# Patient Record
Sex: Female | Born: 1950 | Race: White | Hispanic: No | Marital: Married | Smoking: Former smoker
Health system: Southern US, Community
[De-identification: ages and names within clinical notes are randomized; demographics above are authoritative.]

## PROBLEM LIST (undated history)

## (undated) DIAGNOSIS — K811 Chronic cholecystitis: Secondary | ICD-10-CM

## (undated) DIAGNOSIS — E041 Nontoxic single thyroid nodule: Secondary | ICD-10-CM

## (undated) DIAGNOSIS — I499 Cardiac arrhythmia, unspecified: Secondary | ICD-10-CM

## (undated) DIAGNOSIS — E039 Hypothyroidism, unspecified: Secondary | ICD-10-CM

## (undated) DIAGNOSIS — I10 Essential (primary) hypertension: Secondary | ICD-10-CM

## (undated) DIAGNOSIS — E785 Hyperlipidemia, unspecified: Secondary | ICD-10-CM

## (undated) DIAGNOSIS — R7303 Prediabetes: Secondary | ICD-10-CM

## (undated) DIAGNOSIS — M1990 Unspecified osteoarthritis, unspecified site: Secondary | ICD-10-CM

## (undated) HISTORY — DX: Hyperlipidemia, unspecified: E78.5

## (undated) HISTORY — DX: Prediabetes: R73.03

## (undated) HISTORY — PX: OTHER SURGICAL HISTORY: SHX169

## (undated) HISTORY — PX: TUBAL LIGATION: SHX77

## (undated) HISTORY — PX: NASAL SINUS SURGERY: SHX719

## (undated) HISTORY — DX: Nontoxic single thyroid nodule: E04.1

## (undated) HISTORY — DX: Essential (primary) hypertension: I10

## (undated) HISTORY — DX: Hypothyroidism, unspecified: E03.9

## (undated) HISTORY — PX: REPLACEMENT TOTAL KNEE BILATERAL: SUR1225

---

## 1998-05-06 ENCOUNTER — Ambulatory Visit (HOSPITAL_COMMUNITY): Admission: RE | Admit: 1998-05-06 | Discharge: 1998-05-06 | Payer: Self-pay | Admitting: Obstetrics and Gynecology

## 1999-09-15 ENCOUNTER — Ambulatory Visit (HOSPITAL_COMMUNITY): Admission: RE | Admit: 1999-09-15 | Discharge: 1999-09-15 | Payer: Self-pay | Admitting: *Deleted

## 1999-10-13 ENCOUNTER — Encounter: Payer: Self-pay | Admitting: Family Medicine

## 1999-10-13 ENCOUNTER — Encounter: Admission: RE | Admit: 1999-10-13 | Discharge: 1999-10-13 | Payer: Self-pay | Admitting: Family Medicine

## 2000-10-18 ENCOUNTER — Encounter: Payer: Self-pay | Admitting: Family Medicine

## 2000-10-18 ENCOUNTER — Encounter: Admission: RE | Admit: 2000-10-18 | Discharge: 2000-10-18 | Payer: Self-pay | Admitting: Family Medicine

## 2001-07-17 ENCOUNTER — Other Ambulatory Visit: Admission: RE | Admit: 2001-07-17 | Discharge: 2001-07-17 | Payer: Self-pay | Admitting: *Deleted

## 2001-12-04 ENCOUNTER — Encounter: Payer: Self-pay | Admitting: *Deleted

## 2001-12-04 ENCOUNTER — Encounter: Admission: RE | Admit: 2001-12-04 | Discharge: 2001-12-04 | Payer: Self-pay | Admitting: *Deleted

## 2002-11-15 ENCOUNTER — Encounter: Payer: Self-pay | Admitting: Emergency Medicine

## 2002-11-15 ENCOUNTER — Inpatient Hospital Stay (HOSPITAL_COMMUNITY): Admission: EM | Admit: 2002-11-15 | Discharge: 2002-11-18 | Payer: Self-pay | Admitting: Emergency Medicine

## 2002-11-16 ENCOUNTER — Encounter: Payer: Self-pay | Admitting: Internal Medicine

## 2003-02-25 ENCOUNTER — Encounter: Payer: Self-pay | Admitting: Family Medicine

## 2003-02-25 ENCOUNTER — Encounter: Admission: RE | Admit: 2003-02-25 | Discharge: 2003-02-25 | Payer: Self-pay | Admitting: Family Medicine

## 2003-03-05 ENCOUNTER — Ambulatory Visit (HOSPITAL_COMMUNITY): Admission: RE | Admit: 2003-03-05 | Discharge: 2003-03-05 | Payer: Self-pay

## 2003-09-07 ENCOUNTER — Inpatient Hospital Stay (HOSPITAL_COMMUNITY): Admission: EM | Admit: 2003-09-07 | Discharge: 2003-09-09 | Payer: Self-pay | Admitting: Emergency Medicine

## 2004-12-12 ENCOUNTER — Encounter: Admission: RE | Admit: 2004-12-12 | Discharge: 2004-12-12 | Payer: Self-pay | Admitting: Family Medicine

## 2005-01-12 ENCOUNTER — Encounter: Admission: RE | Admit: 2005-01-12 | Discharge: 2005-01-12 | Payer: Self-pay | Admitting: Family Medicine

## 2005-02-01 ENCOUNTER — Ambulatory Visit (HOSPITAL_COMMUNITY): Admission: RE | Admit: 2005-02-01 | Discharge: 2005-02-01 | Payer: Self-pay | Admitting: Family Medicine

## 2005-02-01 ENCOUNTER — Encounter (INDEPENDENT_AMBULATORY_CARE_PROVIDER_SITE_OTHER): Payer: Self-pay | Admitting: *Deleted

## 2005-07-17 ENCOUNTER — Ambulatory Visit (HOSPITAL_COMMUNITY): Admission: RE | Admit: 2005-07-17 | Discharge: 2005-07-17 | Payer: Self-pay | Admitting: Surgery

## 2006-02-05 ENCOUNTER — Encounter: Admission: RE | Admit: 2006-02-05 | Discharge: 2006-02-05 | Payer: Self-pay | Admitting: Internal Medicine

## 2006-08-08 ENCOUNTER — Other Ambulatory Visit: Admission: RE | Admit: 2006-08-08 | Discharge: 2006-08-08 | Payer: Self-pay | Admitting: Internal Medicine

## 2006-10-03 ENCOUNTER — Ambulatory Visit (HOSPITAL_COMMUNITY): Admission: RE | Admit: 2006-10-03 | Discharge: 2006-10-03 | Payer: Self-pay

## 2007-09-03 ENCOUNTER — Ambulatory Visit (HOSPITAL_COMMUNITY): Admission: RE | Admit: 2007-09-03 | Discharge: 2007-09-03 | Payer: Self-pay | Admitting: Internal Medicine

## 2007-12-15 ENCOUNTER — Emergency Department (HOSPITAL_COMMUNITY): Admission: EM | Admit: 2007-12-15 | Discharge: 2007-12-16 | Payer: Self-pay | Admitting: Emergency Medicine

## 2008-01-20 ENCOUNTER — Ambulatory Visit (HOSPITAL_COMMUNITY): Admission: RE | Admit: 2008-01-20 | Discharge: 2008-01-20 | Payer: Self-pay | Admitting: Internal Medicine

## 2008-02-03 ENCOUNTER — Encounter: Admission: RE | Admit: 2008-02-03 | Discharge: 2008-02-03 | Payer: Self-pay | Admitting: Internal Medicine

## 2008-04-12 IMAGING — US US SOFT TISSUE HEAD/NECK
1 series · 14 of 25 positions shown · non-contrast
Comparison: 01/21/2008 and earlier studies

CLINICAL DATA: Solitary right thyroid nodule, status post biopsy on
02/01/2005 with benign results.  Recent thyroid scintigraphy showed
cold defect corresponding to the lesion.

THYROID ULTRASOUND
TECHNIQUE: Ultrasound examination of the thyroid gland and adjacent
soft tissues was performed.

[Series 1: us soft tissue head/neck · 0.09mm/px · 14 of 44 slices shown]
[im 1/44]
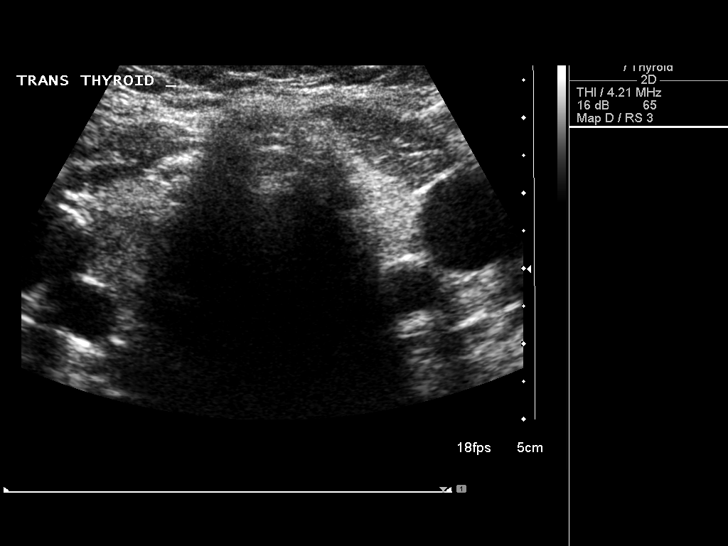
[im 4/44]
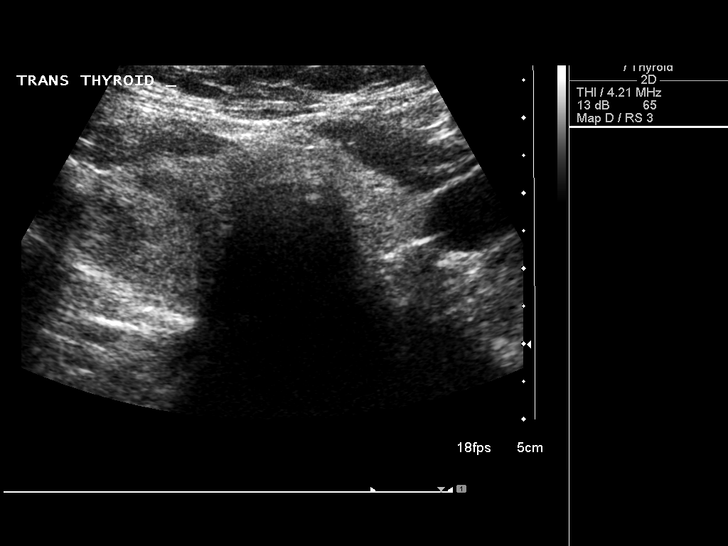
[im 8/44]
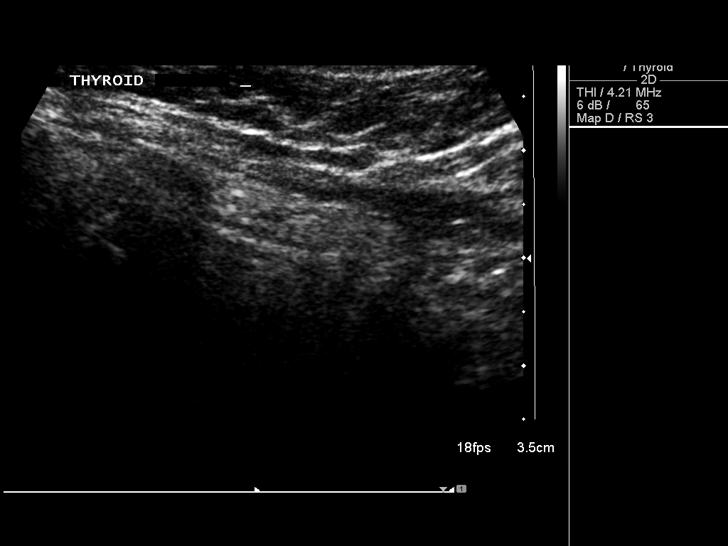
[im 11/44]
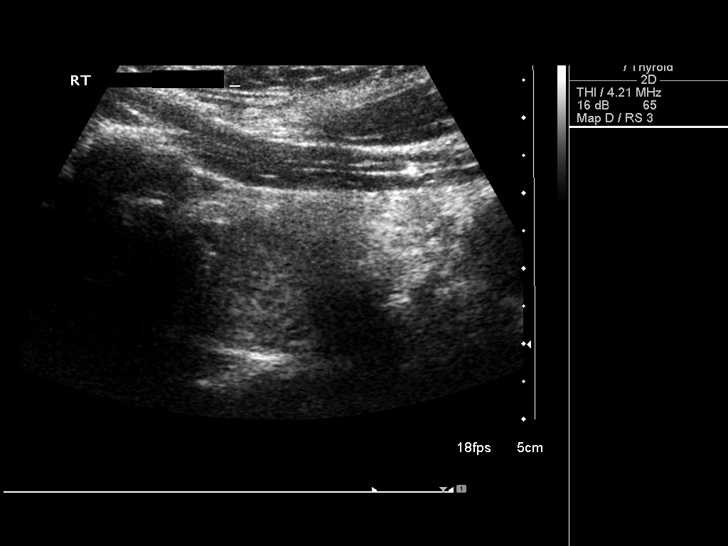
[im 15/44]
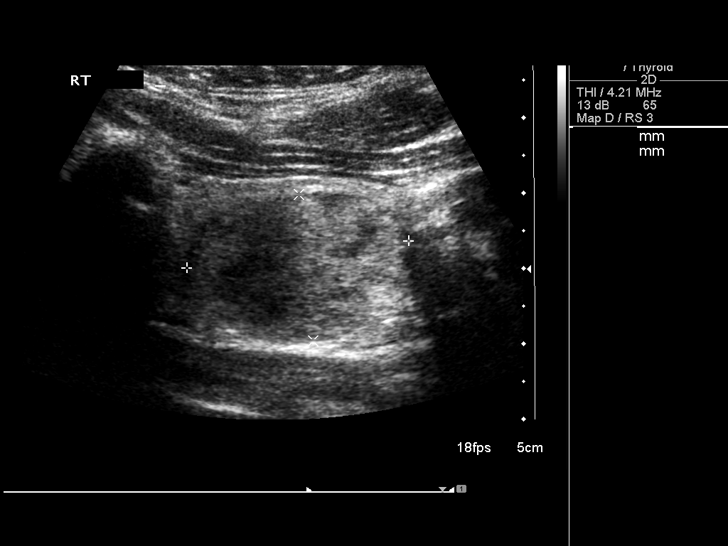
[im 17/44]
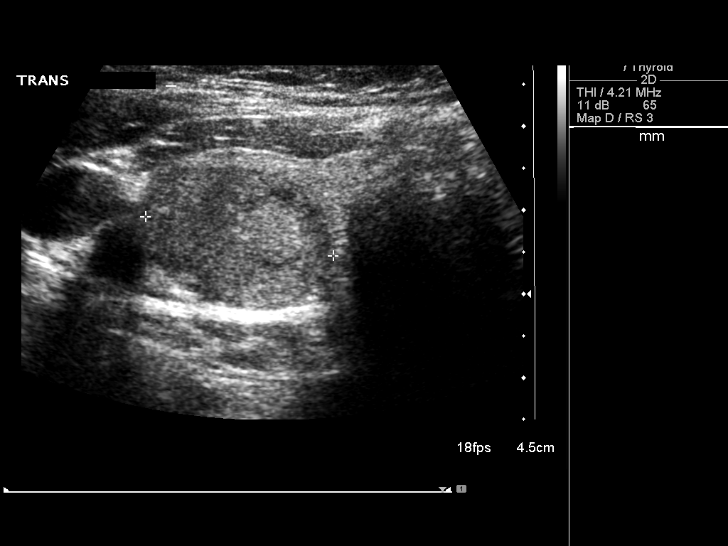
[im 20/44]
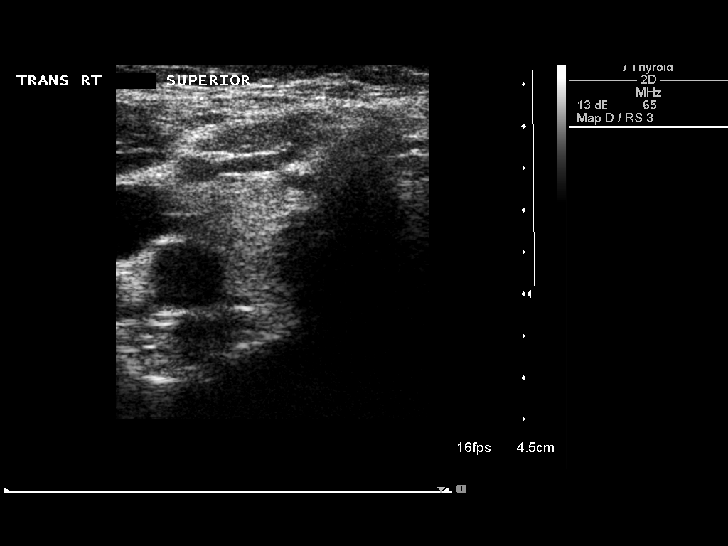
[im 24/44]
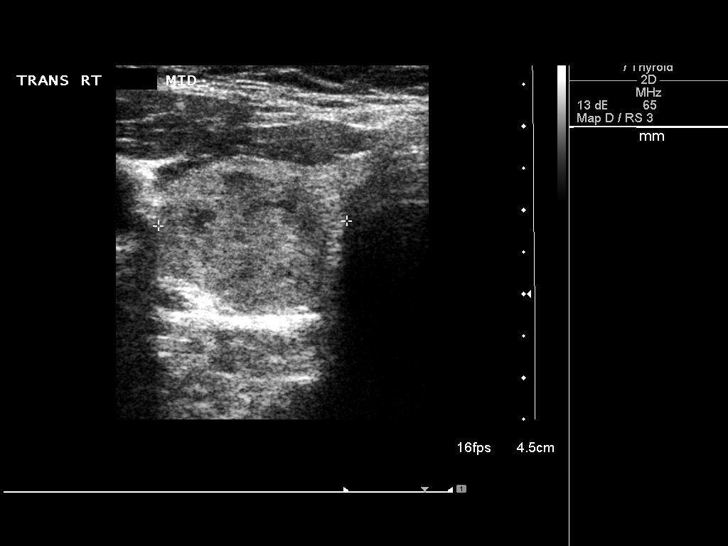
[im 27/44]
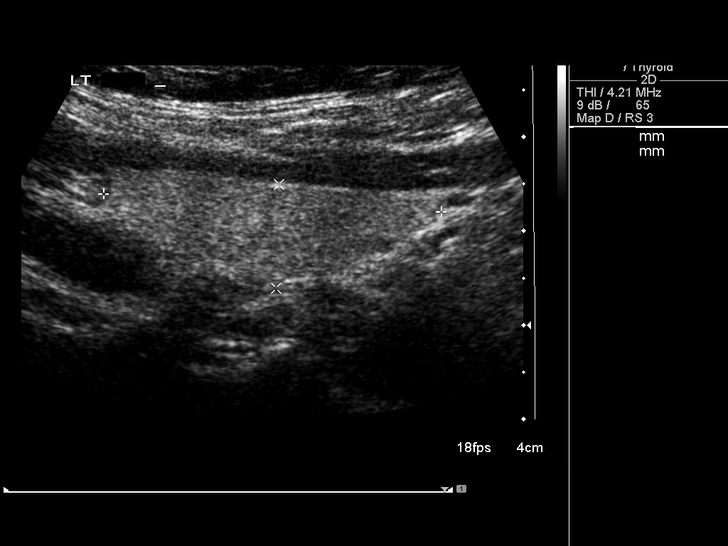
[im 29/44]
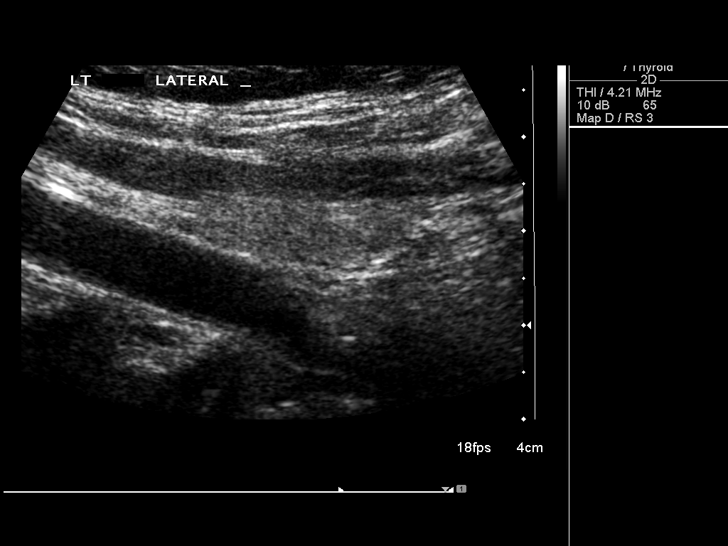
[im 33/44]
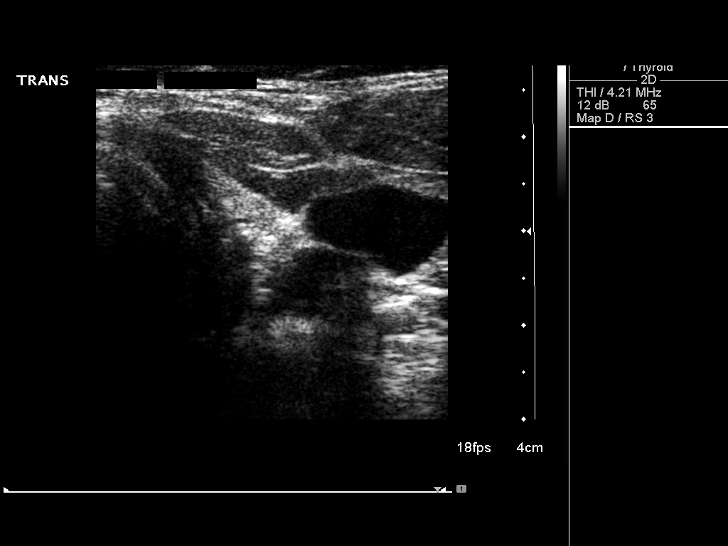
[im 36/44]
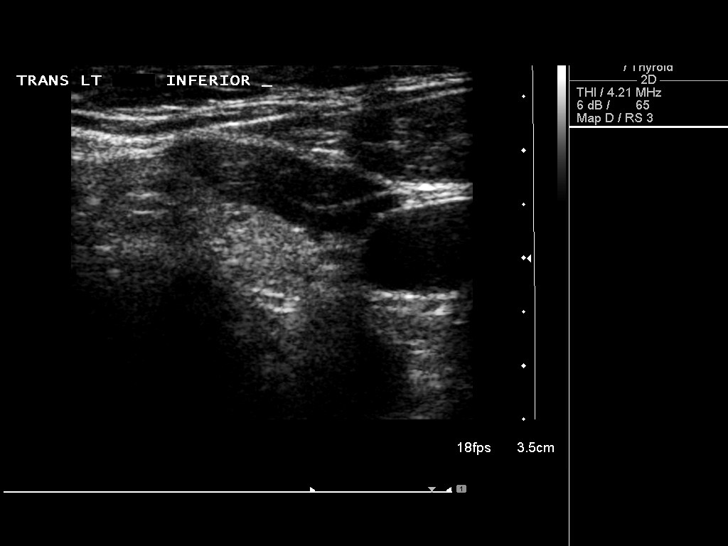
[im 40/44]
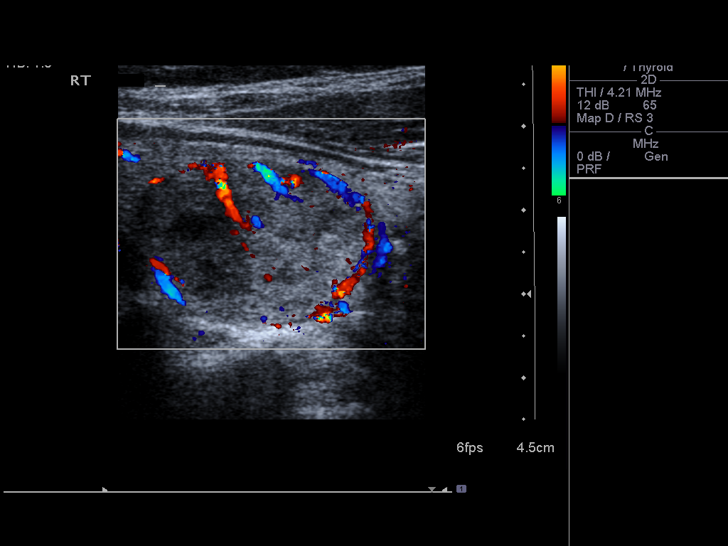
[im 44/44]
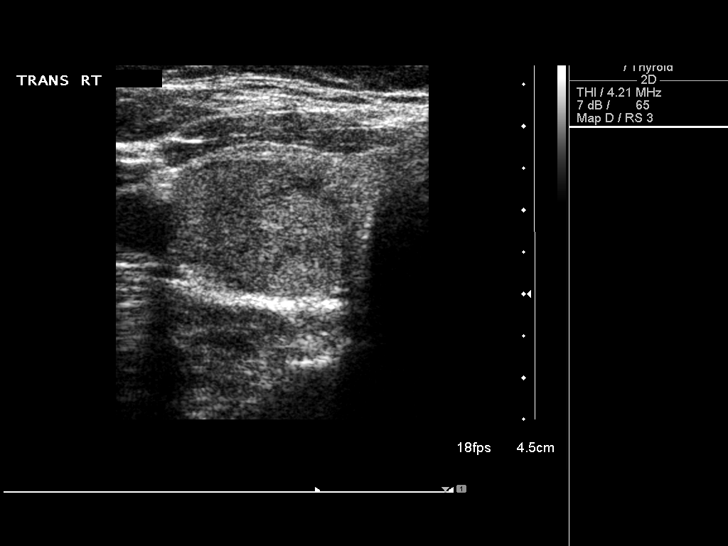

[14 of 25 positions shown; findings below may reference images not displayed]

FINDINGS: The right lobe measures 22 x 23 x 43 mm, with a
hypoechoic solid appearing 18 x 21 x 27 mm nodule in its inferior
pole. Left lobe 11 x 12 x 36 mm, homogeneous echotexture without
focal lesion.  Isthmus 3.7 mm in thickness.
IMPRESSION: 1.  2.7 cm right thyroid nodule, without increase in size since
previous studies.

## 2008-09-13 ENCOUNTER — Ambulatory Visit: Payer: Self-pay | Admitting: Cardiology

## 2008-09-27 ENCOUNTER — Ambulatory Visit: Payer: Self-pay

## 2008-09-27 ENCOUNTER — Encounter: Payer: Self-pay | Admitting: Cardiology

## 2008-11-18 ENCOUNTER — Inpatient Hospital Stay (HOSPITAL_COMMUNITY): Admission: RE | Admit: 2008-11-18 | Discharge: 2008-11-21 | Payer: Self-pay | Admitting: Orthopaedic Surgery

## 2009-01-09 ENCOUNTER — Emergency Department (HOSPITAL_COMMUNITY): Admission: EM | Admit: 2009-01-09 | Discharge: 2009-01-09 | Payer: Self-pay | Admitting: Emergency Medicine

## 2009-11-03 ENCOUNTER — Inpatient Hospital Stay (HOSPITAL_COMMUNITY): Admission: RE | Admit: 2009-11-03 | Discharge: 2009-11-06 | Payer: Self-pay | Admitting: Orthopaedic Surgery

## 2010-01-25 ENCOUNTER — Ambulatory Visit (HOSPITAL_COMMUNITY): Admission: RE | Admit: 2010-01-25 | Discharge: 2010-01-25 | Payer: Self-pay | Admitting: Internal Medicine

## 2010-09-26 ENCOUNTER — Ambulatory Visit: Payer: Self-pay | Admitting: Internal Medicine

## 2010-09-26 DIAGNOSIS — R51 Headache: Secondary | ICD-10-CM

## 2010-09-26 DIAGNOSIS — J328 Other chronic sinusitis: Secondary | ICD-10-CM

## 2010-09-26 DIAGNOSIS — R519 Headache, unspecified: Secondary | ICD-10-CM | POA: Insufficient documentation

## 2010-09-26 DIAGNOSIS — I10 Essential (primary) hypertension: Secondary | ICD-10-CM

## 2010-09-30 LAB — CONVERTED CEMR LAB: IgE (Immunoglobulin E), Serum: 37.8 intl units/mL (ref 0.0–180.0)

## 2010-11-27 ENCOUNTER — Ambulatory Visit (HOSPITAL_COMMUNITY)
Admission: RE | Admit: 2010-11-27 | Discharge: 2010-11-27 | Payer: Self-pay | Source: Home / Self Care | Attending: Internal Medicine | Admitting: Internal Medicine

## 2010-11-27 ENCOUNTER — Encounter: Payer: Self-pay | Admitting: Physician Assistant

## 2010-11-28 NOTE — Assessment & Plan Note (Signed)
Summary: allergy problem/ mbw   Vital Signs:  Patient profile:   60 year old female Height:      63.5 inches Weight:      258.13 pounds BMI:     45.17 O2 Sat:      96 % on Room air Pulse rate:   53 / minute BP sitting:   124 / 80  (left arm) Cuff size:   large  Vitals Entered By: Reynaldo Minium CMA (September 26, 2010 9:48 AM)  O2 Flow:  Room air CC: Allergy consult-Dr. Oneta Rack.   Primary Provider/Referring Provider:  Oneta Rack  CC:  Allergy consult-Dr. Oneta Rack.Marland Kitchen  History of Present Illness: September 26, 2010- 59 yoF seen on kind referral by Dr Oneta Rack about persistent sinus complaints. Remote smoking hx. Says she has had sinus problems x 30 years and she now wonders if there is an allergy mechanisim. Gets frontal pressure, light headed, frontal and maxillary ache, postnasal drip, some sneeze. Little discharge. Eyes get tender, ears ache. Symptoms flare approximately monthly, but not related to hormone cyccles and not seasonal. Denies colds and not much cough or sore throat. Has been given Z pak twice in last few weeks. Remote sinus surgery by Dr Arletha Grippe. No defined triggers. "Tough" of asthma in past, not treated. Now minor occasional wheeze or tightness. No pneumonia.  Preventive Screening-Counseling & Management  Alcohol-Tobacco     Smoking Status: quit     Packs/Day: 0.75     Year Quit: 1980  Current Medications (verified): 1)  Vitamin D3 50000 Unit Caps (Cholecalciferol) .... Take 1 By Mouth On S,m,w,f,s 2)  Methocarbamol 750 Mg Tabs (Methocarbamol) .... Take 1 By Mouth Every 6 Hours As Needed Spasm 3)  Percocet 5-325 Mg Tabs (Oxycodone-Acetaminophen) .... Take 1-2 By Mouth Every 4 Hours As Needed Pain 4)  Ziac 5-6.25 Mg Tabs (Bisoprolol-Hydrochlorothiazide) .... Take 1/2 By Mouth Once Daily 5)  Lisinopril-Hydrochlorothiazide 20-12.5 Mg Tabs (Lisinopril-Hydrochlorothiazide) .... Take 1/2 By Mouth Once Daily 6)  Pravastatin Sodium 40 Mg Tabs (Pravastatin Sodium) .... Take 1 By  Mouth Once Daily 7)  Levothyroxine Sodium 100 Mcg Tabs (Levothyroxine Sodium) .... Take 1/2 By Mouth S,t,w,f,s and 1 By Mouth M,th 8)  Antacid Extra Strength 750 Mg Chew (Calcium Carbonate Antacid) .... Take 2 By Mouth Once Daily 9)  Potassium Gluconate 595 Mg Cr-Tabs (Potassium Gluconate) .... Take 1 By Mouth Once Daily 10)  Fish Oil 1000 Mg Caps (Omega-3 Fatty Acids) .... Take 1 By Mouth Once Daily 11)  Tylenol Pm Extra Strength 500-25 Mg Tabs (Diphenhydramine-Apap (Sleep)) .... Take 1 By Mouth At Bedtime As Needed 12)  Hair/skin/nails  Tabs (Multiple Vitamins-Minerals) .... Take 1 By Mouth Once Daily  Allergies (verified): 1)  ! 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  The patient denies shortness of breath at rest, productive cough, non-productive cough, coughing  up blood, loss of appetite, weight change, abdominal pain, sore throat, tooth/dental problems, sneezing, itching, ear ache, anxiety, depression, hand/feet swelling, joint stiffness or pain, rash, and fever.    Physical Exam  Additional Exam:  General: A/Ox3; pleasant and cooperative, NAD, overweight SKIN: no rash, lesions NODES: no lymphadenopathy HEENT: Mier/AT, EOM- WNL, Conjuctivae- clear, PERRLA, TM-WNL, Nose- shiney, pale mucosa., Throat- clear and wnl, Mallampati  II, dentures NECK: Supple w/ fair ROM, JVD- none, normal carotid impulses w/o bruits Thyroid- normal to palpation CHEST: Clear to P&A HEART: RRR, no m/g/r heard ABDOMEN: Soft and nl; nml bowel sounds; no organomegaly or masses noted UJW:JXBJ, nl pulses, no edema  NEURO: Grossly intact to observation      Impression & Recommendations:  Problem # 1:  RHINOSINUSITIS, 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 blockage and an inflammatory cascade. We will send allergy profile for IgE assessment and bring her back for skin testing.   Medications Added to Medication List This Visit: 1)  Vitamin D3 50000 Unit Caps (Cholecalciferol) .... Take 1 by mouth on s,m,w,f,s 2)  Methocarbamol 750 Mg Tabs (Methocarbamol) .... Take 1 by mouth every 6 hours as needed spasm 3)  Percocet 5-325 Mg Tabs (Oxycodone-acetaminophen) .... Take 1-2 by mouth every 4 hours as needed pain 4)  Ziac 5-6.25 Mg Tabs (Bisoprolol-hydrochlorothiazide) .... Take 1/2 by mouth once daily 5)  Lisinopril-hydrochlorothiazide 20-12.5 Mg Tabs (Lisinopril-hydrochlorothiazide) .... Take 1/2 by mouth once daily 6)  Pravastatin Sodium 40 Mg Tabs (Pravastatin sodium) .... Take 1 by mouth once daily 7)  Levothyroxine Sodium 100 Mcg Tabs (Levothyroxine sodium) .... Take 1/2 by mouth s,t,w,f,s and 1 by mouth  m,th 8)  Antacid Extra Strength 750 Mg Chew (Calcium carbonate antacid) .... Take 2 by mouth once daily 9)  Potassium Gluconate 595 Mg Cr-tabs (Potassium gluconate) .... Take 1 by mouth once daily 10)  Fish Oil 1000 Mg Caps (Omega-3 fatty acids) .... Take 1 by mouth once daily 11)  Tylenol Pm Extra Strength 500-25 Mg Tabs (Diphenhydramine-apap (sleep)) .... Take 1 by mouth at bedtime as needed 12)  Hair/skin/nails Tabs (Multiple vitamins-minerals) .... Take 1 by mouth once daily  Other Orders: Consultation Level IV (47829) T-All[facility] Profile [facility], [place], [place], [place], [place] [phone])  Patient Instructions: 1)  Return as able for allergy skin testing- Stop all antihistamines 3 days before skin testing, including cold and allergy meds, otc sleep and cough meds.  2)  Lab   Orders Added: 1)  Consultation Level IV [30865] 2)  T-All[facility] Profile [facility] [place], [place], [place], [place] [[id number]]

## 2010-11-29 ENCOUNTER — Encounter: Payer: Self-pay | Admitting: Internal Medicine

## 2010-11-29 ENCOUNTER — Ambulatory Visit: Admit: 2010-11-29 | Payer: Self-pay | Admitting: Internal Medicine

## 2010-11-29 ENCOUNTER — Ambulatory Visit (INDEPENDENT_AMBULATORY_CARE_PROVIDER_SITE_OTHER): Payer: Federal, State, Local not specified - PPO | Admitting: Internal Medicine

## 2010-11-29 DIAGNOSIS — J328 Other chronic sinusitis: Secondary | ICD-10-CM

## 2010-11-29 DIAGNOSIS — J309 Allergic rhinitis, unspecified: Secondary | ICD-10-CM

## 2010-12-06 NOTE — Assessment & Plan Note (Signed)
Summary: alt/kp   Vital Signs:  Patient profile:   60 year old female Height:      63.5 inches Weight:      262 pounds BMI:     45.85 O2 Sat:      96 % on Room air Pulse rate:   62 / minute BP sitting:   118 / 70  (left arm) Cuff size:   large  Vitals Entered By: Reynaldo Minium CMA (November 29, 2010 2:35 PM)  O2 Flow:  Room air CC: Allergy Skin Testing.   Primary Provider/Referring Provider:  Oneta Rack  CC:  Allergy Skin Testing.Marland Kitchen  History of Present Illness: History of Present Illness: September 26, 2010- 59 yoF seen on kind referral by Dr Oneta Rack about persistent sinus complaints. Remote smoking hx. Says she has had sinus problems x 30 years and she now wonders if there is an allergy mechanisim. Gets frontal pressure, light headed, frontal and maxillary ache, postnasal drip, some sneeze. Little discharge. Eyes get tender, ears ache. Symptoms flare approximately monthly, but not related to hormone cycles and not seasonal. Denies colds and not much cough or sore throat. Has been given Z pak twice in last few weeks. Remote sinus surgery by Dr Arletha Grippe. No defined triggers. "Tough" asthma in past, not treated. Now minor occasional wheeze or tightness. No pneumonia.  November 29, 2010- rhinosinusitis, ? allergic rhinitis Nurse-CC: Allergy Skin Testing. Allergy profile- Total IgE 37.8, no specific elevations Recent antibitoics for pneumonia per Dr Oneta Rack. She denies fever, purulence, sore throat or productive cough. Has not been taking cough med.  Skin test- Positive for common inhalants.     Preventive Screening-Counseling & Management  Alcohol-Tobacco     Smoking Status: quit     Packs/Day: 0.75     Year Started: 1960     Year Quit: 1980  Current Medications (verified): 1)  Vitamin D3 50000 Unit Caps (Cholecalciferol) .... Take 1 By Mouth On S,m,w,f,s 2)  Methocarbamol 750 Mg Tabs (Methocarbamol) .... Take 1 By Mouth Every 6 Hours As Needed Spasm 3)  Percocet 5-325 Mg Tabs  (Oxycodone-Acetaminophen) .... Take 1-2 By Mouth Every 4 Hours As Needed Pain 4)  Ziac 5-6.25 Mg Tabs (Bisoprolol-Hydrochlorothiazide) .... Take 1/2 By Mouth Once Daily 5)  Lisinopril-Hydrochlorothiazide 20-12.5 Mg Tabs (Lisinopril-Hydrochlorothiazide) .... Take 1/2 By Mouth Once Daily 6)  Pravastatin Sodium 40 Mg Tabs (Pravastatin Sodium) .... Take 1 By Mouth Once Daily 7)  Levothyroxine Sodium 100 Mcg Tabs (Levothyroxine Sodium) .... Take 1/2 By Mouth S,t,w,f,s and 1 By Mouth M,th 8)  Antacid Extra Strength 750 Mg Chew (Calcium Carbonate Antacid) .... Take 2 By Mouth Once Daily 9)  Potassium Gluconate 595 Mg Cr-Tabs (Potassium Gluconate) .... Take 1 By Mouth Once Daily 10)  Fish Oil 1000 Mg Caps (Omega-3 Fatty Acids) .... Take 1 By Mouth Once Daily 11)  Tylenol Pm Extra Strength 500-25 Mg Tabs (Diphenhydramine-Apap (Sleep)) .... Take 1 By Mouth At Bedtime As Needed 12)  Hair/skin/nails  Tabs (Multiple Vitamins-Minerals) .... Take 1 By Mouth Once Daily  Allergies (verified): 1)  ! 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  Risk Factors:  Smoking Status: quit (11/29/2010) Packs/Day: 0.75 (11/29/2010)  Past Medical History: Allergic Rhinitis- Allergy skin test positive 11/29/10 Rhinosinusitis Hypertension  Past Surgical History: Sinus Surgery Carpal Tunnel surgery-both hands Knee replacement-Both knees  Review of Systems      See HPI       The patient complains of non-productive cough and nasal congestion/difficulty breathing through nose.  The patient denies shortness of breath with activity, shortness of breath at rest, productive cough, coughing up blood, chest pain,  irregular heartbeats, acid heartburn, indigestion, loss of appetite, weight change, abdominal pain, difficulty swallowing, sore throat, tooth/dental problems, headaches, and sneezing.    Physical Exam  Additional Exam:  General: A/Ox3; pleasant and cooperative, NAD, overweight, looks comfortable SKIN: no rash, lesions NODES: no lymphadenopathy HEENT: Mier/AT, EOM- WNL, Conjuctivae- clear, PERRLA, TM-WNL, Nose-  pale mucosa., Throat- clear and wnl, Mallampati  II, dentures NECK: Supple w/ fair ROM, JVD- none, normal carotid impulses w/o bruits Thyroid- normal to palpation CHEST: Clear to P&A, no cough or wheeze HEART: RRR, no m/g/r heard ABDOMEN:obeswe ZOX:WRUE, nl pulses, no edema  NEURO: Grossly intact to observation      Impression & Recommendations:  Problem # 1:  ALLERGIC RHINITIS (ICD-477.9)  Skin test positives significant enough to indicate some symptoms at some times of year are likely allergic. She may have significant chronic rhinosinusitis from other causes, including recurrent infections. She may need a limited CT sinus.  We discussed treatment options, including decongestants and Neti pot rinse. There may be a role after nasal steroids, to try allergy vaccine. We discussed this carefully, including goals, real risks, realistic expectations.   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   Other Orders: Est. Patient Level III (45409) Allergy Puncture Test (81191) Allergy I.D Test (47829)  Patient Instructions: 1)  Please schedule a follow-up appointment in 2 months. 2)  OK to continue present treatment. If you decide that you want to try allergy shots, ,please let us know.    Orders Added: 1)  Est. Patient Level III [56213] 2)  Allergy Puncture Test [95004] 3)  Allergy I.D Test [08657]

## 2010-12-14 NOTE — Miscellaneous (Signed)
Summary: Intradermal Tests/[facility]  Intradermal Tests/ Allergy   Imported By: Lester Pinal 12/04/2010 10:49:35  _____________________________________________________________________  External Attachment:    Type:   Image     Comment:   External Document

## 2010-12-20 ENCOUNTER — Other Ambulatory Visit (HOSPITAL_COMMUNITY)
Admission: RE | Admit: 2010-12-20 | Discharge: 2010-12-20 | Disposition: A | Discharge status: Home / Self Care | Payer: Federal, State, Local not specified - PPO | Source: Ambulatory Visit | Attending: Internal Medicine | Admitting: Internal Medicine

## 2010-12-20 ENCOUNTER — Encounter: Payer: Self-pay | Admitting: Physician Assistant

## 2010-12-20 ENCOUNTER — Other Ambulatory Visit: Payer: Self-pay | Admitting: Internal Medicine

## 2010-12-20 DIAGNOSIS — Z01419 Encounter for gynecological examination (general) (routine) without abnormal findings: Secondary | ICD-10-CM | POA: Insufficient documentation

## 2010-12-28 ENCOUNTER — Other Ambulatory Visit (HOSPITAL_COMMUNITY): Payer: Self-pay | Admitting: Internal Medicine

## 2011-01-08 ENCOUNTER — Other Ambulatory Visit: Payer: Self-pay | Admitting: Internal Medicine

## 2011-01-08 ENCOUNTER — Encounter: Payer: Self-pay | Admitting: Internal Medicine

## 2011-01-08 ENCOUNTER — Ambulatory Visit
Admission: RE | Admit: 2011-01-08 | Discharge: 2011-01-08 | Disposition: A | Discharge status: Home / Self Care | Payer: Federal, State, Local not specified - PPO | Source: Ambulatory Visit | Attending: Internal Medicine | Admitting: Internal Medicine

## 2011-01-08 DIAGNOSIS — R0602 Shortness of breath: Secondary | ICD-10-CM

## 2011-01-08 MED ORDER — IOHEXOL 300 MG/ML  SOLN
125.0000 mL | Freq: Once | INTRAMUSCULAR | Status: AC | PRN
  Administered 2011-01-08: 125 mL via INTRAVENOUS

## 2011-01-10 ENCOUNTER — Encounter: Payer: Self-pay | Admitting: Physician Assistant

## 2011-01-10 ENCOUNTER — Ambulatory Visit (INDEPENDENT_AMBULATORY_CARE_PROVIDER_SITE_OTHER): Payer: Federal, State, Local not specified - PPO | Admitting: Physician Assistant

## 2011-01-10 DIAGNOSIS — R079 Chest pain, unspecified: Secondary | ICD-10-CM

## 2011-01-10 DIAGNOSIS — R42 Dizziness and giddiness: Secondary | ICD-10-CM

## 2011-01-10 DIAGNOSIS — K219 Gastro-esophageal reflux disease without esophagitis: Secondary | ICD-10-CM | POA: Insufficient documentation

## 2011-01-10 DIAGNOSIS — E039 Hypothyroidism, unspecified: Secondary | ICD-10-CM | POA: Insufficient documentation

## 2011-01-10 DIAGNOSIS — R002 Palpitations: Secondary | ICD-10-CM

## 2011-01-10 DIAGNOSIS — E785 Hyperlipidemia, unspecified: Secondary | ICD-10-CM | POA: Insufficient documentation

## 2011-01-10 NOTE — Progress Notes (Signed)
History of Present Illness: Primary Cardiologist:  Dr. Rollene Rotunda  Tracy Paul is a 60 yo female who was evaluated by Dr. Antoine Poche in 2009 for dyspnea prior to knee surgery.  An echocardiogram done at that time demonstrated normal LV function with an EF of 60% and left atrial enlargement.  A BNP was checked and this was also normal.  She was cleared for surgery at that time.  She has a history of hypertension, hyperlipidemia, hypothyroidism and thyroid nodule.  According to prior hospital records, she's had cardiac catheterization twice in the past without evidence of coronary disease.  Apparently the last cath was done in 2000.  I have no record of this.  Over the last one to 2 weeks she's developed some chest discomfort and shortness of breath.  This is a sharp pain that is substernal.  It comes on at rest.  It lasts maybe a minute or less.  She feels it in her left arm at times.  She denies any associated nausea or diaphoresis.  She does feel short of breath.  She describes what sounds like wheezing at times as well with her chest pain.  She notes dyspnea with exertion.  She describes NYHA class II symptoms.  Over time, this seems to have gotten worse.  She denies orthopnea, PND.  She does have some mild pedal edema.  She saw her primary care provider who set her up with a chest CT.  This was negative for pulmonary embolism.  Past Medical History  Diagnosis Date  . Hypertension   . Hyperlipidemia   . Hypothyroidism   . Thyroid nodule   . GERD (gastroesophageal reflux disease)   . Chest pain     History of normal cardiac catheterization x2; last heart catheter 2000; echocardiogram November 2009: EF 60%; LAE    Current Outpatient Prescriptions  Medication Sig Dispense Refill  . calcium carbonate (TUMS EX) 750 MG chewable tablet Chew 2 tablets by mouth daily.        . Cholecalciferol (VITAMIN D3) 50000 UNITS CAPS Take by mouth. I take one by mouth on Sunday, Monday, Wednesday, Friday,  Saturday       . diphenhydramine-acetaminophen (TYLENOL PM) 25-500 MG TABS Take 1 tablet by mouth at bedtime as needed.        . fish oil-omega-3 fatty acids 1000 MG capsule Take 1 g by mouth daily.        Marland Kitchen levothyroxine (SYNTHROID, LEVOTHROID) 100 MCG tablet Take one half tablet by mouth on Sunday, Tuesday, Wednesday, Friday, Saturday and one tablet on Mondays and Thursdays       . lisinopril-hydrochlorothiazide (PRINZIDE,ZESTORETIC) 20-25 MG per tablet Take 1 tablet by mouth daily.        . Multiple Vitamins-Minerals (MULTIVITAMIN WITH MINERALS) tablet Take 1 tablet by mouth daily.        Marland Kitchen oxyCODONE-acetaminophen (PERCOCET) 5-325 MG per tablet Take 1 tablet by mouth every 4 (four) hours as needed.        . potassium gluconate 595 MG TABS Take 595 mg by mouth daily.        . pravastatin (PRAVACHOL) 40 MG tablet Take 40 mg by mouth daily.          Allergies  Allergen Reactions  . Codeine     Vital Signs: BP 118/76  Pulse 65  Resp 15  Ht 5' 3.5" (1.613 m)  Wt 256 lb 12 oz (116.461 kg)  BMI 44.77 kg/m2  PHYSICAL EXAM: General:  Well developed, well  nourished, in no acute distress. Head:  normocephalic and atraumatic Eyes:  PERRLA/EOM intact; conjunctiva and lids normal. Nose:  no deformity Neck:  Neck supple, no JVD. No masses, thyromegaly or abnormal cervical nodes. Lungs:  Clear bilaterally to auscultation and percussion No wheezing No rales Heart:  Normal S1-S2 Regular rate and rhythm No murmur No gallop Abdomen:  Bowel sounds positive; abdomen soft and non-tender without masses, organomegaly Msk:  Back normal, normal gait. Muscle strength and tone normal. Pulses:  DP/PT 2+ bilaterally Extremities:  No edema Mild varicosities noted bilaterally extremities Neurologic:  Alert and oriented x 3. Cranial nerves 2 through 12 grossly intact Skin:  warm and dry Psych:  Normal affect.  EKG: Normal Sinus Rhythm Heart rate 65 Normal axis Poor R-wave progression

## 2011-01-10 NOTE — Assessment & Plan Note (Signed)
Check a Holter monitor as above.  We will also obtain her recent lab results from her primary care provider.

## 2011-01-10 NOTE — Assessment & Plan Note (Signed)
We will assess her LV function with her Myoview study.  Will also check a 24-hour Holter.

## 2011-01-10 NOTE — Assessment & Plan Note (Signed)
She had a CT scan done recently that was negative for pulmonary embolism.  Her symptoms of chest pain are atypical for ischemia.  She does have significant cardiac risk factors.  We discussed proceeding with treadmill testing.  However, she is unable to walk on the treadmill due to problems with exercise intolerance and knee pain.  I will arrange a LEXISCAN Myoview study to rule out ischemic heart disease.  Other possibilities for her chest pain include acid reflux disease, asthma, musculoskeletal chest pain and anxiety.  I have asked her to take her Pepcid AC twice a day for 2 weeks.  If her cardiac workup is negative, I would consider possible further GI workup versus pulmonary workup.  She will followup in the next several weeks.

## 2011-01-14 LAB — CBC
HCT: 29.6 % — ABNORMAL LOW (ref 36.0–46.0)
HCT: 30.7 % — ABNORMAL LOW (ref 36.0–46.0)
Hemoglobin: 10.1 g/dL — ABNORMAL LOW (ref 12.0–15.0)
Hemoglobin: 10.2 g/dL — ABNORMAL LOW (ref 12.0–15.0)
Hemoglobin: 10.8 g/dL — ABNORMAL LOW (ref 12.0–15.0)
MCHC: 34.4 g/dL (ref 30.0–36.0)
MCHC: 34.6 g/dL (ref 30.0–36.0)
MCHC: 34.6 g/dL (ref 30.0–36.0)
MCHC: 35.1 g/dL (ref 30.0–36.0)
MCV: 92 fL (ref 78.0–100.0)
MCV: 92.1 fL (ref 78.0–100.0)
MCV: 92.7 fL (ref 78.0–100.0)
MCV: 92.9 fL (ref 78.0–100.0)
Platelets: 152 10*3/uL (ref 150–400)
Platelets: 160 10*3/uL (ref 150–400)
Platelets: 232 10*3/uL (ref 150–400)
RBC: 3.14 MIL/uL — ABNORMAL LOW (ref 3.87–5.11)
RBC: 3.2 MIL/uL — ABNORMAL LOW (ref 3.87–5.11)
RBC: 3.34 MIL/uL — ABNORMAL LOW (ref 3.87–5.11)
RBC: 4.13 MIL/uL (ref 3.87–5.11)
RDW: 13.8 % (ref 11.5–15.5)
RDW: 13.9 % (ref 11.5–15.5)
WBC: 8.8 10*3/uL (ref 4.0–10.5)
WBC: 9.3 10*3/uL (ref 4.0–10.5)

## 2011-01-14 LAB — URINALYSIS, MICROSCOPIC ONLY
Hgb urine dipstick: NEGATIVE
Nitrite: NEGATIVE
Protein, ur: 30 mg/dL — AB
Specific Gravity, Urine: 1.038 — ABNORMAL HIGH (ref 1.005–1.030)
Urobilinogen, UA: 0.2 mg/dL (ref 0.0–1.0)

## 2011-01-14 LAB — BASIC METABOLIC PANEL
BUN: 12 mg/dL (ref 6–23)
BUN: 6 mg/dL (ref 6–23)
BUN: 8 mg/dL (ref 6–23)
CO2: 27 mEq/L (ref 19–32)
CO2: 28 mEq/L (ref 19–32)
CO2: 29 mEq/L (ref 19–32)
CO2: 30 mEq/L (ref 19–32)
Calcium: 8.3 mg/dL — ABNORMAL LOW (ref 8.4–10.5)
Calcium: 8.5 mg/dL (ref 8.4–10.5)
Calcium: 9.4 mg/dL (ref 8.4–10.5)
Calcium: 9.9 mg/dL (ref 8.4–10.5)
Chloride: 100 mEq/L (ref 96–112)
Chloride: 104 mEq/L (ref 96–112)
Chloride: 99 mEq/L (ref 96–112)
Creatinine, Ser: 0.7 mg/dL (ref 0.4–1.2)
Creatinine, Ser: 0.7 mg/dL (ref 0.4–1.2)
Creatinine, Ser: 0.85 mg/dL (ref 0.4–1.2)
Creatinine, Ser: 1.08 mg/dL (ref 0.4–1.2)
GFR calc Af Amer: >60 mL/min (ref >60)
GFR calc Af Amer: >60 mL/min (ref >60)
GFR calc Af Amer: >60 mL/min (ref >60)
GFR calc Af Amer: >60 mL/min (ref >60)
GFR calc non Af Amer: >60 mL/min (ref >60)
GFR calc non Af Amer: >60 mL/min (ref >60)
GFR calc non Af Amer: >60 mL/min (ref >60)
Glucose, Bld: 102 mg/dL — ABNORMAL HIGH (ref 70–99)
Glucose, Bld: 114 mg/dL — ABNORMAL HIGH (ref 70–99)
Glucose, Bld: 124 mg/dL — ABNORMAL HIGH (ref 70–99)
Potassium: 3.5 mEq/L (ref 3.5–5.1)
Potassium: 3.5 mEq/L (ref 3.5–5.1)
Potassium: 3.7 mEq/L (ref 3.5–5.1)
Potassium: 4 mEq/L (ref 3.5–5.1)
Sodium: 135 mEq/L (ref 135–145)
Sodium: 136 mEq/L (ref 135–145)
Sodium: 137 mEq/L (ref 135–145)
Sodium: 139 mEq/L (ref 135–145)

## 2011-01-14 LAB — PROTIME-INR
INR: 1.06 (ref 0.00–1.49)
INR: 1.08 (ref 0.00–1.49)
INR: 1.23 (ref 0.00–1.49)
INR: 1.36 (ref 0.00–1.49)
INR: 1.44 (ref 0.00–1.49)
Prothrombin Time: 13.7 seconds (ref 11.6–15.2)
Prothrombin Time: 13.9 seconds (ref 11.6–15.2)
Prothrombin Time: 15.4 seconds — ABNORMAL HIGH (ref 11.6–15.2)
Prothrombin Time: 16.7 seconds — ABNORMAL HIGH (ref 11.6–15.2)
Prothrombin Time: 17.4 seconds — ABNORMAL HIGH (ref 11.6–15.2)

## 2011-01-16 NOTE — Assessment & Plan Note (Signed)
Summary: Please see noted done in [facility] that is scanned in.   Visit Type:  Follow-up Primary Provider:  Oneta Rack  CC:  shortness of breath, chest pain goes into her back, and dizziness.  History of Present Illness: Please see noted done in [facility] that is scanned in.   Current Medications (verified): 1)  Vitamin D3 50000 Unit Caps (Cholecalciferol) .... Take 1 By Mouth On S,m,w,f,s 2)  Percocet 5-325 Mg Tabs (Oxycodone-Acetaminophen) .... Take 1-2 By Mouth Every 4 Hours As Needed Pain 3)  Pravastatin Sodium 40 Mg Tabs (Pravastatin Sodium) .... Take 1 By Mouth Once Daily 4)  Levothyroxine Sodium 100 Mcg Tabs (Levothyroxine Sodium) .... Take 1/2 By Mouth S,t,w,f,s and 1 By Mouth M,th 5)  Antacid Extra Strength 750 Mg Chew (Calcium Carbonate Antacid) .Marland Kitchen.. 1 Tab Two Times A Day For 2 Weeks Then Take As Needed 6)  Potassium Gluconate 595 Mg Cr-Tabs (Potassium Gluconate) .... Take 1 By Mouth Once Daily 7)  Fish Oil 1000 Mg Caps (Omega-3 Fatty Acids) .... Take 1 By Mouth Once Daily 8)  Tylenol Pm Extra Strength 500-25 Mg Tabs (Diphenhydramine-Apap (Sleep)) .... Take 1 By Mouth At Bedtime As Needed 9)  Hair/skin/nails  Tabs (Multiple Vitamins-Minerals) .... Take 1 By Mouth Once Daily 10)  Zestoretic 20-25 Mg Tabs (Lisinopril-Hydrochlorothiazide) .Marland Kitchen.. 1 Tab Once Daily 11)  Pepcid Ac Maximum Strength 20 Mg Tabs (Famotidine) .Marland Kitchen.. 1 Tab Two Times A Day For 2 Weeks Then As Needed  Allergies (verified): 1)  ! Codeine  Past History:  Past Medical History: Allergic Rhinitis- Allergy skin test positive 11/29/10 Rhinosinusitis Hypertension Hyperlipidemia Hypothyroidism Thyroid nodule GERD History of normal cardiac catheterization x2 in the past Echocardiogram November 2009: EF 60%; LAE CT angiogram of the chest 01/08/11: Negative for pulmonary embolism  Past Surgical History: Reviewed history from 11/29/2010 and no changes required. Sinus Surgery Carpal Tunnel surgery-both  hands Knee replacement-Both knees  Family History: Reviewed history from 09/26/2010 and no changes required. Heart Disease: Father-MI(several and smoker)-deceased                          Sister-living age 90-MI  Mother- died cerebral aneurysm w/ hx breast cancer  Social History: Reviewed history from 09/26/2010 and no changes required. 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  Review of Systems       She had problems with gastroenteritis twice over the last several weeks.  The symptoms have resolved.  She has occasional dyspepsia.  She also has occasional difficulty with swallowing and it sounds somewhat consistent with dysphagia.  She denies melena or hematochezia. She denies cough, fevers or chills.  Otherwise, as per  the HPI.  All other systems reviewed and negative.   Vital Signs:  Patient profile:   60 year old female Height:      63.5 inches Weight:      256.75 pounds BMI:     44.93 Pulse rate:   65 / minute BP sitting:   118 / 76  (left arm)  Vitals Entered By: Caralee Ates CMA (January 10, 2011 9:43 AM)   EKG  Procedure date:  01/10/2011  Findings:      Normal Sinus Rhythm Heart rate 65 Normal axis Poor R-wave progression Nonspecific ST-T wave changes  Impression & Recommendations:  Problem # 1:  CHEST PAIN UNSPECIFIED (ICD-786.50) Orders: Nuclear Stress Test (Nuc Stress Test)  Problem # 2:  DIZZINESS (ICD-780.4) Orders:  Holter Monitor (Holter Monitor)  Problem # 3:  PALPITATIONS (ICD-785.1) Orders: Holter Monitor (Holter Monitor) Nuclear Stress Test (Nuc Stress Test)  Patient Instructions: 1)  Your physician recommends that you schedule a follow-up appointment in: 3-4 WEEKS WITH DR. HOCHREIN IF NOT AVAILABLE OK THEN TO SCHEDULE WITH Momo Braun, PA-C ON SAME DAY DR. Antoine Poche IS IN THE OFFICE. 2)  Your physician has recommended that you wear a 24 HOUR holter monitor.  Holter monitors are medical devices  that record the heart's electrical activity. Doctors most often use these monitors to diagnose arrhythmias. Arrhythmias are problems with the speed or rhythm of the heartbeat. The monitor is a small, portable device. You can wear one while you do your normal daily activities. This is usually used to diagnose what is causing palpitations/syncope (passing out). 3)  Your physician has requested that you have an LEXISCAN myoview.  For further information please visit [email or web address]  Please follow instruction sheet, as given.

## 2011-01-18 ENCOUNTER — Encounter: Payer: Self-pay | Admitting: Physician Assistant

## 2011-01-24 ENCOUNTER — Ambulatory Visit (HOSPITAL_COMMUNITY): Payer: Federal, State, Local not specified - PPO | Attending: Cardiology | Admitting: Radiology

## 2011-01-24 ENCOUNTER — Encounter (INDEPENDENT_AMBULATORY_CARE_PROVIDER_SITE_OTHER): Payer: Federal, State, Local not specified - PPO

## 2011-01-24 DIAGNOSIS — R0602 Shortness of breath: Secondary | ICD-10-CM

## 2011-01-24 DIAGNOSIS — R002 Palpitations: Secondary | ICD-10-CM

## 2011-01-24 DIAGNOSIS — R079 Chest pain, unspecified: Secondary | ICD-10-CM

## 2011-01-25 ENCOUNTER — Ambulatory Visit (HOSPITAL_COMMUNITY): Payer: Federal, State, Local not specified - PPO | Attending: Cardiology | Admitting: Radiology

## 2011-01-25 VITALS — Ht 63.5 in | Wt 256.0 lb

## 2011-01-25 DIAGNOSIS — R079 Chest pain, unspecified: Secondary | ICD-10-CM

## 2011-01-25 DIAGNOSIS — R0789 Other chest pain: Secondary | ICD-10-CM

## 2011-01-25 MED ORDER — TECHNETIUM TC 99M TETROFOSMIN IV KIT
30.0000 | PACK | Freq: Once | INTRAVENOUS | Status: AC | PRN
  Administered 2011-01-24: 30 via INTRAVENOUS

## 2011-01-25 MED ORDER — TECHNETIUM TC 99M TETROFOSMIN IV KIT
33.0000 | PACK | Freq: Once | INTRAVENOUS | Status: AC | PRN
  Administered 2011-01-25: 33 via INTRAVENOUS

## 2011-01-25 MED ORDER — REGADENOSON 0.4 MG/5ML IV SOLN
0.4000 mg | Freq: Once | INTRAVENOUS | Status: AC
  Administered 2011-01-25: 0.4 mg via INTRAVENOUS

## 2011-01-25 NOTE — Progress Notes (Signed)
[facility] SITE 3 NUCLEAR MED [address] [place] [address] [phone]  Cardiology Nuclear Med Study Tracy Paul female 04/28/1951   Nuclear Med Background Indication for Stress Test:  Evaluation for Ischemia History: 03/12 CT/MRI (-) PE  and '00 Heart Catheterization NL Cardiac Risk Factors: Family History - CAD, Hypertension and Lipids  Symptoms:  Chest Pain (last date of chest pain 01/24/11), Dizziness, Fatigue, Fatigue with Exertion, Nausea, Palpitations and SOB   Nuclear Pre-Procedure Caffeine/Decaff Intake:  none NPO After: 5:45 pm   Lungs: clear IV 0.9% NS with Angio Cath:  20g  IV Site: R Antecubital  IV Started by:  Milana Na, EMT-P  Chest Size (in): 40  Cup Size:  DD  Height: 5' 3.5" (1.613 m)  Weight:  256 lb (116.121 kg)  BMI:  Body mass index is 44.64 kg/(m^2). Tech Comments: Rx with water this am    Nuclear Med Study 1 or 2 day study: 2 day  Stress Test Type:  Eugenie Birks  Reading MD: Dietrich Pates, MD  Order Authorizing Provider:  J.Hochrein  Resting Radionuclide: Technetium 60m Tetrofosmin  Resting Radionuclide Dose: 33 mCi   Stress Radionuclide:  Technetium 14m Tetrofosmin  Stress Radionuclide Dose: 33 mCi           Stress Protocol Rest HR: 69 Stress HR: 99  Rest BP: 118/67 Stress BP: 106/57  Exercise Time:  N/A METS: N/A  Predicted HR: N/A % of Maximum: N/A    Predicted Max HR: 162 bpm % Max HR: 61.11 bpm Rate Pressure Product: 91478    Dose of Adenosine:  N/A Dose of Lexiscan:  0.4 mg  Dose of Atropine:  N/A Dose of Dobutamine: N/A  Stress Test Technologist: Milana Na, EMT-P  Nuclear Technologist:  Domenic Polite, CNMT     Rest Procedure:  Myocardial perfusion imaging was performed at rest 45 minutes following the intravenous administration of Technetium 78m Tetrofosmin. Rest ECG: NSR  Stress Procedure:  The patient received IV Lexiscan 0.4 mg over 15-seconds.  Technetium 75m Tetrofosmin injected at  30-seconds.  There were no significant changes and rare pacs/pvcs with Lexiscan.  Quantitative spect images were obtained after a 45 minute delay. Stress ECG: No significant change from baseline ECG  QPS Raw Data Images:  Soft tissue (diaphragm, breast) surround heart. Stress Images:  Normal homogeneous uptake in all areas of the myocardium. Rest Images:  Normal homogeneous uptake in all areas of the myocardium. Subtraction (SDS):  No evidence of ischemia.  Findings Risk Category:  Normal nuclear study. Clinically Abnormal:  No Ischemia:  No Fixed Defect:  No LV Dysfunction:  No Transient Ischemic Dilatation (Normal <1.22):  1.22 Lung/Heart Ratio (Normal <0.45):  .42  Quantitative Gated Spect Images QGS EDV:  113 ml QGS ESV:  45 ml QGS cine images:  **Normal wall motion. QGS EF:  60%  Impression Exercise Capacity:  Lexiscan with no exercise. BP Response:  Normal blood pressure response. Clinical Symptoms:  No chest pain. ECG Impression:  No significant ST segment change suggestive of ischemia. Comparison with Prior Nuclear Study: No previous nuclear study performed  Overall Impression:  Normal stress nuclear study.

## 2011-01-26 ENCOUNTER — Ambulatory Visit (INDEPENDENT_AMBULATORY_CARE_PROVIDER_SITE_OTHER): Payer: Federal, State, Local not specified - PPO | Admitting: Physician Assistant

## 2011-01-26 ENCOUNTER — Encounter: Payer: Self-pay | Admitting: Physician Assistant

## 2011-01-26 VITALS — BP 124/82 | HR 75 | Ht 62.0 in | Wt 257.0 lb

## 2011-01-26 DIAGNOSIS — R0600 Dyspnea, unspecified: Secondary | ICD-10-CM

## 2011-01-26 DIAGNOSIS — R0989 Other specified symptoms and signs involving the circulatory and respiratory systems: Secondary | ICD-10-CM

## 2011-01-26 DIAGNOSIS — R002 Palpitations: Secondary | ICD-10-CM

## 2011-01-26 DIAGNOSIS — R0609 Other forms of dyspnea: Secondary | ICD-10-CM

## 2011-01-26 DIAGNOSIS — R079 Chest pain, unspecified: Secondary | ICD-10-CM

## 2011-01-26 NOTE — Assessment & Plan Note (Signed)
As noted, followup with PCP.  Consider pulmonary function testing.  I recommended diet and exercise for weight loss as I think this will also help improve her symptoms.

## 2011-01-26 NOTE — Assessment & Plan Note (Signed)
She had a few PVCs on her Holter monitor.  I do not think this is enough to adjust her treatment.  Her blood pressure is well-controlled.

## 2011-01-26 NOTE — Progress Notes (Signed)
History of Present Illness: Primary Cardiologist:  Dr. Rollene Rotunda  Tracy Paul is a 60 yo female who was evaluated by Dr. Antoine Poche in 2009 for dyspnea prior to knee surgery.  An echocardiogram done at that time demonstrated normal LV function with an EF of 60% and left atrial enlargement.  A BNP was checked and this was also normal.  She was cleared for surgery at that time.  She has a history of hypertension, hyperlipidemia, hypothyroidism and thyroid nodule.  According to prior hospital records, she's had cardiac catheterization twice in the past without evidence of coronary disease.  Apparently the last cath was done in 2000.  I have no record of this.  I saw her a couple weeks ago with chest pain and dyspnea.  Her PCP had obtained a chest CT that was negative for pulmonary embolism.  I reviewed the labs done at her PCPs office and her cbc, bmet, tsh and lipids all were unremarkable.  I set her up for a myoview and this demonstrated no ischemia with and EF 60%.  She had some palpitations and feelings of weakness, so I obtained a holter.  This was reviewed today and demonstrated normal sinus rhythm, sinus brady and sinus tachy with PVCs but no arrhythmia.  I had her try Pepcid bid and she follows up today.  Her symptoms are basically unchanged. She denies orthopna or pnd.  No syncope.  She has class 2 dyspnea.  She denies snoring or daytime hypersomnolence.  Past Medical History  Diagnosis Date  . Hypertension   . Hyperlipidemia   . Hypothyroidism   . Thyroid nodule   . GERD (gastroesophageal reflux disease)   . Chest pain     History of normal cardiac catheterization x2; last heart catheter 2000; echocardiogram November 2009: EF 60%; LAE;    Myoview 3/12: EF 60%, no ischemia    Current Outpatient Prescriptions  Medication Sig Dispense Refill  . calcium carbonate (TUMS EX) 750 MG chewable tablet Chew 2 tablets by mouth daily.        . Cholecalciferol (VITAMIN D3) 50000 UNITS CAPS Take by  mouth. I take one by mouth on Sunday, Monday, Wednesday, Friday, Saturday       . diphenhydramine-acetaminophen (TYLENOL PM) 25-500 MG TABS Take 1 tablet by mouth at bedtime as needed.        Marland Kitchen esomeprazole (NEXIUM) 40 MG capsule Take 40 mg by mouth as needed.        . famotidine (PEPCID) 20 MG tablet Take 20 mg by mouth 2 (two) times daily as needed.        . fish oil-omega-3 fatty acids 1000 MG capsule Take 1 g by mouth daily.        Marland Kitchen levothyroxine (SYNTHROID, LEVOTHROID) 100 MCG tablet Take one half tablet by mouth on Sunday, Tuesday, Wednesday, Friday, Saturday and one tablet on Mondays and Thursdays       . lisinopril-hydrochlorothiazide (PRINZIDE,ZESTORETIC) 20-25 MG per tablet Take 1 tablet by mouth daily.        . methocarbamol (ROBAXIN) 750 MG tablet Take 1 tablet by mouth as needed.      . montelukast (SINGULAIR) 10 MG tablet Take 10 mg by mouth as needed.        . Multiple Vitamins-Minerals (MULTIVITAMIN WITH MINERALS) tablet Take 1 tablet by mouth daily.        Marland Kitchen oxyCODONE-acetaminophen (PERCOCET) 5-325 MG per tablet Take 1 tablet by mouth every 4 (four) hours as needed.        Marland Kitchen  potassium gluconate 595 MG TABS Take 595 mg by mouth daily.        . pravastatin (PRAVACHOL) 40 MG tablet Take 40 mg by mouth daily.          Allergies  Allergen Reactions  . Codeine     Vital Signs: BP 124/82  Pulse 75  Ht 5\' 2"  (1.575 m)  Wt 257 lb (116.574 kg)  BMI 47.01 kg/m2  PHYSICAL EXAM: Well nourished, well developed, in no acute distress HEENT: normal Neck: no JVD Cardiac:  normal S1, S2; RRR; no murmur Lungs:  clear to auscultation bilaterally, no wheezing, rhonchi or rales Abd: soft, nontender, no hepatomegaly Ext: no edema Skin: warm and dry Neuro:  CNs 2-12 intact, no focal abnormalities noted  ASSESSMENT AND PLAN:

## 2011-01-26 NOTE — Assessment & Plan Note (Addendum)
With a negative nuclear study, I think this is noncardiac.  I recommend she followup with her primary care provider.  I would consider either a gastroenterology workup or possibly pulmonary function testing as she does have a significant smoking history.  She can followup with cardiology as needed.

## 2011-01-28 NOTE — Progress Notes (Signed)
The patients nuclear study was negative.  I reviewed the office note.  No further cardiac work up or follow is indicated.  Please call the patient with results and send results to the referring MD.

## 2011-01-29 ENCOUNTER — Telehealth: Payer: Self-pay | Admitting: *Deleted

## 2011-01-29 NOTE — Telephone Encounter (Signed)
Pt aware of nuclear study results

## 2011-02-05 ENCOUNTER — Encounter: Payer: Self-pay | Admitting: Pulmonary Disease

## 2011-02-05 ENCOUNTER — Telehealth: Payer: Self-pay | Admitting: Cardiology

## 2011-02-05 NOTE — Telephone Encounter (Signed)
Stress faxed to [facility] @ [phone] °02/05/11/KM °

## 2011-02-06 ENCOUNTER — Ambulatory Visit: Payer: Federal, State, Local not specified - PPO | Admitting: Internal Medicine

## 2011-02-07 ENCOUNTER — Other Ambulatory Visit (HOSPITAL_COMMUNITY): Payer: Self-pay | Admitting: Internal Medicine

## 2011-02-07 DIAGNOSIS — Z1231 Encounter for screening mammogram for malignant neoplasm of breast: Secondary | ICD-10-CM

## 2011-02-08 LAB — COMPREHENSIVE METABOLIC PANEL
Alkaline Phosphatase: 70 U/L (ref 39–117)
BUN: 21 mg/dL (ref 6–23)
Calcium: 8.5 mg/dL (ref 8.4–10.5)
Glucose, Bld: 106 mg/dL — ABNORMAL HIGH (ref 70–99)
Total Protein: 6.3 g/dL (ref 6.0–8.3)

## 2011-02-08 LAB — CBC
HCT: 34 % — ABNORMAL LOW (ref 36.0–46.0)
Hemoglobin: 11.6 g/dL — ABNORMAL LOW (ref 12.0–15.0)
MCHC: 34.1 g/dL (ref 30.0–36.0)
MCV: 87.7 fL (ref 78.0–100.0)
RDW: 14.8 % (ref 11.5–15.5)

## 2011-02-08 LAB — DIFFERENTIAL
Basophils Relative: 0 % (ref 0–1)
Lymphs Abs: 0.5 10*3/uL — ABNORMAL LOW (ref 0.7–4.0)
Monocytes Relative: 3 % (ref 3–12)
Neutro Abs: 7.9 10*3/uL — ABNORMAL HIGH (ref 1.7–7.7)
Neutrophils Relative %: 91 % — ABNORMAL HIGH (ref 43–77)

## 2011-02-08 LAB — LIPASE, BLOOD: Lipase: 17 U/L (ref 11–59)

## 2011-02-12 LAB — CBC
HCT: 26.7 % — ABNORMAL LOW (ref 36.0–46.0)
HCT: 26.9 % — ABNORMAL LOW (ref 36.0–46.0)
HCT: 27 % — ABNORMAL LOW (ref 36.0–46.0)
Hemoglobin: 8.9 g/dL — ABNORMAL LOW (ref 12.0–15.0)
Hemoglobin: 9.2 g/dL — ABNORMAL LOW (ref 12.0–15.0)
MCHC: 33.4 g/dL (ref 30.0–36.0)
MCV: 91.3 fL (ref 78.0–100.0)
MCV: 90.7 fL (ref 78.0–100.0)
MCV: 92 fL (ref 78.0–100.0)
RBC: 4.17 MIL/uL (ref 3.87–5.11)
RBC: 2.9 MIL/uL — ABNORMAL LOW (ref 3.87–5.11)
RBC: 2.97 MIL/uL — ABNORMAL LOW (ref 3.87–5.11)
RBC: 2.97 MIL/uL — ABNORMAL LOW (ref 3.87–5.11)
RDW: 13 % (ref 11.5–15.5)
WBC: 8.5 10*3/uL (ref 4.0–10.5)
WBC: 7 10*3/uL (ref 4.0–10.5)
WBC: 8 10*3/uL (ref 4.0–10.5)

## 2011-02-12 LAB — BASIC METABOLIC PANEL
CO2: 26 mEq/L (ref 19–32)
CO2: 28 mEq/L (ref 19–32)
Calcium: 9.5 mg/dL (ref 8.4–10.5)
Chloride: 104 mEq/L (ref 96–112)
Chloride: 100 mEq/L (ref 96–112)
Chloride: 98 mEq/L (ref 96–112)
Creatinine, Ser: 0.99 mg/dL (ref 0.4–1.2)
Creatinine, Ser: 0.73 mg/dL (ref 0.4–1.2)
GFR calc Af Amer: >60 mL/min (ref >60)
GFR calc Af Amer: >60 mL/min (ref >60)
GFR calc Af Amer: >60 mL/min (ref >60)
GFR calc Af Amer: >60 mL/min (ref >60)
GFR calc non Af Amer: >60 mL/min (ref >60)
Glucose, Bld: 119 mg/dL — ABNORMAL HIGH (ref 70–99)
Potassium: 3.6 mEq/L (ref 3.5–5.1)
Potassium: 3.8 mEq/L (ref 3.5–5.1)
Potassium: 4.1 mEq/L (ref 3.5–5.1)
Sodium: 132 mEq/L — ABNORMAL LOW (ref 135–145)
Sodium: 133 mEq/L — ABNORMAL LOW (ref 135–145)

## 2011-02-12 LAB — PROTIME-INR
INR: 1.1 (ref 0.00–1.49)
Prothrombin Time: 14.2 seconds (ref 11.6–15.2)
Prothrombin Time: 15 seconds (ref 11.6–15.2)

## 2011-03-13 NOTE — Assessment & Plan Note (Signed)
[facility]                                 ON-CALL NOTE   NAME:Tracy Paul, Tracy Paul                       MRN:          [id number]  DATE:01/09/2009                            DOB:          January 02, 1951    CARDIOLOGIST:  Rollene Rotunda, MD, [facility]   PRIMARY CARE PHYSICIAN:  Dr. [name], phone number [phone].   HISTORY:  [name] is a 60 year old female patient who was evaluated  by Dr. Antoine Poche in November 2009 prior to her total knee replacement in  January 2010.  She has multiple cardiac risk factors and had dyspnea  with exertion.  No further cardiac workup was planned prior to her  planned surgery.  She was set up for an echocardiogram that demonstrated  her LV function to be normal with an EF of 60%.  She called the  answering service today with concerns over chest pain, shortness of  breath.  She started having diarrhea and vomiting last night that woke  her up.  She started having chest pain, shortness of breath about an  hour or two ago. She has had chills and aches, but no fevers.  Nobody in  her household is sick.  She denies any radiating symptoms or syncope.  I  spoke to her on the phone and she is telling me that she is not feeling  very well at all.   PLAN:  The patient has been advised to report to the closest emergency  room via ambulance for further evaluation.  I explained this to her and  she understands.   DISPOSITION:  As noted above.  She will contact the EMS via 911 to  report to the closest emergency room for further evaluation of her  symptoms.      Tereso Newcomer, PA-C  Electronically Signed      Madolyn Frieze. Jens Som, MD, [facility]  Electronically Signed   SW/MedQ  DD: 01/09/2009  DT: 01/09/2009  Job #: [id number]   cc:   [name], M.D.

## 2011-03-13 NOTE — Assessment & Plan Note (Signed)
[facility]                            CARDIOLOGY OFFICE NOTE   Tracy Paul, Tracy Paul                     MRN:          [id number]  DATE:09/13/2008                            DOB:          1951-04-04    PRIMARY CARE PHYSICIAN:  [name], MD   ORTHOPEDIST:  Lubertha Basque. Dalldorf, MD   REASON FOR PRESENTATION:  Preoperative evaluation of the patient with  multiple cardiovascular risk factors, dyspnea on exertion, and previous  report of cardiomegaly on chest x-rays.   HISTORY OF PRESENT ILLNESS:  The patient is 60 years old.  She has had  cardiac workups in the past.  She has had catheterization in 1990s and a  stress test followed by catheterization in 2000.  I do not have the  report of the catheterization, but she apparently did not need any  intervention and was not told that she had any heart disease.  This done  by another Cardiology Practice.  Since that time, she has had no further  cardiovascular testing.  She is due to have left knee replacement.  She  is wanting to wait until after the first of the year.  She has limit in  her activities because of this.  However, she can do activities such as  climbing a ladder.  She still works.  She unloads trucks.  She does not  get with this any chest pressure, neck or arm discomfort.  However, she  does get dyspnea with exertion.  This is slowly progressive.  She can  climb a flight of stairs.  She can keep going, but will be dyspneic.  She does not have to stop.  She does not describe resting shortness of  breath.  She does have occasional palpitations.  She will have  occasional dizzy spells, but no syncope.  She gets some sporadic chest  discomfort that she really associates with dyspnea as more difficulty  breathing rather than chest pressure.  She also has reflux that can  associate this with food.   PAST MEDICAL HISTORY:  Hypertension since 1995, hyperlipidemia,  hypothyroidism,  gastroesophageal reflux disease, thyroid nodule,  obesity.  There is a mention in Dr. Kathryne Sharper chart of non-insulin-  dependent diabetes mellitus, but the patient denies this and I do see a  normal hemoglobin A1c this year.   PAST SURGICAL HISTORY:  Sinus surgery, bilateral tubal ligation,  bilateral carpal tunnel surgeries, left knee arthroscopy.   ALLERGIES AND INTOLERANCES:  CODEINE and NABUMETONE.   MEDICATIONS:  1. Pravastatin 40 mg daily.  2. Lisinopril 20/12.5 daily.  3. Levothyroxine 100 mcg daily.  4. Aspirin 81 mg daily.  5. Vitamin D3.  6. Calcium.  7. Potassium.  8. Antacids.   SOCIAL HISTORY:  The patient is married.  She has two sons.  She still  works.  She quit smoking 27 years ago after one-pack per day for 20  years.   FAMILY HISTORY:  Contributory for early coronary disease.  Her father  died at 54 and had his first MI in his 25s.  She had a sister with  stenting  in her 33s.   REVIEW OF SYSTEMS:  As stated in the HPI and positive for occasional  headaches, upper dentures, cough occasionally, joint pains, lower  extremity swelling.  Negative for all other systems.   PHYSICAL EXAMINATION:  GENERAL:  The patient is pleasant and in no  distress.  VITAL SIGNS:  Blood pressure 126/71, heart rate 60 and regular, body  mass index 44.  HEENT:  Eyes unremarkable.  Pupils equal, round, and reactive to light.  Fundi not visualized.  Oral mucosa unremarkable.  NECK:  No jugular venous distention at 45 degrees.  Carotid upstroke  brisk and symmetrical.  No bruits, no thyromegaly.  LYMPHATICS:  No  cervical, axillary, or inguinal adenopathy.  LUNGS:  Clear to auscultation bilaterally.  BACK:  No costovertebral angle mass.  CHEST:  Unremarkable.  HEART:  PMI not displaced or sustained.  S1 and S2 within normal limits.  No S3, no S4.  No clicks, no rubs, no murmurs.  ABDOMEN:  Obese,  positive bowel sounds normal in frequency and pitch.  No bruits, no  rebound, no  guarding.  No midline pulsatile mass.  No hepatomegaly.  SKIN:  No rashes, no nodules.  EXTREMITIES:  2+ pulses throughout.  No edema, no cyanosis, no clubbing.  NEUROLOGICAL:  Oriented to person, place, and time.  Cranial nerves II  through XII grossly intact.  Motor grossly intact.   EKG, sinus bradycardia, rate 54, poor anterior R-wave progression, low-  voltage in the chest leads.   ASSESSMENT AND PLAN:  1. Preoperative clearance.  The patient has no high-risk features from      a cardiovascular standpoint.  She does have a reasonable functional      level (greater than 5 METS).  She is going for moderate risk      procedure.  Based on this, no further cardiovascular testing would      be suggested according to ACC/AHA guidelines.  She would be at      acceptable risk for the planned surgery.  I would like to      investigate the dyspnea as described below to make sure that there      is no obvious structural cardiac abnormality.  I do not suspect      this from physical exam.  2. Dyspnea.  The patient does complain of this.  She does have an      abnormal EKG which may represent lead placement and body habitus.      She does have this vague history of cardiomegaly apparently from      chest x-rays.  I am going to check an echocardiogram and BNP level.      If these are normal, then I would not suggest further evaluation of      her dyspnea as it may be related to weight and deconditioning or      perhaps her primary pulmonary process.  I would defer to Dr.      Oneta Rack.  3. Obesity.  She understands the need to lose weight with diet and      exercise.  4. Hypertension.  Blood pressure is well controlled.  She will      continue the medications as      listed.  5. Dyslipidemia, per Dr. Oneta Rack.     Rollene Rotunda, MD, [facility]  Electronically Signed    JH/MedQ  DD: 09/13/2008  DT: 09/14/2008  Job #: [id number]   cc:   [name], M.D.  Lubertha Basque Jerl Santos, M.D.

## 2011-03-13 NOTE — Discharge Summary (Signed)
[name], [name]              ACCOUNT NO.:  [id number]   MEDICAL RECORD NO.:  [id number]          PATIENT TYPE:  INP   LOCATION:  5032                         FACILITY:  [facility]   PHYSICIAN:  Lubertha Basque. Dalldorf, M.D.DATE OF BIRTH:  [birth year]   DATE OF ADMISSION:  11/18/2008  DATE OF DISCHARGE:  11/21/2008                               DISCHARGE SUMMARY   ADMITTING DIAGNOSES:  1. Left knee end-stage degenerative joint disease.  2. Obesity.  3. Hypertension.  4. Hypothyroidism.  5. Hyperlipidemia.   DISCHARGE DIAGNOSES:  1. Left knee end-stage degenerative joint disease.  2. Obesity.  3. Hypertension.  4. Hypothyroidism.  5. Hyperlipidemia.   OPERATIONS:  Left total knee replacement.   BRIEF HISTORY:  [name] is a patient well known to our practice.  She  is a 59 year old white female with complaints of increasing left knee  pain.  Her x-rays reveal end-stage DJD, severe.  She is having  increasing pain with walking, trouble at nighttime, sleeping comfortably  and we have discussed treatment options that being a total knee  replacement.   PERTINENT LABORATORY AND X-RAY FINDINGS:  Hemoglobin 9.2, WBCs 8.2,  platelets of 151.  Sodium 132, potassium 4.1, BUN 9, creatinine 0.71,  glucose 119.  Serial INRs were done last one being 1.5 or slightly  higher, dose regulated by pharmacy.   COURSE IN THE HOSPITAL:  The patient was admitted postoperatively,  placed on variety of p.o. IM analgesics for pain and PCA Dilaudid pump  was used, appropriate antibiotics x3 doses.  Home medications which will  be outlined at the end of this dictation.  She is on pharmacy protocol  for Coumadin and Lovenox for DVT prophylaxis along with knee-high TEDs,  incentive spirometry, CPM machine 0-50 and advance as tolerated.  Therapy for weightbearing as tolerated.  Appropriate antiemetics,  ferrous sulfate, laxatives as needed.  First-day postop,  her blood  pressure was 105/77, temperature  97.2, PO2 91, then she was on O2 and  pulse oximeter was 94 - 96.  Her wounds were noted to be benign.  Drain  in place.  Good neurovascular status to her lower extremity, good breath  sounds.  Abdomen soft.  These Foley catheters were discontinued that  they as well.  The second-day postop, her dressing was changed and wound  was noted to be benign and no sign of infection.  Calf soft and  nontender, was working well with physical therapy and progressing.  Third-day postop, was discharged home.   CONDITION ON DISCHARGE:  Improved.   FOLLOWUP:  She will remain on a low-sodium heart-healthy diet.  Weightbearing as tolerated.  Any sign of infection to call our office  [phone], also that same number for an appointment in 10 days.  Home  therapy and blood draws for INR with a protocol between 2-3 for DVT  prophylaxis.  Should be kept on her home medications, which are  1. K-Dur 10 mEq one a day.  2. Hydrochlorothiazide 12.5 one a day.  3. Os-Cal one a day.  4. Prinivil 20 mg one a day.  5. Levothroid 100  mcg one a day.  6. Urecholine 25 mg q.8 h.  7. Zocor 20 mg daily.  8. Vitamin E supplement along with Percocet one or two q.4-6 h. p.r.n.      pain.  9. Coumadin dose regulated by pharmacy.      Lindwood Qua, P.A.      Lubertha Basque Jerl Santos, M.D.  Electronically Signed    MC/MEDQ  D:  11/21/2008  T:  11/22/2008  Job:  [id number]

## 2011-03-13 NOTE — Op Note (Signed)
NAMELAKETRA, BOWDISH              ACCOUNT NO.:  [id number]   MEDICAL RECORD NO.:  [id number]          PATIENT TYPE:  INP   LOCATION:  5032                         FACILITY:  [facility]   PHYSICIAN:  Lubertha Basque. Dalldorf, M.D.DATE OF BIRTH:  1951/07/08   DATE OF PROCEDURE:  11/18/2008  DATE OF DISCHARGE:                               OPERATIVE REPORT   PREOPERATIVE DIAGNOSIS:  Left knee degenerative joint disease.   POSTOPERATIVE DIAGNOSIS:  Left knee degenerative joint disease.   PROCEDURE:  Left total knee replacement.   ANESTHESIA:  General and block.   ATTENDING SURGEON:  Lubertha Basque. Jerl Santos, MD   ASSISTANT:  Lindwood Qua, PA   INDICATIONS FOR PROCEDURE:  The patient is a 60 year old woman with many  years of painful knees.  The left side has been the worst.  She has  failed oral anti-inflammatories and multiple injections.  With pain  which limits her ability to rest and walk, she is offered a knee  replacement operation.  Informed operative consent was obtained after  discussion of possible complications including reaction to anesthesia,  infection, DVT, PE, and death.   SUMMARY FINDINGS AND PROCEDURE:  Under general anesthesia and a block, a  left knee replacement was performed.  She had advanced degenerative  change medial and good bone quality.  We addressed her problem with a  cemented DePuy LCS system.  I used a 4 MBT revision tray to address her  stature and then placed a standard femur, 12.5 deep-dish insert, and 38-  mm all-polyethylene patella.  Lindwood Qua assisted throughout and  was invaluable to the completion of the case in that he helped position  and retract while I performed the procedure.  He also closed  simultaneously to help minimize OR time.  This case was difficult and  extended in length due to the size of the patient.  This added  significantly to OR time and difficulty.   DESCRIPTION OF PROCEDURE:  The patient was brought to the operating  suite where general anesthetic was applied without difficulty.  She was  also given a block in the preanesthesia area.  She was positioned supine  and prepped and draped in normal sterile fashion.  After the  administration of IV Kefzol, the left leg was elevated, exsanguinated,  and a tourniquet inflated about the thigh.  A longitudinal anterior  incision was made with dissection down the extensor mechanism.  She had  an extremely thick adipose layer with probably 2 or 3 inches of fat  before reaching the fascial layer.  A medial parapatellar incision was  made.  All appropriate anti-infected measures were used including closed  hooded exhaust systems for each member of the surgical team,  preoperative IV antibiotic, and Betadine-impregnated drape.  The kneecap  was slipped and we created a pouch in the adipose to accept the kneecap  as her leg was so large that it could not easily slip off in the  appropriate position.  Some residual meniscal tissues were removed along  with the ACL and all of the PCL.  Most of the fat pad was excised.  A  flat cut was made on the tibia with an intramedullary guide followed by  placement of a femoral intramedullary guide to create anterior and  posterior cuts creating a flexion gap of 12.5 mm.  A second  intramedullary guide was placed in the femur to make a distal cut  creating an equal extension gap of 12.5 mm balancing the knee.  The  tibia sized to a 4 and the femur to a standard and appropriate guides  were placed and utilized.  We elected to place the short stem on the  tibia due to her size, and this was reamed appropriately.  The patella  was cut down thickness by 12 mm to 15 and sized to 38 with the  appropriate guide placed and utilized.  A trial reduction was done with  these components and she easily came to slight hyperextension and flexed  well.  The patella tracked well.  Trial components were removed followed  by pulsatile lavage  irrigation of all 3 cut bony surfaces.  Cement was  mixed including Zinacef antibiotic, followed by pressurization on the  bones.  The aforementioned DePuy LCS components were utilized.  Excess  cement was trimmed and pressure was held in the components until the  cement had hardened.  The tourniquet was deflated and a small amount of  bleeding was easily controlled with Bovie electrocautery.  The knee was  irrigated followed by placement of drain exiting superolaterally.  The  extensor mechanism was reapproximated with #1 Vicryl in interrupted  fashion followed by subcutaneous reapproximation in 3 or 4 layers with 0  and 2-0 undyed Vicryl.  Skin was closed with staples.  Adaptic was  applied followed by dry gauze and loose Ace wrap.  Estimated blood loss  and intraoperative fluids were obtained from anesthesia records as can  accurate tourniquet time, which again was extended due to the size of  the patient and difficulty of the case.   DISPOSITION:  The patient was extubated in the operating room and taken  to recovery room in stable addition.  She was to be admitted for  appropriate postop care to include perioperative antibiotics and  Coumadin plus Lovenox for DVT prophylaxis.      Lubertha Basque Jerl Santos, M.D.  Electronically Signed     PGD/MEDQ  D:  11/18/2008  T:  11/18/2008  Job:  [id number]

## 2011-03-16 NOTE — H&P (Signed)
[facility]  Patient:    Tracy Paul                      MRN: [id number] Adm. Date:  [id number] Attending:  Meade Maw A Dictator:   Anselm Lis, N.P. CC:         Chales Salmon. Abigail Miyamoto, M.D.                         History and Physical  HISTORY OF PRESENT ILLNESS:  Ms. Baxley is a pleasant obese 60 year old female ith a history of hypertension, who has been complaining of 1 to 1-1/2 months of anterior chest pressure, described as a "fist pushing straight through to my back." The discomfort is described to be tightness/pressure.  The patient states the discomfort has been almost constant, but does wax and wane in severity.  Not particularly exacerbated with activity.  She denies associated exacerbation of shortness of breath, diaphoresis with intensification of this discomfort. Occasionally she feels some nausea with this discomfort, particularly severe. Otherwise the patient complains of increasing shortness of breath/DOE over the ast few months, so that she will get short-winded when she is doing simple activities, such as making her bed.  CARDIAC RISK FACTORS:  Obesity, history of hypertension, positive family history of coronary artery disease.  PAST MEDICAL HISTORY: 1. Hypertension for a few years. 2. GERD. 3. Obesity.  PAST SURGICAL HISTORY: 1. Tubal ligation. 2. Sinus surgery. 3. Left knee surgery, arthroscopic, by Dr. Colon Flattery. Harkins. 4. Bilateral carpal tunnel release.  The patient denies a history of diabetes mellitus, asthma, cancer, peptic ulcer  disease, thyroid disease, or arthritic complaints.  Unknown lipid panel.  ALLERGIES:  CODEINE, causing GI upset and hypersomnolence.  CURRENT MEDICATIONS: 1. Hydrochlorothiazide 25 mg one p.o. q.d. 2. Prempro one q.d. 3. Atenolol 100 mg p.o. q.d.  SOCIAL HISTORY/HABITS:  Tobacco use:  Quit approximately 15 years ago.  Prior to this smoked approximately two packs per  day for 17 years.  ETOH:  Negative. Caffeine:  Four to six sodas a day.  The patient is married for 25 years.  Has wo sons and two grandsons who are alive and well.  The patient works at [employer].  FAMILY HISTORY:  Dad deceased at age 63s, complications related to Guillain-Barre. Had prior heart attacks, the first at age 50.  Mother is age 51 and doing fine.  The patient has two sisters ages 51 and 81, whose cardiac history is unknown.  REVIEW OF SYSTEMS:  She wears glasses (bifocals).  Denies problems with her hearing.  Complains of episodic spells of dizziness that occur at unpredictable  times.  These occur approximately three to four times a month and are transient. Also has transient palpitations, not associated with the dizzy spells.  Denies dysphagia to food or fluid.  Does have GERD, which is well-controlled with over-the-counter Titralac.  Feels like epigastric burning.  Denies melena, constipation, diarrhea, or bright red blood PR.  Negative dysuria or hematuria. No arthritic-type complaints, but states she has achiness in her joints overall. Negative pedal edema or PND.  Does have complaints of orthopnea which is chronic x many years.  PHYSICAL EXAMINATION:  VITAL SIGNS:  Blood pressure 139/60 with heart rate of 48 and regular, respirations 20, temperature 97.7 degrees.  Height 5 feet 5 inches, weight 240 pounds.  GENERAL:  She is an obese pleasantly conversant middle-aged female, in  no apparent distress.  She is accompanied today by her husband and friends.  HEENT/NECK:  Muted bilateral carotid upstrokes without bruits.  No significant jugular venous distention.  A difficult examination secondary to the large neck  size.  CHEST:  Lung sounds clear after cough.  No CPA tenderness.  CARDIAC:  A regular rate and rhythm, bradycardia, normal S1, S2, without murmur, rub, or gallop appreciated.  ABDOMEN:  Soft, obese, normoactive bowel  sounds.  Negative abdominal aortic, renal, or femoral bruits.  Nontender to palpation.  Negative masses or organomegaly appreciated, but a difficult examination secondary to obesity.  EXTREMITIES:  With +2/4 bilateral radial, dorsalis pedis, and posterior tibial pulses, with +1/4 bilateral radial.  Negative pedal edema.  NEUROLOGIC:  Cranial nerves II-XII grossly intact.  Alert and oriented x 3.  GENITOURINARY:  Deferred.  RECTAL:  Deferred.  LABORATORY DATA:  Chemistry reveals a potassium of 140, K of 3.7, chloride 104, CO2 of 28, BUN 13, creatinine 1.1, glucose 95, calcium okay at 9.  CBC reveals a hemoglobin of 12.8, wbcs 7, platelets 326.  PT 11.9 with PTT 29, INR of 1.02.  Chest x-ray from August 21, 1999, revealed no active disease.  Electrocardiogram from August 22, 1999, revealed normal sinus rhythm, without ischemic changes.  A recent stress Cardiolite was suspicious for coronary artery disease in the anterior/anteroapical distribution.  Normal LV function.  IMPRESSION: 1. Atypical chest discomfort in this 60 year old.  Follow-up stress Cardiolite    suspicious for anterior/anteroapical distribution ischemia. 2. History of hypertension, well-controlled on current medical regimen. 3. Obesity. 4. History of gastroesophageal reflux disease.  PLAN:  Cardiac catheterization to assess the coronary anatomy with possible PCI  if indicated and able.  The risks, potential complications, benefits, and alternatives to the procedure  offered to the patient.  She states she did not wish a review, as she has been through this 12 years earlier, and has no further questions.  She is agreeable o proceed. DD:  09/15/99 TD:  09/15/99 Job: [id number]

## 2011-03-16 NOTE — Discharge Summary (Signed)
   NAME:  Tracy Paul, Tracy Paul                        ACCOUNT NO.:  [id number]   MEDICAL RECORD NO.:  [id number]                   PATIENT TYPE:  INP   LOCATION:  3705                                 FACILITY:  [facility]   PHYSICIAN:  Melissa L. Ladona Ridgel, MD               DATE OF BIRTH:  January 05, 1951   DATE OF ADMISSION:  09/07/2003  DATE OF DISCHARGE:  09/09/2003                                 DISCHARGE SUMMARY   ADDENDUM:   DISCHARGE LABORATORIES:  Creatinine 1.1, BUN 21, glucose 104, potassium 4.3,  sodium 136.  Hemoglobin 14.0, hematocrit 42.0.   CONSULTS:  Dr. Meade Maw of [facility].   CONDITION AT DISCHARGE:  Good.   DISPOSITION:  Discharged to home.   FOLLOWUP:  The patient has no acute followup needs regarding this admission,  however, Dr. Harvin Hazel office will call the patient and arrange an  outpatient stress test.      Ellender Hose. Davis, N.P.                    Melissa L. Ladona Ridgel, MD    SMD/MEDQ  D:  09/09/2003  T:  09/10/2003  Job:  [id number]   cc:   Meade Maw, M.D.  [address]  [place]  [place] [address]  Fax: [phone]   Chales Salmon. Abigail Miyamoto, M.D.  [address]  [place]  [place] [address]  Fax: [phone]

## 2011-03-16 NOTE — Discharge Summary (Signed)
NAME:  Tracy Paul, Tracy Paul                        ACCOUNT NO.:  [id number]   MEDICAL RECORD NO.:  [id number]                   PATIENT TYPE:  INP   LOCATION:  3705                                 FACILITY:  [facility]   PHYSICIAN:  Melissa L. Ladona Ridgel, MD               DATE OF BIRTH:  [birth year]   DATE OF ADMISSION:  09/07/2003  DATE OF DISCHARGE:  09/09/2003                                 DISCHARGE SUMMARY   PRIMARY CARE PHYSICIAN:  Chales Salmon. Abigail Miyamoto, M.D.   CARDIOLOGIST:  Meade Maw, M.D.   DISCHARGE DIAGNOSES:  1. Atypical chest pain, no evidence for cardiac ischemia, likely secondary     to hiatal hernia.  2. Hiatal hernia, beginning proton pump inhibitor therapy.  3. Obesity.  4. Hypertension, controlled.  5. Hyperlipidemia, controlled.  6. Gastroesophageal reflux disease.  7. Gastroesophageal reflux disease, controlled.   DISCHARGE MEDICATIONS:  1. Lisinopril 20 mg p.o. daily.  2. Hydrochlorothiazide 12.5 mg p.o. daily.  3. Aspirin 81 mg daily.  4. Prilosec OTC one per day.  5. Cholesterol medicine the patient was on prior to admission.   ALLERGIES:  1. The patient is intolerant of BETA-BLOCKER.  These cause hypotension and     bradycardia.  2. She has a true allergy to CODEINE.   PROCEDURES:  None.   HISTORY OF PRESENT ILLNESS:  This is a 60 year old lady who had onset of  acute chest pain on the day of presentation.  This is described as mid  sternal, radiating through the back with diaphoresis and malaise.  She  states she was initially dyspneic although she is free of respiratory  distress at the time of presentation.  There was no associated exertion or  trauma, no recent similar episodes.  No nausea.  She has been evaluated for  chest pain in the past.  She had two normal cardiac catheterizations, most  recently in November 2004 done by Dr. Fraser Din.  Of note, this patient's  father had a history of myocardial infarction before the age of 28, and she  has a  35-pack a year smoking history.  She quit smoking in 1985.  She is  admitted to rule out myocardial infarction.   HOSPITAL COURSE:  The patient's 12-lead EKG revealed normal sinus rhythm  with no acute ST or T wave changes.  She is admitted to the telemetry bed  where she remained in sinus rhythm with no acute changes a well.  The  patient was initially started on low-dose beta-blocker.  However, this was  discontinued early in her hospitalization secondary to marked bradycardia  which was asymptomatic.  Of note, the patient has had difficulty with other  beta-blockers in the past causing bradycardia and hypotension.   The patient was pain-free at the time of admission, and remained so  throughout her hospitalization.  She had no nausea, vomiting, diaphoresis,  chest pain or palpitations.  She was evaluated  by Dr. Meade Maw of [facility] who recommended an outpatient stress test for the patient.  Enzymes were negative x3.  The patient had no acute EKG changes during his  hospitalization.   The patient does have a history of a hiatal hernia which has been untreated  in the past, as well as moderate GERD.  She is therefore started on PPI  therapy.  This is thought to be the cause of her pain at this time.  At the  time of discharge, the patient is free of any signs or symptoms of distress.  Temperature is 98.6.  Blood pressure 108/82.  Heart rate is 90.  Respirations 18.  Room air saturations are 97%.   DISCHARGE LABORATORIES:  Total cholesterol 171, triglycerides  124, HDL 55,  LDL 91.  PT 12.6.  INR 0.9.  Sodium 136, potassium 4.3.  BUN 21.   DICTATION ENDED AT THIS POINT.      Ellender Hose. Davis, N.P.                    Melissa L. Ladona Ridgel, MD    SMD/MEDQ  D:  09/09/2003  T:  09/10/2003  Job:  [id number]

## 2011-03-16 NOTE — Discharge Summary (Signed)
NAME:  Tracy Paul, Tracy Paul                        ACCOUNT NO.:  [id number]   MEDICAL RECORD NO.:  [id number]                   PATIENT TYPE:  INP   LOCATION:  3733                                 FACILITY:  [facility]   PHYSICIAN:  Deirdre Peer. Polite, M.D.              DATE OF BIRTH:  12-07-1950   DATE OF ADMISSION:  11/15/2002  DATE OF DISCHARGE:  11/18/2002                                 DISCHARGE SUMMARY   PRIMARY CARE PHYSICIAN:  Chales Salmon. Abigail Miyamoto, M.D.   DISCHARGE DIAGNOSES:  1. Atypical chest pain - resolved.  2. Hypertension.  3. Borderline high cholesterol.   DISCHARGE MEDICATIONS:  Lisinopril, hydrochlorothiazide 20/12.5 mg q.d.,  HRT, Protonix 40 mg p.o. q.d., and the patient was on Atenolol 50 mg q.d.  which is on hold at the time of discharge.   ALLERGIES:  CODEINE causes hives.   PROCEDURE:  None.   HISTORY OF PRESENT ILLNESS:  A 60 year old white female with a history of  hypertension and obesity who comes to the emergency department for  evaluation.  The patient was in her usual state of health until the night  before presentation when she had mid epigastric abdominal pain followed by  nausea, vomiting and retrosternal pain.  No evidence of coronary artery  disease, questionable history of gastroesophageal reflux disease.  The  patient did eat out at banquet last evening but no other family or friends  got sick.  No orthopnea.  No PND.  No dyspnea on exertion.  No leg edema.  The patient has minimal complaints walking up stairs secondary to  osteoarthritis of the knees.  The patient is admitted for evaluation of her  chest pain and to rule out myocardial infarction.   HOSPITAL COURSE:  In the emergency department EKG revealed normal sinus  rhythm without acute ST-T wave changes.  Vital signs:  Blood pressure  147/72, pulse 70, respirations 22 and temp 99.0.  Labs include sodium 136,  potassium 4.7, BUN 17, creatinine 1.1.  Amylase and lipase were within  normal  limits.  Total bilirubin was 2.2.  CK 114, MB 1.2, troponin 0.01.  Chest x-ray was negative for infiltrates.  The patient was admitted to the  telemetry unit for further evaluation.  She was maintained on her outpatient  medications.  Her chest pain did resolve after approximately 24 hours on  Protonix.  Abdominal ultrasound was obtained which revealed no acute process  with no evidence of gallstones or biliary duct dilatation.  Three sets of  serial cardiac enzymes were negative.  Spiral CT was performed to rule out  PE which revealed no acute disease.  No evidence of PE.   On 11/17/02 the patient did experience some hypertension associated with a  brief period of bradycardia with heart rate into the mid 50s.  An EKG at  this time revealed sinus bradycardia without ST-T wave changes.  The  patient's blood pressure  had actually been between 100 and 110 most of the  day prior to this episode.  It was felt that the patient may have  experienced excessive beta blockade.  The patient's blood pressure  medications were placed on hold with resultant return of her blood pressure  to 123/78 with a pulse of 60, heart rate of 20 and a room air sat of 97%.  She denies chest pain, shortness of breath, dizziness or palpitations at the  time of discharge.  She is instructed to follow up with her primary MD as  noted below.  Her primary MD has been apprised of her admission and followup  requirements.   DISCHARGE LABS:  H. pylori test was 0.40 which is considered to be negative.  Labs are as otherwise noted above.  Hemoglobin 13.8, hematocrit 40.3.   CONSULTS:  None.   CONDITION ON DISCHARGE:  Good.   DISPOSITION:  Discharged to home.   FOLLOW UP:  With Dr. Henrine Screws on 11/25/2002 at 11:00 a.m.     Ellender Hose. Virl Son. Polite, M.D.    SMD/MEDQ  D:  11/18/2002  T:  11/19/2002  Job:  [id number]   cc:   Chales Salmon. Abigail Miyamoto, M.D.  [address]   [place]  [place] [address]  Fax: [phone]

## 2011-03-16 NOTE — H&P (Signed)
NAME:  Tracy, [name]                        ACCOUNT NO.:  [id number]   MEDICAL RECORD NO.:  [id number]                   PATIENT TYPE:  INP   LOCATION:  3705                                 FACILITY:  [facility]   PHYSICIAN:  Sherin Quarry, MD                   DATE OF BIRTH:  Dec 26, 1950   DATE OF ADMISSION:  09/07/2003  DATE OF DISCHARGE:                                HISTORY & PHYSICAL   HISTORY OF PRESENT ILLNESS:  Tracy Paul is a 60 year old lady who  presents to [facility] emergency room on September 07, 2003 with history of  onset of acute chest pain this morning, described as midsternal chest  pressure radiating through to the back, with diaphoresis and malaise.  She  states that she was initially dyspneic, although she is having no  respiratory difficulty at this time.  The pain was initially severe, but now  is much better.  There was no associated exertion or trauma.  There have  been no recent similar episodes.  The patient was a little bit diaphoretic.  There are no associated nausea.  The patient has been evaluated for atypical  chest pain in the past.  She has had two normal cardiac catheterizations,  most recently in November of 2000, a procedure which was done by Dr.  Fraser Din.  Of note is that the patient's father had a history of an MI before  age 76.  She has a 35-pack year smoking history, and discontinued cigarette  smoking in 1985.   PAST MEDICAL HISTORY:   ALLERGIES:  She is allergic to CODEINE.   CURRENT MEDICATIONS:  1. Lisinopril 20/12.5 once a day.  2. Fem HRT, which was discontinued last month.  3. A cholesterol pill.   OPERATIONS:  She has a tubal ligation, sinus operation, left knee  arthroscopy, and bilateral carpal tunnel procedures.   FAMILY HISTORY:  The patient's father has had multiple myocardial  infarctions, the first at age 47.  She also reports that her mother has been  in good health.  She has a sister who has hypertension.   SOCIAL  HISTORY:  She has no history of alcohol or drug abuse.  She has a 35-  pack year smoking history.   REVIEW OF SYSTEMS:  HEAD:  She reports that she has chronic headaches.  EARS, NOSE, THROAT:  She denies earaches, sinus pain, or sore throat.  CHEST:  She denies shortness of breath, coughing, or wheezing.  She is very  sedentary.  CARDIOVASCULAR:  There has been no history of orthopnea or PND.  Otherwise, see above.  GI:  She has chronic acid reflux.  She denies  hematemesis of melena.  There has been no dysphagia.  GU:  She denies  dysuria or urinary frequency.  RHEUMATOLOGIC:  She denies back pain or joint  pain.  HEMATOLOGIC:  She denies easy bleeding or bruising.  NEUROLOGIC:  Denies history of seizure or stroke.   PHYSICAL EXAMINATION:  GENERAL:  She is a pleasant, alert lady, who is in no  distress.  HEENT:  Within normal limits.  CHEST:  Clear.  CARDIOVASCULAR:  Normal S1 and S2 without murmurs, rubs, or gallops.  ABDOMEN:  Benign.  There are normal bowel sounds without masses, tenderness,  or organomegaly.  NEUROLOGIC:  Neurologic testing and examination of the extremities are  normal.   The electrocardiogram showed no acute ischemic changes.  Initial cardiac  enzymes were negative.   IMPRESSION:  1. Chest pain, rule out myocardial infarction.  Note, the patient has a     history of two previous normal cardiac catheterizations.  2. Obesity.  3. Hypertension.  4. Hyperlipidemia.  5. Family history of coronary artery disease.  6. Gastroesophageal reflux.   PLAN:  Admit the patient to rule out myocardial infarction.  Will obtain  serial cardiac enzymes and monitor her on telemetry.  Will ask Dr. Fraser Din  to reevaluate the patient and see whether she feels that further cardiac  testing is warranted.                                                Sherin Quarry, MD    SY/MEDQ  D:  09/07/2003  T:  09/08/2003  Job:  [id number]   cc:   Chales Salmon. Abigail Miyamoto, M.D.  [address]  [place]  [place] [address]  Fax: [phone]   Meade Maw, M.D.  [address]  [place]  [place] [address]  Fax: [phone]

## 2011-03-16 NOTE — Op Note (Signed)
   NAME:  Tracy Paul, Tracy Paul                        ACCOUNT NO.:  [id number]   MEDICAL RECORD NO.:  [id number]                   PATIENT TYPE:  AMB   LOCATION:  ENDO                                 FACILITY:  [facility]   PHYSICIAN:  John C. Madilyn Fireman, M.D.                 DATE OF BIRTH:  20-Jan-1951   DATE OF PROCEDURE:  03/05/2003  DATE OF DISCHARGE:  03/05/2003                                 OPERATIVE REPORT   PROCEDURE:  Colonoscopy.   INDICATION FOR PROCEDURE:  Colon cancer screening.   DESCRIPTION OF PROCEDURE:  The patient was placed in the left lateral  decubitus position and placed on the pulse monitor with continuous low-flow  oxygen delivered by nasal cannula.  She was sedated with 100 mcg IV fentanyl  and 10 mg IV Versed.  The Olympus video colonoscope was inserted into the  rectum and advanced in the cecum, confirmed by transillumination at  McBurney's point and visualization of the ileocecal valve and appendiceal  orifice.  The prep was excellent.  The cecum appeared normal.  There were  some ascending, transverse, descending, and sigmoid diverticula, more  pronounced in the left but fairly numerous on the right as well.  No other  abnormalities were noted.  The rectum appeared normal, and retroflexed view  of the anus revealed no obvious internal hemorrhoids.  The colonoscope was  then withdrawn and the patient returned to the recovery room in stable  condition.  She tolerated the procedure well, and there were no immediate  complications.   IMPRESSION:  1. Diverticulosis.  2. Otherwise, normal colonoscopy.   PLAN:  Next colon screening by sigmoidoscopy in five years.                                               John C. Madilyn Fireman, M.D.    JCH/MEDQ  D:  03/05/2003  T:  03/07/2003  Job:  [id number]   cc:   Chales Salmon. Abigail Miyamoto, M.D.  [address]  [place]  [place] [address]  Fax: [phone]

## 2011-03-16 NOTE — Consult Note (Signed)
NAME:  Tracy Paul, Tracy Paul                        ACCOUNT NO.:  [id number]   MEDICAL RECORD NO.:  [id number]                   PATIENT TYPE:  INP   LOCATION:  3705                                 FACILITY:  [facility]   PHYSICIAN:  Meade Maw, M.D.                 DATE OF BIRTH:  1951-03-10   DATE OF CONSULTATION:  DATE OF DISCHARGE:                                   CONSULTATION   REFERRING PHYSICIAN:  Chales Salmon. Abigail Miyamoto, M.D.   INDICATION FOR CONSULT:  Chest pain.   HISTORY:  Tracy Paul is a 60 year old female who has had a history of  chest pain with initial onset in approximately 1996.  She has subsequently  underwent extensive cardiac workup including two normal cardiac  catheterizations.  She presents to the hospital on November 2004, with  complaints of severe chest pain radiating to the back, associated with  diaphoresis and malaise.  There was no nausea or vomiting.  There were no  aggravating or alleviating factors noted.  The pain was described as severe  and persistent.  She has had intermittent chest pain since her last  catheterization in November 2000.  She has had a CT scan performed in  January 2004, for chest pain to evaluate for pulmonary embolus.  It was a  normal study, except for a small hiatal hernia.  She has had no further  chest pain since hospitalization.   CORONARY RISK FACTORS:  Her coronary risk factors include postmenopausal  status, tobacco use, and family history.   PAST MEDICAL HISTORY:  Significant for hypertension, dyslipidemia, GERD,  hiatal hernia, and obesity.   OUTPATIENT MEDICATIONS:  1. Lisinopril/hydrochlorothiazide 20/12.5.  2. A cholesterol medication which was unknown.  3. She has recently discontinued her hormone replacement therapy.   PAST SURGICAL HISTORY:  Significant for tubal ligation, sinus operation,  left knee arthroscopy, and bilateral carpal tunnel syndrome.   FAMILY HISTORY:  Father has had multiple myocardial  infarction with first at  age 55.  Mother is in good health.  Sister with hypertension.   SOCIAL HISTORY:  She has no history of alcohol or drug abuse.  She has a 35-  year smoke history.   REVIEW OF SYSTEMS:  She has chronic headaches.  She is sedentary.  She has  chronic acid reflux.  She denies hematemesis or melena.  There has been no  dysphagia.  No easy bruising, no presyncope, syncope, palpitations, or  tachyrhythmia.  On review of the chart, it is noted that she had bradycardia  and her beta blockers have been placed on hold.  She subsequently has had  heart rates in the 70s-90s.   PHYSICAL EXAMINATION:  GENERAL:  Reveals an obese female in no acute  distress.  She is alert and oriented.  [name] is appropriate.  She is  ready for discharge.  SKIN:  Warm and dry.  HEENT:  Unremarkable.  PULMONARY:  Reveals breath sounds which are equal and clear to auscultation.  No use of accessory muscles.  CARDIOVASCULAR:  Reveals a normal S1, normal S2, regular rate and rhythm, no  rubs, murmurs, or gallops noted.  ABDOMEN:  Obese, nontender.  Positive bowel sounds.  EXTREMITIES:  No peripheral edema.  Distal pulses are equal and palpable.  NEURO:  Nonfocal.   The ECG reveals a sinus bradycardia at 47.  There are T-wave inversion in  AVL.  Chest x-ray reveals no acute disease.  INR is 0.9.  D-dimers were  slightly elevated at 0.54.  Total cholesterol 174, triglycerides 124, LDL  91, creatinine 1.1, potassium 4.3.  Serial cardiac enzymes have been  negative.  Hemoglobin 14, hematocrit 42.   IMPRESSION:  1. Chest pain.  As noted above, she has had two negative coronary     angiographies with the last being in November 2000, the last performed     for a false Cardiolite.  She does have large pendulous breasts which     makes imaging difficult.  We will plan for a stress Cardiolite as an     outpatient and compare to her old studies.  It is most likely that her     chest pain may be  the result of her hiatal hernia.  2. Hypertension.  Blood pressure is well controlled.  3. Mild asymptomatic bradycardia.  This has improved with Lopressor on hold.  4. Dyslipidemia.  The LDL is at goal.                                               Meade Maw, M.D.    HP/MEDQ  D:  09/09/2003  T:  09/09/2003  Job:  [id number]

## 2011-03-21 ENCOUNTER — Ambulatory Visit (HOSPITAL_COMMUNITY)
Admission: RE | Admit: 2011-03-21 | Discharge: 2011-03-21 | Disposition: A | Discharge status: Home / Self Care | Payer: Federal, State, Local not specified - PPO | Source: Ambulatory Visit | Attending: Internal Medicine | Admitting: Internal Medicine

## 2011-03-21 DIAGNOSIS — Z1231 Encounter for screening mammogram for malignant neoplasm of breast: Secondary | ICD-10-CM

## 2011-04-04 ENCOUNTER — Ambulatory Visit (INDEPENDENT_AMBULATORY_CARE_PROVIDER_SITE_OTHER): Payer: Federal, State, Local not specified - PPO | Admitting: Pulmonary Disease

## 2011-04-04 ENCOUNTER — Encounter: Payer: Self-pay | Admitting: Pulmonary Disease

## 2011-04-04 VITALS — BP 126/70 | HR 72 | Temp 98.1°F | Ht 63.0 in | Wt 267.0 lb

## 2011-04-04 DIAGNOSIS — R0600 Dyspnea, unspecified: Secondary | ICD-10-CM

## 2011-04-04 DIAGNOSIS — R0609 Other forms of dyspnea: Secondary | ICD-10-CM

## 2011-04-04 NOTE — Assessment & Plan Note (Addendum)
The pt has progressive doe over the last 3-4 mos of unknown etiology.  She has had a negative w/u so far, including myoview, labwork, and CT angio.  She has not had pfts, but has minimal smoking history and no history of asthma.  I think she does need full pfts to complete her evaluation.  More than likely this is due to her obesity and deconditioning, but will await the result of her PFT's.  I would not be surprised if she has restrictive physiology related to her body habitus.

## 2011-04-04 NOTE — Patient Instructions (Signed)
Will set up for breathing studies, and will call with results when available Work on weight loss and conditioning.

## 2011-04-04 NOTE — Progress Notes (Signed)
  Subjective:    Patient ID: Tracy Paul, female    DOB: 1951-04-21, 60 y.o.   MRN: [id number]  HPI The pt is a 59y/o female who I have been asked to see for doe.  She has had sob with exertion over the last 3-4 mos,and feels that it has been getting worse.  She describes a one block doe at moderate pace, and will get winded bringing groceries in from the car.  She does not get winded with light housework, and participates in zumba twice a week.  She describes a sensation of "having to force myself to take a deep breath". She denies significant cough, mucus, congestion, or LE edema.  She has no h/o asthma in the past, and had short lived smoking history.  She states her weight is neutral over the last one yr.     Review of Systems  Constitutional: Negative for fever and unexpected weight change.  HENT: Positive for trouble swallowing. Negative for ear pain, nosebleeds, congestion, sore throat, rhinorrhea, sneezing, dental problem, postnasal drip and sinus pressure.   Eyes: Negative for redness and itching.  Respiratory: Positive for shortness of breath. Negative for cough, chest tightness and wheezing.   Cardiovascular: Positive for chest pain and palpitations. Negative for leg swelling.  Gastrointestinal: Negative for nausea and vomiting.  Genitourinary: Negative for dysuria.  Musculoskeletal: Negative for joint swelling.  Skin: Negative for rash.  Neurological: Positive for headaches.  Hematological: Does not bruise/bleed easily.  Psychiatric/Behavioral: Negative for dysphoric mood. The patient is not nervous/anxious.        Objective:   Physical Exam Constitutional:  Obese female, no acute distress  HENT:  Nares patent without discharge  Oropharynx without exudate, palate and uvula are normal  Eyes:  Perrla, eomi, no scleral icterus  Neck:  No JVD, no TMG  Cardiovascular:  Normal rate, regular rhythm, no rubs or gallops.  No murmurs        Intact distal pulses  Pulmonary :   Normal breath sounds, no stridor or respiratory distress   No rales, rhonchi, or wheezing  Abdominal:  Soft, nondistended, bowel sounds present.  No tenderness noted.   Musculoskeletal:  No signficant lower extremity edema noted.  Lymph Nodes:  No cervical lymphadenopathy noted  Skin:  No cyanosis noted  Neurologic:  Alert, appropriate, moves all 4 extremities without obvious deficit.         Assessment & Plan:

## 2011-04-11 ENCOUNTER — Ambulatory Visit (INDEPENDENT_AMBULATORY_CARE_PROVIDER_SITE_OTHER): Payer: Federal, State, Local not specified - PPO | Admitting: Pulmonary Disease

## 2011-04-11 DIAGNOSIS — R0600 Dyspnea, unspecified: Secondary | ICD-10-CM

## 2011-04-11 DIAGNOSIS — R0609 Other forms of dyspnea: Secondary | ICD-10-CM

## 2011-04-11 LAB — PULMONARY FUNCTION TEST

## 2011-04-11 NOTE — Progress Notes (Signed)
PFT done today. 

## 2011-04-18 ENCOUNTER — Telehealth: Payer: Self-pay | Admitting: Pulmonary Disease

## 2011-04-18 NOTE — Telephone Encounter (Signed)
Please let pt know that her pfts are essentially normal.  I can find no pulmonary reason for her shortness of breath currently.  Would recommend working on conditioning and weight loss, and to let us know if no improvement after doing this for 3-4 months

## 2011-04-24 NOTE — Telephone Encounter (Signed)
[id number] 

## 2011-04-30 NOTE — Telephone Encounter (Signed)
Called and spoke with pt.  Informed her of PFT results and KC's recs.  Pt verbalized understanding and denied any questions.   

## 2011-04-30 NOTE — Telephone Encounter (Signed)
[phone]  Patient returning call.

## 2011-04-30 NOTE — Telephone Encounter (Signed)
Pt returned call. Call [phone]. Tracy Paul

## 2011-07-20 LAB — CBC
Platelets: 273
RBC: 4.09
WBC: 11 — ABNORMAL HIGH

## 2011-07-20 LAB — COMPREHENSIVE METABOLIC PANEL
ALT: 19
AST: 14
Albumin: 3.8
CO2: 28
Chloride: 105
Creatinine, Ser: 1.03
GFR calc Af Amer: >60
GFR calc non Af Amer: 56 — ABNORMAL LOW
Sodium: 138
Total Bilirubin: 0.6

## 2011-07-20 LAB — URINALYSIS, ROUTINE W REFLEX MICROSCOPIC
Protein, ur: NEGATIVE
Specific Gravity, Urine: 1.016
Urobilinogen, UA: 0.2

## 2011-07-20 LAB — DIFFERENTIAL
Basophils Absolute: 0.1
Eosinophils Absolute: 0.4
Eosinophils Relative: 4
Lymphocytes Relative: 40
Lymphs Abs: 4.4 — ABNORMAL HIGH
Monocytes Absolute: 0.7

## 2011-07-20 LAB — POCT CARDIAC MARKERS
CKMB, poc: <1 — ABNORMAL LOW
Myoglobin, poc: 29.8
Myoglobin, poc: 37.7
Operator id: 282201

## 2011-07-20 LAB — D-DIMER, QUANTITATIVE: D-Dimer, Quant: 0.55 — ABNORMAL HIGH

## 2012-02-18 ENCOUNTER — Ambulatory Visit (HOSPITAL_COMMUNITY)
Admission: RE | Admit: 2012-02-18 | Discharge: 2012-02-18 | Disposition: A | Discharge status: Home / Self Care | Payer: Federal, State, Local not specified - PPO | Source: Ambulatory Visit | Attending: Internal Medicine | Admitting: Internal Medicine

## 2012-02-18 ENCOUNTER — Other Ambulatory Visit (HOSPITAL_COMMUNITY): Payer: Self-pay | Admitting: Internal Medicine

## 2012-02-18 DIAGNOSIS — M25559 Pain in unspecified hip: Secondary | ICD-10-CM

## 2012-02-18 DIAGNOSIS — M545 Low back pain, unspecified: Secondary | ICD-10-CM | POA: Insufficient documentation

## 2012-02-20 ENCOUNTER — Other Ambulatory Visit: Payer: Self-pay | Admitting: Internal Medicine

## 2012-02-20 DIAGNOSIS — J329 Chronic sinusitis, unspecified: Secondary | ICD-10-CM

## 2012-02-22 ENCOUNTER — Ambulatory Visit
Admission: RE | Admit: 2012-02-22 | Discharge: 2012-02-22 | Disposition: A | Discharge status: Home / Self Care | Payer: Federal, State, Local not specified - PPO | Source: Ambulatory Visit | Attending: Internal Medicine | Admitting: Internal Medicine

## 2012-02-22 DIAGNOSIS — J329 Chronic sinusitis, unspecified: Secondary | ICD-10-CM

## 2012-03-21 ENCOUNTER — Emergency Department (HOSPITAL_COMMUNITY): Payer: Federal, State, Local not specified - PPO

## 2012-03-21 ENCOUNTER — Emergency Department (HOSPITAL_COMMUNITY)
Admission: EM | Admit: 2012-03-21 | Discharge: 2012-03-21 | Disposition: A | Discharge status: Home / Self Care | Payer: Federal, State, Local not specified - PPO | Attending: Emergency Medicine | Admitting: Emergency Medicine

## 2012-03-21 ENCOUNTER — Encounter (HOSPITAL_COMMUNITY): Payer: Self-pay | Admitting: *Deleted

## 2012-03-21 DIAGNOSIS — E785 Hyperlipidemia, unspecified: Secondary | ICD-10-CM | POA: Insufficient documentation

## 2012-03-21 DIAGNOSIS — Z79899 Other long term (current) drug therapy: Secondary | ICD-10-CM | POA: Insufficient documentation

## 2012-03-21 DIAGNOSIS — I10 Essential (primary) hypertension: Secondary | ICD-10-CM | POA: Insufficient documentation

## 2012-03-21 DIAGNOSIS — K219 Gastro-esophageal reflux disease without esophagitis: Secondary | ICD-10-CM | POA: Insufficient documentation

## 2012-03-21 DIAGNOSIS — R079 Chest pain, unspecified: Secondary | ICD-10-CM | POA: Insufficient documentation

## 2012-03-21 DIAGNOSIS — R55 Syncope and collapse: Secondary | ICD-10-CM | POA: Insufficient documentation

## 2012-03-21 DIAGNOSIS — E039 Hypothyroidism, unspecified: Secondary | ICD-10-CM | POA: Insufficient documentation

## 2012-03-21 DIAGNOSIS — R0602 Shortness of breath: Secondary | ICD-10-CM | POA: Insufficient documentation

## 2012-03-21 DIAGNOSIS — G43909 Migraine, unspecified, not intractable, without status migrainosus: Secondary | ICD-10-CM

## 2012-03-21 LAB — URINE MICROSCOPIC-ADD ON

## 2012-03-21 LAB — DIFFERENTIAL
Basophils Relative: 0 % (ref 0–1)
Eosinophils Absolute: 0.2 10*3/uL (ref 0.0–0.7)
Eosinophils Relative: 2 % (ref 0–5)
Lymphs Abs: 2.6 10*3/uL (ref 0.7–4.0)
Monocytes Relative: 12 % (ref 3–12)

## 2012-03-21 LAB — URINALYSIS, ROUTINE W REFLEX MICROSCOPIC
Bilirubin Urine: NEGATIVE
Glucose, UA: NEGATIVE mg/dL
Hgb urine dipstick: NEGATIVE
Ketones, ur: NEGATIVE mg/dL
Protein, ur: NEGATIVE mg/dL
Urobilinogen, UA: 0.2 mg/dL (ref 0.0–1.0)

## 2012-03-21 LAB — BASIC METABOLIC PANEL
BUN: 18 mg/dL (ref 6–23)
CO2: 24 mEq/L (ref 19–32)
Calcium: 9.7 mg/dL (ref 8.4–10.5)
Chloride: 98 mEq/L (ref 96–112)
Creatinine, Ser: 1.41 mg/dL — ABNORMAL HIGH (ref 0.50–1.10)

## 2012-03-21 LAB — D-DIMER, QUANTITATIVE: D-Dimer, Quant: 0.88 ug/mL-FEU — ABNORMAL HIGH (ref 0.00–0.48)

## 2012-03-21 LAB — CBC
HCT: 38.7 % (ref 36.0–46.0)
MCH: 30.3 pg (ref 26.0–34.0)
MCV: 89.6 fL (ref 78.0–100.0)
Platelets: 279 10*3/uL (ref 150–400)
RBC: 4.32 MIL/uL (ref 3.87–5.11)
RDW: 13.7 % (ref 11.5–15.5)
WBC: 10.4 10*3/uL (ref 4.0–10.5)

## 2012-03-21 LAB — PRO B NATRIURETIC PEPTIDE: Pro B Natriuretic peptide (BNP): 66.3 pg/mL (ref 0–125)

## 2012-03-21 MED ORDER — DIPHENHYDRAMINE HCL 25 MG PO CAPS
25.0000 mg | ORAL_CAPSULE | Freq: Once | ORAL | Status: AC
  Administered 2012-03-21: 25 mg via ORAL
  Filled 2012-03-21: qty 1

## 2012-03-21 MED ORDER — METOCLOPRAMIDE HCL 5 MG/ML IJ SOLN
10.0000 mg | Freq: Once | INTRAMUSCULAR | Status: AC
  Administered 2012-03-21: 10 mg via INTRAVENOUS
  Filled 2012-03-21: qty 2

## 2012-03-21 MED ORDER — DEXAMETHASONE SODIUM PHOSPHATE 10 MG/ML IJ SOLN
10.0000 mg | Freq: Once | INTRAMUSCULAR | Status: AC
  Administered 2012-03-21: 10 mg via INTRAVENOUS
  Filled 2012-03-21: qty 1

## 2012-03-21 MED ORDER — SODIUM CHLORIDE 0.9 % IV SOLN
INTRAVENOUS | Status: DC
  Administered 2012-03-21: 125 mL/h via INTRAVENOUS

## 2012-03-21 MED ORDER — TECHNETIUM TO 99M ALBUMIN AGGREGATED
6.0000 | Freq: Once | INTRAVENOUS | Status: AC | PRN
  Administered 2012-03-21: 6 via INTRAVENOUS

## 2012-03-21 NOTE — ED Provider Notes (Cosign Needed)
History     CSN: [id number]  Arrival date & time 03/21/12  4098   First MD Initiated Contact with Patient 03/21/12 [phone]      Chief Complaint  Patient presents with  . Chest Pain  . Shortness of Breath  . Dizziness    (Consider location/radiation/quality/duration/timing/severity/associated sxs/prior treatment) HPI Comments: The patient is a 61 year old woman who was at work at 7:30 this morning. She developed weakness and nearly fainted. She had chest pain, which feels like a pressure feeling that goes through to her back. She also has shortness of breath and cough. She complains is a severe bifrontal headache. There was no precipitating event. However, her husband says that she has been started on Topamax by a neurologist yesterday. She is feeling less chest pain now but still has a bad headache. There's been no fever. She had a headache last night as well. She has a prior history of hypertension, high cholesterol, and hypothyroidism. She does not smoke or drink.  Patient is a 61 y.o. female presenting with syncope. The history is provided by the patient and medical records. No language interpreter was used.  Loss of Consciousness This is a new problem. The current episode started 1 to 2 hours ago. Episode frequency: She had a near syncopal episode at work. The problem has been gradually improving. Associated symptoms include chest pain, headaches and shortness of breath. The symptoms are aggravated by nothing. The symptoms are relieved by nothing. She has tried nothing for the symptoms.    Past Medical History  Diagnosis Date  . Hypertension   . Hyperlipidemia   . Hypothyroidism   . Thyroid nodule   . GERD (gastroesophageal reflux disease)   . Chest pain     History of normal cardiac catheterization x2; last heart catheter 2000; echocardiogram November 2009: EF 60%; LAE;    Myoview 3/12: EF 60%, no ischemia  . Allergic rhinitis   . Chronic headache     Past Surgical History    Procedure Date  . Bilateral carpal tunnel surgery   . Replacement total knee bilateral     bilat  . Nasal sinus surgery   . Tubal ligation     Family History  Problem Relation Age of Onset  . Breast cancer Mother   . Heart attack Father   . Heart attack Sister     History  Substance Use Topics  . Smoking status: Former Smoker -- 0.8 packs/day for 10 years    Types: Cigarettes    Quit date: 10/30/1979  . Smokeless tobacco: Not on file  . Alcohol Use: No    OB History    Grav Para Term Preterm Abortions TAB SAB Ect Mult Living                  Review of Systems  Constitutional: Negative.  Negative for fever and chills.  Eyes: Negative.   Respiratory: Positive for cough and shortness of breath.   Cardiovascular: Positive for chest pain and syncope.       Near syncope   Gastrointestinal: Positive for nausea. Negative for vomiting.  Genitourinary: Negative.   Musculoskeletal: Negative.   Skin: Negative.   Neurological: Positive for dizziness and headaches. Syncope: near-syncope.  Psychiatric/Behavioral: Negative.     Allergies  Codeine  Home Medications   Current Outpatient Rx  Name Route Sig Dispense Refill  . ACETAMINOPHEN 500 MG PO TABS Oral Take 500 mg by mouth every 6 (six) hours as needed.      Marland Kitchen  ASPIRIN 81 MG PO TABS Oral Take 81 mg by mouth daily.      Marland Kitchen CALCIUM CARBONATE ANTACID 750 MG PO CHEW Oral Chew 2 tablets by mouth daily.      Marland Kitchen CALCIUM 600 + D PO Oral Take 1 tablet by mouth daily.      Marland Kitchen VITAMIN D3 50000 UNITS PO CAPS Oral Take by mouth. I take one by mouth on Sunday, Monday, Wednesday, Friday, Saturday     . DIPHENHYDRAMINE-APAP (SLEEP) 25-500 MG PO TABS Oral Take 1 tablet by mouth at bedtime.      . OMEGA-3 FATTY ACIDS 1000 MG PO CAPS Oral Take 1 g by mouth daily.      Marland Kitchen LEVOTHYROXINE SODIUM 100 MCG PO TABS  Take one half tablet by mouth on Sunday, Tuesday, Wednesday, Friday, Saturday and one tablet on Mondays and Thursdays    .  LISINOPRIL-HYDROCHLOROTHIAZIDE 20-25 MG PO TABS Oral Take 1 tablet by mouth daily.      Marland Kitchen METHOCARBAMOL 750 MG PO TABS Oral Take 1 tablet by mouth every 6 (six) hours as needed.     Marland Kitchen MONTELUKAST SODIUM 10 MG PO TABS Oral Take 10 mg by mouth as needed.      . OXYCODONE-ACETAMINOPHEN 5-325 MG PO TABS Oral Take 1 tablet by mouth every 4 (four) hours as needed.      Marland Kitchen POTASSIUM GLUCONATE 595 MG PO TABS Oral Take 595 mg by mouth daily.      Marland Kitchen PRAVASTATIN SODIUM 40 MG PO TABS Oral Take 40 mg by mouth daily.        BP 115/65  Pulse 80  Temp(Src) 97.8 F (36.6 C) (Oral)  Resp 18  Ht 5\' 4"  (1.626 m)  Wt 246 lb (111.585 kg)  BMI 42.23 kg/m2  SpO2 98%  Physical Exam  Nursing note and vitals reviewed. Constitutional: She is oriented to person, place, and time. She appears well-developed and well-nourished. Distressed: in mild to moderate distress with headache.  HENT:  Head: Normocephalic and atraumatic.  Right Ear: External ear normal.  Left Ear: External ear normal.  Mouth/Throat: Oropharynx is clear and moist.  Eyes: Conjunctivae and EOM are normal. Pupils are equal, round, and reactive to light. No scleral icterus.  Neck: Normal range of motion. Neck supple.  Cardiovascular: Normal rate, regular rhythm and normal heart sounds.   Pulmonary/Chest: Effort normal and breath sounds normal.  Abdominal: Soft. Bowel sounds are normal.  Musculoskeletal: Normal range of motion. She exhibits no edema and no tenderness.  Neurological: She is alert and oriented to person, place, and time.       No sensory or motor deficit.  Skin: Skin is warm and dry.  Psychiatric: She has a normal mood and affect. Her behavior is normal.    ED Course  Procedures (including critical care time)   Labs Reviewed  CBC  BASIC METABOLIC PANEL   8:65 AM  Date: 03/21/2012  Rate: 78  Rhythm: normal sinus rhythm  QRS Axis: normal  Intervals: normal QRS:  Low voltage in frontal leads.  Poor R wave progression in  precordial leads suggests possible old anterior myocardial infarction.  ST/T Wave abnormalities: normal  Conduction Disutrbances:none  Narrative Interpretation: Abnormal EKG.  Old EKG Reviewed: none available 9:57 AM Patient was seen and had physical examination. Laboratory tests were ordered. EKG was benign. Medication for headache with Reglan, dexamethasone, and Benadryl was ordered. Old charts were reviewed.  12:26 PM Lab tests were reviewed with the patient. Her CBC  was normal her chemistry showed a slightly elevated creatinine. Cardiac enzymes and BNP were normal. D-dimer was mildly elevated at 0.88. This will be further worked up with a ventilation/perfusion scan. CT of the brain and chest x-ray were good.  1:59 PM V/Q scan was negative.  Reassured and released.  Safe to go home.  Can try the Topamax in the morning to see if the Topamax caused her headache.    1. Near syncope   2. Migraine headache          Carleene Cooper III, MD 03/21/12 1427  Carleene Cooper III, MD 03/21/12 [phone]

## 2012-03-21 NOTE — Discharge Instructions (Signed)
Mrs. Tracy Paul, you had physical exam, laboratory tests, CT x-ray of the brain, chest x-ray, and blood scan to check on UF. Nearly fainted and had a severe headache. He were treated for headache with migraine medication, and her headache has resolved. He is safe to go home. He should rest in a darkened room today. He can try Topamax again tomorrow during the day to see if that causes her headache. He should make a followup appointment with your internist, [name], M.D.

## 2012-03-21 NOTE — ED Notes (Signed)
Patient states she was at work and had onset of dizziness, sob, and chest pain.  She is a Conservation officer, nature.  Patient denies hx of mi.  Patient is seen by [facility]

## 2012-03-22 LAB — URINE CULTURE
Colony Count: NO GROWTH
Culture  Setup Time: 201305241221
Culture: NO GROWTH

## 2012-05-23 ENCOUNTER — Ambulatory Visit (INDEPENDENT_AMBULATORY_CARE_PROVIDER_SITE_OTHER): Payer: Federal, State, Local not specified - PPO | Admitting: Surgery

## 2012-06-03 ENCOUNTER — Other Ambulatory Visit (HOSPITAL_COMMUNITY): Payer: Self-pay | Admitting: Internal Medicine

## 2012-06-03 ENCOUNTER — Ambulatory Visit (HOSPITAL_COMMUNITY)
Admission: RE | Admit: 2012-06-03 | Discharge: 2012-06-03 | Disposition: A | Discharge status: Home / Self Care | Payer: Federal, State, Local not specified - PPO | Source: Ambulatory Visit | Attending: Internal Medicine | Admitting: Internal Medicine

## 2012-06-03 DIAGNOSIS — M545 Low back pain, unspecified: Secondary | ICD-10-CM

## 2012-06-03 DIAGNOSIS — M51379 Other intervertebral disc degeneration, lumbosacral region without mention of lumbar back pain or lower extremity pain: Secondary | ICD-10-CM | POA: Insufficient documentation

## 2012-06-03 DIAGNOSIS — G9589 Other specified diseases of spinal cord: Secondary | ICD-10-CM | POA: Insufficient documentation

## 2012-06-03 DIAGNOSIS — M5137 Other intervertebral disc degeneration, lumbosacral region: Secondary | ICD-10-CM | POA: Insufficient documentation

## 2012-06-18 ENCOUNTER — Ambulatory Visit (INDEPENDENT_AMBULATORY_CARE_PROVIDER_SITE_OTHER): Payer: Federal, State, Local not specified - PPO | Admitting: Surgery

## 2012-06-18 ENCOUNTER — Other Ambulatory Visit (INDEPENDENT_AMBULATORY_CARE_PROVIDER_SITE_OTHER): Payer: Self-pay | Admitting: General Surgery

## 2012-06-18 ENCOUNTER — Encounter (INDEPENDENT_AMBULATORY_CARE_PROVIDER_SITE_OTHER): Payer: Self-pay | Admitting: Surgery

## 2012-06-18 VITALS — BP 150/86 | HR 55 | Temp 97.4°F | Ht 63.0 in | Wt 271.6 lb

## 2012-06-18 DIAGNOSIS — Z1231 Encounter for screening mammogram for malignant neoplasm of breast: Secondary | ICD-10-CM

## 2012-06-18 DIAGNOSIS — E669 Obesity, unspecified: Secondary | ICD-10-CM

## 2012-06-18 NOTE — Patient Instructions (Addendum)
Followup with [facility] for scheduling tests.

## 2012-06-18 NOTE — Progress Notes (Signed)
Chief Complaint:  Morbid obesity BMI 48   History of Present Illness:  Tracy Paul is an 61 y.o. female of Dr. Marlowe Shores.  She comes in today for initial bariatric encounter with a weight of 270 1 PM a 48. 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 She has never had any abdominal operations and has had bilateral knees and bilateral carpal tunnel procedures done. She has had hypertension since 1995. She said dyslipidemia since 2005. She's had GERD for which he intermittently treats with Citracal. She says several chronic sinus infections. She had a right thyroid nodule removed by the Kocher tendon 2007 has been on suppressive Synthroid.  Past Medical History  Diagnosis Date  . Hypertension   . Hyperlipidemia   . Hypothyroidism   . Thyroid nodule   . GERD (gastroesophageal reflux disease)   . Chest pain     History of normal cardiac catheterization x2; last heart catheter 2000; echocardiogram November 2009: EF 60%; LAE;    Myoview 3/12: EF 60%, no ischemia  . Allergic rhinitis   . Chronic headache     Past Surgical History  Procedure Date  . Bilateral carpal tunnel surgery   . Replacement total knee bilateral     bilat  . Nasal sinus surgery   . Tubal ligation     Current Outpatient Prescriptions  Medication Sig Dispense Refill  . acetaminophen (TYLENOL) 500 MG tablet Take 500 mg by mouth every 6 (six) hours as needed. For pain      . aspirin 81 MG tablet Take 81 mg by mouth daily.        . Calcium Carbonate-Vitamin D (CALCIUM 600 + D PO) Take 1 tablet by mouth daily.        . Cholecalciferol (VITAMIN D3) 50000 UNITS CAPS Take by mouth. I take one by mouth on Sunday, Monday, Wednesday, Friday, Saturday      . diphenhydramine-acetaminophen (TYLENOL PM) 25-500 MG TABS Take 1 tablet by mouth at bedtime.       . fish oil-omega-3 fatty acids 1000 MG capsule Take 1 g by mouth daily.        Marland Kitchen levothyroxine (SYNTHROID, LEVOTHROID)  100 MCG tablet Take one half tablet by mouth on Sunday, Tuesday, Wednesday, Friday, Saturday and one tablet on Mondays and Thursdays      . lisinopril-hydrochlorothiazide (PRINZIDE,ZESTORETIC) 20-25 MG per tablet Take 1 tablet by mouth daily.        . potassium gluconate 595 MG TABS Take 595 mg by mouth daily.        . pravastatin (PRAVACHOL) 40 MG tablet Take 40 mg by mouth daily.        . Topiramate (TOPAMAX PO) Take 1 tablet by mouth 2 (two) times daily. For migraine prevention       Codeine Family History  Problem Relation Age of Onset  . Breast cancer Mother   . Cancer Mother     breast cancer  . Anuerysm Mother     brain anuerysm  . Heart attack Father   . Heart disease Father   . Heart attack Sister    Social History:   reports that she quit smoking about 32 years ago. Her smoking use included Cigarettes. She has a 8 pack-year smoking history. She does not have any smokeless tobacco history on file. She reports that she does not drink alcohol or use illicit drugs.   REVIEW OF SYSTEMS - PERTINENT POSITIVES  ONLY: See above   Physical Exam:   Blood pressure 150/86, pulse 55, temperature 97.4 F (36.3 C), temperature source Temporal, height 5\' 3"  (1.6 m), weight 271 lb 9.6 oz (123.197 kg), SpO2 98.00%. Body mass index is 48.11 kg/(m^2).  Gen:  WDWN WF NAD  Neurological: Alert and oriented to person, place, and time. Motor and sensory function is grossly intact  Head: Normocephalic and atraumatic.  Eyes: Conjunctivae are normal. Pupils are equal, round, and reactive to light. No scleral icterus.  Neck: Normal range of motion. Neck supple. No tracheal deviation or thyromegaly present.  Cardiovascular:  SR without murmurs or gallops.  No carotid bruits Respiratory: Effort normal.  No respiratory distress. No chest wall tenderness. Breath sounds normal.  No wheezes, rales or rhonchi.  Abdomen:  No abdominal surgery GU: Musculoskeletal: Normal range of motion. Extremities are  nontender. No cyanosis, edema or clubbing noted Lymphadenopathy: No cervical, preauricular, postauricular or axillary adenopathy is present Skin: Skin is warm and dry. No rash noted. No diaphoresis. No erythema. No pallor. Pscyh: Normal mood and affect. Behavior is normal. Judgment and thought content normal.   LABORATORY RESULTS: No results found for this or any previous visit (from the past 48 hour(s)).  RADIOLOGY RESULTS: No results found.  Problem List: Patient Active Problem List  Diagnosis  . HYPERTENSION  . ABNORMAL HEART RHYTHMS  . RHINOSINUSITIS, CHRONIC  . ALLERGIC RHINITIS  . HEADACHE, CHRONIC  . Hypertension  . Hyperlipidemia  . Hypothyroidism  . Chest pain  . GERD (gastroesophageal reflux disease)  . Dizziness  . Palpitations  . DIZZINESS  . CHEST PAIN UNSPECIFIED  . Dyspnea    Assessment & Plan: Morbid obesity.  Plan workup for roux en Y gastric bypass    Matt B. Daphine Deutscher, MD, [facility] [phone] beeper [phone]  06/18/2012 5:00 PM

## 2012-06-19 ENCOUNTER — Other Ambulatory Visit (INDEPENDENT_AMBULATORY_CARE_PROVIDER_SITE_OTHER): Payer: Self-pay | Admitting: General Surgery

## 2012-06-19 DIAGNOSIS — E669 Obesity, unspecified: Secondary | ICD-10-CM

## 2012-06-26 ENCOUNTER — Telehealth (INDEPENDENT_AMBULATORY_CARE_PROVIDER_SITE_OTHER): Payer: Self-pay | Admitting: General Surgery

## 2012-06-26 NOTE — Telephone Encounter (Signed)
Called patient and returned call based on message left. Advised her that the test ordered has to be performed. Advised the test she had last year was in June and not valid. Patient agreed.

## 2012-06-27 ENCOUNTER — Telehealth (INDEPENDENT_AMBULATORY_CARE_PROVIDER_SITE_OTHER): Payer: Self-pay | Admitting: *Deleted

## 2012-06-27 NOTE — Telephone Encounter (Signed)
[facility] for patient to call back.Marland Kitchen

## 2012-06-27 NOTE — Telephone Encounter (Signed)
Message copied by Barrie Dunker on Fri Jun 27, 2012  9:52 AM ------      Message from: Zacarias Pontes      Created: Thu Jun 26, 2012 12:04 PM       PT IS SCHED FOR A BREATH/TEK APT ON 9/6 .Marland KitchenMarland KitchenSHE HAD A BREATHING TEST WITHIN THE LAST YEAR IS THIS THE SAME THING AND DOES SHE NEED ANOTHER [phone]

## 2012-07-01 ENCOUNTER — Ambulatory Visit (HOSPITAL_COMMUNITY)
Admission: RE | Admit: 2012-07-01 | Discharge: 2012-07-01 | Disposition: A | Discharge status: Home / Self Care | Payer: Federal, State, Local not specified - PPO | Source: Ambulatory Visit | Attending: Surgery | Admitting: Surgery

## 2012-07-01 ENCOUNTER — Encounter (HOSPITAL_COMMUNITY)
Admission: RE | Disposition: A | Discharge status: Home / Self Care | Payer: Self-pay | Source: Ambulatory Visit | Attending: Surgery

## 2012-07-01 ENCOUNTER — Other Ambulatory Visit (INDEPENDENT_AMBULATORY_CARE_PROVIDER_SITE_OTHER): Payer: Self-pay | Admitting: Surgery

## 2012-07-01 DIAGNOSIS — Z01818 Encounter for other preprocedural examination: Secondary | ICD-10-CM | POA: Insufficient documentation

## 2012-07-01 HISTORY — PX: BREATH TEK H PYLORI: SHX5422

## 2012-07-01 LAB — COMPREHENSIVE METABOLIC PANEL
AST: 18 U/L (ref 0–37)
Albumin: 4 g/dL (ref 3.5–5.2)
BUN: 16 mg/dL (ref 6–23)
CO2: 27 mEq/L (ref 19–32)
Calcium: 9.6 mg/dL (ref 8.4–10.5)
Chloride: 106 mEq/L (ref 96–112)
Creat: 1.03 mg/dL (ref 0.50–1.10)
Glucose, Bld: 109 mg/dL — ABNORMAL HIGH (ref 70–99)
Potassium: 4.5 mEq/L (ref 3.5–5.3)

## 2012-07-01 LAB — LIPID PANEL
Cholesterol: 186 mg/dL (ref 0–200)
HDL: 48 mg/dL (ref >39)
Total CHOL/HDL Ratio: 3.9 Ratio
Triglycerides: 83 mg/dL (ref <150)

## 2012-07-01 LAB — T4: T4, Total: 9.9 ug/dL (ref 5.0–12.5)

## 2012-07-01 LAB — CBC
HCT: 33.6 % — ABNORMAL LOW (ref 36.0–46.0)
Hemoglobin: 11.5 g/dL — ABNORMAL LOW (ref 12.0–15.0)
MCV: 88.4 fL (ref 78.0–100.0)
RBC: 3.8 MIL/uL — ABNORMAL LOW (ref 3.87–5.11)
WBC: 5.9 10*3/uL (ref 4.0–10.5)

## 2012-07-01 SURGERY — BREATH TEST, FOR HELICOBACTER PYLORI

## 2012-07-02 ENCOUNTER — Encounter (HOSPITAL_COMMUNITY): Payer: Self-pay | Admitting: General Surgery

## 2012-07-05 ENCOUNTER — Ambulatory Visit: Payer: Federal, State, Local not specified - PPO | Admitting: *Deleted

## 2012-07-08 ENCOUNTER — Ambulatory Visit (HOSPITAL_BASED_OUTPATIENT_CLINIC_OR_DEPARTMENT_OTHER): Payer: Federal, State, Local not specified - PPO | Attending: Surgery | Admitting: Radiology

## 2012-07-08 VITALS — Ht 63.0 in | Wt 267.0 lb

## 2012-07-08 DIAGNOSIS — E669 Obesity, unspecified: Secondary | ICD-10-CM

## 2012-07-08 DIAGNOSIS — G4733 Obstructive sleep apnea (adult) (pediatric): Secondary | ICD-10-CM | POA: Insufficient documentation

## 2012-07-09 ENCOUNTER — Ambulatory Visit: Payer: Federal, State, Local not specified - PPO | Admitting: *Deleted

## 2012-07-12 DIAGNOSIS — G4733 Obstructive sleep apnea (adult) (pediatric): Secondary | ICD-10-CM

## 2012-07-13 NOTE — Procedures (Signed)
NAMELAURENA, Tracy Paul              ACCOUNT NO.:  [id number]  MEDICAL RECORD NO.:  [id number]          PATIENT TYPE:  OUT  LOCATION:  SLEEP CENTER                 FACILITY:  [facility]  PHYSICIAN:  Clinton D. Maple Hudson, MD, FCCP, FACPDATE OF BIRTH:  12/18/1951  DATE OF STUDY:  07/08/2012                           NOCTURNAL POLYSOMNOGRAM  REFERRING PHYSICIAN:  Thornton Park. Daphine Deutscher, MD  INDICATION FOR STUDY:  Hypersomnia with sleep apnea.  EPWORTH SLEEPINESS SCORE:  Endorsed as 0 by the patient.  BMI 47.3, weight 267 pounds, height 63 inches, neck 15.5 inches.  MEDICATIONS:  Home medications are charted and reviewed.  SLEEP ARCHITECTURE:  Total sleep time 311.5 minutes with sleep efficiency 82.6%.  Stage I was 7.9%, stage II 72.1%, stage III 0.6%, REM 19.4% of total sleep time.  Sleep latency 14 minutes, REM latency 59.5 minutes, awake after sleep onset 51.5 minutes, arousal index 15.  Bedtime Medication:  None.  RESPIRATORY DATA:  Apnea-hypopnea index (AHI) 5.6 per hour.  A total of 29 events was scored including 9 obstructive apneas and 20 hypopneas. All events were associated with nonsupine sleep position.  REM/AHI 3 per hour.  There were insufficient numbers of events to meet protocol requirements for initiation of split protocol, CPAP titration on this study.  OXYGEN DATA:  Moderate snoring with oxygen desaturation to a nadir of 88% and mean oxygen saturation through the study of 93.4% on room air.  CARDIAC DATA:  Sinus rhythm with occasional PVC.  MOVEMENT-PARASOMNIA:  A total of 51 limb jerks were counted, of which 9 were associated with arousals or awakenings for periodic limb movement with arousal index of 1.7 per hour. Bathroom x1.  IMPRESSIONS-RECOMMENDATIONS:  Minimal obstructive sleep apnea/hypopnea syndrome, AHI 5.6 per hour (the normal range for adults is from 0-5 events per hour).  Moderate snoring with oxygen desaturation to a nadir of 88% and mean oxygen saturation  through the study of 93.4% on room air.  There were not enough events to permit application of split protocol CPAP titration.  Scores in this range would usually be addressed with conservative measures including weight loss where appropriate.     Clinton D. Maple Hudson, MD, [facility], FACP Diplomate, American Board of Sleep Medicine    CDY/MEDQ  D:  07/12/2012 10:21:18  T:  07/13/2012 07:40:16  Job:  [id number]

## 2012-07-25 ENCOUNTER — Ambulatory Visit (HOSPITAL_COMMUNITY)
Admission: RE | Admit: 2012-07-25 | Discharge: 2012-07-25 | Disposition: A | Discharge status: Home / Self Care | Payer: Federal, State, Local not specified - PPO | Source: Ambulatory Visit | Attending: Surgery | Admitting: Surgery

## 2012-07-25 ENCOUNTER — Other Ambulatory Visit: Payer: Self-pay

## 2012-07-25 DIAGNOSIS — E669 Obesity, unspecified: Secondary | ICD-10-CM

## 2012-07-25 DIAGNOSIS — I10 Essential (primary) hypertension: Secondary | ICD-10-CM | POA: Insufficient documentation

## 2012-07-25 DIAGNOSIS — E039 Hypothyroidism, unspecified: Secondary | ICD-10-CM | POA: Insufficient documentation

## 2012-07-25 DIAGNOSIS — E785 Hyperlipidemia, unspecified: Secondary | ICD-10-CM | POA: Insufficient documentation

## 2012-07-25 DIAGNOSIS — K219 Gastro-esophageal reflux disease without esophagitis: Secondary | ICD-10-CM | POA: Insufficient documentation

## 2012-07-25 DIAGNOSIS — Z6841 Body Mass Index (BMI) 40.0 and over, adult: Secondary | ICD-10-CM | POA: Insufficient documentation

## 2012-07-25 DIAGNOSIS — Z1231 Encounter for screening mammogram for malignant neoplasm of breast: Secondary | ICD-10-CM | POA: Insufficient documentation

## 2012-07-25 DIAGNOSIS — Z13820 Encounter for screening for osteoporosis: Secondary | ICD-10-CM | POA: Insufficient documentation

## 2012-07-25 DIAGNOSIS — K824 Cholesterolosis of gallbladder: Secondary | ICD-10-CM | POA: Insufficient documentation

## 2012-07-28 ENCOUNTER — Ambulatory Visit: Payer: Federal, State, Local not specified - PPO | Admitting: *Deleted

## 2012-08-04 ENCOUNTER — Encounter: Payer: Self-pay | Admitting: *Deleted

## 2012-08-04 ENCOUNTER — Encounter: Payer: Federal, State, Local not specified - PPO | Attending: Surgery | Admitting: *Deleted

## 2012-08-04 VITALS — Ht 63.0 in | Wt 270.2 lb

## 2012-08-04 DIAGNOSIS — E669 Obesity, unspecified: Secondary | ICD-10-CM

## 2012-08-04 DIAGNOSIS — Z01818 Encounter for other preprocedural examination: Secondary | ICD-10-CM | POA: Insufficient documentation

## 2012-08-04 DIAGNOSIS — Z713 Dietary counseling and surveillance: Secondary | ICD-10-CM | POA: Insufficient documentation

## 2012-08-04 NOTE — Progress Notes (Signed)
  Pre-Op Assessment Visit:  Pre-Operative RYGB Surgery  Medical Nutrition Therapy:  Appt start time: 0800   End time:  0900.  Patient was seen on 08/04/2012 for Pre-Operative RYGB Nutrition Assessment. Assessment and letter of approval faxed to [facility] Bariatric Surgery Program coordinator on 08/04/2012.  Approval letter sent to [facility] center and will be available in the chart under the media tab.  Handouts given during visit include:  Pre-Op Goals   Bariatric Surgery Protein Shakes  Patient to call for Pre-Op and Post-Op Nutrition Education at the [facility] when surgery is scheduled.

## 2012-08-04 NOTE — Patient Instructions (Addendum)
   Follow Pre-Op Nutrition Goals to prepare for Gastric Bypass Surgery.   Call the [facility] at [phone] once you have been given your surgery date to enrolled in the Pre-Op Nutrition Class. You will need to attend this nutrition class 3-4 weeks prior to your surgery. 

## 2012-12-03 ENCOUNTER — Telehealth (INDEPENDENT_AMBULATORY_CARE_PROVIDER_SITE_OTHER): Payer: Self-pay

## 2012-12-03 NOTE — Telephone Encounter (Signed)
Returned pt's call regarding which medications she should stop taking prior to Sx.  After looking at her medications list the only medication I see that she needs to stop is her 81mg  aspirin.  I told the pt that her other medications were fine to take up until Sx, but that she will need to stop her aspirin 5 days before Sx.

## 2012-12-04 ENCOUNTER — Encounter: Payer: Federal, State, Local not specified - PPO | Attending: Surgery | Admitting: *Deleted

## 2012-12-04 VITALS — Ht 63.0 in | Wt 269.4 lb

## 2012-12-04 DIAGNOSIS — Z713 Dietary counseling and surveillance: Secondary | ICD-10-CM | POA: Insufficient documentation

## 2012-12-04 DIAGNOSIS — E669 Obesity, unspecified: Secondary | ICD-10-CM

## 2012-12-04 DIAGNOSIS — Z01818 Encounter for other preprocedural examination: Secondary | ICD-10-CM | POA: Insufficient documentation

## 2012-12-05 NOTE — Progress Notes (Signed)
Bariatric Class:  Appt start time: 1730 end time:  1830.  Pre-Operative Nutrition Class  Patient was seen on 12/04/12 for Pre-Operative Bariatric Surgery Education at [facility].   Surgery date: 12/16/12 Surgery type: RYGB Start weight at [facility]: 270.2 lbs (08/04/12)  Weight today: 269.4 lbs BMI: 47.7 kg/m^2 Goal weight: 145-150 lbs  Samples given per MNT protocol: 1 each Bariatric Advantage Multivitamin Lot # [id number]; Exp: 06/15  Bariatric Advantage Calcium Citrate Lot # [id number]; Exp:10/15  Bariatric Advantage Sublingual B12 Lot # [id number]; Exp:10/15  Celebrate Vitamins Multivitamin Complete (w/ iron) Lot # [id number]; Exp: 11/14  Celebrate Vitamins Multivitamin Lot # [id number]; Exp: 07/15  Celebrate Vitamins Iron + C (30 mg) Lot # [id number]; Exp: 09/15  Celebrate Vitamins Calcium Citrate Lot # [id number]; Exp: 09/15  Unjury Protein Powder Lot # [id number]; Exp: 03/15  Premier Protein Shake Lot # [id number]; Exp: 09/05/13  The following the learning objective met by the patient during this course:  Identifies Pre-Op Dietary Goals and will begin 2 weeks pre-operatively  Identifies appropriate sources of fluids and proteins   States protein recommendations and appropriate sources pre and post-operatively  Identifies Post-Operative Dietary Goals and will follow for 2 weeks post-operatively  Identifies appropriate multivitamin and calcium sources  Describes the need for physical activity post-operatively and will follow MD recommendations  States when to call healthcare provider regarding medication questions or post-operative complications  Handouts given during class include:  Pre-Op Bariatric Surgery Diet Handout  Protein Shake Handout  Post-Op Bariatric Surgery Nutrition Handout  BELT Program Information Flyer  Support Group Information Flyer  [facility] Bariatric Supplements Price List  Follow-Up Plan: Patient will follow-up at  [facility] 2 weeks post operatively for diet advancement per MD.

## 2012-12-05 NOTE — Patient Instructions (Signed)
Follow:   Pre-Op Diet per MD 2 weeks prior to surgery  Phase 2- Liquids (clear/full) 2 weeks after surgery  Vitamin/Mineral/Calcium guidelines for purchasing bariatric supplements  Exercise guidelines pre and post-op per MD  Follow-up at [facility] in 2 weeks post-op for diet advancement. Contact Fae Blossom as needed with questions/concerns. 

## 2012-12-05 NOTE — Progress Notes (Signed)
Dr. Daphine Deutscher : We need orders on Iracema Lanagan please, DOS is 12/16/12 - coming for preop Thurs 12/11/12 Thank you

## 2012-12-08 ENCOUNTER — Encounter (HOSPITAL_COMMUNITY): Payer: Self-pay | Admitting: Pharmacy Technician

## 2012-12-09 NOTE — Progress Notes (Signed)
Need MD order entry in Epic for Presurgical testing visit 12-11-12. W. Kennon Portela

## 2012-12-10 ENCOUNTER — Ambulatory Visit (INDEPENDENT_AMBULATORY_CARE_PROVIDER_SITE_OTHER): Payer: Federal, State, Local not specified - PPO | Admitting: Surgery

## 2012-12-10 ENCOUNTER — Encounter (INDEPENDENT_AMBULATORY_CARE_PROVIDER_SITE_OTHER): Payer: Self-pay | Admitting: Surgery

## 2012-12-10 ENCOUNTER — Encounter (HOSPITAL_COMMUNITY): Payer: Self-pay

## 2012-12-10 ENCOUNTER — Encounter (HOSPITAL_COMMUNITY)
Admission: RE | Admit: 2012-12-10 | Discharge: 2012-12-10 | Disposition: A | Discharge status: Home / Self Care | Payer: Federal, State, Local not specified - PPO | Source: Ambulatory Visit | Attending: Surgery | Admitting: Surgery

## 2012-12-10 VITALS — BP 142/86 | HR 88 | Temp 99.0°F | Resp 18 | Ht 64.0 in | Wt 266.4 lb

## 2012-12-10 DIAGNOSIS — E785 Hyperlipidemia, unspecified: Secondary | ICD-10-CM

## 2012-12-10 DIAGNOSIS — Z6841 Body Mass Index (BMI) 40.0 and over, adult: Secondary | ICD-10-CM

## 2012-12-10 DIAGNOSIS — I10 Essential (primary) hypertension: Secondary | ICD-10-CM

## 2012-12-10 DIAGNOSIS — E669 Obesity, unspecified: Secondary | ICD-10-CM

## 2012-12-10 LAB — CBC
HCT: 39.4 % (ref 36.0–46.0)
Hemoglobin: 13 g/dL (ref 12.0–15.0)
RBC: 4.36 MIL/uL (ref 3.87–5.11)
WBC: 7.3 10*3/uL (ref 4.0–10.5)

## 2012-12-10 LAB — BASIC METABOLIC PANEL
BUN: 25 mg/dL — ABNORMAL HIGH (ref 6–23)
CO2: 25 mEq/L (ref 19–32)
Chloride: 99 mEq/L (ref 96–112)
Glucose, Bld: 97 mg/dL (ref 70–99)
Potassium: 4.3 mEq/L (ref 3.5–5.1)

## 2012-12-10 LAB — SURGICAL PCR SCREEN: Staphylococcus aureus: NEGATIVE

## 2012-12-10 NOTE — Patient Instructions (Addendum)
20 YESSIKA OTTE  12/10/2012   Your procedure is scheduled on:  12/16/12  TUESDAY  Report to [facility] at  1030     AM.  Call this number if you have problems the morning of surgery: [phone]       Remember:   Do not eat food  Or drink :After Midnight. Monday NIGHT OR AS DIRECTED BY OFFICE FOR BOWEL PREP/     NOTHING AFTER MIDNIGHT Monday NIGHT   Take these medicines the morning of surgery with A SIP OF WATER:Synthroid       May take neurontin if needed   .  Contacts, dentures or partial plates can not be worn to surgery  Leave suitcase in the car. After surgery it may be brought to your room.  For patients admitted to the hospital, checkout time is 11:00 AM day of  discharge.             SPECIAL INSTRUCTIONS- SEE [place] PREPARING FOR SURGERY INSTRUCTION SHEET-     DO NOT WEAR JEWELRY, LOTIONS, POWDERS, OR PERFUMES.  WOMEN-- DO NOT SHAVE LEGS OR UNDERARMS FOR 12 HOURS BEFORE SHOWERS. MEN MAY SHAVE FACE.  Patients discharged the day of surgery will not be allowed to drive home. IF going home the day of surgery, you must have a driver and someone to stay with you for the first 24 hours  Name and phone number of your driver:   Husband or cousin                                                                     Please read over the following fact sheets that you were given: MRSA Information, Incentive Spirometry Sheet, Blood Transfusion Sheet  Information                                                                                   Kyanne Rials  PST [phone]  [phone]                 FAILURE TO FOLLOW THESE INSTRUCTIONS MAY RESULT IN  CANCELLATION   OF YOUR SURGERY                                                  Patient Signature _____________________________

## 2012-12-10 NOTE — Patient Instructions (Addendum)

## 2012-12-10 NOTE — Progress Notes (Signed)
EKG 9/13 EPIC,  Per cardiology office note 3/12- had negative stress test and holter monitor(unable to access in Epic, but office note there)  Chest 8/13 EPIC, sleep study  9/13  EPIC

## 2012-12-10 NOTE — Progress Notes (Signed)
Chief Complaint: Morbid obesity BMI 48  History of Present Illness: Tracy Paul is an 62 y.o. female of Dr. Bill McKeown. She comes in today for initial bariatric encounter with a weight of 270 1 PM a 48. 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 She has never had any abdominal operations and has had bilateral knees and bilateral carpal tunnel procedures done. She has had hypertension since 1995. She said dyslipidemia since 2005. She's had GERD for which he intermittently treats with Citracal. She says several chronic sinus infections. She had a right thyroid nodule removed by the Kocher tendon 2007 has been on suppressive Synthroid.  Past Medical History   Diagnosis  Date   .  Hypertension    .  Hyperlipidemia    .  Hypothyroidism    .  Thyroid nodule    .  GERD (gastroesophageal reflux disease)    .  Chest pain      History of normal cardiac catheterization x2; last heart catheter 2000; echocardiogram November 2009: EF 60%; LAE; Myoview 3/12: EF 60%, no ischemia   .  Allergic rhinitis    .  Chronic headache     Past Surgical History   Procedure  Date   .  Bilateral carpal tunnel surgery    .  Replacement total knee bilateral      bilat   .  Nasal sinus surgery    .  Tubal ligation     Current Outpatient Prescriptions   Medication  Sig  Dispense  Refill   .  acetaminophen (TYLENOL) 500 MG tablet  Take 500 mg by mouth every 6 (six) hours as needed. For pain     .  aspirin 81 MG tablet  Take 81 mg by mouth daily.     .  Calcium Carbonate-Vitamin D (CALCIUM 600 + D PO)  Take 1 tablet by mouth daily.     .  Cholecalciferol (VITAMIN D3) 50000 UNITS CAPS  Take by mouth. I take one by mouth on Sunday, Monday, Wednesday, Friday, Saturday     .  diphenhydramine-acetaminophen (TYLENOL PM) 25-500 MG TABS  Take 1 tablet by mouth at bedtime.     .  fish oil-omega-3 fatty acids 1000 MG capsule  Take 1 g by mouth daily.     .  levothyroxine  (SYNTHROID, LEVOTHROID) 100 MCG tablet  Take one half tablet by mouth on Sunday, Tuesday, Wednesday, Friday, Saturday and one tablet on Mondays and Thursdays     .  lisinopril-hydrochlorothiazide (PRINZIDE,ZESTORETIC) 20-25 MG per tablet  Take 1 tablet by mouth daily.     .  potassium gluconate 595 MG TABS  Take 595 mg by mouth daily.     .  pravastatin (PRAVACHOL) 40 MG tablet  Take 40 mg by mouth daily.     .  Topiramate (TOPAMAX PO)  Take 1 tablet by mouth 2 (two) times daily. For migraine prevention     Codeine  Family History   Problem  Relation  Age of Onset   .  Breast cancer  Mother    .  Cancer  Mother       breast cancer    .  Anuerysm  Mother       brain anuerysm    .  Heart attack  Father    .  Heart disease  Father    .  Heart attack  Sister    Social History:   reports that she quit smoking about 32 years ago. Her smoking use included Cigarettes. She has a 8 pack-year smoking history. She does not have any smokeless tobacco history on file. She reports that she does not drink alcohol or use illicit drugs.  REVIEW OF SYSTEMS - PERTINENT POSITIVES ONLY:  See above  Physical Exam:  Blood pressure 150/86, pulse 55, temperature 97.4 F (36.3 C), temperature source Temporal, height 5' 3" (1.6 m), weight 271 lb 9.6 oz (123.197 kg), SpO2 98.00%.  Body mass index is 48.11 kg/(m^2).  Gen: WDWN WF NAD  Neurological: Alert and oriented to person, place, and time. Motor and sensory function is grossly intact  Head: Normocephalic and atraumatic.  Eyes: Conjunctivae are normal. Pupils are equal, round, and reactive to light. No scleral icterus.  Neck: Normal range of motion. Neck supple. No tracheal deviation or thyromegaly present.  Cardiovascular: SR without murmurs or gallops. No carotid bruits  Respiratory: Effort normal. No respiratory distress. No chest wall tenderness. Breath sounds normal. No wheezes, rales or rhonchi.  Abdomen: No abdominal surgery  GU:  Musculoskeletal:  Normal range of motion. Extremities are nontender. No cyanosis, edema or clubbing noted Lymphadenopathy: No cervical, preauricular, postauricular or axillary adenopathy is present Skin: Skin is warm and dry. No rash noted. No diaphoresis. No erythema. No pallor. Pscyh: Normal mood and affect. Behavior is normal. Judgment and thought content normal.  LABORATORY RESULTS:  No results found for this or any previous visit (from the past 48 hour(s)).  RADIOLOGY RESULTS:  No results found.  Problem List:  Patient Active Problem List   Diagnosis   .  HYPERTENSION   .  ABNORMAL HEART RHYTHMS   .  RHINOSINUSITIS, CHRONIC   .  ALLERGIC RHINITIS   .  HEADACHE, CHRONIC   .  Hypertension   .  Hyperlipidemia   .  Hypothyroidism   .  Chest pain   .  GERD (gastroesophageal reflux disease)   .  Dizziness   .  Palpitations   .  DIZZINESS   .  CHEST PAIN UNSPECIFIED   .  Dyspnea   Assessment & Plan:  Morbid obesity. 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  She does have a hiatus hernia and GERD and small gallstones on ultrasound Matt B. Kaylianna Detert, MD, FACS  [facility]  [phone] beeper  [phone]  

## 2012-12-10 NOTE — Progress Notes (Signed)
DR Daphine Deutscher-   Need PRE OP ORDERS Pease- labs drawn per anesthesia at PST visit

## 2012-12-11 ENCOUNTER — Ambulatory Visit (INDEPENDENT_AMBULATORY_CARE_PROVIDER_SITE_OTHER): Payer: Federal, State, Local not specified - PPO | Admitting: Surgery

## 2012-12-11 ENCOUNTER — Inpatient Hospital Stay (HOSPITAL_COMMUNITY): Admission: RE | Admit: 2012-12-11 | Payer: Federal, State, Local not specified - PPO | Source: Ambulatory Visit

## 2012-12-15 MED ORDER — CEFOXITIN SODIUM 2 G IV SOLR
2.0000 g | INTRAVENOUS | Status: AC
  Administered 2012-12-16: 2 g via INTRAVENOUS
  Filled 2012-12-15: qty 2

## 2012-12-15 NOTE — Progress Notes (Signed)
During pt's PST appt no orders were in place.  Labs were done per anesthesia protocol.  Dr. Daphine Deutscher put in labs one day after PST appt.  Text message sent to Dr. Daphine Deutscher regarding the labs ordered and if he would review labs already done.

## 2012-12-15 NOTE — Progress Notes (Signed)
Pt notified of time change to 12:15 pm - instructed to arrive at 9:45 am at Short Stay

## 2012-12-16 ENCOUNTER — Inpatient Hospital Stay (HOSPITAL_COMMUNITY)
Admission: RE | Admit: 2012-12-16 | Discharge: 2012-12-18 | DRG: 288 | Disposition: A | Discharge status: Home / Self Care | Payer: Federal, State, Local not specified - PPO | Source: Ambulatory Visit | Attending: Surgery | Admitting: Surgery

## 2012-12-16 ENCOUNTER — Inpatient Hospital Stay (HOSPITAL_COMMUNITY): Payer: Federal, State, Local not specified - PPO | Admitting: Anesthesiology

## 2012-12-16 ENCOUNTER — Encounter (HOSPITAL_COMMUNITY): Payer: Self-pay | Admitting: *Deleted

## 2012-12-16 ENCOUNTER — Encounter (HOSPITAL_COMMUNITY): Payer: Self-pay | Admitting: Anesthesiology

## 2012-12-16 ENCOUNTER — Encounter (HOSPITAL_COMMUNITY)
Admission: RE | Disposition: A | Discharge status: Home / Self Care | Payer: Self-pay | Source: Ambulatory Visit | Attending: Surgery

## 2012-12-16 DIAGNOSIS — K8020 Calculus of gallbladder without cholecystitis without obstruction: Secondary | ICD-10-CM | POA: Diagnosis present

## 2012-12-16 DIAGNOSIS — Z87891 Personal history of nicotine dependence: Secondary | ICD-10-CM

## 2012-12-16 DIAGNOSIS — Z6841 Body Mass Index (BMI) 40.0 and over, adult: Secondary | ICD-10-CM

## 2012-12-16 DIAGNOSIS — E785 Hyperlipidemia, unspecified: Secondary | ICD-10-CM

## 2012-12-16 DIAGNOSIS — E669 Obesity, unspecified: Secondary | ICD-10-CM

## 2012-12-16 DIAGNOSIS — I10 Essential (primary) hypertension: Secondary | ICD-10-CM

## 2012-12-16 DIAGNOSIS — K449 Diaphragmatic hernia without obstruction or gangrene: Secondary | ICD-10-CM | POA: Diagnosis present

## 2012-12-16 DIAGNOSIS — Z79899 Other long term (current) drug therapy: Secondary | ICD-10-CM

## 2012-12-16 DIAGNOSIS — Z8249 Family history of ischemic heart disease and other diseases of the circulatory system: Secondary | ICD-10-CM

## 2012-12-16 DIAGNOSIS — K210 Gastro-esophageal reflux disease with esophagitis: Secondary | ICD-10-CM

## 2012-12-16 DIAGNOSIS — E039 Hypothyroidism, unspecified: Secondary | ICD-10-CM | POA: Diagnosis present

## 2012-12-16 DIAGNOSIS — J309 Allergic rhinitis, unspecified: Secondary | ICD-10-CM | POA: Diagnosis present

## 2012-12-16 DIAGNOSIS — R0600 Dyspnea, unspecified: Secondary | ICD-10-CM

## 2012-12-16 DIAGNOSIS — Z7982 Long term (current) use of aspirin: Secondary | ICD-10-CM

## 2012-12-16 DIAGNOSIS — Z9884 Bariatric surgery status: Secondary | ICD-10-CM

## 2012-12-16 DIAGNOSIS — K219 Gastro-esophageal reflux disease without esophagitis: Secondary | ICD-10-CM | POA: Diagnosis present

## 2012-12-16 DIAGNOSIS — Z96659 Presence of unspecified artificial knee joint: Secondary | ICD-10-CM

## 2012-12-16 HISTORY — PX: GASTRIC ROUX-EN-Y: SHX5262

## 2012-12-16 LAB — CREATININE, SERUM
Creatinine, Ser: 1.08 mg/dL (ref 0.50–1.10)
GFR calc Af Amer: 63 mL/min — ABNORMAL LOW (ref >90)
GFR calc non Af Amer: 55 mL/min — ABNORMAL LOW (ref >90)

## 2012-12-16 LAB — CBC
HCT: 36 % (ref 36.0–46.0)
Hemoglobin: 12.3 g/dL (ref 12.0–15.0)
WBC: 12.9 10*3/uL — ABNORMAL HIGH (ref 4.0–10.5)

## 2012-12-16 SURGERY — LAPAROSCOPIC ROUX-EN-Y GASTRIC BYPASS WITH UPPER ENDOSCOPY
Anesthesia: General | Site: Abdomen | Wound class: Clean Contaminated

## 2012-12-16 MED ORDER — MORPHINE SULFATE 2 MG/ML IJ SOLN
2.0000 mg | INTRAMUSCULAR | Status: DC | PRN
  Administered 2012-12-16 – 2012-12-18 (×9): 2 mg via INTRAVENOUS
  Filled 2012-12-16 (×9): qty 1

## 2012-12-16 MED ORDER — ROCURONIUM BROMIDE 100 MG/10ML IV SOLN
INTRAVENOUS | Status: DC | PRN
  Administered 2012-12-16 (×2): 10 mg via INTRAVENOUS
  Administered 2012-12-16: 20 mg via INTRAVENOUS
  Administered 2012-12-16: 50 mg via INTRAVENOUS
  Administered 2012-12-16: 10 mg via INTRAVENOUS

## 2012-12-16 MED ORDER — ACETAMINOPHEN 160 MG/5ML PO SOLN: 650.0000 mg | ORAL | Status: DC | PRN

## 2012-12-16 MED ORDER — LACTATED RINGERS IR SOLN
Status: DC | PRN
  Administered 2012-12-16: 3000 mL

## 2012-12-16 MED ORDER — LACTATED RINGERS IV SOLN
INTRAVENOUS | Status: DC | PRN
  Administered 2012-12-16 (×2): via INTRAVENOUS
  Administered 2012-12-16: 17:00:00

## 2012-12-16 MED ORDER — ONDANSETRON HCL 4 MG/2ML IJ SOLN
INTRAMUSCULAR | Status: DC | PRN
  Administered 2012-12-16: 4 mg via INTRAVENOUS

## 2012-12-16 MED ORDER — EPHEDRINE SULFATE 50 MG/ML IJ SOLN
INTRAMUSCULAR | Status: DC | PRN
  Administered 2012-12-16 (×4): 5 mg via INTRAVENOUS

## 2012-12-16 MED ORDER — KCL IN DEXTROSE-NACL 20-5-0.45 MEQ/L-%-% IV SOLN
INTRAVENOUS | Status: DC
  Administered 2012-12-16 – 2012-12-17 (×3): via INTRAVENOUS
  Administered 2012-12-18: 100 mL/h via INTRAVENOUS
  Administered 2012-12-18: 01:00:00 via INTRAVENOUS
  Filled 2012-12-16 (×6): qty 1000

## 2012-12-16 MED ORDER — OXYCODONE-ACETAMINOPHEN 5-325 MG/5ML PO SOLN
5.0000 mL | ORAL | Status: DC | PRN
  Administered 2012-12-18: 5 mL via ORAL
  Filled 2012-12-16: qty 5

## 2012-12-16 MED ORDER — PHENYLEPHRINE HCL 10 MG/ML IJ SOLN
INTRAMUSCULAR | Status: DC | PRN
  Administered 2012-12-16: 80 ug via INTRAVENOUS
  Administered 2012-12-16: 40 ug via INTRAVENOUS
  Administered 2012-12-16: 80 ug via INTRAVENOUS

## 2012-12-16 MED ORDER — DEXAMETHASONE SODIUM PHOSPHATE 10 MG/ML IJ SOLN
INTRAMUSCULAR | Status: DC | PRN
  Administered 2012-12-16: 10 mg via INTRAVENOUS

## 2012-12-16 MED ORDER — GLYCOPYRROLATE 0.2 MG/ML IJ SOLN
INTRAMUSCULAR | Status: DC | PRN
  Administered 2012-12-16: .6 mg via INTRAVENOUS

## 2012-12-16 MED ORDER — UNJURY CHOCOLATE CLASSIC POWDER
2.0000 [oz_av] | Freq: Four times a day (QID) | ORAL | Status: DC
  Administered 2012-12-18 (×2): 2 [oz_av] via ORAL
  Filled 2012-12-16 (×4): qty 27

## 2012-12-16 MED ORDER — ONDANSETRON HCL 4 MG/2ML IJ SOLN
4.0000 mg | INTRAMUSCULAR | Status: DC | PRN
  Administered 2012-12-16 – 2012-12-17 (×2): 4 mg via INTRAVENOUS
  Filled 2012-12-16 (×2): qty 2

## 2012-12-16 MED ORDER — FENTANYL CITRATE 0.05 MG/ML IJ SOLN
INTRAMUSCULAR | Status: DC | PRN
  Administered 2012-12-16: 50 ug via INTRAVENOUS
  Administered 2012-12-16: 100 ug via INTRAVENOUS
  Administered 2012-12-16 (×4): 50 ug via INTRAVENOUS

## 2012-12-16 MED ORDER — SCOPOLAMINE 1 MG/3DAYS TD PT72
MEDICATED_PATCH | TRANSDERMAL | Status: DC | PRN
  Administered 2012-12-16: 1 via TRANSDERMAL

## 2012-12-16 MED ORDER — HEPARIN SODIUM (PORCINE) 5000 UNIT/ML IJ SOLN
5000.0000 [IU] | Freq: Three times a day (TID) | INTRAMUSCULAR | Status: DC
  Administered 2012-12-17 – 2012-12-18 (×5): 5000 [IU] via SUBCUTANEOUS
  Filled 2012-12-16 (×7): qty 1

## 2012-12-16 MED ORDER — BUPIVACAINE LIPOSOME 1.3 % IJ SUSP
20.0000 mL | Freq: Once | INTRAMUSCULAR | Status: DC
  Filled 2012-12-16: qty 20

## 2012-12-16 MED ORDER — UNJURY VANILLA POWDER
2.0000 [oz_av] | Freq: Four times a day (QID) | ORAL | Status: DC
  Filled 2012-12-16 (×4): qty 27

## 2012-12-16 MED ORDER — LIDOCAINE HCL (CARDIAC) 20 MG/ML IV SOLN
INTRAVENOUS | Status: DC | PRN
  Administered 2012-12-16: 80 mg via INTRAVENOUS

## 2012-12-16 MED ORDER — TISSEEL VH 10 ML EX KIT
PACK | CUTANEOUS | Status: DC | PRN
  Administered 2012-12-16: 10 mL

## 2012-12-16 MED ORDER — 0.9 % SODIUM CHLORIDE (POUR BTL) OPTIME
TOPICAL | Status: DC | PRN
  Administered 2012-12-16: 1000 mL

## 2012-12-16 MED ORDER — NEOSTIGMINE METHYLSULFATE 1 MG/ML IJ SOLN
INTRAMUSCULAR | Status: DC | PRN
  Administered 2012-12-16: 5 mg via INTRAVENOUS

## 2012-12-16 MED ORDER — METOCLOPRAMIDE HCL 5 MG/ML IJ SOLN
10.0000 mg | Freq: Once | INTRAMUSCULAR | Status: AC | PRN
  Administered 2012-12-16: 10 mg via INTRAVENOUS

## 2012-12-16 MED ORDER — UNJURY CHICKEN SOUP POWDER
2.0000 [oz_av] | Freq: Four times a day (QID) | ORAL | Status: DC
  Filled 2012-12-16 (×4): qty 27

## 2012-12-16 MED ORDER — HEPARIN SODIUM (PORCINE) 5000 UNIT/ML IJ SOLN
5000.0000 [IU] | INTRAMUSCULAR | Status: AC
  Administered 2012-12-16: 5000 [IU] via SUBCUTANEOUS
  Filled 2012-12-16: qty 1

## 2012-12-16 MED ORDER — ACETAMINOPHEN 10 MG/ML IV SOLN
INTRAVENOUS | Status: DC | PRN
  Administered 2012-12-16: 1000 mg via INTRAVENOUS

## 2012-12-16 MED ORDER — PROPOFOL 10 MG/ML IV BOLUS
INTRAVENOUS | Status: DC | PRN
  Administered 2012-12-16: 200 mg via INTRAVENOUS

## 2012-12-16 MED ORDER — BUPIVACAINE LIPOSOME 1.3 % IJ SUSP
INTRAMUSCULAR | Status: DC | PRN
  Administered 2012-12-16: 20 mL

## 2012-12-16 MED ORDER — HYDROMORPHONE HCL PF 1 MG/ML IJ SOLN
0.2500 mg | INTRAMUSCULAR | Status: DC | PRN
  Administered 2012-12-16 (×4): 0.5 mg via INTRAVENOUS

## 2012-12-16 MED ORDER — MIDAZOLAM HCL 5 MG/5ML IJ SOLN
INTRAMUSCULAR | Status: DC | PRN
  Administered 2012-12-16: 2 mg via INTRAVENOUS

## 2012-12-16 SURGICAL SUPPLY — 73 items
APPLICATOR COTTON TIP 6IN STRL (MISCELLANEOUS) ×4 IMPLANT
BENZOIN TINCTURE PRP APPL 2/3 (GAUZE/BANDAGES/DRESSINGS) IMPLANT
BLADE SURG 15 STRL LF DISP TIS (BLADE) ×1 IMPLANT
BLADE SURG 15 STRL SS (BLADE) ×1
CABLE HIGH FREQUENCY MONO STRZ (ELECTRODE) ×2 IMPLANT
CANISTER SUCTION 2500CC (MISCELLANEOUS) ×4 IMPLANT
CLIP SUT LAPRA TY ABSORB (SUTURE) ×4 IMPLANT
CLOTH BEACON ORANGE TIMEOUT ST (SAFETY) ×2 IMPLANT
COVER SURGICAL LIGHT HANDLE (MISCELLANEOUS) ×2 IMPLANT
DERMABOND ADVANCED (GAUZE/BANDAGES/DRESSINGS) ×1
DERMABOND ADVANCED .7 DNX12 (GAUZE/BANDAGES/DRESSINGS) ×1 IMPLANT
DEVICE SUT QUICK LOAD TK 5 (STAPLE) ×4 IMPLANT
DEVICE SUT TI-KNOT TK 5X26 (MISCELLANEOUS) ×2 IMPLANT
DEVICE SUTURE ENDOST 10MM (ENDOMECHANICALS) ×2 IMPLANT
DISSECTOR BLUNT TIP ENDO 5MM (MISCELLANEOUS) ×2 IMPLANT
DRAIN PENROSE 18X1/4 LTX STRL (WOUND CARE) ×2 IMPLANT
DRAPE CAMERA CLOSED 9X96 (DRAPES) ×2 IMPLANT
GAUZE SPONGE 4X4 16PLY XRAY LF (GAUZE/BANDAGES/DRESSINGS) ×2 IMPLANT
GLOVE BIOGEL M 8.0 STRL (GLOVE) ×2 IMPLANT
GLOVE SURG SIGNA 7.5 PF LTX (GLOVE) ×2 IMPLANT
GLOVE SURG SS PI 6.5 STRL IVOR (GLOVE) ×4 IMPLANT
GLOVE SURG SS PI 8.5 STRL IVOR (GLOVE) ×1
GLOVE SURG SS PI 8.5 STRL STRW (GLOVE) ×1 IMPLANT
GOWN BRE IMP PREV XXLGXLNG (GOWN DISPOSABLE) ×2 IMPLANT
GOWN STRL NON-REIN LRG LVL3 (GOWN DISPOSABLE) ×4 IMPLANT
GOWN STRL REIN XL XLG (GOWN DISPOSABLE) ×4 IMPLANT
HANDLE STAPLE EGIA 4 XL (STAPLE) ×2 IMPLANT
HOVERMATT SINGLE USE (MISCELLANEOUS) ×2 IMPLANT
IV LACTATED RINGER IRRG 3000ML (IV SOLUTION) ×1
IV LR IRRIG 3000ML ARTHROMATIC (IV SOLUTION) ×1 IMPLANT
KIT BASIN OR (CUSTOM PROCEDURE TRAY) ×2 IMPLANT
KIT GASTRIC LAVAGE 34FR ADT (SET/KITS/TRAYS/PACK) ×2 IMPLANT
MARKER SKIN DUAL TIP RULER LAB (MISCELLANEOUS) ×2 IMPLANT
NEEDLE SPNL 22GX3.5 QUINCKE BK (NEEDLE) ×2 IMPLANT
NS IRRIG 1000ML POUR BTL (IV SOLUTION) ×2 IMPLANT
PACK CARDIOVASCULAR III (CUSTOM PROCEDURE TRAY) ×2 IMPLANT
RELOAD EGIA 45 MED/THCK PURPLE (STAPLE) ×2 IMPLANT
RELOAD EGIA 45 TAN VASC (STAPLE) ×2 IMPLANT
RELOAD EGIA 60 MED/THCK PURPLE (STAPLE) ×8 IMPLANT
RELOAD EGIA 60 TAN VASC (STAPLE) ×2 IMPLANT
RELOAD ENDO STITCH (ENDOMECHANICALS) ×4 IMPLANT
RELOAD ENDO STITCH 2.0 (ENDOMECHANICALS) ×9
SCISSORS LAP 5X35 DISP (ENDOMECHANICALS) ×2 IMPLANT
SCISSORS LAP 5X45 EPIX DISP (ENDOMECHANICALS) ×2 IMPLANT
SEALANT SURGICAL APPL DUAL CAN (MISCELLANEOUS) ×2 IMPLANT
SET IRRIG TUBING LAPAROSCOPIC (IRRIGATION / IRRIGATOR) ×2 IMPLANT
SHEARS CURVED HARMONIC AC 45CM (MISCELLANEOUS) ×2 IMPLANT
SLEEVE ADV FIXATION 12X100MM (TROCAR) ×4 IMPLANT
SLEEVE ADV FIXATION 5X100MM (TROCAR) ×4 IMPLANT
SLEEVE Z-THREAD 5X100MM (TROCAR) IMPLANT
SOLUTION ANTI FOG 6CC (MISCELLANEOUS) ×2 IMPLANT
SPONGE GAUZE 4X4 12PLY (GAUZE/BANDAGES/DRESSINGS) IMPLANT
STAPLER VISISTAT 35W (STAPLE) IMPLANT
STRIP CLOSURE SKIN 1/2X4 (GAUZE/BANDAGES/DRESSINGS) IMPLANT
STRIP PERI DRY VERITAS 45 (STAPLE) IMPLANT
STRIP PERI DRY VERITAS 60 (STAPLE) ×4 IMPLANT
SUT RELOAD ENDO STITCH 2 48X1 (ENDOMECHANICALS) ×5
SUT RELOAD ENDO STITCH 2.0 (ENDOMECHANICALS) ×4
SUT VIC AB 2-0 SH 27 (SUTURE) ×1
SUT VIC AB 2-0 SH 27X BRD (SUTURE) ×1 IMPLANT
SUT VIC AB 4-0 SH 18 (SUTURE) ×2 IMPLANT
SUTURE RELOAD END STTCH 2 48X1 (ENDOMECHANICALS) ×5 IMPLANT
SUTURE RELOAD ENDO STITCH 2.0 (ENDOMECHANICALS) ×4 IMPLANT
SYR 20CC LL (SYRINGE) ×2 IMPLANT
SYR 30ML LL (SYRINGE) ×2 IMPLANT
SYR 50ML LL SCALE MARK (SYRINGE) ×2 IMPLANT
TRAY FOLEY CATH 14FRSI W/METER (CATHETERS) ×2 IMPLANT
TROCAR ADV FIXATION 12X100MM (TROCAR) ×2 IMPLANT
TROCAR XCEL 12X100 BLDLESS (ENDOMECHANICALS) ×2 IMPLANT
TROCAR Z-THREAD FIOS 5X100MM (TROCAR) ×2 IMPLANT
TUBING CONNECTING 10 (TUBING) ×2 IMPLANT
TUBING ENDO SMARTCAP (MISCELLANEOUS) ×2 IMPLANT
TUBING FILTER THERMOFLATOR (ELECTROSURGICAL) ×2 IMPLANT

## 2012-12-16 NOTE — Preoperative (Signed)
Beta Blockers   Reason not to administer Beta Blockers:Not Applicable 

## 2012-12-16 NOTE — Anesthesia Postprocedure Evaluation (Signed)
Anesthesia Post Note  Patient: Tracy Paul  Procedure(s) Performed: Procedure(s) (LRB): LAPAROSCOPIC ROUX-EN-Y GASTRIC BYPASS WITH UPPER ENDOSCOPY (N/A)  Anesthesia type: General  Patient location: PACU  Post pain: Pain level controlled  Post assessment: Post-op Vital signs reviewed  Last Vitals: BP 137/71  Pulse 69  Temp(Src) 36.9 C (Oral)  Resp 17  Ht 5\' 4"  (1.626 m)  Wt 260 lb 2 oz (117.992 kg)  BMI 44.63 kg/m2  SpO2 100%  Post vital signs: Reviewed  Level of consciousness: sedated  Complications: No apparent anesthesia complications

## 2012-12-16 NOTE — Op Note (Signed)
NAMEJEMEKA, WAGLER NO.:  [id number]  MEDICAL RECORD NO.:  [id number]  LOCATION:  1233                         FACILITY:  [facility]  PHYSICIAN:  Sandria Bales. Ezzard Standing, M.D.  DATE OF BIRTH:  [birth year]  DATE OF PROCEDURE:  12/16/2012                              OPERATIVE REPORT  PREOPERATIVE DIAGNOSIS:  Morbid obesity, status post Roux-en-Y gastric bypass.  POSTOPERATIVE DIAGNOSIS:  Morbid obesity, status post Roux-en-Y gastric bypass.  PROCEDURE:  Upper esophagogastroscopy (intraoperative).  SURGEON:  Sandria Bales. Ezzard Standing, M.D.  ANESTHESIA:  General endotracheal.  INDICATION FOR PROCEDURE:  Ms. Hilmes is a 62 year old white female who is a patient Dr. Wenda Low who has undergone a Roux-en-Y gastric bypass today.  I am doing intraoperative endoscopy to evaluate the gastric pouch and gastrojejunal anastomosis.  OPERATIVE NOTE:  The patient was under general anesthesia in room #1 at [facility].  Dr. Daphine Deutscher has completed the laparoscopic Roux-en- Y gastric bypass, and I have taken Olympus flexible endoscope to pass this down the back of the throat.  I identified the GE junction at about 36-37 cm.  The gastric pouch itself looked normal with no bleeding and viable mucosa.   The anastomosis was about 41-42 cm for approximately 5 cm pouch and was widely patent.  Photos were taken of the pouch and of the anastomosis.  I insufflated the abdomen, stomach with air.  Dr. Daphine Deutscher clamped off the limb of the jejunum.  He flooded the upper abdomen with saline.  Put the gastric pouch and gastrojejunal anastomosis under saline.  There were no bubbles and no evidence of leak.  The scope was then withdrawn and the esophagus was unremarkable.  The patient tolerated procedure well.  Dr. Daphine Deutscher will dictate the primary Roux-en-Y gastric bypass.   Sandria Bales. Ezzard Standing, M.D., FACS  DHN/MEDQ  D:  12/16/2012  T:  12/16/2012  Job:  [id number]

## 2012-12-16 NOTE — H&P (View-Only) (Signed)
Chief Complaint: Morbid obesity BMI 48  History of Present Illness: Tracy Paul is an 62 y.o. female of Dr. Marlowe Shores. She comes in today for initial bariatric encounter with a weight of 270 1 PM a 48. 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 She has never had any abdominal operations and has had bilateral knees and bilateral carpal tunnel procedures done. She has had hypertension since 1995. She said dyslipidemia since 2005. She's had GERD for which he intermittently treats with Citracal. She says several chronic sinus infections. She had a right thyroid nodule removed by the Kocher tendon 2007 has been on suppressive Synthroid.  Past Medical History   Diagnosis  Date   .  Hypertension    .  Hyperlipidemia    .  Hypothyroidism    .  Thyroid nodule    .  GERD (gastroesophageal reflux disease)    .  Chest pain      History of normal cardiac catheterization x2; last heart catheter 2000; echocardiogram November 2009: EF 60%; LAE; Myoview 3/12: EF 60%, no ischemia   .  Allergic rhinitis    .  Chronic headache     Past Surgical History   Procedure  Date   .  Bilateral carpal tunnel surgery    .  Replacement total knee bilateral      bilat   .  Nasal sinus surgery    .  Tubal ligation     Current Outpatient Prescriptions   Medication  Sig  Dispense  Refill   .  acetaminophen (TYLENOL) 500 MG tablet  Take 500 mg by mouth every 6 (six) hours as needed. For pain     .  aspirin 81 MG tablet  Take 81 mg by mouth daily.     .  Calcium Carbonate-Vitamin D (CALCIUM 600 + D PO)  Take 1 tablet by mouth daily.     .  Cholecalciferol (VITAMIN D3) 50000 UNITS CAPS  Take by mouth. I take one by mouth on Sunday, Monday, Wednesday, Friday, Saturday     .  diphenhydramine-acetaminophen (TYLENOL PM) 25-500 MG TABS  Take 1 tablet by mouth at bedtime.     .  fish oil-omega-3 fatty acids 1000 MG capsule  Take 1 g by mouth daily.     Marland Kitchen  levothyroxine  (SYNTHROID, LEVOTHROID) 100 MCG tablet  Take one half tablet by mouth on Sunday, Tuesday, Wednesday, Friday, Saturday and one tablet on Mondays and Thursdays     .  lisinopril-hydrochlorothiazide (PRINZIDE,ZESTORETIC) 20-25 MG per tablet  Take 1 tablet by mouth daily.     .  potassium gluconate 595 MG TABS  Take 595 mg by mouth daily.     .  pravastatin (PRAVACHOL) 40 MG tablet  Take 40 mg by mouth daily.     .  Topiramate (TOPAMAX PO)  Take 1 tablet by mouth 2 (two) times daily. For migraine prevention     Codeine  Family History   Problem  Relation  Age of Onset   .  Breast cancer  Mother    .  Cancer  Mother       breast cancer    .  Anuerysm  Mother       brain anuerysm    .  Heart attack  Father    .  Heart disease  Father    .  Heart attack  Sister    Social History:  reports that she quit smoking about 32 years ago. Her smoking use included Cigarettes. She has a 8 pack-year smoking history. She does not have any smokeless tobacco history on file. She reports that she does not drink alcohol or use illicit drugs.  REVIEW OF SYSTEMS - PERTINENT POSITIVES ONLY:  See above  Physical Exam:  Blood pressure 150/86, pulse 55, temperature 97.4 F (36.3 C), temperature source Temporal, height 5\' 3"  (1.6 m), weight 271 lb 9.6 oz (123.197 kg), SpO2 98.00%.  Body mass index is 48.11 kg/(m^2).  Gen: WDWN WF NAD  Neurological: Alert and oriented to person, place, and time. Motor and sensory function is grossly intact  Head: Normocephalic and atraumatic.  Eyes: Conjunctivae are normal. Pupils are equal, round, and reactive to light. No scleral icterus.  Neck: Normal range of motion. Neck supple. No tracheal deviation or thyromegaly present.  Cardiovascular: SR without murmurs or gallops. No carotid bruits  Respiratory: Effort normal. No respiratory distress. No chest wall tenderness. Breath sounds normal. No wheezes, rales or rhonchi.  Abdomen: No abdominal surgery  GU:  Musculoskeletal:  Normal range of motion. Extremities are nontender. No cyanosis, edema or clubbing noted Lymphadenopathy: No cervical, preauricular, postauricular or axillary adenopathy is present Skin: Skin is warm and dry. No rash noted. No diaphoresis. No erythema. No pallor. Pscyh: Normal mood and affect. Behavior is normal. Judgment and thought content normal.  LABORATORY RESULTS:  No results found for this or any previous visit (from the past 48 hour(s)).  RADIOLOGY RESULTS:  No results found.  Problem List:  Patient Active Problem List   Diagnosis   .  HYPERTENSION   .  ABNORMAL HEART RHYTHMS   .  RHINOSINUSITIS, CHRONIC   .  ALLERGIC RHINITIS   .  HEADACHE, CHRONIC   .  Hypertension   .  Hyperlipidemia   .  Hypothyroidism   .  Chest pain   .  GERD (gastroesophageal reflux disease)   .  Dizziness   .  Palpitations   .  DIZZINESS   .  CHEST PAIN UNSPECIFIED   .  Dyspnea   Assessment & Plan:  Morbid obesity. 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  She does have a hiatus hernia and GERD and small gallstones on ultrasound Matt B. Daphine Deutscher, MD, [facility]  [phone] beeper  [phone]

## 2012-12-16 NOTE — Anesthesia Preprocedure Evaluation (Addendum)
Anesthesia Evaluation  Patient identified by MRN, date of birth, ID band Patient awake    Reviewed: Allergy & Precautions, H&P , NPO status , Patient's Chart, lab work & pertinent test results, reviewed documented beta blocker date and time   History of Anesthesia Complications (+) PONV and MALIGNANT HYPERTHERMIA  Airway Mallampati: III TM Distance: >3 FB Neck ROM: full    Dental   Pulmonary neg pulmonary ROS, shortness of breath and with exertion,  breath sounds clear to auscultation        Cardiovascular hypertension, On Medications + dysrhythmias Rhythm:regular     Neuro/Psych  Headaches, negative psych ROS   GI/Hepatic Neg liver ROS, GERD-  Medicated and Controlled,  Endo/Other  Hypothyroidism   Renal/GU negative Renal ROS  negative genitourinary   Musculoskeletal   Abdominal   Peds  Hematology negative hematology ROS (+)   Anesthesia Other Findings See surgeon's H&P   Reproductive/Obstetrics negative OB ROS                          Anesthesia Physical Anesthesia Plan  ASA: III  Anesthesia Plan: General   Post-op Pain Management:    Induction: Intravenous  Airway Management Planned: Oral ETT  Additional Equipment:   Intra-op Plan:   Post-operative Plan: Extubation in OR  Informed Consent: I have reviewed the patients History and Physical, chart, labs and discussed the procedure including the risks, benefits and alternatives for the proposed anesthesia with the patient or authorized representative who has indicated his/her understanding and acceptance.   Dental Advisory Given  Plan Discussed with: CRNA and Surgeon  Anesthesia Plan Comments:         Anesthesia Quick Evaluation

## 2012-12-16 NOTE — Interval H&P Note (Signed)
History and Physical Interval Note:  12/16/2012 12:21 PM  Tracy Paul  has presented today for surgery, with the diagnosis of morbid obesity  The various methods of treatment have been discussed with the patient and family. After consideration of risks, benefits and other options for treatment, the patient has consented to  Procedure(s) with comments: LAPAROSCOPIC ROUX-EN-Y GASTRIC BYPASS WITH UPPER ENDOSCOPY (N/A) - Gastric Bypass as a surgical intervention .  The patient's history has been reviewed, patient examined, no change in status, stable for surgery.  I have reviewed the patient's chart and labs.  Questions were answered to the patient's satisfaction.     Kennede Lusk B

## 2012-12-16 NOTE — Op Note (Signed)
Surgeon: Pollyann Savoy. Daphine Deutscher, MD, FACS Asst:  Ovidio Kin, MD, FACS Anesthesia: General endotracheal Drains: None  Procedure: Laparoscopic Roux en Y gastric bypass with 40 cm BP limb and 100 cm Roux limb, antecolic, antegastric, candy cane to the left.  Closure of Peterson's defect. Upper endoscopy.   Description of Procedure:  The patient was taken to OR 1 at [facility] and given general anesthesia.  The abdomen was prepped with PCMX and draped sterilely.  A time out was performed.    The operation began by identifying the ligament of Treitz. I measured 40 cm downstream and divided the bowel with a 6 cm Covidian stapler.  I sutured a Penrose drain along the Roux limb end.  I measured a 1 meter (100 cm) Roux limb and then placed the distal bowels to the BP limb side by side and performed a stapled jejunojejunostomy. The common defect was closed from either end with 4-0 Vicryl using the Endo Stitch. The mesenteric defect was closed with a running 2-0 silk using the Endo Stitch. Tisseel was applied to the suture line.  The omentum was divided with the harmonic scalpel.  The Nathanson retractor was inserted in the left lateral segment of liver was retracted. The foregut dissection ensued.  A dimple was seen and a retroesophageal dissection was performed and a single suture was placed.  This was subsequently repositioned to allow the Ewall tube to be inserted.  5 cm down the lessor curvature the dissection revealed the retrogastric space.  A small pouch was created using multiple applications of the Covidien purple load 6 cm stapler applying first 2 without Peri-Strips and then to applications with Peri-Strips to complete the pouch.  The Roux limb was then brought up with the candycane pointed left and a back row of sutures of 2-0 Vicryl were placed. I opened along the right side of each structure and inserted the 4.5 cm stapler to create the gastrojejunostomy. The common defect was closed from either end with 2-0  Vicryl and a second row was placed anterior to that the Ewald tube acting as a stent across the anastomosis. The Penrose drain was removed. Peterson's defect was closed with 2-0 silk.   Endoscopy was performed by Dr. Ezzard Standing which revealed about a 5 cm pouch and no evidence of bleeding or leak bubbles were seen the outside.  The incisions were injected with Exparel and were closed with 4-0 Vicryl and Dermabond  The patient was taken to the recovery room in satisfactory condition.  Matt B. Daphine Deutscher, MD, FACS

## 2012-12-16 NOTE — Transfer of Care (Signed)
Immediate Anesthesia Transfer of Care Note  Patient: Tracy Paul  Procedure(s) Performed: Procedure(s) with comments: LAPAROSCOPIC ROUX-EN-Y GASTRIC BYPASS WITH UPPER ENDOSCOPY (N/A) - Gastric Bypass  Patient Location: PACU  Anesthesia Type:General  Level of Consciousness: awake, sedated and patient cooperative  Airway & Oxygen Therapy: Patient Spontanous Breathing and Patient connected to face mask oxygen  Post-op Assessment: Report given to PACU RN and Post -op Vital signs reviewed and stable  Post vital signs: Reviewed and stable  Complications: No apparent anesthesia complications

## 2012-12-17 ENCOUNTER — Encounter (HOSPITAL_COMMUNITY): Payer: Self-pay | Admitting: Surgery

## 2012-12-17 ENCOUNTER — Inpatient Hospital Stay (HOSPITAL_COMMUNITY): Payer: Federal, State, Local not specified - PPO

## 2012-12-17 DIAGNOSIS — Z9889 Other specified postprocedural states: Secondary | ICD-10-CM

## 2012-12-17 LAB — COMPREHENSIVE METABOLIC PANEL
AST: 51 U/L — ABNORMAL HIGH (ref 0–37)
Albumin: 3.1 g/dL — ABNORMAL LOW (ref 3.5–5.2)
Alkaline Phosphatase: 60 U/L (ref 39–117)
Chloride: 99 mEq/L (ref 96–112)
Creatinine, Ser: 0.97 mg/dL (ref 0.50–1.10)
Potassium: 4.6 mEq/L (ref 3.5–5.1)
Total Bilirubin: 0.7 mg/dL (ref 0.3–1.2)
Total Protein: 6.6 g/dL (ref 6.0–8.3)

## 2012-12-17 LAB — CBC WITH DIFFERENTIAL/PLATELET
Basophils Absolute: 0 10*3/uL (ref 0.0–0.1)
Lymphocytes Relative: 7 % — ABNORMAL LOW (ref 12–46)
Neutro Abs: 10.6 10*3/uL — ABNORMAL HIGH (ref 1.7–7.7)
Neutrophils Relative %: 91 % — ABNORMAL HIGH (ref 43–77)
Platelets: 275 10*3/uL (ref 150–400)
RDW: 13.3 % (ref 11.5–15.5)
WBC: 11.7 10*3/uL — ABNORMAL HIGH (ref 4.0–10.5)

## 2012-12-17 NOTE — Progress Notes (Signed)
CARE MANAGEMENT NOTE 12/17/2012  Patient:  Tracy Paul, Tracy Paul   Account Number:  [id number]  Date Initiated:  12/17/2012  Documentation initiated by:  DAVIS,RHONDA  Subjective/Objective Assessment:   gastric bypass performed on morbidly obese patient.     Action/Plan:   home   Anticipated DC Date:  12/20/2012   Anticipated DC Plan:  HOME/SELF CARE  In-house referral  NA      DC Planning Services  NA      PAC Choice  NA   Choice offered to / List presented to:  NA   DME arranged  NA      DME agency  NA     HH arranged  NA      HH agency  NA   Status of service:  In process, will continue to follow Medicare Important Message given?  NA - LOS <3 / Initial given by admissions (If response is "NO", the following Medicare IM given date fields will be blank) Date Medicare IM given:   Date Additional Medicare IM given:    Discharge Disposition:    Per UR Regulation:  Reviewed for med. necessity/level of care/duration of stay  If discussed at Long Length of Stay Meetings, dates discussed:    Comments:  [id number]/Rhonda Earlene Plater, RN, BSN, CCM:  CHART REVIEWED AND UPDATED.  Next chart review due on [id number]. NO DISCHARGE NEEDS PRESENT AT THIS TIME. CASE MANAGEMENT [phone]

## 2012-12-17 NOTE — Progress Notes (Signed)
Bilateral:  No evidence of DVT, superficial thrombosis, or Baker's Cyst.   

## 2012-12-17 NOTE — Progress Notes (Signed)
Patient is alert and oriented.  VSS.  Patient monitored overnight in ICU.  Patient is up ambulating in hallway with spouse.  Patient is using incentive spirometry and wearing compression hose while in bed.  Doppler studies and UGI results negative.  Dr.Martin notified, will advance to postop day #1 diet.  Discharge instructions given to patient and spouse to review, will go over in detail prior to discharge.    Quenton Fetter, RN

## 2012-12-17 NOTE — Progress Notes (Signed)
Patient ID: Tracy Paul, female   DOB: [birth year], 62 y.o.   MRN: [id number] [facility] Progress Note:   1 Day Post-Op  Subjective: Mental status is clear Objective: Vital signs in last 24 hours: Temp:  [97.6 F (36.4 C)-98.4 F (36.9 C)] 98.2 F (36.8 C) (02/19 0800) Pulse Rate:  [64-95] 64 (02/19 0400) Resp:  [12-27] 19 (02/19 0400) BP: (105-141)/(44-76) 113/52 mmHg (02/19 0827) SpO2:  [92 %-100 %] 95 % (02/19 0400)  Intake/Output from previous day: 02/18 0701 - 02/19 0700 In: 4285 [I.V.:4285] Out: 690 [Urine:640; Blood:50] Intake/Output this shift:    Physical Exam: Work of breathing is normal.  Incisions minimally painful  Lab Results:  Results for orders placed during the hospital encounter of 12/16/12 (from the past 48 hour(s))  CBC     Status: Abnormal   Collection Time    12/16/12  8:21 PM      Result Value Range   WBC 12.9 (*) 4.0 - 10.5 K/uL   RBC 4.07  3.87 - 5.11 MIL/uL   Hemoglobin 12.3  12.0 - 15.0 g/dL   HCT 40.9  81.1 - 91.4 %   MCV 88.5  78.0 - 100.0 fL   MCH 30.2  26.0 - 34.0 pg   MCHC 34.2  30.0 - 36.0 g/dL   RDW 78.2  95.6 - 21.3 %   Platelets 289  150 - 400 K/uL  CREATININE, SERUM     Status: Abnormal   Collection Time    12/16/12  8:21 PM      Result Value Range   Creatinine, Ser 1.08  0.50 - 1.10 mg/dL   GFR calc non Af Amer 55 (*) >90 mL/min   GFR calc Af Amer 63 (*) >90 mL/min   Comment:            The eGFR has been calculated     using the CKD EPI equation.     This calculation has not been     validated in all clinical     situations.     eGFR's persistently     <90 mL/min signify     possible Chronic Kidney Disease.  CBC WITH DIFFERENTIAL     Status: Abnormal   Collection Time    12/17/12  3:10 AM      Result Value Range   WBC 11.7 (*) 4.0 - 10.5 K/uL   RBC 3.83 (*) 3.87 - 5.11 MIL/uL   Hemoglobin 11.5 (*) 12.0 - 15.0 g/dL   HCT 08.6 (*) 57.8 - 46.9 %   MCV 88.5  78.0 - 100.0 fL   MCH 30.0  26.0 - 34.0 pg   MCHC 33.9  30.0 - 36.0 g/dL   RDW 62.9  52.8 - 41.3 %   Platelets 275  150 - 400 K/uL   Neutrophils Relative 91 (*) 43 - 77 %   Neutro Abs 10.6 (*) 1.7 - 7.7 K/uL   Lymphocytes Relative 7 (*) 12 - 46 %   Lymphs Abs 0.9  0.7 - 4.0 K/uL   Monocytes Relative 2 (*) 3 - 12 %   Monocytes Absolute 0.2  0.1 - 1.0 K/uL   Eosinophils Relative 0  0 - 5 %   Eosinophils Absolute 0.0  0.0 - 0.7 K/uL   Basophils Relative 0  0 - 1 %   Basophils Absolute 0.0  0.0 - 0.1 K/uL  COMPREHENSIVE METABOLIC PANEL     Status: Abnormal   Collection Time  12/17/12  3:10 AM      Result Value Range   Sodium 131 (*) 135 - 145 mEq/L   Potassium 4.6  3.5 - 5.1 mEq/L   Chloride 99  96 - 112 mEq/L   CO2 23  19 - 32 mEq/L   Glucose, Bld 165 (*) 70 - 99 mg/dL   BUN 16  6 - 23 mg/dL   Creatinine, Ser 1.61  0.50 - 1.10 mg/dL   Calcium 8.5  8.4 - 09.6 mg/dL   Total Protein 6.6  6.0 - 8.3 g/dL   Albumin 3.1 (*) 3.5 - 5.2 g/dL   AST 51 (*) 0 - 37 U/L   ALT 46 (*) 0 - 35 U/L   Alkaline Phosphatase 60  39 - 117 U/L   Total Bilirubin 0.7  0.3 - 1.2 mg/dL   GFR calc non Af Amer 62 (*) >90 mL/min   GFR calc Af Amer 72 (*) >90 mL/min   Comment:            The eGFR has been calculated     using the CKD EPI equation.     This calculation has not been     validated in all clinical     situations.     eGFR's persistently     <90 mL/min signify     possible Chronic Kidney Disease.    Radiology/Results: Dg Ugi W/water Sol Cm  12/17/2012  *RADIOLOGY REPORT*  Clinical Data:  Postop for Roux-en-Y gastric bypass.  Status post 1 day.  UPPER GI SERIES WITH KUB  Technique:  Routine upper GI series was performed with 50 ml of Omnipaque-300  Fluoroscopy Time: 0.75 minutes  Comparison:  Preoperative study of 07/25/2012  Findings: The procedure scout film demonstrates a nonobstructive bowel gas pattern.  Mild prominent loops of small bowel within the mid abdomen.  Minimal S-shaped spinal curvature.  Initial images demonstrate normal  caliber of the gastric remnant. Prompt filling of the roux loop, without extravasation.  The roux loop is normal in caliber.  On series 22, a parallel loop of bowel opacifies, favored to be secondary to tortuosity of the roux loop. The jejunal jejunal anastomoses is not confidently identified.   IMPRESSION: No evidence of contrast extravasation to suggest postoperative leak.   Original Report Authenticated By: Jeronimo Greaves, M.D.     Anti-infectives: Anti-infectives   Start     Dose/Rate Route Frequency Ordered Stop   12/16/12 0600  cefOXitin (MEFOXIN) 2 g in dextrose 5 % 50 mL IVPB     2 g 100 mL/hr over 30 Minutes Intravenous On call to O.R. 12/15/12 1443 12/16/12 1247      Assessment/Plan: Problem List: Patient Active Problem List  Diagnosis  . HYPERTENSION  . ABNORMAL HEART RHYTHMS  . RHINOSINUSITIS, CHRONIC  . ALLERGIC RHINITIS  . HEADACHE, CHRONIC  . Hypertension  . Hyperlipidemia  . Hypothyroidism  . Chest pain  . GERD (gastroesophageal reflux disease)  . Dizziness  . Palpitations  . DIZZINESS  . CHEST PAIN UNSPECIFIED  . Dyspnea  . Obesity 48    Doing well.  D/C Foley and start PD 1 bariatric diet. Transfer to 5W 1 Day Post-Op    LOS: 1 day   Matt B. Daphine Deutscher, MD, [facility] [phone] beeper [phone]  12/17/2012 12:06 PM

## 2012-12-18 LAB — CBC WITH DIFFERENTIAL/PLATELET
Eosinophils Relative: 0 % (ref 0–5)
HCT: 29.9 % — ABNORMAL LOW (ref 36.0–46.0)
Hemoglobin: 10.1 g/dL — ABNORMAL LOW (ref 12.0–15.0)
Lymphocytes Relative: 11 % — ABNORMAL LOW (ref 12–46)
Lymphs Abs: 1.9 10*3/uL (ref 0.7–4.0)
MCV: 88.7 fL (ref 78.0–100.0)
Monocytes Absolute: 1.4 10*3/uL — ABNORMAL HIGH (ref 0.1–1.0)
Monocytes Relative: 8 % (ref 3–12)
RBC: 3.37 MIL/uL — ABNORMAL LOW (ref 3.87–5.11)
RDW: 13.8 % (ref 11.5–15.5)
WBC: 16.5 10*3/uL — ABNORMAL HIGH (ref 4.0–10.5)

## 2012-12-18 MED ORDER — OXYCODONE-ACETAMINOPHEN 5-325 MG/5ML PO SOLN: 5.0000 mL | ORAL | Status: DC | PRN

## 2012-12-18 NOTE — Progress Notes (Signed)
Patient is alert and oriented. VSS.  Patient is up sitting in chair, has ambulated in hallway.  Patient is wearing ted hose and compression hose while in bed and continues to use incentive spirometry.  Patient has minimal abdominal discomfort that is relieved with prn medication.  Patient tolerated POD 1 diet, no nausea or vomiting noted.  Patient is belching, no gas or BM.  Patient has follow appointments with [facility] and [facility].  The following discharged instructions reviewed with patient and sibling. Patient verbalized understanding.  Quenton Fetter, RN  GASTRIC BYPASS/SLEEVE DISCHARGE INSTRUCTIONS  Drs. Fredrik Rigger, [place], Wilson, and [place] Call if you have any problems.   Call [phone] and ask for the surgeon on call.    If you need immediate assistance come to the ER at [facility]. Tell the ER personnel that you are a new post-op gastric bypass patient. Signs and symptoms to report:   Severe vomiting or nausea. If you cannot tolerate clear liquids for longer than 1 day, you need to call your surgeon.    Abdominal pain which does not get better after taking your pain medication   Fever greater than 101 F degree   Difficulty breathing   Chest pain    Redness, swelling, drainage, or foul odor at incision sites    If your incisions open or pull apart   Swelling or pain in calf (lower leg)   Diarrhea, frequent watery, uncontrolled bowel movements.   Constipation, (no bowel movements for 3 days) if this occurs, Take Milk of Magnesia, 2 tablespoons by mouth, 3 times a day for 2 days if needed.  Call your doctor if constipation continues. Stop taking Milk of Magnesia once you have had a bowel movement. You may also use Miralax according to the label instructions.   Anything you consider "abnormal for you".   Normal side effects after Surgery:   Unable to sleep at night or concentrate   Irritability   Being tearful (crying) or depressed   These are common complaints, possibly related to  your anesthesia, stress of surgery and change in lifestyle, that usually go away a few weeks after surgery.  If these feelings continue, call your medical doctor.  Wound Care You may have surgical glue, steri-strips, or staples over your incisions after surgery.  Surgical glue:  Looks like a clear film over your incisions and will wear off gradually. Steri-strips: Strips of tape over your incisions. You may notice a yellowish color on the skin underneath the steri-strips. This is a substance used to make the steri-strips stick better. Do not pull the steri-strips off - let them fall off.  Staples: Cherlynn Polo may be removed before you leave the hospital. If you go home with staples, call [facility] [phone]) for an appointment with your surgeon's nurse to have staples removed in 7 - 10 days. Showering: You may shower two days after your surgery unless otherwise instructed by your surgeon. Wash gently around wounds with warm soapy water, rinse well, and gently pat dry.  If you have a drain, you may need someone to hold this while you shower. Avoid tub baths until staples are removed and incisions are healed.    Medications   Medications should be liquid or crushed if larger than the size of a dime.  Extended release pills should not be crushed.   Depending on the size and number of medications you take, you may need to stagger/change the time you take your medications so that you do  not over-fill your pouch.    Make sure you follow-up with your primary care physician to make medication adjustments needed during rapid weight loss and life-style adjustment.   If you are diabetic, follow up with the doctor that prescribes your diabetes medication(s) within one week after surgery and check your blood sugar regularly.   Do not drive while taking narcotics!   Do not take acetaminophen (Tylenol) and Roxicet or Lortab Elixir at the same time since these pain medications contain  acetaminophen.  Diet at home: (First 2 Weeks) You will see the nutritionist two weeks after your surgery. She will advance your diet if you are tolerating liquids well. Once at home, if you have severe vomiting or nausea and cannot tolerate clear liquids lasting longer than 1 day, call your surgeon.  Begin high protein shake 2 ounces every 3 hours, 5 - 6 times per day.  Gradually increase the amount you drink as tolerated.  You may find it easier to slowly sip shakes throughout the day.  It is important to get your proteins in first.   Protein Shake   Drink at least 2 ounces of shake 5-6 times per day   Each serving of protein shakes should have a minimum of 15 grams of protein and no more than 5 grams of carbohydrate    Increase the amount of protein shake you drink as tolerated   Protein powder may be added to fluids such as non-fat milk or Lactaid milk (limit to 20 grams added protein powder per serving   The initial goal is to drink at least 8 ounces of protein shake/drink per day (or as directed by the nutritionist). Some examples of protein shakes are [employer], [employer], EAS Edge HP, and Unjury. Hydration   Gradually increase the amount of water and other liquids as tolerated (See Acceptable Fluids)   Gradually increase the amount of protein shake as tolerated     Sip fluids slowly and throughout the day   May use Sugar substitutes, use sparingly (limit to 6 - 8 packets per day). Your fluid goal is 64 ounces of fluid daily. It may take a few weeks to build up to this.         32 oz (or more) should be clear liquids and 32 oz (or more) should be full liquids.         Liquids should not contain sugar, caffeine, or carbonation! Acceptable Fluids Clear Liquids:   Water or Sugar-free flavored water, Fruit H2O   Decaffeinated coffee or tea (sugar-free)   Crystal Lite, Wyler's Lite, Minute Maid Lite   Sugar-free Jell-O   Bouillon or broth   Sugar-free Popsicle:   *Less than 20  calories each; Limit 1 per day   Full Liquids:              Protein Shakes/Drinks + 2 choices per day of other full liquids shown below.    Other full liquids must be: No more than 12 grams of Carbs per serving,  No more than 3 grams of Fat per serving   Strained low-fat cream soup   Non-Fat milk   Fat-free Lactaid Milk   Sugar-free yogurt (Dannon Lite & Fit) Vitamins and Minerals (Start 1 day after surgery unless otherwise directed)   2 Chewable Multivitamin / Multimineral Supplement (i.e. Centrum for Adults)   Chewable Calcium Citrate with Vitamin D-3. Take 1500 mg each day.           (Example: 3  Chewable Calcium Plus 600 with Vitamin D-3 can be found at [facility])         Vitamin B-12, 350 - 500 micrograms (oral tablet) each day   Do not mix multivitamins containing iron with calcium supplements; take 2 hours   apart   Do not substitute Tums (calcium carbonate) for your calcium   Menstruating women and those at risk for anemia may need extra iron. Talk with your doctor to see if you need additional iron.    If you need extra iron:  Total daily Iron recommendations (including Vitamins) = 50 - 100 mg Iron/day Do not stop taking or change any vitamins or minerals until you talk to your nutritionist or surgeon. Your nutritionist and / or physician must approve all vitamin and mineral supplements. Exercise For maximum success, begin exercising as soon as your doctor recommends. Make sure your physician approves any physical activity.   Depending on fitness level, begin with a simple walking program   Walk 5-15 minutes each day, 7 days per week.    Slowly increase until you are walking 30-45 minutes per day   Consider joining our BELT program. [phone] or email belt@[employer] .edu Things to remember:    You may have sexual relations when you feel comfortable. It is VERY important for female patients to use a reliable birth control method. Fertility often increases after surgery. Do not get  pregnant for at least 18 months.   It is very important to keep all follow up appointments with your surgeon, nutritionist, primary care physician, and behavioral health practitioner. After the first year, please follow up with your bariatric surgeon at least once a year in order to maintain best weight loss results.  [facility]: [phone] [facility]: [phone]   Free counseling is available for you and your family through collaboration between [facility] and [place]. Please call [phone] and leave a message.    Consider purchasing a medical alert bracelet that says you had gastric bypass surgery.    [facility] has a free Bariatric Surgery Support Group that meets monthly, the 3rd Thursday, 6 pm, Classroom #1, [place]. You may register online at [email or web address], but registration is not necessary. Select Classes and Support Groups, Bariatric Surgery, or Call [phone]   Do not return to work or drive until cleared by your surgeon   Use your CPAP when sleeping if applicable   Do not lift anything greater than ten pounds for at least two weeks

## 2012-12-18 NOTE — Discharge Summary (Signed)
Physician Discharge Summary  Patient ID: Tracy Paul MRN: [id number] DOB/AGE: 1951-08-15 62 y.o.  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  Taken to stepdown postop.  No rooms on 5W so stayed on stepdown.  UGI ok.  Ready for discharge  Consults: none  Significant Diagnostic Studies: UGI    Discharge Exam: Blood pressure 138/67, pulse 64, temperature 97.8 F (36.6 C), temperature source Oral, resp. rate 19, height 5\' 4"  (1.626 m), weight 260 lb 2 oz (117.992 kg), SpO2 99.00%. Nontender.    Disposition: 01-Home or Self Care  Discharge Orders   Future Appointments Provider Department Dept Phone   12/30/2012 4:00 PM [id number] Post-Op Class [facility] [phone]   01/01/2013 10:20 AM Valarie Merino, MD [facility], [place] [phone]   Future Orders Complete By Expires     Call MD for:  difficulty breathing, headache or visual disturbances  As directed     Call MD for:  persistant nausea and vomiting  As directed     Call MD for:  redness, tenderness, or signs of infection (pain, swelling, redness, odor or green/yellow discharge around incision site)  As directed     Call MD for:  severe uncontrolled pain  As directed     Call MD for:  temperature >100.4  As directed     Diet Carb Modified  As directed     Discharge instructions  As directed     Comments:      Follow bariatric dietary recommendations    Increase activity slowly  As directed     No wound care  As directed         Medication List    STOP taking these medications       aspirin 81 MG tablet      TAKE these medications       acetaminophen 500 MG tablet  Commonly known as:  TYLENOL  Take 500  mg by mouth every 6 (six) hours as needed. For pain     CALCIUM 600 + D PO  Take 1 tablet by mouth daily.     diphenhydramine-acetaminophen 25-500 MG Tabs  Commonly known as:  TYLENOL PM  Take 1 tablet by mouth at bedtime.     Fish Oil 1200 MG Caps  Take 1 capsule by mouth daily.     gabapentin 300 MG capsule  Commonly known as:  NEURONTIN  Take 300 mg by mouth 2 (two) times daily as needed (spinalstonsis).     levothyroxine 75 MCG tablet  Commonly known as:  SYNTHROID, LEVOTHROID  Take 75 mcg by mouth daily with breakfast.     lisinopril-hydrochlorothiazide 20-25 MG per tablet  Commonly known as:  PRINZIDE,ZESTORETIC  Take 1 tablet by mouth daily with breakfast.     oxyCODONE-acetaminophen 5-325 MG/5ML solution  Commonly known as:  ROXICET  Take 5-10 mLs by mouth every 4 (four) hours as needed.     potassium gluconate 595 MG Tabs  Take 595 mg by mouth daily.     pravastatin 40 MG tablet  Commonly known as:  PRAVACHOL  Take 40 mg by mouth daily with breakfast.     TOPAMAX PO  Take 1 tablet  by mouth 2 (two) times daily. For migraine prevention     vitamin B-12 1000 MCG tablet  Commonly known as:  CYANOCOBALAMIN  Take 1,000 mcg by mouth daily.     Vitamin D3 50000 UNITS Caps  Take 1 capsule by mouth. I take one by mouth on Sunday, Monday, Wednesday, Friday, Saturday           Follow-up Information   Follow up with Valarie Merino, MD.   Contact information:   [address] [place] [address] [phone]       Signed: Valarie Merino 12/18/2012, 2:06 PM

## 2012-12-19 ENCOUNTER — Telehealth (INDEPENDENT_AMBULATORY_CARE_PROVIDER_SITE_OTHER): Payer: Self-pay | Admitting: General Surgery

## 2012-12-19 NOTE — Telephone Encounter (Signed)
Patient called back to clarify advise on Tylenol.  Explained to patient that it is just the liquid Children's Tylenol that we are suggesting.  The pharmacist can calculate the dose for her.  Patient states understanding and agreeable at this time.

## 2012-12-19 NOTE — Telephone Encounter (Signed)
Pt called to report that after taking oxycodone last night she is experiencing severe itching of hand and feet/ Noted codeine allergy only. I reviewed this with Dr. Daphine Deutscher and he said for pt to stop Oxycodone, take Benadryl liquid for itching and try Tylenol Elixir for pain. If the Tylenol is not effective she will contact our office for other advice/gy/ pt aware of recommendation/gy

## 2012-12-29 ENCOUNTER — Ambulatory Visit (HOSPITAL_COMMUNITY)
Admission: RE | Admit: 2012-12-29 | Discharge: 2012-12-29 | Disposition: A | Discharge status: Home / Self Care | Payer: Federal, State, Local not specified - PPO | Source: Ambulatory Visit | Attending: Internal Medicine | Admitting: Internal Medicine

## 2012-12-29 ENCOUNTER — Other Ambulatory Visit (HOSPITAL_COMMUNITY): Payer: Self-pay | Admitting: Internal Medicine

## 2012-12-29 DIAGNOSIS — R05 Cough: Secondary | ICD-10-CM

## 2012-12-29 DIAGNOSIS — R0989 Other specified symptoms and signs involving the circulatory and respiratory systems: Secondary | ICD-10-CM | POA: Insufficient documentation

## 2012-12-29 DIAGNOSIS — R059 Cough, unspecified: Secondary | ICD-10-CM | POA: Insufficient documentation

## 2012-12-30 ENCOUNTER — Encounter: Payer: Federal, State, Local not specified - PPO | Attending: Surgery | Admitting: *Deleted

## 2012-12-30 VITALS — Ht 63.0 in | Wt 250.5 lb

## 2012-12-30 DIAGNOSIS — Z713 Dietary counseling and surveillance: Secondary | ICD-10-CM | POA: Insufficient documentation

## 2012-12-30 DIAGNOSIS — Z01818 Encounter for other preprocedural examination: Secondary | ICD-10-CM | POA: Insufficient documentation

## 2012-12-31 NOTE — Patient Instructions (Signed)
Patient to follow Phase 3A-Soft, High Protein Diet and follow-up at [facility] in 6 weeks for 2 months post-op nutrition visit for diet advancement. 

## 2012-12-31 NOTE — Progress Notes (Signed)
Bariatric Class:  Appt start time: 1600 end time:  1700.  2 Week Post-Operative Nutrition Class  Patient was seen on 12/30/12 for Post-Operative Nutrition education at [facility].   Surgery date: 12/16/12  Surgery type: RYGB  Start weight at [facility]: 270.2 lbs (08/04/12)   Weight today: 250.5 lbs Weight change: 18.9 lbs  Total weight lost: 19.7 lbs  Goal weight: 145-150 lbs % goal met: 16%  TANITA  BODY COMP RESULTS  12/04/12 12/30/12   BMI (kg/m^2) 47.7 44.4   Fat Mass (lbs) -- 137.5   Fat Free Mass (lbs) -- 113.0   Total Body Water (lbs) -- 82.5   The following the learning objectives were met by the patient during this course:   Identifies Phase 3A (Soft, High Proteins) Dietary Goals and will begin from 2 weeks post-operatively to 2 months post-operatively  Identifies appropriate sources of fluids and proteins   States protein recommendations and appropriate sources post-operatively  Identifies the need for appropriate texture modifications, mastication, and bite sizes when consuming solids  Identifies appropriate multivitamin and calcium sources post-operatively  Describes the need for physical activity post-operatively and will follow MD recommendations  States when to call healthcare provider regarding medication questions or post-operative complications  Handouts given during class include:  Phase 3A: Soft, High Protein Diet Handout  Follow-Up Plan: Patient will follow-up at [facility] in 6 weeks for 2 months post-op nutrition visit for diet advancement per MD.

## 2013-01-01 ENCOUNTER — Other Ambulatory Visit (INDEPENDENT_AMBULATORY_CARE_PROVIDER_SITE_OTHER): Payer: Self-pay

## 2013-01-01 ENCOUNTER — Ambulatory Visit (HOSPITAL_COMMUNITY)
Admission: RE | Admit: 2013-01-01 | Discharge: 2013-01-01 | Disposition: A | Discharge status: Home / Self Care | Payer: Federal, State, Local not specified - PPO | Source: Ambulatory Visit | Attending: Surgery | Admitting: Surgery

## 2013-01-01 ENCOUNTER — Ambulatory Visit (INDEPENDENT_AMBULATORY_CARE_PROVIDER_SITE_OTHER): Payer: Federal, State, Local not specified - PPO | Admitting: Surgery

## 2013-01-01 ENCOUNTER — Encounter (HOSPITAL_COMMUNITY): Payer: Self-pay

## 2013-01-01 VITALS — BP 98/76 | HR 108 | Temp 98.3°F | Resp 20 | Ht 64.0 in | Wt 249.6 lb

## 2013-01-01 DIAGNOSIS — I700 Atherosclerosis of aorta: Secondary | ICD-10-CM | POA: Insufficient documentation

## 2013-01-01 DIAGNOSIS — Z9884 Bariatric surgery status: Secondary | ICD-10-CM

## 2013-01-01 DIAGNOSIS — R0602 Shortness of breath: Secondary | ICD-10-CM | POA: Insufficient documentation

## 2013-01-01 DIAGNOSIS — I2510 Atherosclerotic heart disease of native coronary artery without angina pectoris: Secondary | ICD-10-CM | POA: Insufficient documentation

## 2013-01-01 MED ORDER — IOHEXOL 350 MG/ML SOLN
100.0000 mL | Freq: Once | INTRAVENOUS | Status: AC | PRN
  Administered 2013-01-01: 100 mL via INTRAVENOUS

## 2013-01-01 NOTE — Progress Notes (Signed)
Tracy Paul 62 y.o.  Body mass index is 42.82 kg/(m^2).  Patient Active Problem List  Diagnosis  . HYPERTENSION  . ABNORMAL HEART RHYTHMS  . RHINOSINUSITIS, CHRONIC  . ALLERGIC RHINITIS  . HEADACHE, CHRONIC  . Hypertension  . Hyperlipidemia  . Hypothyroidism  . Chest pain  . GERD (gastroesophageal reflux disease)  . Dizziness  . Palpitations  . DIZZINESS  . CHEST PAIN UNSPECIFIED  . Dyspnea  . Obesity 48  . 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 Codeine Other (See Comments)    "puts me out of it"    Past Surgical History  Procedure Laterality Date  . Bilateral carpal tunnel surgery    . Replacement total knee bilateral      bilat  . Nasal sinus surgery    . Tubal ligation    . Breath tek h pylori  07/01/2012    Procedure: BREATH TEK H PYLORI;  Surgeon: Mariella Saa, MD;  Location: [facility];  Service: General;  Laterality: N/A;  . 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  Post gastric bypass.  Has been doing well.  Recently drainage and shortness of breath treated by [facility].  They had told her to go the the ER for a CT scan.  Our office will schedule her for a CT scan to rule out PE. If that's ok then we will see her back in 2 months.  Matt B. Daphine Deutscher, MD, [facility] [phone] beeper [phone]  01/01/2013 11:21 AM

## 2013-02-10 ENCOUNTER — Ambulatory Visit: Payer: Federal, State, Local not specified - PPO | Admitting: *Deleted

## 2013-02-18 ENCOUNTER — Encounter: Payer: Self-pay | Admitting: *Deleted

## 2013-02-18 ENCOUNTER — Encounter: Payer: Federal, State, Local not specified - PPO | Attending: Surgery | Admitting: *Deleted

## 2013-02-18 VITALS — Ht 63.0 in | Wt 239.5 lb

## 2013-02-18 DIAGNOSIS — Z713 Dietary counseling and surveillance: Secondary | ICD-10-CM | POA: Insufficient documentation

## 2013-02-18 DIAGNOSIS — E669 Obesity, unspecified: Secondary | ICD-10-CM

## 2013-02-18 DIAGNOSIS — Z01818 Encounter for other preprocedural examination: Secondary | ICD-10-CM | POA: Insufficient documentation

## 2013-02-18 NOTE — Patient Instructions (Addendum)
Goals:  Follow Phase 3B: High Protein + Non-Starchy Vegetables  Increase lean protein foods to meet 60-80g goal  Increase fluid intake to 64oz +  Aim for >30 min of physical activity daily  TANITA  BODY COMP RESULTS  12/04/12 12/30/12 02/18/13   BMI (kg/m^2) 47.7 44.4  42.4   Fat Mass (lbs) -- 137.5 114.0   Fat Free Mass (lbs) -- 113.0 125.5   Total Body Water (lbs) -- 82.5 92.0

## 2013-02-18 NOTE — Progress Notes (Signed)
Follow-up visit:  8 Weeks Post-Operative RYGB Surgery  Medical Nutrition Therapy:  Appt start time: 1530  End time:  1615.  Primary concerns today: Post-operative Bariatric Surgery Nutrition Management. Doing well.  Reports indigestion after eating or drinking anything. Advised to contact Dr. Daphine Deutscher asap.   Surgery date: 12/16/12  Surgery type: RYGB  Start weight at [facility]: 270.2 lbs (08/04/12)   Weight today: 239.5 lbs Weight change: 11.0 lbs  Total weight lost: 30.7 lbs  Goal weight: 145-150 lbs % goal met: 25-26%  TANITA  BODY COMP RESULTS  12/04/12 12/30/12 02/18/13   BMI (kg/m^2) 47.7 44.4  42.4   Fat Mass (lbs) -- 137.5 114.0   Fat Free Mass (lbs) -- 113.0 125.5   Total Body Water (lbs) -- 82.5 92.0   Fluid intake:  Water, small amount of apple juice or sweet tea Estimated total protein intake: 2-3 oz lean protein @ meals = 60-75g  Medications: See medication list. Off all HTN meds d/t low BPs Supplementation: Taking regularly; calcium may be incorrect form. Will check.  Using straws: No Drinking while eating: No Hair loss: No; taking biotin daily (unsure of dose) Carbonated beverages:  No N/V/D/C:  Chicken not well tolerated. Indigestion after eating or drinking anything. ? if ulcer; advised to tell Dr. Daphine Deutscher on 03/06/13 Dumping syndrome:  No  Recent physical activity:  Rides bike @ 15-20 min, Rowing machine (messed knee up)  Progress Towards Goal(s):  In progress.  Handouts given during visit include:  Phase 3B: High Protein + Non-Starchy Vegetables   Nutritional Diagnosis:  [place]-3.3 Overweight/obesity related to past poor dietary habits and physical inactivity as evidenced by patient w/ recent RYGB surgery following dietary guidelines for continued weight loss.  Intervention:  Nutrition education/diet advancement.  Monitoring/Evaluation:  Dietary intake, exercise, and body weight. Follow up in 1 months for 3 month post-op visit.

## 2013-03-06 ENCOUNTER — Encounter (INDEPENDENT_AMBULATORY_CARE_PROVIDER_SITE_OTHER): Payer: Self-pay | Admitting: Surgery

## 2013-03-06 ENCOUNTER — Ambulatory Visit (INDEPENDENT_AMBULATORY_CARE_PROVIDER_SITE_OTHER): Payer: Federal, State, Local not specified - PPO | Admitting: Surgery

## 2013-03-06 VITALS — BP 140/88 | HR 86 | Temp 97.3°F | Resp 18 | Ht 64.0 in | Wt 226.4 lb

## 2013-03-06 DIAGNOSIS — Z9884 Bariatric surgery status: Secondary | ICD-10-CM

## 2013-03-06 NOTE — Patient Instructions (Addendum)
Thanks for your patience.  If you need further assistance after leaving the office, please call our office and speak with a [facility] nurse.  [phone].  If you want to leave a message for Dr. Viktoria Gruetzmacher, please call his office phone at [phone]. 

## 2013-03-06 NOTE — Progress Notes (Signed)
Tracy Paul 62 y.o.  Body mass index is 38.84 kg/(m^2).  Patient Active Problem List   Diagnosis Date Noted  . 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 Obesity 48 06/18/2012  . Dyspnea 01/26/2011  . Dizziness 01/10/2011  . Palpitations 01/10/2011  . DIZZINESS 01/10/2011  . CHEST PAIN UNSPECIFIED 01/10/2011  . Hypertension   . Hyperlipidemia   . Hypothyroidism   . Chest pain   . GERD (gastroesophageal reflux disease)   . HYPERTENSION 09/26/2010  . ABNORMAL HEART RHYTHMS 09/26/2010  . 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 ALLERGIC RHINITIS 09/26/2010  . HEADACHE, CHRONIC 09/26/2010    Allergies  Allergen Reactions  . Codeine Other (See Comments)    "puts me out of it"    Past Surgical History  Procedure Laterality Date  . Bilateral carpal tunnel surgery    . Replacement total knee bilateral      bilat  . Nasal sinus surgery    . Tubal ligation    . Breath tek h pylori  07/01/2012    Procedure: BREATH TEK H PYLORI;  Surgeon: Mariella Saa, MD;  Location: [facility];  Service: General;  Laterality: N/A;  . 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  Today's weight is 226.4 down from 271.  She is feeling good.  Exercising more.  Labs drawn by Oneta Rack were normal.  Doing well.   Return 4 months.   Matt B. Daphine Deutscher, MD, [facility] [phone] beeper [phone]  03/06/2013 4:46 PM

## 2013-03-13 ENCOUNTER — Encounter (INDEPENDENT_AMBULATORY_CARE_PROVIDER_SITE_OTHER): Payer: Self-pay

## 2013-03-18 ENCOUNTER — Encounter: Payer: Federal, State, Local not specified - PPO | Attending: Surgery | Admitting: *Deleted

## 2013-03-18 ENCOUNTER — Encounter: Payer: Self-pay | Admitting: *Deleted

## 2013-03-18 VITALS — Ht 63.0 in | Wt 223.0 lb

## 2013-03-18 DIAGNOSIS — Z01818 Encounter for other preprocedural examination: Secondary | ICD-10-CM | POA: Insufficient documentation

## 2013-03-18 DIAGNOSIS — E669 Obesity, unspecified: Secondary | ICD-10-CM

## 2013-03-18 DIAGNOSIS — Z713 Dietary counseling and surveillance: Secondary | ICD-10-CM | POA: Insufficient documentation

## 2013-03-18 NOTE — Patient Instructions (Addendum)
Goals:  Follow Phase 3B: High Protein + Non-Starchy Vegetables  Eat 3-6 small meals/snacks, every 3-5 hrs  Increase lean protein foods to meet 60-80g goal  Increase fluid intake to 64oz +  Add 15 grams of carbohydrate (fruit, whole grain) with meals  Avoid drinking 15 minutes before, during and 30 minutes after eating  Aim for >30 min of physical activity daily

## 2013-03-18 NOTE — Progress Notes (Signed)
Follow-up visit:  12 Weeks Post-Operative RYGB Surgery  Medical Nutrition Therapy:  Appt start time: 1530  End time:  1615.  Primary concerns today: Post-operative Bariatric Surgery Nutrition Management. Doing well, though reports indigestion "after eating or drinking anything". Advised to contact Dr. Daphine Deutscher asap.   Surgery date: 12/16/12  Surgery type: RYGB  Start weight at [facility]: 270.2 lbs (08/04/12)   Weight today: 223.0 lbs Weight change: 16.5 lbs  Total weight lost: 47.2 lbs  Goal weight: 145-150 lbs % goal met: 40%  TANITA  BODY COMP RESULTS  12/04/12 12/30/12 02/18/13 03/18/13   BMI (kg/m^2) 47.7 44.4  42.4 39.5   Fat Mass (lbs) -- 137.5 114.0 109.5   Fat Free Mass (lbs) -- 113.0 125.5 113.5   Total Body Water (lbs) -- 82.5 92.0 83.0    Fluid intake:  Water, small amount of apple juice or sweet tea = 50-70 oz Estimated total protein intake:  2 oz lean protein @ meals = 60-75 g  Medications: See medication list. Back on HCTZ daily d/t slightly rising BP Supplementation: Taking regularly; calcium may be incorrect form. Checking when she gets home  Using straws: No Drinking while eating: No Hair loss:  No Carbonated beverages:  No N/V/D/C:  Tolerating chicken better; indigestion resolving. Has slowed down eating and helping to resolve Dumping syndrome:  No  Recent physical activity:  Rides bike 2-3 days/week @ 25-30 min  Progress Towards Goal(s):  In progress.   Nutritional Diagnosis:  [place]-3.3 Overweight/obesity related to past poor dietary habits and physical inactivity as evidenced by patient w/ recent RYGB surgery following dietary guidelines for continued weight loss.  Intervention:  Nutrition education/reinforcement  Monitoring/Evaluation:  Dietary intake, exercise, and body weight. Follow up in 3 months for 6 month post-op visit.

## 2013-06-17 ENCOUNTER — Ambulatory Visit: Payer: Federal, State, Local not specified - PPO | Admitting: *Deleted

## 2013-07-01 ENCOUNTER — Encounter: Payer: Self-pay | Admitting: Dietician

## 2013-07-01 ENCOUNTER — Encounter: Payer: Federal, State, Local not specified - PPO | Attending: Surgery | Admitting: Dietician

## 2013-07-01 VITALS — Ht 63.0 in | Wt 195.0 lb

## 2013-07-01 DIAGNOSIS — Z01818 Encounter for other preprocedural examination: Secondary | ICD-10-CM | POA: Insufficient documentation

## 2013-07-01 DIAGNOSIS — E669 Obesity, unspecified: Secondary | ICD-10-CM

## 2013-07-01 DIAGNOSIS — Z713 Dietary counseling and surveillance: Secondary | ICD-10-CM | POA: Insufficient documentation

## 2013-07-01 NOTE — Progress Notes (Signed)
Follow-up visit:  6 Month Post-Operative RYGB Surgery  Medical Nutrition Therapy:  Appt start time: 830  End time:  900.  Primary concerns today: Post-operative Bariatric Surgery Nutrition Management. Eating about 4 x times per day and (2-3 oz protein) with non-starchy vegetables. Reports using some straws and drinking "a little" with meals.    Surgery date: 12/16/12  Surgery type: RYGB  Start weight at [facility]: 270.2 lbs (08/04/12)   Weight today: 195 lbs Weight change: 28 lbs  Total weight lost: 75.2 lbs  Goal weight: 145-150 lbs % goal met: 60%  TANITA  BODY COMP RESULTS  12/04/12 12/30/12 02/18/13 03/18/13 07/01/13   BMI (kg/m^2) 47.7 44.4  42.4 39.5 34.5   Fat Mass (lbs) -- 137.5 114.0 109.5 87.0   Fat Free Mass (lbs) -- 113.0 125.5 113.5 108.0   Total Body Water (lbs) -- 82.5 92.0 83.0  79.0   Fluid intake:  Water, or small amount of sweet tea = 50-70 oz Estimated total protein intake:  2 oz lean protein @ meals = 60-75 g  Medications: See medication list. Taking B12 2 x week d/t high serum levels Supplementation: Taking regularly  Using straws: Yes, everyday  Drinking while eating: Not usually, sips if needed  Hair loss:  No Carbonated beverages:  No N/V/D/C:  No problems usually if eating slowly. Dumping syndrome:  No  Recent physical activity:  Walking 30 minutes 5 x week   Progress Towards Goal(s):  In progress.   Nutritional Diagnosis:  [place]-3.3 Overweight/obesity related to past poor dietary habits and physical inactivity as evidenced by patient w/ recent RYGB surgery following dietary guidelines for continued weight loss.  Intervention:  Nutrition education/reinforcement  Monitoring/Evaluation:  Dietary intake, exercise, and body weight. Follow up in 4 months for 10 month post-op visit.

## 2013-07-01 NOTE — Patient Instructions (Addendum)
Goals:  Follow Phase 3B: High Protein + Non-Starchy Vegetables  Eat 3-6 small meals/snacks, every 3-5 hrs  Increase lean protein foods to meet 60-80g goal  Increase fluid intake to 64oz +  Add 15 grams of carbohydrate (fruit, whole grain) with meals  Avoid drinking 15 minutes before, during and 30 minutes after eating  Aim for >30 min of physical activity daily

## 2013-08-26 ENCOUNTER — Other Ambulatory Visit: Payer: Self-pay | Admitting: Internal Medicine

## 2013-08-26 DIAGNOSIS — R109 Unspecified abdominal pain: Secondary | ICD-10-CM

## 2013-09-02 ENCOUNTER — Ambulatory Visit
Admission: RE | Admit: 2013-09-02 | Discharge: 2013-09-02 | Disposition: A | Discharge status: Home / Self Care | Payer: Federal, State, Local not specified - PPO | Source: Ambulatory Visit | Attending: Internal Medicine | Admitting: Internal Medicine

## 2013-09-02 DIAGNOSIS — R109 Unspecified abdominal pain: Secondary | ICD-10-CM

## 2013-09-28 ENCOUNTER — Other Ambulatory Visit: Payer: Self-pay | Admitting: Internal Medicine

## 2013-10-03 ENCOUNTER — Encounter (HOSPITAL_COMMUNITY): Payer: Self-pay | Admitting: Emergency Medicine

## 2013-10-03 ENCOUNTER — Emergency Department (HOSPITAL_COMMUNITY)
Admission: EM | Admit: 2013-10-03 | Discharge: 2013-10-03 | Disposition: A | Discharge status: Home / Self Care | Payer: Federal, State, Local not specified - PPO | Attending: Emergency Medicine | Admitting: Emergency Medicine

## 2013-10-03 ENCOUNTER — Emergency Department (HOSPITAL_COMMUNITY): Payer: Federal, State, Local not specified - PPO

## 2013-10-03 DIAGNOSIS — Z87891 Personal history of nicotine dependence: Secondary | ICD-10-CM | POA: Insufficient documentation

## 2013-10-03 DIAGNOSIS — I10 Essential (primary) hypertension: Secondary | ICD-10-CM | POA: Insufficient documentation

## 2013-10-03 DIAGNOSIS — R1013 Epigastric pain: Secondary | ICD-10-CM | POA: Insufficient documentation

## 2013-10-03 DIAGNOSIS — G8929 Other chronic pain: Secondary | ICD-10-CM | POA: Insufficient documentation

## 2013-10-03 DIAGNOSIS — Z7982 Long term (current) use of aspirin: Secondary | ICD-10-CM | POA: Insufficient documentation

## 2013-10-03 DIAGNOSIS — Z79899 Other long term (current) drug therapy: Secondary | ICD-10-CM | POA: Insufficient documentation

## 2013-10-03 DIAGNOSIS — K219 Gastro-esophageal reflux disease without esophagitis: Secondary | ICD-10-CM | POA: Insufficient documentation

## 2013-10-03 DIAGNOSIS — R51 Headache: Secondary | ICD-10-CM | POA: Insufficient documentation

## 2013-10-03 DIAGNOSIS — E785 Hyperlipidemia, unspecified: Secondary | ICD-10-CM | POA: Insufficient documentation

## 2013-10-03 DIAGNOSIS — R509 Fever, unspecified: Secondary | ICD-10-CM | POA: Insufficient documentation

## 2013-10-03 DIAGNOSIS — E039 Hypothyroidism, unspecified: Secondary | ICD-10-CM | POA: Insufficient documentation

## 2013-10-03 LAB — COMPREHENSIVE METABOLIC PANEL
AST: 25 U/L (ref 0–37)
Alkaline Phosphatase: 93 U/L (ref 39–117)
BUN: 12 mg/dL (ref 6–23)
CO2: 29 mEq/L (ref 19–32)
Calcium: 9.2 mg/dL (ref 8.4–10.5)
Chloride: 104 mEq/L (ref 96–112)
Creatinine, Ser: 0.78 mg/dL (ref 0.50–1.10)
GFR calc Af Amer: >90 mL/min (ref >90)
GFR calc non Af Amer: 88 mL/min — ABNORMAL LOW (ref >90)
Glucose, Bld: 127 mg/dL — ABNORMAL HIGH (ref 70–99)
Total Bilirubin: 1.4 mg/dL — ABNORMAL HIGH (ref 0.3–1.2)

## 2013-10-03 LAB — URINALYSIS, ROUTINE W REFLEX MICROSCOPIC
Glucose, UA: NEGATIVE mg/dL
Ketones, ur: NEGATIVE mg/dL
Leukocytes, UA: NEGATIVE
Protein, ur: NEGATIVE mg/dL
Urobilinogen, UA: 0.2 mg/dL (ref 0.0–1.0)

## 2013-10-03 LAB — CBC WITH DIFFERENTIAL/PLATELET
Eosinophils Relative: 3 % (ref 0–5)
HCT: 37.8 % (ref 36.0–46.0)
Hemoglobin: 12.7 g/dL (ref 12.0–15.0)
Lymphocytes Relative: 52 % — ABNORMAL HIGH (ref 12–46)
Lymphs Abs: 3.2 10*3/uL (ref 0.7–4.0)
MCH: 31.3 pg (ref 26.0–34.0)
MCV: 93.1 fL (ref 78.0–100.0)
Monocytes Absolute: 0.5 10*3/uL (ref 0.1–1.0)
Monocytes Relative: 8 % (ref 3–12)
Neutro Abs: 2.2 10*3/uL (ref 1.7–7.7)
RDW: 14.1 % (ref 11.5–15.5)
WBC: 6.1 10*3/uL (ref 4.0–10.5)

## 2013-10-03 LAB — POCT I-STAT TROPONIN I

## 2013-10-03 LAB — LIPASE, BLOOD: Lipase: 31 U/L (ref 11–59)

## 2013-10-03 MED ORDER — ONDANSETRON HCL 4 MG/2ML IJ SOLN
4.0000 mg | Freq: Once | INTRAMUSCULAR | Status: AC
  Administered 2013-10-03: 4 mg via INTRAVENOUS
  Filled 2013-10-03: qty 2

## 2013-10-03 MED ORDER — SODIUM CHLORIDE 0.9 % IV SOLN: INTRAVENOUS | Status: DC

## 2013-10-03 MED ORDER — IOHEXOL 300 MG/ML  SOLN
25.0000 mL | INTRAMUSCULAR | Status: AC
  Administered 2013-10-03: 25 mL via ORAL

## 2013-10-03 MED ORDER — IOHEXOL 300 MG/ML  SOLN
100.0000 mL | Freq: Once | INTRAMUSCULAR | Status: AC | PRN
  Administered 2013-10-03: 100 mL via INTRAVENOUS

## 2013-10-03 MED ORDER — SODIUM CHLORIDE 0.9 % IV BOLUS (SEPSIS)
250.0000 mL | Freq: Once | INTRAVENOUS | Status: AC
  Administered 2013-10-03: 250 mL via INTRAVENOUS

## 2013-10-03 MED ORDER — HYDROMORPHONE HCL PF 1 MG/ML IJ SOLN
1.0000 mg | Freq: Once | INTRAMUSCULAR | Status: AC
  Administered 2013-10-03: 1 mg via INTRAVENOUS
  Filled 2013-10-03: qty 1

## 2013-10-03 NOTE — Discharge Instructions (Signed)
Abdominal Pain Many things can cause belly (abdominal) pain. Most times, the belly pain is not dangerous. The amount of belly pain does not tell how serious the problem may be. Many cases of belly pain can be watched and treated at home. HOME CARE   Do not take medicines that help you go poop (laxatives) unless told to by your doctor.  Only take medicine as told by your doctor.  Eat or drink as told by your doctor. Your doctor will tell you if you should be on a special diet. GET HELP RIGHT AWAY IF:   The pain does not go away.  You have a fever.  You keep throwing up (vomiting).  The pain changes and is only in the right or left part of the belly.  You have bloody or tarry looking poop. MAKE SURE YOU:   Understand these instructions.  Will watch your condition.  Will get help right away if you are not doing well or get worse. Document Released: 04/02/2008 Document Revised: 01/07/2012 Document Reviewed: 10/31/2009 [facility] Patient Information 2014 [place], [place]. Tonight you ultrasound again shows some sludge but no stones  Please Call Dr. Daphine Deutscher to set a follow up appointment

## 2013-10-03 NOTE — ED Notes (Signed)
Patient returned from CT

## 2013-10-03 NOTE — ED Notes (Addendum)
Patient transported to US 

## 2013-10-03 NOTE — ED Notes (Signed)
Denies wanting medication for pain.

## 2013-10-03 NOTE — ED Notes (Signed)
Per pt sts about 3 hours ago sudden onset of epigastric and RUQ pain. sts associated with some nausea. Describes the pain as sharp.

## 2013-10-03 NOTE — ED Notes (Signed)
Patient returned from Ultrasound. 

## 2013-10-03 NOTE — ED Provider Notes (Signed)
CSN: [id number]     Arrival date & time 10/03/13  1451 History   First MD Initiated Contact with Patient 10/03/13 1540     Chief Complaint  Patient presents with  . Abdominal Pain   (Consider location/radiation/quality/duration/timing/severity/associated sxs/prior Treatment) Patient is a 62 y.o. female presenting with abdominal pain. The history is provided by the patient and the spouse.  Abdominal Pain Associated symptoms: fever and nausea   Associated symptoms: no chest pain, no dysuria, no shortness of breath and no vomiting    patient with acute onset of epigastric right upper quadrant abdominal pain 3 hours ago. Pain was very sharp in nature associated with nausea no vomiting it is now decreased to about a 2/10 it was a 10 out of 10. Patient has not had pain like this before. 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 Patient states nothing makes the pain worse or better. It is sharp in nature.  Past Medical History  Diagnosis Date  . Hypertension   . Hyperlipidemia   . Hypothyroidism   . Thyroid nodule   . GERD (gastroesophageal reflux disease)   . Chest pain     History of normal cardiac catheterization x2; last heart catheter 2000; echocardiogram November 2009: EF 60%; LAE;    Myoview 3/12: EF 60%, no ischemia  . Allergic rhinitis   . Chronic headache   . Morbid obesity   . PONV (postoperative nausea and vomiting)    Past Surgical History  Procedure Laterality Date  . Bilateral carpal tunnel surgery    . Replacement total knee bilateral      bilat  . Nasal sinus surgery    . Tubal ligation    . Breath tek h pylori  07/01/2012    Procedure: BREATH TEK H PYLORI;  Surgeon: Mariella Saa, MD;  Location: [facility];  Service: General;  Laterality: N/A;  . 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 Breast cancer Mother   . Cancer Mother     breast cancer  . Anuerysm Mother     brain anuerysm  . Heart attack Father   . Heart disease Father   . Heart attack Sister    History  Substance Use Topics  . Smoking status: Former Smoker -- 0.80 packs/day for 10 years    Types: Cigarettes    Quit date: 10/30/1979  . Smokeless tobacco: Not on file  . Alcohol Use: No   OB History   Grav Para Term Preterm Abortions TAB SAB Ect Mult Living                 Review of Systems  Constitutional: Positive for fever.  HENT: Negative for congestion.   Eyes: Negative for redness.  Respiratory: Negative for shortness of breath.   Cardiovascular: Negative for chest pain.  Gastrointestinal: Positive for nausea and abdominal pain. Negative for vomiting.  Genitourinary: Negative for dysuria.  Musculoskeletal: Negative for back pain.  Skin: Negative for rash.  Neurological: Negative for headaches.  Hematological: Does not bruise/bleed easily.  Psychiatric/Behavioral: Negative for confusion.    Allergies  Codeine  Home Medications   Current Outpatient Rx  Name  Route  Sig  Dispense  Refill  . acetaminophen (TYLENOL) 500 MG tablet  Oral   Take 500 mg by mouth every 6 (six) hours as needed. For pain         . aspirin 81 MG tablet   Oral   Take 81 mg by mouth daily.         . Biotin 10 MG CAPS   Oral   Take 1 capsule by mouth daily.         . Calcium Citrate-Vitamin D (CALCIUM CITRATE + D3 PO)   Oral   Take 500 mg by mouth 3 (three) times daily.         . Cholecalciferol (VITAMIN D3) 50000 UNITS CAPS   Oral   Take 1 capsule by mouth. I take one by mouth on Sunday, Monday, Wednesday, Friday, Saturday         . diphenhydramine-acetaminophen (TYLENOL PM) 25-500 MG TABS   Oral   Take 1 tablet by mouth at bedtime.          . fish oil-omega-3 fatty acids 1000 MG capsule   Oral    Take 1 g by mouth daily.         . hydrochlorothiazide (HYDRODIURIL) 25 MG tablet   Oral   Take 25 mg by mouth daily.          Marland Kitchen levothyroxine (SYNTHROID, LEVOTHROID) 100 MCG tablet   Oral   Take 100 mcg by mouth daily before breakfast. M & Th take 100 mcg; All other days take 50 mcg.         . levothyroxine (SYNTHROID, LEVOTHROID) 75 MCG tablet   Oral   Take 75 mcg by mouth daily with breakfast.          . oxyCODONE-acetaminophen (PERCOCET/ROXICET) 5-325 MG per tablet   Oral   Take 1 tablet by mouth every 4 (four) hours as needed for moderate pain or severe pain.         . potassium gluconate 595 MG TABS   Oral   Take 595 mg by mouth daily.          . pravastatin (PRAVACHOL) 40 MG tablet   Oral   Take 20 mg by mouth every other day.          . vitamin B-12 (CYANOCOBALAMIN) 1000 MCG tablet   Oral   Take 1,000 mcg by mouth 2 (two) times a week.           BP 116/62  Pulse 54  Temp(Src) 97.9 F (36.6 C) (Oral)  Resp 16  Ht 5\' 4"  (1.626 m)  Wt 175 lb (79.379 kg)  BMI 30.02 kg/m2  SpO2 99% Physical Exam  Nursing note and vitals reviewed. Constitutional: She is oriented to person, place, and time. She appears well-developed and well-nourished. No distress.  HENT:  Head: Normocephalic and atraumatic.  Mouth/Throat: Oropharynx is clear and moist.  Eyes: Conjunctivae and EOM are normal. Pupils are equal, round, and reactive to light.  Neck: Normal range of motion.  Cardiovascular: Normal rate, regular rhythm and normal heart sounds.   No murmur heard. Pulmonary/Chest: Effort normal and breath sounds normal.  Abdominal: Soft. Bowel sounds are normal. There is no tenderness.  Musculoskeletal: Normal range of motion.  Neurological: She is alert and oriented to person, place, and time. No cranial nerve deficit. She exhibits normal muscle tone. Coordination normal.  Skin: Skin is warm. No rash noted. No erythema.    ED Course  Procedures (including critical  care time) Labs Review Labs Reviewed  CBC WITH DIFFERENTIAL - Abnormal;  Notable for the following:    Neutrophils Relative % 36 (*)    Lymphocytes Relative 52 (*)    All other components within normal limits  COMPREHENSIVE METABOLIC PANEL - Abnormal; Notable for the following:    Potassium 3.2 (*)    Glucose, Bld 127 (*)    Total Bilirubin 1.4 (*)    GFR calc non Af Amer 88 (*)    All other components within normal limits  LIPASE, BLOOD  URINALYSIS, ROUTINE W REFLEX MICROSCOPIC  TROPONIN I  POCT I-STAT TROPONIN I   Results for orders placed during the hospital encounter of 10/03/13  CBC WITH DIFFERENTIAL      Result Value Range   WBC 6.1  4.0 - 10.5 K/uL   RBC 4.06  3.87 - 5.11 MIL/uL   Hemoglobin 12.7  12.0 - 15.0 g/dL   HCT 16.1  09.6 - 04.5 %   MCV 93.1  78.0 - 100.0 fL   MCH 31.3  26.0 - 34.0 pg   MCHC 33.6  30.0 - 36.0 g/dL   RDW 40.9  81.1 - 91.4 %   Platelets 249  150 - 400 K/uL   Neutrophils Relative % 36 (*) 43 - 77 %   Neutro Abs 2.2  1.7 - 7.7 K/uL   Lymphocytes Relative 52 (*) 12 - 46 %   Lymphs Abs 3.2  0.7 - 4.0 K/uL   Monocytes Relative 8  3 - 12 %   Monocytes Absolute 0.5  0.1 - 1.0 K/uL   Eosinophils Relative 3  0 - 5 %   Eosinophils Absolute 0.2  0.0 - 0.7 K/uL   Basophils Relative 1  0 - 1 %   Basophils Absolute 0.1  0.0 - 0.1 K/uL  COMPREHENSIVE METABOLIC PANEL      Result Value Range   Sodium 141  135 - 145 mEq/L   Potassium 3.2 (*) 3.5 - 5.1 mEq/L   Chloride 104  96 - 112 mEq/L   CO2 29  19 - 32 mEq/L   Glucose, Bld 127 (*) 70 - 99 mg/dL   BUN 12  6 - 23 mg/dL   Creatinine, Ser 7.82  0.50 - 1.10 mg/dL   Calcium 9.2  8.4 - 95.6 mg/dL   Total Protein 6.7  6.0 - 8.3 g/dL   Albumin 3.7  3.5 - 5.2 g/dL   AST 25  0 - 37 U/L   ALT 18  0 - 35 U/L   Alkaline Phosphatase 93  39 - 117 U/L   Total Bilirubin 1.4 (*) 0.3 - 1.2 mg/dL   GFR calc non Af Amer 88 (*) >90 mL/min   GFR calc Af Amer >90  >90 mL/min  LIPASE, BLOOD      Result Value Range    Lipase 31  11 - 59 U/L  URINALYSIS, ROUTINE W REFLEX MICROSCOPIC      Result Value Range   Color, Urine YELLOW  YELLOW   APPearance CLEAR  CLEAR   Specific Gravity, Urine 1.011  1.005 - 1.030   pH 5.5  5.0 - 8.0   Glucose, UA NEGATIVE  NEGATIVE mg/dL   Hgb urine dipstick NEGATIVE  NEGATIVE   Bilirubin Urine NEGATIVE  NEGATIVE   Ketones, ur NEGATIVE  NEGATIVE mg/dL   Protein, ur NEGATIVE  NEGATIVE mg/dL   Urobilinogen, UA 0.2  0.0 - 1.0 mg/dL   Nitrite NEGATIVE  NEGATIVE   Leukocytes, UA NEGATIVE  NEGATIVE  POCT I-STAT TROPONIN I  Result Value Range   Troponin i, poc 0.01  0.00 - 0.08 ng/mL   Comment 3             Imaging Review Ct Abdomen Pelvis W Contrast  10/03/2013   CLINICAL DATA:  62 year old with sudden onset of epigastric or right upper quadrant pain.  EXAM: CT ABDOMEN AND PELVIS WITH CONTRAST  TECHNIQUE: Multidetector CT imaging of the abdomen and pelvis was performed using the standard protocol following bolus administration of intravenous contrast.  CONTRAST:  OMNIPAQUE IOHEXOL 300 MG/ML  SOLN  COMPARISON:  Chest CT 01/01/2013  FINDINGS: Lung bases are clear. There is mild scarring or atelectasis along the posterior lower lobes bilaterally. Negative for free air.  Surgical changes in the stomach may represent a bypass procedure. subtle high density in the dependent aspect of the gallbladder could represent stones or sludge. Stable low-density structure along the hepatic dome, measures 0.8 cm, and likely an incidental finding. Portal venous system is patent. Normal appearance of the spleen, adrenal glands and both kidneys.  Atherosclerotic calcifications in the aorta without aneurysm. Limited evaluation of the uterus and adnexal structures without gross abnormality. There are scattered colonic diverticula. Normal appearance of the appendix which contains a small amount of oral contrast. No acute inflammatory changes within the abdomen or pelvis. No significant free fluid or  lymphadenopathy. Small bowel anastomosis in the left upper abdomen. No evidence to suggest a bowel obstruction. No acute bone abnormality.  IMPRESSION: No acute inflammatory changes within the abdomen or pelvis.  Question gallbladder stones or sludge. This could be further evaluated with ultrasound.  Postsurgical changes consistent with a gastric bypass procedure. No evidence for a bowel obstruction.  Colonic diverticulosis.   Electronically Signed   By: Richarda Overlie M.D.   On: 10/03/2013 19:15    EKG Interpretation    Date/Time:  Saturday October 03 2013 14:56:00 EST Ventricular Rate:  55 PR Interval:  158 QRS Duration: 90 QT Interval:  446 QTC Calculation: 426 R Axis:   16 Text Interpretation:  Sinus bradycardia Anterior infarct , age undetermined Abnormal ECG No significant change since last tracing Confirmed by Davionne Dowty  MD, Alaena Strader ([id number]) on 10/03/2013 3:10:00 PM Also confirmed by Deretha Emory  MD, Shantrice Rodenberg ([id number])  on 10/03/2013 3:46:16 PM            MDM   1. Epigastric pain    Patient to a gastric bypass procedure that was done in February. Patient was sudden onset about 3 hours ago with epigastric right upper quadrant pain was 10 out of 10 now of minimal maybe 2/10 associated with some nausea no vomiting. Pain is described as sharp located in those areas not radiating to the back. CT scan raises some concern for gallbladder stones or sludge labs are not consistent with acute cholecystitis. Will get ultrasound to further evaluate. If negative can be discharged to followup with her doctor if positive then would recommend referral to general surgery for consideration for gallbladder removal.  Workup without any acute changes on EKG also initial troponin was negative. Will order second troponin while ultrasound is pending. But clinically does not sound as if it's a cardiac chest pain. Mild elevation in bilirubin the liver function tests. This could be due to gastric bypass. Lipase is not  elevated not consistent with pancreatitis.   Shelda Jakes, MD 10/03/13 [phone]

## 2013-10-03 NOTE — ED Notes (Signed)
Patient transported to CT 

## 2013-10-03 NOTE — ED Provider Notes (Signed)
Ultrasound is unchanged from the ultrasound, that was performed one month ago.  She has sludge in the gallbladder, but no stones.  No gallbladder thickening.  No obstruction in the neck of the gallbladder You referred back to Dr. Daphine Deutscher for further evaluation Patient reports, that she again became nauseated after receiving the pain injection.  We will again give Zofran, and reevaluate prior to discharge Arman Filter, NP 10/03/13 2146  Arman Filter, NP 10/04/13 [phone]

## 2013-10-05 NOTE — ED Provider Notes (Signed)
Medical screening examination/treatment/procedure(s) were conducted as a shared visit with non-physician practitioner(s) and myself.  I personally evaluated the patient during the encounter.  EKG Interpretation    Date/Time:  Saturday October 03 2013 14:56:00 EST Ventricular Rate:  55 PR Interval:  158 QRS Duration: 90 QT Interval:  446 QTC Calculation: 426 R Axis:   16 Text Interpretation:  Sinus bradycardia Anterior infarct , age undetermined Abnormal ECG No significant change since last tracing Confirmed by Zanna Hawn  MD, Oliveah Zwack ([id number]) on 10/03/2013 3:10:00 PM Also confirmed by Deretha Emory  MD, Alima Naser [phone]  on 10/03/2013 3:46:16 PM           See previous note  Shelda Jakes, MD 10/05/13 [phone]

## 2013-11-02 ENCOUNTER — Ambulatory Visit (INDEPENDENT_AMBULATORY_CARE_PROVIDER_SITE_OTHER): Payer: Federal, State, Local not specified - PPO | Admitting: Physician Assistant

## 2013-11-02 ENCOUNTER — Encounter: Payer: Self-pay | Admitting: Physician Assistant

## 2013-11-02 VITALS — BP 108/70 | HR 68 | Temp 98.4°F | Resp 16 | Ht 64.0 in | Wt 166.0 lb

## 2013-11-02 DIAGNOSIS — J019 Acute sinusitis, unspecified: Secondary | ICD-10-CM

## 2013-11-02 MED ORDER — PREDNISONE 20 MG PO TABS: ORAL_TABLET | ORAL | Status: DC

## 2013-11-02 MED ORDER — AZITHROMYCIN 250 MG PO TABS: ORAL_TABLET | ORAL | Status: DC

## 2013-11-02 NOTE — Patient Instructions (Signed)
Please take the 2 zpaks Please take the prednisone to help decrease inflammation. Take it in the morning with food or it can cause GI upset or decrease sleep. It is not an antibiotic so you can stop it early if you are feeling better.   The majority of colds are caused by viruses and do not require antibiotics. Please read the rest of this hand out to learn more about the common cold and what you can do to help yourself as well as help prevent the over use of antibiotics.   COMMON COLD SIGNS AND SYMPTOMS - The common cold usually causes nasal congestion, runny nose, and sneezing. A sore throat may be present on the first day but usually resolves quickly. If a cough occurs, it generally develops on about the fourth or fifth day of symptoms, typically when congestion and runny nose are resolving  COMMON COLD COMPLICATIONS - In most cases, colds do not cause serious illness or complications. Most colds last for three to seven days, although many people continue to have symptoms (coughing, sneezing, congestion) for up to two weeks.  One of the more common complications is sinusitis, which is usually caused by viruses and rarely (about 2 percent of the time) by bacteria. Having thick or yellow to green-colored nasal discharge does not mean that bacterial sinusitis has developed; discolored nasal discharge is a normal phase of the common cold.  Lower respiratory infections, such as pneumonia or bronchitis, may develop following a cold.  Infection of the middle ear, or otitis media, can accompany or follow a cold.  COMMON COLD TREATMENT - There is no specific treatment for the viruses that cause the common cold. Most treatments are aimed at relieving some of the symptoms of the cold, but do not shorten or cure the cold. Antibiotics are not useful for treating the common cold; antibiotics are only used to treat illnesses caused by bacteria, not viruses. Unnecessary use of antibiotics for the treatment of the  common cold can cause allergic reactions, diarrhea, or other gastrointestinal symptoms in some patients.  The symptoms of a cold will resolve over time, even without any treatment. People with underlying medical conditions and those who use other over-the-counter or prescription medications should speak with their healthcare provider or pharmacist to ensure that it is safe to use these treatments. The following are treatments that may reduce the symptoms caused by the common cold.  Nasal congestion - Decongestants are good for nasal congestion- if you feel very stuffy but no mucus is coming out, this is the medication that will help you the most.  Pseudoephedrine is a decongestant that can improve nasal congestion. Although a prescription is not required, drugstores in the [place] keep pseudoephedrine behind the counter, so it must be requested from a pharmacist. If you have a heart condition or high blood pressure please use Coricidin BPH instead.   Runny nose - Antihistamines such as diphenhydramine (Benadryl), certazine (Zyrtec) which are best taking at night because they can make you tired OR loratadine (Claritin),  fexafinadine (Allegra) help with a runny nose.   Nasal sprays such an oxymetazoline (Afrin and others) may also give temporary relief of nasal congestion. However, these sprays should never be used for more than two to three days; use for more than three days use can worsen congestion.  Nasocort is now over the counter and can help decrease a runny nose. Please stop the medication if you have blurry vision or nose bleeds.   Sore  throat and headache - Sore throat and headache are best treated with a mild pain reliever such as acetaminophen (Tylenol) or a non-steroidal anti-inflammatory agent such as ibuprofen or naproxen (Motrin or Aleve). These medications should be taken with food to prevent stomach problems. As well as gargling with warm water and salt.   Cough - Common cough  medicine ingredients include guaifenesin and dextromethorphan; these are often combined with other medications in over-the-counter cold formulas. Often a cough is worse at night or first in the morning due to post nasal drip from you nose. You can try to sleep at an angle to decrease a cough.   Alternative treatments - Heated, humidified air can improve symptoms of nasal congestion and runny nose, and causes few to no side effects. A number of alternative products, including vitamin C, doubling up on your vitamin D and herbal products such as echinacea, may help. Certain products, such as nasal gels that contain zinc (eg, Zicam), have been associated with a permanent loss of smell.  Antibiotics - Antibiotics should not be used to treat an uncomplicated common cold. As noted above, colds are caused by viruses. Antibiotics treat bacterial, not viral infections. Some viruses that cause the common cold can also depress the immune system or cause swelling in the lining of the nose or airways; this can, in turn, lead to a bacterial infection. Often you need to give your body 7 days to fight off a common cold while treating the symptoms with the medications listed above. If after 7 days your symptoms are not improving, you are getting worse, you have shortness of breath, chest pain, a fever of over 103 you should seek medical help immediately.   PREVENTION IS THE BEST MEDICINE - Hand washing is an essential and highly effective way to prevent the spread of infection.  Alcohol-based hand rubs are a good alternative for disinfecting hands if a sink is not available.  Hands should be washed before preparing food and eating and after coughing, blowing the nose, or sneezing. While it is not always possible to limit contact with people who may be infected with a cold, touching the eyes, nose, or mouth after direct contact should be avoided when possible. Sneezing/coughing into the sleeve of one's clothing (at the inner  elbow) is another means of containing sprays of saliva and secretions and does not contaminate the hands.

## 2013-11-02 NOTE — Progress Notes (Signed)
Subjective:    Patient ID: Tracy Paul, female    DOB: 01-19-1951, 63 y.o.   MRN: [id number]  Sinus Problem This is a new problem. Episode onset: 2 weeks. The problem has been gradually worsening since onset. There has been no fever. Associated symptoms include congestion, coughing, headaches, sinus pressure, sneezing and a sore throat. Pertinent negatives include no chills, diaphoresis, ear pain, hoarse voice, neck pain, shortness of breath or swollen glands. Treatments tried: mucinex, dayquil. The treatment provided no relief.   Current Outpatient Prescriptions on File Prior to Visit  Medication Sig Dispense Refill  . acetaminophen (TYLENOL) 500 MG tablet Take 500 mg by mouth every 6 (six) hours as needed. For pain      . aspirin 81 MG tablet Take 81 mg by mouth daily.      . Biotin 10 MG CAPS Take 1 capsule by mouth daily.      . Calcium Citrate-Vitamin D (CALCIUM CITRATE + D3 PO) Take 500 mg by mouth 3 (three) times daily.      . Cholecalciferol (VITAMIN D3) 50000 UNITS CAPS Take 1 capsule by mouth. I take one by mouth on Sunday, Monday, Wednesday, Friday, Saturday      . diphenhydramine-acetaminophen (TYLENOL PM) 25-500 MG TABS Take 1 tablet by mouth at bedtime.       . fish oil-omega-3 fatty acids 1000 MG capsule Take 1 g by mouth daily.      . hydrochlorothiazide (HYDRODIURIL) 25 MG tablet Take 25 mg by mouth daily.       Marland Kitchen levothyroxine (SYNTHROID, LEVOTHROID) 100 MCG tablet Take 100 mcg by mouth daily before breakfast. M & Th take 100 mcg; All other days take 50 mcg.      . levothyroxine (SYNTHROID, LEVOTHROID) 75 MCG tablet Take 75 mcg by mouth daily with breakfast.       . oxyCODONE-acetaminophen (PERCOCET/ROXICET) 5-325 MG per tablet Take 1 tablet by mouth every 4 (four) hours as needed for moderate pain or severe pain.      . potassium gluconate 595 MG TABS Take 595 mg by mouth daily.       . pravastatin (PRAVACHOL) 40 MG tablet Take 20 mg by mouth every other day.       .  vitamin B-12 (CYANOCOBALAMIN) 1000 MCG tablet Take 1,000 mcg by mouth 2 (two) times a week.        No current facility-administered medications on file prior to visit.   Past Medical History  Diagnosis Date  . Hypertension   . Hyperlipidemia   . Hypothyroidism   . Thyroid nodule   . GERD (gastroesophageal reflux disease)   . Chest pain     History of normal cardiac catheterization x2; last heart catheter 2000; echocardiogram November 2009: EF 60%; LAE;    Myoview 3/12: EF 60%, no ischemia  . Allergic rhinitis   . Chronic headache   . Morbid obesity   . PONV (postoperative nausea and vomiting)     Review of Systems  Constitutional: Positive for fatigue. Negative for fever, chills and diaphoresis.  HENT: Positive for congestion, dental problem, sinus pressure, sneezing and sore throat. Negative for ear pain, hoarse voice, postnasal drip, rhinorrhea, trouble swallowing and voice change.   Eyes: Negative.   Respiratory: Positive for cough. Negative for chest tightness, shortness of breath, wheezing and stridor.   Cardiovascular: Negative.   Gastrointestinal: Negative.   Musculoskeletal: Negative.  Negative for neck pain.  Neurological: Positive for dizziness and headaches. Negative for tremors,  facial asymmetry, speech difficulty, weakness, light-headedness and numbness.       Objective:   Physical Exam  Constitutional: She appears well-developed and well-nourished.  HENT:  Head: Normocephalic and atraumatic.  Right Ear: External ear normal.  Nose: Right sinus exhibits frontal sinus tenderness. Left sinus exhibits frontal sinus tenderness.  Eyes: Conjunctivae and EOM are normal.  Neck: Normal range of motion. Neck supple.  Cardiovascular: Normal rate, regular rhythm, normal heart sounds and intact distal pulses.   Pulmonary/Chest: Effort normal and breath sounds normal. No respiratory distress. She has no wheezes.  Abdominal: Soft. Bowel sounds are normal.  Lymphadenopathy:     She has cervical adenopathy.  Skin: Skin is warm and dry.       Assessment & Plan:  Acute sinusitis, unspecified - Plan: azithromycin (ZITHROMAX) 250 MG tablet, predniSONE (DELTASONE) 20 MG tablet

## 2013-11-06 ENCOUNTER — Encounter (INDEPENDENT_AMBULATORY_CARE_PROVIDER_SITE_OTHER): Payer: Self-pay

## 2013-11-06 ENCOUNTER — Ambulatory Visit (INDEPENDENT_AMBULATORY_CARE_PROVIDER_SITE_OTHER): Payer: Federal, State, Local not specified - PPO | Admitting: Surgery

## 2013-11-06 VITALS — BP 130/70 | HR 68 | Temp 97.8°F | Resp 18 | Ht 63.0 in | Wt 166.0 lb

## 2013-11-06 DIAGNOSIS — K8020 Calculus of gallbladder without cholecystitis without obstruction: Secondary | ICD-10-CM

## 2013-11-06 NOTE — Patient Instructions (Signed)

## 2013-11-06 NOTE — Progress Notes (Signed)
Chief Complaint:  Recurrent right upper quadrant pain  History of Present Illness:  Tracy Paul is an 63 y.o. female who had an attack of right upper quadrant pain on December 6 that didn't go away. She went to the emergency room where an ultrasound showed multiple shadows consistent with gallstones but no gallbladder wall thickening. Her pain abated with pain meds. Since that time she's had recurrent bouts of right upper quadrant pain. She denies any nausea vomiting with this. I suspect that this is her gallbladder. Would plan to do an endoscopic cholecystectomy also look at her small bowel to make sure she didn't have an internal hernia.  I discussed laparoscopic cholecystectomy with her husband gave her a booklet on this and the size and the risk not limited to common duct injury bowel injury bile leaks.  Past Medical History  Diagnosis Date  . Hypertension   . Hyperlipidemia   . Hypothyroidism   . Thyroid nodule   . GERD (gastroesophageal reflux disease)   . Chest pain     History of normal cardiac catheterization x2; last heart catheter 2000; echocardiogram November 2009: EF 60%; LAE;    Myoview 3/12: EF 60%, no ischemia  . Allergic rhinitis   . Chronic headache   . Morbid obesity   . PONV (postoperative nausea and vomiting)     Past Surgical History  Procedure Laterality Date  . Bilateral carpal tunnel surgery    . Replacement total knee bilateral      bilat  . Nasal sinus surgery    . Tubal ligation    . Breath tek h pylori  07/01/2012    Procedure: BREATH TEK H PYLORI;  Surgeon: Edward Jolly, MD;  Location: [facility];  Service: General;  Laterality: N/A;  . 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 acetaminophen (TYLENOL) 500  MG tablet Take 500 mg by mouth every 6 (six) hours as needed. For pain      . aspirin 81 MG tablet Take 81 mg by mouth daily.      Marland Kitchen azithromycin (ZITHROMAX) 250 MG tablet Two tablets day one, then one tablet daily next 4 days.  6 tablet  1  . Biotin 10 MG CAPS Take 1 capsule by mouth daily.      . Calcium Citrate-Vitamin D (CALCIUM CITRATE + D3 PO) Take 500 mg by mouth 3 (three) times daily.      . Cholecalciferol (VITAMIN D3) 50000 UNITS CAPS Take 1 capsule by mouth. I take one by mouth on Sunday, Monday, Wednesday, Friday, Saturday      . diphenhydramine-acetaminophen (TYLENOL PM) 25-500 MG TABS Take 1 tablet by mouth at bedtime.       . fish oil-omega-3 fatty acids 1000 MG capsule Take 1 g by mouth daily.      . hydrochlorothiazide (HYDRODIURIL) 25 MG tablet Take 25 mg by mouth daily.       Marland Kitchen levothyroxine (SYNTHROID, LEVOTHROID) 100 MCG tablet Take 100 mcg by mouth daily before breakfast. M & Th take 100 mcg; All other days take 50 mcg.      . levothyroxine (SYNTHROID, LEVOTHROID) 75 MCG tablet Take 75 mcg by mouth daily with breakfast.       . oxyCODONE-acetaminophen (PERCOCET/ROXICET) 5-325 MG per tablet  Take 1 tablet by mouth every 4 (four) hours as needed for moderate pain or severe pain.      . potassium gluconate 595 MG TABS Take 595 mg by mouth daily.       . pravastatin (PRAVACHOL) 40 MG tablet Take 20 mg by mouth every other day.       . predniSONE (DELTASONE) 20 MG tablet Take one pill two times daily for 3 days, take one pill daily for 4 days.  10 tablet  0  . vitamin B-12 (CYANOCOBALAMIN) 1000 MCG tablet Take 1,000 mcg by mouth 2 (two) times a week.        No current facility-administered medications for this visit.   Codeine Family History  Problem Relation Age of Onset  . Breast cancer Mother   . Cancer Mother     breast cancer  . Anuerysm Mother     brain anuerysm  . Heart attack Father   . Heart disease Father   . Heart attack Sister    Social History:   reports  that she quit smoking about 34 years ago. Her smoking use included Cigarettes. She has a 8 pack-year smoking history. She does not have any smokeless tobacco history on file. She reports that she does not drink alcohol or use illicit drugs.   REVIEW OF SYSTEMS - PERTINENT POSITIVES ONLY: No change  Physical Exam:   Blood pressure 130/70, pulse 68, temperature 97.8 F (36.6 C), resp. rate 18, height 5' 3" (1.6 m), weight 166 lb (75.297 kg). Body mass index is 29.41 kg/(m^2).  Gen:  WDWN WF NAD  Neurological: Alert and oriented to person, place, and time. Motor and sensory function is grossly intact  Head: Normocephalic and atraumatic.  Eyes: Conjunctivae are normal. Pupils are equal, round, and reactive to light. No scleral icterus.  Neck: Normal range of motion. Neck supple. No tracheal deviation or thyromegaly present.  Cardiovascular:  SR without murmurs or gallops.  No carotid bruits Respiratory: Effort normal.  No respiratory distress. No chest wall tenderness. Breath sounds normal.  No wheezes, rales or rhonchi.  Abdomen:  No acute right upper quadrant pain GU: Musculoskeletal: Normal range of motion. Extremities are nontender. No cyanosis, edema or clubbing noted Lymphadenopathy: No cervical, preauricular, postauricular or axillary adenopathy is present Skin: Skin is warm and dry. No rash noted. No diaphoresis. No erythema. No pallor. Pscyh: Normal mood and affect. Behavior is normal. Judgment and thought content normal.   LABORATORY RESULTS: No results found for this or any previous visit (from the past 48 hour(s)).  RADIOLOGY RESULTS: No results found.  Problem List: Patient Active Problem List   Diagnosis Date Noted  . 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 Obesity 48 06/18/2012  . Dyspnea 01/26/2011  . Dizziness 01/10/2011  . Palpitations 01/10/2011  . DIZZINESS 01/10/2011  . CHEST PAIN UNSPECIFIED 01/10/2011  . Hypertension   . Hyperlipidemia   .  Hypothyroidism   . Chest pain   . GERD (gastroesophageal reflux disease)   . HYPERTENSION 09/26/2010  . ABNORMAL HEART RHYTHMS 09/26/2010  . 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 ALLERGIC RHINITIS 09/26/2010  . HEADACHE, CHRONIC 09/26/2010    Assessment & Plan: Probable chronic cholecystitis.   Plan lap chole IOC    Matt B. Yamato Kopf, MD, FACS  [facility] [phone] beeper [phone]  11/06/2013 9:55 AM     

## 2013-11-10 ENCOUNTER — Other Ambulatory Visit: Payer: Self-pay | Admitting: Internal Medicine

## 2013-11-10 ENCOUNTER — Other Ambulatory Visit: Payer: Self-pay | Admitting: Physician Assistant

## 2013-11-24 ENCOUNTER — Encounter (HOSPITAL_COMMUNITY): Payer: Self-pay | Admitting: Pharmacy Technician

## 2013-11-24 NOTE — Patient Instructions (Addendum)
Tracy Paul  11/24/2013                           YOUR PROCEDURE IS SCHEDULED ON: 11/27/13               PLEASE REPORT TO SHORT STAY CENTER AT : 7:30 am               CALL THIS NUMBER IF ANY PROBLEMS THE DAY OF SURGERY :               [phone]                      REMEMBER:   Do not eat food or drink liquids AFTER MIDNIGHT   Take these medicines the morning of surgery with A SIP OF WATER: LEVOTHYROXINE / MAY TAKE OXYCODONE IF NEEDED   Do not wear jewelry, make-up   Do not wear lotions, powders, or perfumes.   Do not shave legs or underarms 12 hrs. before surgery (men may shave face)  Do not bring valuables to the hospital.  Contacts, dentures or bridgework may not be worn into surgery.  Leave suitcase in the car. After surgery it may be brought to your room.  For patients admitted to the hospital more than one night, checkout time is 11:00                          The day of discharge.   Patients discharged the day of surgery will not be allowed to drive home                             If going home same day of surgery, must have someone stay with you first                           24 hrs at home and arrange for some one to drive you home from hospital.    Special Instructions:   Please read over the following fact sheets that you were given:               1. USE FLEET ENEMA THE NIGHT BEFORE SURGERY                                 2. [place]                                                X_____________________________________________________________________        Failure to follow these instructions may result in cancellation of your surgery

## 2013-11-25 ENCOUNTER — Encounter (INDEPENDENT_AMBULATORY_CARE_PROVIDER_SITE_OTHER): Payer: Self-pay

## 2013-11-25 ENCOUNTER — Encounter (HOSPITAL_COMMUNITY)
Admission: RE | Admit: 2013-11-25 | Discharge: 2013-11-25 | Disposition: A | Discharge status: Home / Self Care | Payer: Federal, State, Local not specified - PPO | Source: Ambulatory Visit | Attending: Surgery | Admitting: Surgery

## 2013-11-25 ENCOUNTER — Encounter (HOSPITAL_COMMUNITY): Payer: Self-pay

## 2013-11-25 HISTORY — DX: Cardiac arrhythmia, unspecified: I49.9

## 2013-11-25 HISTORY — DX: Chronic cholecystitis: K81.1

## 2013-11-25 HISTORY — DX: Unspecified osteoarthritis, unspecified site: M19.90

## 2013-11-25 LAB — COMPREHENSIVE METABOLIC PANEL
ALT: 19 U/L (ref 0–35)
AST: 18 U/L (ref 0–37)
Albumin: 3.9 g/dL (ref 3.5–5.2)
Alkaline Phosphatase: 86 U/L (ref 39–117)
BUN: 13 mg/dL (ref 6–23)
CALCIUM: 9.7 mg/dL (ref 8.4–10.5)
CO2: 30 mEq/L (ref 19–32)
Chloride: 101 mEq/L (ref 96–112)
Creatinine, Ser: 0.87 mg/dL (ref 0.50–1.10)
GFR calc Af Amer: 82 mL/min — ABNORMAL LOW (ref >90)
GFR calc non Af Amer: 70 mL/min — ABNORMAL LOW (ref >90)
Glucose, Bld: 87 mg/dL (ref 70–99)
Potassium: 4.7 mEq/L (ref 3.7–5.3)
SODIUM: 141 meq/L (ref 137–147)
TOTAL PROTEIN: 7.1 g/dL (ref 6.0–8.3)
Total Bilirubin: 1.8 mg/dL — ABNORMAL HIGH (ref 0.3–1.2)

## 2013-11-25 LAB — CBC
HCT: 39.4 % (ref 36.0–46.0)
HEMOGLOBIN: 13.1 g/dL (ref 12.0–15.0)
MCH: 30.8 pg (ref 26.0–34.0)
MCHC: 33.2 g/dL (ref 30.0–36.0)
MCV: 92.5 fL (ref 78.0–100.0)
Platelets: 239 10*3/uL (ref 150–400)
RBC: 4.26 MIL/uL (ref 3.87–5.11)
RDW: 13.5 % (ref 11.5–15.5)
WBC: 5.2 10*3/uL (ref 4.0–10.5)

## 2013-11-27 ENCOUNTER — Encounter (HOSPITAL_COMMUNITY): Payer: Federal, State, Local not specified - PPO | Admitting: Anesthesiology

## 2013-11-27 ENCOUNTER — Ambulatory Visit (HOSPITAL_COMMUNITY): Payer: Federal, State, Local not specified - PPO | Admitting: Anesthesiology

## 2013-11-27 ENCOUNTER — Encounter (HOSPITAL_COMMUNITY): Payer: Self-pay | Admitting: *Deleted

## 2013-11-27 ENCOUNTER — Observation Stay (HOSPITAL_COMMUNITY)
Admission: RE | Admit: 2013-11-27 | Discharge: 2013-11-28 | Disposition: A | Discharge status: Home / Self Care | Payer: Federal, State, Local not specified - PPO | Source: Ambulatory Visit | Attending: Surgery | Admitting: Surgery

## 2013-11-27 ENCOUNTER — Ambulatory Visit (HOSPITAL_COMMUNITY): Payer: Federal, State, Local not specified - PPO

## 2013-11-27 ENCOUNTER — Encounter (HOSPITAL_COMMUNITY)
Admission: RE | Disposition: A | Discharge status: Home / Self Care | Payer: Self-pay | Source: Ambulatory Visit | Attending: Surgery

## 2013-11-27 DIAGNOSIS — K219 Gastro-esophageal reflux disease without esophagitis: Secondary | ICD-10-CM | POA: Insufficient documentation

## 2013-11-27 DIAGNOSIS — E785 Hyperlipidemia, unspecified: Secondary | ICD-10-CM | POA: Insufficient documentation

## 2013-11-27 DIAGNOSIS — Z79899 Other long term (current) drug therapy: Secondary | ICD-10-CM | POA: Insufficient documentation

## 2013-11-27 DIAGNOSIS — E039 Hypothyroidism, unspecified: Secondary | ICD-10-CM | POA: Insufficient documentation

## 2013-11-27 DIAGNOSIS — Z87891 Personal history of nicotine dependence: Secondary | ICD-10-CM | POA: Insufficient documentation

## 2013-11-27 DIAGNOSIS — K8020 Calculus of gallbladder without cholecystitis without obstruction: Secondary | ICD-10-CM

## 2013-11-27 DIAGNOSIS — K811 Chronic cholecystitis: Principal | ICD-10-CM | POA: Insufficient documentation

## 2013-11-27 DIAGNOSIS — R1011 Right upper quadrant pain: Secondary | ICD-10-CM | POA: Insufficient documentation

## 2013-11-27 DIAGNOSIS — K810 Acute cholecystitis: Secondary | ICD-10-CM

## 2013-11-27 DIAGNOSIS — Z7982 Long term (current) use of aspirin: Secondary | ICD-10-CM | POA: Insufficient documentation

## 2013-11-27 DIAGNOSIS — I10 Essential (primary) hypertension: Secondary | ICD-10-CM | POA: Insufficient documentation

## 2013-11-27 HISTORY — PX: CHOLECYSTECTOMY: SHX55

## 2013-11-27 LAB — CREATININE, SERUM
CREATININE: 0.86 mg/dL (ref 0.50–1.10)
GFR calc Af Amer: 83 mL/min — ABNORMAL LOW (ref >90)
GFR calc non Af Amer: 71 mL/min — ABNORMAL LOW (ref >90)

## 2013-11-27 LAB — CBC
HCT: 35.2 % — ABNORMAL LOW (ref 36.0–46.0)
HEMOGLOBIN: 11.8 g/dL — AB (ref 12.0–15.0)
MCH: 30.8 pg (ref 26.0–34.0)
MCHC: 33.5 g/dL (ref 30.0–36.0)
MCV: 91.9 fL (ref 78.0–100.0)
Platelets: 225 10*3/uL (ref 150–400)
RBC: 3.83 MIL/uL — ABNORMAL LOW (ref 3.87–5.11)
RDW: 13.8 % (ref 11.5–15.5)
WBC: 9.5 10*3/uL (ref 4.0–10.5)

## 2013-11-27 SURGERY — LAPAROSCOPIC CHOLECYSTECTOMY WITH INTRAOPERATIVE CHOLANGIOGRAM
Anesthesia: General | Site: Abdomen

## 2013-11-27 MED ORDER — HEPARIN SODIUM (PORCINE) 5000 UNIT/ML IJ SOLN
5000.0000 [IU] | Freq: Three times a day (TID) | INTRAMUSCULAR | Status: DC
  Administered 2013-11-27 – 2013-11-28 (×2): 5000 [IU] via SUBCUTANEOUS
  Filled 2013-11-27 (×6): qty 1

## 2013-11-27 MED ORDER — ONDANSETRON HCL 4 MG/2ML IJ SOLN: 4.0000 mg | Freq: Four times a day (QID) | INTRAMUSCULAR | Status: DC | PRN

## 2013-11-27 MED ORDER — SCOPOLAMINE 1 MG/3DAYS TD PT72
MEDICATED_PATCH | TRANSDERMAL | Status: DC | PRN
  Administered 2013-11-27: 1 via TRANSDERMAL

## 2013-11-27 MED ORDER — BUPIVACAINE LIPOSOME 1.3 % IJ SUSP
20.0000 mL | Freq: Once | INTRAMUSCULAR | Status: DC
  Filled 2013-11-27: qty 20

## 2013-11-27 MED ORDER — MIDAZOLAM HCL 2 MG/2ML IJ SOLN
INTRAMUSCULAR | Status: AC
  Filled 2013-11-27: qty 2

## 2013-11-27 MED ORDER — METOCLOPRAMIDE HCL 5 MG/ML IJ SOLN
INTRAMUSCULAR | Status: DC | PRN
  Administered 2013-11-27 (×2): 5 mg via INTRAVENOUS

## 2013-11-27 MED ORDER — PROPOFOL 10 MG/ML IV BOLUS
INTRAVENOUS | Status: AC
  Filled 2013-11-27: qty 20

## 2013-11-27 MED ORDER — PHENYLEPHRINE HCL 10 MG/ML IJ SOLN
INTRAMUSCULAR | Status: DC | PRN
  Administered 2013-11-27: 80 ug via INTRAVENOUS

## 2013-11-27 MED ORDER — PROPOFOL 10 MG/ML IV BOLUS
INTRAVENOUS | Status: DC | PRN
  Administered 2013-11-27: 50 mg via INTRAVENOUS
  Administered 2013-11-27: 150 mg via INTRAVENOUS

## 2013-11-27 MED ORDER — EPHEDRINE SULFATE 50 MG/ML IJ SOLN
INTRAMUSCULAR | Status: DC | PRN
  Administered 2013-11-27 (×2): 10 mg via INTRAVENOUS

## 2013-11-27 MED ORDER — KCL IN DEXTROSE-NACL 20-5-0.45 MEQ/L-%-% IV SOLN
INTRAVENOUS | Status: DC
  Administered 2013-11-27 – 2013-11-28 (×2): via INTRAVENOUS
  Filled 2013-11-27 (×2): qty 1000

## 2013-11-27 MED ORDER — CEFAZOLIN SODIUM-DEXTROSE 2-3 GM-% IV SOLR
2.0000 g | INTRAVENOUS | Status: AC
  Administered 2013-11-27: 2 g via INTRAVENOUS

## 2013-11-27 MED ORDER — ACETAMINOPHEN 325 MG PO TABS: 650.0000 mg | ORAL_TABLET | ORAL | Status: DC | PRN

## 2013-11-27 MED ORDER — HEPARIN SODIUM (PORCINE) 5000 UNIT/ML IJ SOLN
5000.0000 [IU] | Freq: Once | INTRAMUSCULAR | Status: AC
  Administered 2013-11-27: 5000 [IU] via SUBCUTANEOUS
  Filled 2013-11-27: qty 1

## 2013-11-27 MED ORDER — BUPIVACAINE-EPINEPHRINE PF 0.25-1:200000 % IJ SOLN
INTRAMUSCULAR | Status: AC
  Filled 2013-11-27: qty 30

## 2013-11-27 MED ORDER — SCOPOLAMINE 1 MG/3DAYS TD PT72
MEDICATED_PATCH | TRANSDERMAL | Status: AC
  Filled 2013-11-27: qty 1

## 2013-11-27 MED ORDER — CEFAZOLIN SODIUM-DEXTROSE 2-3 GM-% IV SOLR
INTRAVENOUS | Status: AC
  Filled 2013-11-27: qty 50

## 2013-11-27 MED ORDER — FENTANYL CITRATE 0.05 MG/ML IJ SOLN
INTRAMUSCULAR | Status: AC
  Filled 2013-11-27: qty 5

## 2013-11-27 MED ORDER — GLYCOPYRROLATE 0.2 MG/ML IJ SOLN
INTRAMUSCULAR | Status: DC | PRN
  Administered 2013-11-27: 0.6 mg via INTRAVENOUS
  Administered 2013-11-27: 0.2 mg via INTRAVENOUS

## 2013-11-27 MED ORDER — NEOSTIGMINE METHYLSULFATE 1 MG/ML IJ SOLN
INTRAMUSCULAR | Status: DC | PRN
  Administered 2013-11-27: 5 mg via INTRAVENOUS

## 2013-11-27 MED ORDER — HYDROMORPHONE HCL PF 1 MG/ML IJ SOLN
INTRAMUSCULAR | Status: AC
  Filled 2013-11-27: qty 1

## 2013-11-27 MED ORDER — MEPERIDINE HCL 50 MG/ML IJ SOLN: 6.2500 mg | INTRAMUSCULAR | Status: DC | PRN

## 2013-11-27 MED ORDER — SODIUM CHLORIDE 0.9 % IJ SOLN
INTRAMUSCULAR | Status: DC | PRN
  Administered 2013-11-27: 12:00:00

## 2013-11-27 MED ORDER — OXYCODONE HCL 5 MG PO TABS: 5.0000 mg | ORAL_TABLET | Freq: Once | ORAL | Status: DC | PRN

## 2013-11-27 MED ORDER — PHENYLEPHRINE 40 MCG/ML (10ML) SYRINGE FOR IV PUSH (FOR BLOOD PRESSURE SUPPORT)
PREFILLED_SYRINGE | INTRAVENOUS | Status: AC
  Filled 2013-11-27: qty 10

## 2013-11-27 MED ORDER — ROCURONIUM BROMIDE 100 MG/10ML IV SOLN
INTRAVENOUS | Status: DC | PRN
  Administered 2013-11-27: 20 mg via INTRAVENOUS
  Administered 2013-11-27 (×2): 10 mg via INTRAVENOUS
  Administered 2013-11-27: 40 mg via INTRAVENOUS

## 2013-11-27 MED ORDER — 0.9 % SODIUM CHLORIDE (POUR BTL) OPTIME
TOPICAL | Status: DC | PRN
  Administered 2013-11-27: 1000 mL

## 2013-11-27 MED ORDER — PROMETHAZINE HCL 25 MG/ML IJ SOLN: 6.2500 mg | INTRAMUSCULAR | Status: DC | PRN

## 2013-11-27 MED ORDER — MORPHINE SULFATE 2 MG/ML IJ SOLN: 1.0000 mg | INTRAMUSCULAR | Status: DC | PRN

## 2013-11-27 MED ORDER — MIDAZOLAM HCL 5 MG/5ML IJ SOLN
INTRAMUSCULAR | Status: DC | PRN
  Administered 2013-11-27: 2 mg via INTRAVENOUS

## 2013-11-27 MED ORDER — ONDANSETRON HCL 4 MG/2ML IJ SOLN
INTRAMUSCULAR | Status: DC | PRN
  Administered 2013-11-27: 4 mg via INTRAVENOUS

## 2013-11-27 MED ORDER — OXYCODONE HCL 5 MG/5ML PO SOLN
5.0000 mg | Freq: Once | ORAL | Status: DC | PRN
  Filled 2013-11-27: qty 5

## 2013-11-27 MED ORDER — FENTANYL CITRATE 0.05 MG/ML IJ SOLN
INTRAMUSCULAR | Status: DC | PRN
  Administered 2013-11-27: 100 ug via INTRAVENOUS
  Administered 2013-11-27 (×2): 50 ug via INTRAVENOUS
  Administered 2013-11-27: 100 ug via INTRAVENOUS

## 2013-11-27 MED ORDER — FENTANYL CITRATE 0.05 MG/ML IJ SOLN
INTRAMUSCULAR | Status: AC
  Filled 2013-11-27: qty 2

## 2013-11-27 MED ORDER — CHLORHEXIDINE GLUCONATE 4 % EX LIQD: 1.0000 "application " | Freq: Once | CUTANEOUS | Status: DC

## 2013-11-27 MED ORDER — BUPIVACAINE-EPINEPHRINE 0.25% -1:200000 IJ SOLN
INTRAMUSCULAR | Status: DC | PRN
  Administered 2013-11-27: 20 mL

## 2013-11-27 MED ORDER — GLYCOPYRROLATE 0.2 MG/ML IJ SOLN
INTRAMUSCULAR | Status: AC
  Filled 2013-11-27: qty 3

## 2013-11-27 MED ORDER — HYDROMORPHONE HCL PF 1 MG/ML IJ SOLN
0.2500 mg | INTRAMUSCULAR | Status: DC | PRN
  Administered 2013-11-27: 0.5 mg via INTRAVENOUS

## 2013-11-27 MED ORDER — LACTATED RINGERS IR SOLN
Status: DC | PRN
  Administered 2013-11-27: 1000 mL

## 2013-11-27 MED ORDER — ONDANSETRON HCL 4 MG PO TABS: 4.0000 mg | ORAL_TABLET | Freq: Four times a day (QID) | ORAL | Status: DC | PRN

## 2013-11-27 MED ORDER — ROCURONIUM BROMIDE 100 MG/10ML IV SOLN
INTRAVENOUS | Status: AC
  Filled 2013-11-27: qty 1

## 2013-11-27 MED ORDER — OXYCODONE-ACETAMINOPHEN 5-325 MG PO TABS
1.0000 | ORAL_TABLET | ORAL | Status: DC | PRN
  Administered 2013-11-27: 2 via ORAL
  Filled 2013-11-27: qty 2

## 2013-11-27 MED ORDER — LACTATED RINGERS IV SOLN
INTRAVENOUS | Status: DC
  Administered 2013-11-27 (×3): via INTRAVENOUS

## 2013-11-27 SURGICAL SUPPLY — 36 items
APPLIER CLIP ROT 10 11.4 M/L (STAPLE) ×3
BENZOIN TINCTURE PRP APPL 2/3 (GAUZE/BANDAGES/DRESSINGS) IMPLANT
CABLE HIGH FREQUENCY MONO STRZ (ELECTRODE) IMPLANT
CANISTER SUCTION 2500CC (MISCELLANEOUS) ×3 IMPLANT
CATH REDDICK CHOLANGI 4FR 50CM (CATHETERS) ×3 IMPLANT
CLIP APPLIE ROT 10 11.4 M/L (STAPLE) ×1 IMPLANT
CLOSURE WOUND 1/2 X4 (GAUZE/BANDAGES/DRESSINGS)
COVER MAYO STAND STRL (DRAPES) ×3 IMPLANT
DECANTER SPIKE VIAL GLASS SM (MISCELLANEOUS) IMPLANT
DERMABOND ADVANCED (GAUZE/BANDAGES/DRESSINGS) ×2
DERMABOND ADVANCED .7 DNX12 (GAUZE/BANDAGES/DRESSINGS) ×1 IMPLANT
DRAPE C-ARM 42X120 X-RAY (DRAPES) ×3 IMPLANT
DRAPE LAPAROSCOPIC ABDOMINAL (DRAPES) ×3 IMPLANT
ELECT REM PT RETURN 9FT ADLT (ELECTROSURGICAL) ×3
ELECTRODE REM PT RTRN 9FT ADLT (ELECTROSURGICAL) ×1 IMPLANT
GLOVE BIOGEL M 8.0 STRL (GLOVE) ×3 IMPLANT
GOWN STRL REUS W/TWL XL LVL3 (GOWN DISPOSABLE) ×9 IMPLANT
HEMOSTAT SURGICEL 4X8 (HEMOSTASIS) IMPLANT
IV CATH 14GX2 1/4 (CATHETERS) ×3 IMPLANT
KIT BASIN OR (CUSTOM PROCEDURE TRAY) ×3 IMPLANT
NS IRRIG 1000ML POUR BTL (IV SOLUTION) ×3 IMPLANT
POUCH SPECIMEN RETRIEVAL 10MM (ENDOMECHANICALS) ×3 IMPLANT
SCISSORS LAP 5X45 EPIX DISP (ENDOMECHANICALS) ×3 IMPLANT
SET IRRIG TUBING LAPAROSCOPIC (IRRIGATION / IRRIGATOR) ×3 IMPLANT
SLEEVE XCEL OPT CAN 5 100 (ENDOMECHANICALS) ×6 IMPLANT
SOLUTION ANTI FOG 6CC (MISCELLANEOUS) ×3 IMPLANT
STRIP CLOSURE SKIN 1/2X4 (GAUZE/BANDAGES/DRESSINGS) IMPLANT
SUT VIC AB 4-0 SH 18 (SUTURE) ×3 IMPLANT
SYR 20CC LL (SYRINGE) ×3 IMPLANT
TOWEL OR 17X26 10 PK STRL BLUE (TOWEL DISPOSABLE) ×3 IMPLANT
TOWEL OR NON WOVEN STRL DISP B (DISPOSABLE) ×3 IMPLANT
TRAY LAP CHOLE (CUSTOM PROCEDURE TRAY) ×3 IMPLANT
TROCAR BLADELESS OPT 5 100 (ENDOMECHANICALS) ×9 IMPLANT
TROCAR XCEL BLUNT TIP 100MML (ENDOMECHANICALS) IMPLANT
TROCAR XCEL NON-BLD 11X100MML (ENDOMECHANICALS) ×3 IMPLANT
TUBING INSUFFLATION 10FT LAP (TUBING) ×3 IMPLANT

## 2013-11-27 NOTE — Transfer of Care (Signed)
Immediate Anesthesia Transfer of Care Note  Patient: Tracy Paul  Procedure(s) Performed: Procedure(s): LAPAROSCOPIC CHOLECYSTECTOMY WITH INTRAOPERATIVE CHOLANGIOGRAM (N/A)  Patient Location: PACU  Anesthesia Type:General  Level of Consciousness: Patient easily awoken, sedated, comfortable, cooperative, following commands, responds to stimulation.   Airway & Oxygen Therapy: Patient spontaneously breathing, ventilating well, oxygen via simple oxygen mask.  Post-op Assessment: Report given to PACU RN, vital signs reviewed and stable, moving all extremities.   Post vital signs: Reviewed and stable.  Complications: No apparent anesthesia complications

## 2013-11-27 NOTE — Anesthesia Postprocedure Evaluation (Signed)
Anesthesia Post Note  Patient: Tracy Paul  Procedure(s) Performed: Procedure(s) (LRB): LAPAROSCOPIC CHOLECYSTECTOMY WITH INTRAOPERATIVE CHOLANGIOGRAM (N/A)  Anesthesia type: General  Patient location: PACU  Post pain: Pain level controlled  Post assessment: Post-op Vital signs reviewed  Last Vitals: BP 108/57  Pulse 64  Temp(Src) 36.6 C (Oral)  Resp 16  SpO2 99%  Post vital signs: Reviewed  Level of consciousness: sedated  Complications: No apparent anesthesia complications

## 2013-11-27 NOTE — Interval H&P Note (Signed)
History and Physical Interval Note:  11/27/2013 10:12 AM  Tracy Paul  has presented today for surgery, with the diagnosis of chronic cholelithiasis  The various methods of treatment have been discussed with the patient and family. After consideration of risks, benefits and other options for treatment, the patient has consented to  Procedure(s): LAPAROSCOPIC CHOLECYSTECTOMY WITH INTRAOPERATIVE CHOLANGIOGRAM (N/A) as a surgical intervention .  The patient's history has been reviewed, patient examined, no change in status, stable for surgery.  I have reviewed the patient's chart and labs.  Questions were answered to the patient's satisfaction.     Lenon Kuennen B

## 2013-11-27 NOTE — H&P (View-Only) (Signed)
Chief Complaint:  Recurrent right upper quadrant pain  History of Present Illness:  Tracy Paul is an 63 y.o. female who had an attack of right upper quadrant pain on December 6 that didn't go away. She went to the emergency room where an ultrasound showed multiple shadows consistent with gallstones but no gallbladder wall thickening. Her pain abated with pain meds. Since that time she's had recurrent bouts of right upper quadrant pain. She denies any nausea vomiting with this. I suspect that this is her gallbladder. Would plan to do an endoscopic cholecystectomy also look at her small bowel to make sure she didn't have an internal hernia.  I discussed laparoscopic cholecystectomy with her husband gave her a booklet on this and the size and the risk not limited to common duct injury bowel injury bile leaks.  Past Medical History  Diagnosis Date  . Hypertension   . Hyperlipidemia   . Hypothyroidism   . Thyroid nodule   . GERD (gastroesophageal reflux disease)   . Chest pain     History of normal cardiac catheterization x2; last heart catheter 2000; echocardiogram November 2009: EF 60%; LAE;    Myoview 3/12: EF 60%, no ischemia  . Allergic rhinitis   . Chronic headache   . Morbid obesity   . PONV (postoperative nausea and vomiting)     Past Surgical History  Procedure Laterality Date  . Bilateral carpal tunnel surgery    . Replacement total knee bilateral      bilat  . Nasal sinus surgery    . Tubal ligation    . Breath tek h pylori  07/01/2012    Procedure: BREATH TEK H PYLORI;  Surgeon: Edward Jolly, MD;  Location: [facility];  Service: General;  Laterality: N/A;  . 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 acetaminophen (TYLENOL) 500  MG tablet Take 500 mg by mouth every 6 (six) hours as needed. For pain      . aspirin 81 MG tablet Take 81 mg by mouth daily.      Marland Kitchen azithromycin (ZITHROMAX) 250 MG tablet Two tablets day one, then one tablet daily next 4 days.  6 tablet  1  . Biotin 10 MG CAPS Take 1 capsule by mouth daily.      . Calcium Citrate-Vitamin D (CALCIUM CITRATE + D3 PO) Take 500 mg by mouth 3 (three) times daily.      . Cholecalciferol (VITAMIN D3) 50000 UNITS CAPS Take 1 capsule by mouth. I take one by mouth on Sunday, Monday, Wednesday, Friday, Saturday      . diphenhydramine-acetaminophen (TYLENOL PM) 25-500 MG TABS Take 1 tablet by mouth at bedtime.       . fish oil-omega-3 fatty acids 1000 MG capsule Take 1 g by mouth daily.      . hydrochlorothiazide (HYDRODIURIL) 25 MG tablet Take 25 mg by mouth daily.       Marland Kitchen levothyroxine (SYNTHROID, LEVOTHROID) 100 MCG tablet Take 100 mcg by mouth daily before breakfast. M & Th take 100 mcg; All other days take 50 mcg.      . levothyroxine (SYNTHROID, LEVOTHROID) 75 MCG tablet Take 75 mcg by mouth daily with breakfast.       . oxyCODONE-acetaminophen (PERCOCET/ROXICET) 5-325 MG per tablet  Take 1 tablet by mouth every 4 (four) hours as needed for moderate pain or severe pain.      . potassium gluconate 595 MG TABS Take 595 mg by mouth daily.       . pravastatin (PRAVACHOL) 40 MG tablet Take 20 mg by mouth every other day.       . predniSONE (DELTASONE) 20 MG tablet Take one pill two times daily for 3 days, take one pill daily for 4 days.  10 tablet  0  . vitamin B-12 (CYANOCOBALAMIN) 1000 MCG tablet Take 1,000 mcg by mouth 2 (two) times a week.        No current facility-administered medications for this visit.   Codeine Family History  Problem Relation Age of Onset  . Breast cancer Mother   . Cancer Mother     breast cancer  . Anuerysm Mother     brain anuerysm  . Heart attack Father   . Heart disease Father   . Heart attack Sister    Social History:   reports  that she quit smoking about 34 years ago. Her smoking use included Cigarettes. She has a 8 pack-year smoking history. She does not have any smokeless tobacco history on file. She reports that she does not drink alcohol or use illicit drugs.   REVIEW OF SYSTEMS - PERTINENT POSITIVES ONLY: No change  Physical Exam:   Blood pressure 130/70, pulse 68, temperature 97.8 F (36.6 C), resp. rate 18, height 5\' 3"  (1.6 m), weight 166 lb (75.297 kg). Body mass index is 29.41 kg/(m^2).  Gen:  WDWN WF NAD  Neurological: Alert and oriented to person, place, and time. Motor and sensory function is grossly intact  Head: Normocephalic and atraumatic.  Eyes: Conjunctivae are normal. Pupils are equal, round, and reactive to light. No scleral icterus.  Neck: Normal range of motion. Neck supple. No tracheal deviation or thyromegaly present.  Cardiovascular:  SR without murmurs or gallops.  No carotid bruits Respiratory: Effort normal.  No respiratory distress. No chest wall tenderness. Breath sounds normal.  No wheezes, rales or rhonchi.  Abdomen:  No acute right upper quadrant pain GU: Musculoskeletal: Normal range of motion. Extremities are nontender. No cyanosis, edema or clubbing noted Lymphadenopathy: No cervical, preauricular, postauricular or axillary adenopathy is present Skin: Skin is warm and dry. No rash noted. No diaphoresis. No erythema. No pallor. Pscyh: Normal mood and affect. Behavior is normal. Judgment and thought content normal.   LABORATORY RESULTS: No results found for this or any previous visit (from the past 48 hour(s)).  RADIOLOGY RESULTS: No results found.  Problem List: Patient Active Problem List   Diagnosis Date Noted  . 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 Obesity 48 06/18/2012  . Dyspnea 01/26/2011  . Dizziness 01/10/2011  . Palpitations 01/10/2011  . DIZZINESS 01/10/2011  . CHEST PAIN UNSPECIFIED 01/10/2011  . Hypertension   . Hyperlipidemia   .  Hypothyroidism   . Chest pain   . GERD (gastroesophageal reflux disease)   . HYPERTENSION 09/26/2010  . ABNORMAL HEART RHYTHMS 09/26/2010  . 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 ALLERGIC RHINITIS 09/26/2010  . HEADACHE, CHRONIC 09/26/2010    Assessment & Plan: Probable chronic cholecystitis.   Plan lap chole IOC    Matt B. Hassell Done, MD, [facility] [phone] beeper [phone]  11/06/2013 9:55 AM

## 2013-11-27 NOTE — Op Note (Signed)
Tracy Paul @date @  Procedure: Laparoscopic Cholecystectomy with intraoperative cholangiogram;  Run the small bowel to rule out internal hernia  Surgeon: Kaylyn Lim, MD, FACS Asst:  Alphonsa Overall, MD, FACS  Anes:  General  Drains: None  Findings: No internal hernia identified.  Normal IOC with small intrahepatic and extrahepatic radicals  Description of Procedure: The patient was taken to OR 1 and given general anesthesia.  The patient was prepped with PCMX and draped sterilely. A time out was performed.  Access to the abdomen was achieved with 5 mm Optiview through the right upper quadrant.  Port placement included a total of 5 ports to allow the small bowel inspection.  The upper midline was changed to a 12.  The terminal ileum was identified and run back to the jejunojejunostomy.  The roux limb was run back to the gastrojejunostomy.  No internal hernia was identified.    The gallbladder was visualized and the fundus was grasped and the gallbladder was elevated. Traction on the infundibulum allowed for successful demonstration of the critical view. Inflammatory changes were chronic to the fundus and duodenum.  The cystic duct was identified and clipped up on the gallbladder and an incision was made in the cystic duct and the Reddick catheter was inserted after milking the cystic duct of any debris. A dynamic cholangiogram was performed which demonstrated small ducts and no obstruction to flow into the duodenum.    The cystic duct was then triple clipped and divided, the cystic artery was double clipped and divided and then the gallbladder was removed from the gallbladder bed. Removal of the gallbladder from the gallbladder bed was performed easily.  The gallbladder was then placed in a bag and brought out through one of the 10 mm trocar sites. The gallbladder bed was inspected and no bleeding or bile leaks were seen.   Laparoscopic visualization was used when closing fascial defects for  trocar sites.   Incisions were injected with marcaine and closed with 4-0 Vicryl and Dermabond on the skin.  Sponge and needle count were correct.    The patient was taken to the recovery room in satisfactory condition.

## 2013-11-27 NOTE — Preoperative (Signed)
Beta Blockers   Reason not to administer Beta Blockers:Not Applicable, not on home BB 

## 2013-11-27 NOTE — Anesthesia Preprocedure Evaluation (Signed)
Anesthesia Evaluation  Patient identified by MRN, date of birth, ID band Patient awake    Reviewed: Allergy & Precautions, H&P , NPO status , Patient's Chart, lab work & pertinent test results, reviewed documented beta blocker date and time   History of Anesthesia Complications (+) PONV, MALIGNANT HYPERTHERMIA and history of anesthetic complications  Airway Mallampati: III TM Distance: >3 FB Neck ROM: full    Dental   Pulmonary neg pulmonary ROS, shortness of breath and with exertion, former smoker,  breath sounds clear to auscultation        Cardiovascular hypertension, On Medications + dysrhythmias Rhythm:regular     Neuro/Psych  Headaches, negative psych ROS   GI/Hepatic Neg liver ROS, GERD-  Medicated and Controlled,  Endo/Other  Hypothyroidism   Renal/GU negative Renal ROS     Musculoskeletal   Abdominal (+) - obese,   Peds  Hematology negative hematology ROS (+)   Anesthesia Other Findings See surgeon's H&P   Reproductive/Obstetrics negative OB ROS                           Anesthesia Physical  Anesthesia Plan  ASA: II  Anesthesia Plan: General   Post-op Pain Management:    Induction: Intravenous  Airway Management Planned: Oral ETT  Additional Equipment:   Intra-op Plan:   Post-operative Plan: Extubation in OR  Informed Consent: I have reviewed the patients History and Physical, chart, labs and discussed the procedure including the risks, benefits and alternatives for the proposed anesthesia with the patient or authorized representative who has indicated his/her understanding and acceptance.   Dental Advisory Given  Plan Discussed with: CRNA and Surgeon  Anesthesia Plan Comments:         Anesthesia Quick Evaluation

## 2013-11-28 LAB — CBC
HEMATOCRIT: 32.2 % — AB (ref 36.0–46.0)
Hemoglobin: 10.7 g/dL — ABNORMAL LOW (ref 12.0–15.0)
MCH: 30.7 pg (ref 26.0–34.0)
MCHC: 33.2 g/dL (ref 30.0–36.0)
MCV: 92.5 fL (ref 78.0–100.0)
Platelets: 199 10*3/uL (ref 150–400)
RBC: 3.48 MIL/uL — ABNORMAL LOW (ref 3.87–5.11)
RDW: 13.7 % (ref 11.5–15.5)
WBC: 9 10*3/uL (ref 4.0–10.5)

## 2013-11-28 LAB — COMPREHENSIVE METABOLIC PANEL
ALBUMIN: 2.9 g/dL — AB (ref 3.5–5.2)
ALT: 25 U/L (ref 0–35)
AST: 36 U/L (ref 0–37)
Alkaline Phosphatase: 66 U/L (ref 39–117)
BUN: 10 mg/dL (ref 6–23)
CO2: 27 mEq/L (ref 19–32)
CREATININE: 0.82 mg/dL (ref 0.50–1.10)
Calcium: 8.6 mg/dL (ref 8.4–10.5)
Chloride: 103 mEq/L (ref 96–112)
GFR calc Af Amer: 88 mL/min — ABNORMAL LOW (ref >90)
GFR calc non Af Amer: 76 mL/min — ABNORMAL LOW (ref >90)
Glucose, Bld: 133 mg/dL — ABNORMAL HIGH (ref 70–99)
POTASSIUM: 4.2 meq/L (ref 3.7–5.3)
Sodium: 140 mEq/L (ref 137–147)
TOTAL PROTEIN: 5.4 g/dL — AB (ref 6.0–8.3)
Total Bilirubin: 1.3 mg/dL — ABNORMAL HIGH (ref 0.3–1.2)

## 2013-11-28 MED ORDER — OXYCODONE-ACETAMINOPHEN 5-325 MG PO TABS: 1.0000 | ORAL_TABLET | ORAL | Status: DC | PRN

## 2013-11-28 NOTE — Discharge Instructions (Signed)
Laparoscopic Cholecystectomy, Care After °Refer to this sheet in the next few weeks. These instructions provide you with information on caring for yourself after your procedure. Your health care provider may also give you more specific instructions. Your treatment has been planned according to current medical practices, but problems sometimes occur. Call your health care provider if you have any problems or questions after your procedure. °WHAT TO EXPECT AFTER THE PROCEDURE °After your procedure, it is typical to have the following: °· Pain at your incision sites. You will be given pain medicines to control the pain. °· Mild nausea or vomiting. This should improve after the first 24 hours. °· Bloating and possibly shoulder pain from the gas used during the procedure. This will improve after the first 24 hours. °HOME CARE INSTRUCTIONS  °· Change bandages (dressings) as directed by your health care provider. °· Keep the wound dry and clean. You may wash the wound gently with soap and water. Gently blot or dab the area dry. °· Do not take baths or use swimming pools or hot tubs for 2 weeks or until your health care provider approves. °· Only take over-the-counter or prescription medicines as directed by your health care provider. °· Continue your normal diet as directed by your health care provider. °· Do not lift anything heavier than 10 pounds (4.5 kg) until your health care provider approves. °· Do not play contact sports for 1 week or until your health care provider approves. °SEEK MEDICAL CARE IF:  °· You have redness, swelling, or increasing pain in the wound. °· You notice yellowish-white fluid (pus) coming from the wound. °· You have drainage from the wound that lasts longer than 1 day. °· You notice a bad smell coming from the wound or dressing. °· Your surgical cuts (incisions) break open. °SEEK IMMEDIATE MEDICAL CARE IF:  °· You develop a rash. °· You have difficulty breathing. °· You have chest pain. °· You  have a fever. °· You have increasing pain in the shoulders (shoulder strap areas). °· You have dizzy episodes or faint while standing. °· You have severe abdominal pain. °· You feel sick to your stomach (nauseous) or throw up (vomit) and this lasts for more than 1 day. °Document Released: 10/15/2005 Document Revised: 08/05/2013 Document Reviewed: 05/27/2013 °[employer]® Patient Information ©2014 [employer]. ° °

## 2013-11-28 NOTE — Discharge Summary (Signed)
Physician Discharge Summary  Patient ID: Tracy Paul MRN: [id number] DOB/AGE: 1951-06-07 63 y.o.  Admit date: 11/27/2013 Discharge date: 11/28/2013  Admission Diagnoses:  Chronic cholecystitis  Discharge Diagnoses:  same  Active Problems:   S/P laparoscopic cholecystectomy   Surgery:  Lap chole IOC  Discharged Condition: improved  Hospital Course:   Had surgery.  Did well  Ready for discharge on PD 1  Consults: none  Significant Diagnostic Studies: IOC    Discharge Exam: Blood pressure 106/53, pulse 64, temperature 98.1 F (36.7 C), temperature source Oral, resp. rate 16, height 5\' 4"  (1.626 m), weight 160 lb (72.576 kg), SpO2 92.00%. Incisions ok  Disposition: 01-Home or Self Care  Discharge Orders   Future Appointments Provider Department Dept Phone   12/02/2013 10:30 AM [name], MD [place] [facility] [phone]   12/16/2013 10:30 AM Pedro Earls, MD [facility], [place] [phone]   02/23/2014 10:00 AM [name], MD [place] [facility] [phone]   Future Orders Complete By Expires   Diet - low sodium heart healthy  As directed    Discharge instructions  As directed    Comments:     Return to your preop longterm post bypass diet   Increase activity slowly  As directed    No wound care  As directed        Medication List         acetaminophen 500 MG tablet  Commonly known as:  TYLENOL  Take 500 mg by mouth every 6 (six) hours as needed. For pain     aspirin 81 MG tablet  Take 81 mg by mouth daily.     Biotin 10 MG Caps  Take 1 capsule by mouth daily.     CALCIUM CITRATE + D3 PO  Take 500 mg by mouth 3 (three) times daily.     diphenhydramine-acetaminophen 25-500 MG Tabs  Commonly known as:  TYLENOL PM  Take 1 tablet by mouth at bedtime.     fish oil-omega-3 fatty acids 1000 MG capsule  Take 1 g by mouth daily.     hydrochlorothiazide 25 MG tablet  Commonly  known as:  HYDRODIURIL  Take 25 mg by mouth every morning.     levothyroxine 75 MCG tablet  Commonly known as:  SYNTHROID, LEVOTHROID  Take 37.5-75 mcg by mouth daily before breakfast. Takes 1/2 tablet every day but takes 1 whole tablet on Monday and thursday     oxyCODONE-acetaminophen 5-325 MG per tablet  Commonly known as:  PERCOCET/ROXICET  Take 1-2 tablets by mouth every 4 (four) hours as needed for moderate pain.     potassium gluconate 595 MG Tabs tablet  Take 595 mg by mouth daily.     pravastatin 40 MG tablet  Commonly known as:  PRAVACHOL  Take 20 mg by mouth every other day.     vitamin B-12 1000 MCG tablet  Commonly known as:  CYANOCOBALAMIN  Take 1,000 mcg by mouth 2 (two) times a week.     Vitamin D3 50000 UNITS Caps  Take 1 capsule by mouth. I take one by mouth on Sunday, Monday, Wednesday, Friday, Saturday           Follow-up Information   Follow up with Johnathan Hausen B, MD In 3 weeks.   Specialty:  General Surgery   Contact information:   [address] [place] [address] [phone]       Signed: Pedro Earls 11/28/2013, 8:59  AM    

## 2013-11-28 NOTE — Progress Notes (Signed)
Utilization Review Completed.   Tyrell Brereton, RN, BSN Nurse Case Manager  

## 2013-11-30 ENCOUNTER — Encounter (HOSPITAL_COMMUNITY): Payer: Self-pay | Admitting: Surgery

## 2013-12-02 ENCOUNTER — Ambulatory Visit (INDEPENDENT_AMBULATORY_CARE_PROVIDER_SITE_OTHER): Payer: Federal, State, Local not specified - PPO | Admitting: Internal Medicine

## 2013-12-02 ENCOUNTER — Encounter: Payer: Self-pay | Admitting: Internal Medicine

## 2013-12-02 VITALS — BP 146/80 | HR 64 | Temp 99.3°F | Resp 18 | Wt 165.5 lb

## 2013-12-02 DIAGNOSIS — I10 Essential (primary) hypertension: Secondary | ICD-10-CM

## 2013-12-02 DIAGNOSIS — Z79899 Other long term (current) drug therapy: Secondary | ICD-10-CM

## 2013-12-02 DIAGNOSIS — R7309 Other abnormal glucose: Secondary | ICD-10-CM

## 2013-12-02 DIAGNOSIS — E559 Vitamin D deficiency, unspecified: Secondary | ICD-10-CM

## 2013-12-02 DIAGNOSIS — E785 Hyperlipidemia, unspecified: Secondary | ICD-10-CM

## 2013-12-02 DIAGNOSIS — R7303 Prediabetes: Secondary | ICD-10-CM

## 2013-12-02 LAB — CBC WITH DIFFERENTIAL/PLATELET
BASOS PCT: 1 % (ref 0–1)
Basophils Absolute: 0 10*3/uL (ref 0.0–0.1)
EOS PCT: 4 % (ref 0–5)
Eosinophils Absolute: 0.2 10*3/uL (ref 0.0–0.7)
HCT: 35.9 % — ABNORMAL LOW (ref 36.0–46.0)
HEMOGLOBIN: 12.2 g/dL (ref 12.0–15.0)
Lymphocytes Relative: 54 % — ABNORMAL HIGH (ref 12–46)
Lymphs Abs: 3 10*3/uL (ref 0.7–4.0)
MCH: 30.5 pg (ref 26.0–34.0)
MCHC: 34 g/dL (ref 30.0–36.0)
MCV: 89.8 fL (ref 78.0–100.0)
MONO ABS: 0.4 10*3/uL (ref 0.1–1.0)
MONOS PCT: 6 % (ref 3–12)
NEUTROS ABS: 1.9 10*3/uL (ref 1.7–7.7)
Neutrophils Relative %: 35 % — ABNORMAL LOW (ref 43–77)
Platelets: 256 10*3/uL (ref 150–400)
RBC: 4 MIL/uL (ref 3.87–5.11)
RDW: 14.2 % (ref 11.5–15.5)
WBC: 5.5 10*3/uL (ref 4.0–10.5)

## 2013-12-02 LAB — LIPID PANEL
CHOLESTEROL: 124 mg/dL (ref 0–200)
HDL: 58 mg/dL (ref >39)
LDL CALC: 51 mg/dL (ref 0–99)
Total CHOL/HDL Ratio: 2.1 Ratio
Triglycerides: 74 mg/dL (ref <150)
VLDL: 15 mg/dL (ref 0–40)

## 2013-12-02 LAB — HEPATIC FUNCTION PANEL
ALT: 26 U/L (ref 0–35)
AST: 23 U/L (ref 0–37)
Albumin: 3.7 g/dL (ref 3.5–5.2)
Alkaline Phosphatase: 75 U/L (ref 39–117)
Bilirubin, Direct: 0.4 mg/dL — ABNORMAL HIGH (ref 0.0–0.3)
Indirect Bilirubin: 1.3 mg/dL — ABNORMAL HIGH (ref 0.2–1.2)
TOTAL PROTEIN: 5.9 g/dL — AB (ref 6.0–8.3)
Total Bilirubin: 1.7 mg/dL — ABNORMAL HIGH (ref 0.2–1.2)

## 2013-12-02 LAB — BASIC METABOLIC PANEL WITH GFR
BUN: 9 mg/dL (ref 6–23)
CO2: 27 mEq/L (ref 19–32)
Calcium: 9.1 mg/dL (ref 8.4–10.5)
Chloride: 103 mEq/L (ref 96–112)
Creat: 0.75 mg/dL (ref 0.50–1.10)
GFR, Est African American: >89 mL/min
GFR, Est Non African American: 86 mL/min
GLUCOSE: 88 mg/dL (ref 70–99)
POTASSIUM: 3.9 meq/L (ref 3.5–5.3)
Sodium: 140 mEq/L (ref 135–145)

## 2013-12-02 LAB — HEMOGLOBIN A1C
Hgb A1c MFr Bld: 5.8 % — ABNORMAL HIGH (ref <5.7)
Mean Plasma Glucose: 120 mg/dL — ABNORMAL HIGH (ref <117)

## 2013-12-02 LAB — MAGNESIUM: Magnesium: 1.7 mg/dL (ref 1.5–2.5)

## 2013-12-02 LAB — TSH: TSH: 0.424 u[IU]/mL (ref 0.350–4.500)

## 2013-12-02 MED ORDER — PREDNISONE 20 MG PO TABS: ORAL_TABLET | ORAL | Status: DC

## 2013-12-02 NOTE — Progress Notes (Signed)
Patient ID: Tracy Paul, female   DOB: 07/01/1951, 63 y.o.   MRN: [id number]   This very nice 63 y.o. female presents for 3 month follow up with Hypertension, Hyperlipidemia, Pre-Diabetes and Vitamin D Deficiency. She had a Gastric Bipass in Feb 2014 and has lost 110 since before the surgery. More recently she had a lap chole  Just 5 days ago and is doing well.    HTN predates since 1995. BP has been controlled at home. Today's BP: 146/80 mmHg. Patient denies any cardiac type chest pain, palpitations, dyspnea/orthopnea/PND, dizziness, claudication, or dependent edema.   Hyperlipidemia is controlled with diet & meds. Last Cholesterol was 134, Triglycerides were  72, HDL 54 and LDL 66 in Oct 2014. Patient denies myalgias or other med SE's.    Also, the patient has history of PreDiabetes with A1c 5.8% in 2012 and last A1c was5.8% in Oct 2014. Patient denies any symptoms of reactive hypoglycemia, diabetic polys, paresthesias or visual blurring.   Further, Patient has history of Vitamin D Deficiency of 28 in 2008 with last vitamin D of 97 in Oct 2014. Patient supplements vitamin D without any suspected side-effects.    Medication List         acetaminophen 500 MG tablet  Commonly known as:  TYLENOL  Take 500 mg by mouth every 6 (six) hours as needed. For pain     aspirin 81 MG tablet  Take 81 mg by mouth daily.     Biotin 10 MG Caps  Take 1 capsule by mouth daily.     CALCIUM CITRATE + D3 PO  Take 500 mg by mouth 3 (three) times daily.     diphenhydramine-acetaminophen 25-500 MG Tabs  Commonly known as:  TYLENOL PM  Take 1 tablet by mouth at bedtime.     fish oil-omega-3 fatty acids 1000 MG capsule  Take 1 g by mouth daily.     hydrochlorothiazide 25 MG tablet  Commonly known as:  HYDRODIURIL  Take 25 mg by mouth every morning.     levothyroxine 75 MCG tablet  Commonly known as:  SYNTHROID, LEVOTHROID  Take 37.5-75 mcg by mouth daily before breakfast. Takes 1/2 tablet every day  but takes 1 whole tablet on Monday and thursday     oxyCODONE-acetaminophen 5-325 MG per tablet  Commonly known as:  PERCOCET/ROXICET  Take 1-2 tablets by mouth every 4 (four) hours as needed for moderate pain.     potassium gluconate 595 MG Tabs tablet  Take 595 mg by mouth daily.     pravastatin 40 MG tablet  Commonly known as:  PRAVACHOL  Take 20 mg by mouth every other day.     predniSONE 20 MG tablet  Commonly known as:  DELTASONE  1 tab 3 x day or as directed for TMJ     vitamin B-12 1000 MCG tablet  Commonly known as:  CYANOCOBALAMIN  Take 1,000 mcg by mouth 2 (two) times a week.     Vitamin D (Ergocalciferol) 50000 UNITS Caps capsule  Commonly known as:  DRISDOL     Vitamin D3 50000 UNITS Caps  Take 1 capsule by mouth. I take one by mouth on Sunday, Monday, Wednesday, Friday, Saturday         Allergies  Allergen Reactions  . Codeine Other (See Comments)    "puts me out of it"  . Nabumetone     GI upset    PMHx:   Past Medical History  Diagnosis Date  .  Hypertension   . Hyperlipidemia   . Hypothyroidism   . Thyroid nodule   . Allergic rhinitis   . PONV (postoperative nausea and vomiting)   . Hx of sinus bradycardia   . Arthritis   . Cholecystitis, chronic   . Dysrhythmia     Hx bradycardia - hx normal cardiac cath x2 / ECHO 2009 EF 60% LAE MYOVIEW 3/12 EF 60% NO ISCHEMIA  . Prediabetes     FHx:    Reviewed / unchanged  SHx:    Reviewed / unchanged  Systems Review: Constitutional: Denies fever, chills, wt changes, headaches, insomnia, fatigue, night sweats, change in appetite. Eyes: Denies redness, blurred vision, diplopia, discharge, itchy, watery eyes.  ENT: Denies discharge, congestion, post nasal drip, epistaxis, sore throat, earache, hearing loss, dental pain, tinnitus, vertigo, sinus pain, snoring.  CV: Denies chest pain, palpitations, irregular heartbeat, syncope, dyspnea, diaphoresis, orthopnea, PND, claudication, edema. Respiratory:  denies cough, dyspnea, DOE, pleurisy, hoarseness, laryngitis, wheezing.  Gastrointestinal: Denies dysphagia, odynophagia, heartburn, reflux, water brash, abdominal pain or cramps, nausea, vomiting, bloating, diarrhea, constipation, hematemesis, melena, hematochezia,  or hemorrhoids. Genitourinary: Denies dysuria, frequency, urgency, nocturia, hesitancy, discharge, hematuria, flank pain. Musculoskeletal: Denies arthralgias, myalgias, stiffness, jt. swelling, pain, limp, strain/sprain.  Skin: Denies pruritus, rash, hives, warts, acne, eczema, change in skin lesion(s). Neuro: No weakness, tremor, incoordination, spasms, paresthesia, or pain. Psychiatric: Denies confusion, memory loss, or sensory loss. Endo: Denies change in weight, skin, hair change.  Heme/Lymph: No excessive bleeding, bruising, orenlarged lymph nodes.  BP: 146/80  Pulse: 64  Temp: 99.3 F (37.4 C)  Resp: 18    Estimated body mass index is 28.39 kg/(m^2) as calculated from the following:   Height as of 11/27/13: 5\' 4"  (1.626 m).   Weight as of this encounter: 165 lb 8 oz (75.07 kg).  On Exam:  Appears well nourished - in no distress. Eyes: PERRLA, EOMs, conjunctiva no swelling or erythema. Sinuses: No frontal/maxillary tenderness ENT/Mouth: EAC's clear, TM's nl w/o erythema, bulging. Nares clear w/o erythema, swelling, exudates. Oropharynx clear without erythema or exudates. Oral hygiene is good. Tongue normal, non obstructing. Hearing intact.  Neck: Supple. Thyroid nl. Car 2+/2+ without bruits, nodes or JVD. Chest: Respirations nl with BS clear & equal w/o rales, rhonchi, wheezing or stridor.  Cor: Heart sounds normal w/ regular rate and rhythm without sig. murmurs, gallops, clicks, or rubs. Peripheral pulses normal and equal  without edema.  Abdomen: Soft & bowel sounds normal. Non-tender w/o guarding, rebound, hernias, masses, or organomegaly.  Lymphatics: Unremarkable.  Musculoskeletal: Full ROM all peripheral  extremities, joint stability, 5/5 strength, and normal gait.  Skin: Warm, dry without exposed rashes, lesions, ecchymosis apparent.  Neuro: Cranial nerves intact, reflexes equal bilaterally. Sensory-motor testing grossly intact. Tendon reflexes grossly intact.  Pysch: Alert & oriented x 3. Insight and judgement nl & appropriate. No ideations.  Assessment and Plan:  1. Hypertension - Continue monitor blood pressure at home. Continue diet/meds same.  2. Hyperlipidemia - Continue diet/meds, exercise,& lifestyle modifications. Continue monitor periodic cholesterol/liver & renal functions   3. Pre-diabetes - Continue diet, exercise, lifestyle modifications. Monitor appropriate labs.  4. Vitamin D Deficiency - Continue supplementation.  Recommended regular exercise, BP monitoring, weight control, and discussed med and SE's. Recommended labs to assess and monitor clinical status. Further disposition pending results of labs.

## 2013-12-02 NOTE — Patient Instructions (Signed)

## 2013-12-03 LAB — VITAMIN D 25 HYDROXY (VIT D DEFICIENCY, FRACTURES): VIT D 25 HYDROXY: 95 ng/mL — AB (ref 30–89)

## 2013-12-03 LAB — INSULIN, FASTING: Insulin fasting, serum: 4 u[IU]/mL (ref 3–28)

## 2013-12-07 ENCOUNTER — Telehealth: Payer: Self-pay

## 2013-12-07 NOTE — Telephone Encounter (Signed)
Message copied by Nadyne Coombes on Mon Dec 07, 2013  8:58 AM ------      Message from: [name]      Created: Sun Dec 06, 2013 11:44 PM       Cbc kidneys liver thyroid magnesium A1c vit D all nl & ok - keep up the excellent job with the weight loss ------

## 2013-12-07 NOTE — Telephone Encounter (Signed)
lmom to pt about lab results.

## 2013-12-16 ENCOUNTER — Ambulatory Visit (INDEPENDENT_AMBULATORY_CARE_PROVIDER_SITE_OTHER): Payer: Federal, State, Local not specified - PPO | Admitting: Surgery

## 2013-12-16 ENCOUNTER — Encounter (INDEPENDENT_AMBULATORY_CARE_PROVIDER_SITE_OTHER): Payer: Self-pay | Admitting: Surgery

## 2013-12-16 VITALS — BP 124/80 | HR 66 | Resp 16 | Ht 64.0 in | Wt 159.4 lb

## 2013-12-16 DIAGNOSIS — Z9049 Acquired absence of other specified parts of digestive tract: Secondary | ICD-10-CM

## 2013-12-16 DIAGNOSIS — Z9889 Other specified postprocedural states: Secondary | ICD-10-CM

## 2013-12-16 NOTE — Patient Instructions (Signed)
Good Carbs:  Brocccoli, asparagus, spinach, mustard greens,    Intermediate:  Beans  Bad Carbs: Potatoes (white, sweet, or French Fries), carrots, squash,  

## 2013-12-16 NOTE — Progress Notes (Signed)
TRYSTIN HARGROVE 63 y.o.  Body mass index is 27.35 kg/(m^2).  Patient Active Problem List   Diagnosis Date Noted  . Unspecified vitamin D deficiency 12/02/2013  . Prediabetes   . S/P laparoscopic cholecystectomy 11/27/2013  . 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 Obesity 48 06/18/2012  . Dyspnea 01/26/2011  . Dizziness 01/10/2011  . Palpitations 01/10/2011  . CHEST PAIN UNSPECIFIED 01/10/2011  . Hyperlipidemia   . Hypothyroidism   . GERD (gastroesophageal reflux disease)   . HYPERTENSION 09/26/2010  . 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 HEADACHE, CHRONIC 09/26/2010    Allergies  Allergen Reactions  . Codeine Other (See Comments)    "puts me out of it"  . Nabumetone     GI upset    Past Surgical History  Procedure Laterality Date  . Bilateral carpal tunnel surgery    . Replacement total knee bilateral      bilat  . Nasal sinus surgery    . Tubal ligation    . Breath tek h pylori  07/01/2012    Procedure: BREATH TEK H PYLORI;  Surgeon: Edward Jolly, MD;  Location: [facility];  Service: General;  Laterality: N/A;  . 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 Cholecystectomy N/A 11/27/2013    Procedure: LAPAROSCOPIC CHOLECYSTECTOMY WITH INTRAOPERATIVE CHOLANGIOGRAM;  Surgeon: Pedro Earls, MD;  Location: [facility];  Service: General;  Laterality: N/A;   MCKEOWN,WILLIAM DAVID, MD No diagnosis found.  Doing very well after lap chole.  Glenda Chroman has checked her lab recently.  Will see in a year--that will be 2 years postop roux Y gastric bypass Matt B. Hassell Done, MD, [facility] [phone] beeper [phone]  12/16/2013 10:46 AM

## 2013-12-27 ENCOUNTER — Other Ambulatory Visit: Payer: Self-pay | Admitting: Internal Medicine

## 2013-12-27 ENCOUNTER — Other Ambulatory Visit: Payer: Self-pay | Admitting: Emergency Medicine

## 2013-12-29 ENCOUNTER — Encounter: Payer: Self-pay | Admitting: Emergency Medicine

## 2013-12-29 ENCOUNTER — Ambulatory Visit (INDEPENDENT_AMBULATORY_CARE_PROVIDER_SITE_OTHER): Payer: Federal, State, Local not specified - PPO | Admitting: Emergency Medicine

## 2013-12-29 VITALS — BP 128/70 | HR 72 | Temp 98.0°F | Resp 18 | Ht 64.0 in | Wt 160.0 lb

## 2013-12-29 DIAGNOSIS — J329 Chronic sinusitis, unspecified: Secondary | ICD-10-CM

## 2013-12-29 MED ORDER — LEVOFLOXACIN 500 MG PO TABS: 500.0000 mg | ORAL_TABLET | Freq: Every day | ORAL | Status: AC

## 2013-12-29 NOTE — Patient Instructions (Signed)
Allergic Rhinitis Allergic rhinitis is when the mucous membranes in the nose respond to allergens. Allergens are particles in the air that cause your body to have an allergic reaction. This causes you to release allergic antibodies. Through a chain of events, these eventually cause you to release histamine into the blood stream. Although meant to protect the body, it is this release of histamine that causes your discomfort, such as frequent sneezing, congestion, and an itchy, runny nose.  CAUSES  Seasonal allergic rhinitis (hay fever) is caused by pollen allergens that may come from grasses, trees, and weeds. Year-round allergic rhinitis (perennial allergic rhinitis) is caused by allergens such as house dust mites, pet dander, and mold spores.  SYMPTOMS   Nasal stuffiness (congestion).  Itchy, runny nose with sneezing and tearing of the eyes. DIAGNOSIS  Your health care provider can help you determine the allergen or allergens that trigger your symptoms. If you and your health care provider are unable to determine the allergen, skin or blood testing may be used. TREATMENT  Allergic Rhinitis does not have a cure, but it can be controlled by:  Medicines and allergy shots (immunotherapy).  Avoiding the allergen. Hay fever may often be treated with antihistamines in pill or nasal spray forms. Antihistamines block the effects of histamine. There are over-the-counter medicines that may help with nasal congestion and swelling around the eyes. Check with your health care provider before taking or giving this medicine.  If avoiding the allergen or the medicine prescribed do not work, there are many new medicines your health care provider can prescribe. Stronger medicine may be used if initial measures are ineffective. Desensitizing injections can be used if medicine and avoidance does not work. Desensitization is when a patient is given ongoing shots until the body becomes less sensitive to the allergen.  Make sure you follow up with your health care provider if problems continue. HOME CARE INSTRUCTIONS It is not possible to completely avoid allergens, but you can reduce your symptoms by taking steps to limit your exposure to them. It helps to know exactly what you are allergic to so that you can avoid your specific triggers. SEEK MEDICAL CARE IF:   You have a fever.  You develop a cough that does not stop easily (persistent).  You have shortness of breath.  You start wheezing.  Symptoms interfere with normal daily activities. Document Released: 07/10/2001 Document Revised: 08/05/2013 Document Reviewed: 06/22/2013 Exit[employer] Patient Information 2014 [employer]. Sinusitis Sinusitis is redness, soreness, and puffiness (inflammation) of the air pockets in the bones of your face (sinuses). The redness, soreness, and puffiness can cause air and mucus to get trapped in your sinuses. This can allow germs to grow and cause an infection.  HOME CARE   Drink enough fluids to keep your pee (urine) clear or pale yellow.  Use a humidifier in your home.  Run a hot shower to create steam in the bathroom. Sit in the bathroom with the door closed. Breathe in the steam 3 4 times a day.  Put a warm, moist washcloth on your face 3 4 times a day, or as told by your doctor.  Use salt water sprays (saline sprays) to wet the thick fluid in your nose. This can help the sinuses drain.  Only take medicine as told by your doctor. GET HELP RIGHT AWAY IF:   Your pain gets worse.  You have very bad headaches.  You are sick to your stomach (nauseous).  You throw up (vomit).    You are very sleepy (drowsy) all the time.  Your face is puffy (swollen).  Your vision changes.  You have a stiff neck.  You have trouble breathing. MAKE SURE YOU:   Understand these instructions.  Will watch your condition.  Will get help right away if you are not doing well or get worse. Document Released: 04/02/2008  Document Revised: 07/09/2012 Document Reviewed: 05/20/2012 [employer] Patient Information 2014 [employer].  

## 2013-12-29 NOTE — Progress Notes (Signed)
Subjective:    Patient ID: Tracy Paul, female    DOB: 1951/09/14, 63 y.o.   MRN: [id number]  HPI Comments: 63 yo female with sinus congestion and pressure. She has been taking mucinex/ sinus OTC without relief. Increase H2o. Production from sinus and throat not cough. Denies fever. She has been off balance since increased congestion.   Sinusitis Associated symptoms include congestion, coughing, ear pain and sinus pressure.    Current Outpatient Prescriptions on File Prior to Visit  Medication Sig Dispense Refill  . acetaminophen (TYLENOL) 500 MG tablet Take 500 mg by mouth every 6 (six) hours as needed. For pain      . aspirin 81 MG tablet Take 81 mg by mouth daily.      . Biotin 10 MG CAPS Take 1 capsule by mouth daily.      . Calcium Citrate-Vitamin D (CALCIUM CITRATE + D3 PO) Take 500 mg by mouth 3 (three) times daily.      . Cholecalciferol (VITAMIN D3) 50000 UNITS CAPS Take 1 capsule by mouth. I take one by mouth on Sunday, Monday, Wednesday, Friday, Saturday      . diphenhydramine-acetaminophen (TYLENOL PM) 25-500 MG TABS Take 1 tablet by mouth at bedtime.       . fish oil-omega-3 fatty acids 1000 MG capsule Take 1 g by mouth daily.      . hydrochlorothiazide (HYDRODIURIL) 25 MG tablet Take 25 mg by mouth every morning.       Marland Kitchen levothyroxine (SYNTHROID, LEVOTHROID) 75 MCG tablet Take 37.5-75 mcg by mouth daily before breakfast. Takes 1/2 tablet every day but takes 1 whole tablet on Monday and thursday      . potassium gluconate 595 MG TABS Take 595 mg by mouth daily.       . vitamin B-12 (CYANOCOBALAMIN) 1000 MCG tablet Take 1,000 mcg by mouth 2 (two) times a week.        No current facility-administered medications on file prior to visit.   Allergies  Allergen Reactions  . Codeine Other (See Comments)    "puts me out of it"  . Nabumetone     GI upset   Past Medical History  Diagnosis Date  . Hypertension   . Hyperlipidemia   . Hypothyroidism   . Thyroid nodule    . Allergic rhinitis   . PONV (postoperative nausea and vomiting)   . Hx of sinus bradycardia   . Arthritis   . Cholecystitis, chronic   . Dysrhythmia     Hx bradycardia - hx normal cardiac cath x2 / ECHO 2009 EF 60% LAE MYOVIEW 3/12 EF 60% NO ISCHEMIA  . Prediabetes      Review of Systems  HENT: Positive for congestion, ear pain and sinus pressure.   Respiratory: Positive for cough.   Neurological: Positive for dizziness.  All other systems reviewed and are negative.   BP 128/70  Pulse 72  Temp(Src) 98 F (36.7 C) (Temporal)  Resp 18  Ht 5\' 4"  (1.626 m)  Wt 160 lb (72.576 kg)  BMI 27.45 kg/m2     Objective:   Physical Exam  Nursing note and vitals reviewed. Constitutional: She is oriented to person, place, and time. She appears well-developed and well-nourished.  HENT:  Head: Normocephalic and atraumatic.  Right Ear: External ear normal.  Left Ear: External ear normal.  Nose: Nose normal.  Mouth/Throat: Oropharyngeal exudate present.  Yellow TMs bilateral Yellow exudate pharynx Maxillary tenderness   Eyes: Conjunctivae and EOM are normal.  Neck: Normal range of motion.  Cardiovascular: Normal rate, regular rhythm, normal heart sounds and intact distal pulses.   Pulmonary/Chest: Effort normal and breath sounds normal.  Musculoskeletal: Normal range of motion.  Lymphadenopathy:    She has no cervical adenopathy.  Neurological: She is alert and oriented to person, place, and time.  Skin: Skin is warm and dry.  Psychiatric: She has a normal mood and affect. Judgment normal.          Assessment & Plan:  1. Sinusitis/ Allergic rhinitis- Allegra OTC, increase H2o, allergy hygiene explained. levaquin 500 mg AD. Can try OTC FLONASE AD

## 2014-01-05 ENCOUNTER — Other Ambulatory Visit: Payer: Self-pay | Admitting: Emergency Medicine

## 2014-01-05 MED ORDER — DOXYCYCLINE HYCLATE 100 MG PO TABS: 100.0000 mg | ORAL_TABLET | Freq: Two times a day (BID) | ORAL | Status: DC

## 2014-01-25 ENCOUNTER — Other Ambulatory Visit (INDEPENDENT_AMBULATORY_CARE_PROVIDER_SITE_OTHER): Payer: Self-pay | Admitting: Otolaryngology

## 2014-01-25 DIAGNOSIS — J329 Chronic sinusitis, unspecified: Secondary | ICD-10-CM

## 2014-02-01 ENCOUNTER — Ambulatory Visit
Admission: RE | Admit: 2014-02-01 | Discharge: 2014-02-01 | Disposition: A | Discharge status: Home / Self Care | Payer: Federal, State, Local not specified - PPO | Source: Ambulatory Visit | Attending: Otolaryngology | Admitting: Otolaryngology

## 2014-02-01 DIAGNOSIS — J329 Chronic sinusitis, unspecified: Secondary | ICD-10-CM

## 2014-02-21 ENCOUNTER — Other Ambulatory Visit: Payer: Self-pay | Admitting: Emergency Medicine

## 2014-02-22 ENCOUNTER — Encounter: Payer: Self-pay | Admitting: Internal Medicine

## 2014-02-22 NOTE — Progress Notes (Signed)
Patient ID: Tracy Paul, female   DOB: Jun 14, 1951, 63 y.o.   MRN: [id number]   Annual Screening Comprehensive Examination  This very nice 63 y.o. MWF presents for complete physical.  Patient has been followed for HTN, Prediabetes, Hyperlipidemia, and Vitamin D Deficiency.    HTN predates since 1995. Patient's BP has been controlled at home. Today's BP: 124/68 mmHg.  In 2009 she had a negative 2DEC and in 1995 & 2000 she had two negative heart cath's. Last year she had a negative Myoview preceding bariatric surgery. Today her EKG showed a severe sinus bradycardia of 38 BPM. She admits infrequent mild lightheadedness which has not been compromising. Patient denies any cardiac symptoms as chest pain, palpitations, shortness of breath or ankle swelling.   Patient's hyperlipidemia is controlled with diet and medications. Patient denies myalgias or other medication SE's. Last cholesterol last visit was 124, triglycerides  74, HDL 58 and LDL 51 in Feb 2015 - at goal.     Patient has prediabetes with A1c 5.8 in 2013 and last A1c 5.8% in Feb 2015 despite having lost  114 # ( from 270 # -> 156 #) over the last 6 months from her Bariatric surgery in Feb 2014. Patient denies reactive hypoglycemic symptoms, visual blurring, diabetic polys, or paresthesias.    Finally, patient has history of Vitamin D Deficiency of 28 in 2008 and last vitamin D was 97 in Oct 2014.  Medication Sig  . acetaminophen (TYLENOL) 500 MG tablet Take 500 mg  every 6 (six) hours as needed  . aspirin 81 MG tablet Take 81 mg  daily.  . Biotin 10 MG CAPS Take 1 capsule  daily.  . Calcium Citrate-Vitamin D  Take 500 mg  3  times daily.  . Cholecalciferol (VITAMIN D3) 50000 UNITS  Take 1 capsule  3 x week  . diphenhydramine-acetaminophen  25-500 MG  Take 1 tablet  at bedtime.   . fish oil-omega-3 fatty acids 1000 MG capsule Take 1 g  daily.  . hydrochlorothiazide  25 MG tablet Take 25 mg  every morning.   Marland Kitchen levothyroxine 75 MCG tablet  Takes 1/2 tablet every day but takes 1 whole tablet on Mon and thurs  . potassium gluconate 595 MG TABS Take 595 mg  daily.   . pravastatin  40 MG tablet Take 40 mg daily. 1/2 = 20 mg QOD  . vitamin B-12  1000 MCG tablet Take 1,000 mcg  2 x week.     Allergies  Allergen Reactions  . Codeine Other (See Comments)    "puts me out of it"  . Nabumetone     GI upset   Past Medical History  Diagnosis Date  . Hypertension   . Hyperlipidemia   . Hypothyroidism   . Thyroid nodule   . Allergic rhinitis   . PONV (postoperative nausea and vomiting)   . Hx of sinus bradycardia   . Arthritis   . Cholecystitis, chronic   . Dysrhythmia     Hx bradycardia - hx normal cardiac cath x2 / ECHO 2009 EF 60% LAE MYOVIEW 3/12 EF 60% NO ISCHEMIA  . Prediabetes     Past Surgical History  Procedure Laterality Date  . Bilateral carpal tunnel surgery    . Replacement total knee bilateral      bilat  . Nasal sinus surgery    . Tubal ligation    . Breath tek h pylori  07/01/2012    Procedure: BREATH TEK H PYLORI;  Surgeon:  Edward Jolly, MD;  Location: [facility];  Service: General;  Laterality: N/A;  . 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 Cholecystectomy N/A 11/27/2013    Procedure: LAPAROSCOPIC CHOLECYSTECTOMY WITH INTRAOPERATIVE CHOLANGIOGRAM;  Surgeon: Pedro Earls, MD;  Location: [facility];  Service: General;  Laterality: N/A;    Family History  Problem Relation Age of Onset  . Breast cancer Mother   . Cancer Mother     breast cancer  . Anuerysm Mother     brain anuerysm  . Heart attack Father   . Heart disease Father   . Heart attack Sister     History  Substance Use Topics  . Smoking status: Former Smoker -- 0.80 packs/day for 10 years    Types: Cigarettes    Quit date: 10/30/1979  . Smokeless tobacco:  Not on file  . Alcohol Use: No    ROS Constitutional: Denies fever, chills, weight loss/gain, headaches, insomnia, fatigue, night sweats, and change in appetite. Eyes: Denies redness, blurred vision, diplopia, discharge, itchy, watery eyes.  ENT: Denies discharge, congestion, post nasal drip, epistaxis, sore throat, earache, hearing loss, dental pain, Tinnitus, Vertigo, Sinus pain, snoring.  Cardio: Denies chest pain, palpitations, irregular heartbeat, syncope, dyspnea, diaphoresis, orthopnea, PND, claudication, edema Respiratory: denies cough, dyspnea, DOE, pleurisy, hoarseness, laryngitis, wheezing.  Gastrointestinal: Denies dysphagia, heartburn, reflux, water brash, pain, cramps, nausea, vomiting, bloating, diarrhea, constipation, hematemesis, melena, hematochezia, jaundice, hemorrhoids Genitourinary: Denies dysuria, frequency, urgency, nocturia, hesitancy, discharge, hematuria, flank pain Breast:Breast lumps, nipple discharge, bleeding.  Musculoskeletal: Denies arthralgia, myalgia, stiffness, Jt. Swelling, pain, limp, and strain/sprain. Skin: Denies puritis, rash, hives, warts, acne, eczema, changing in skin lesion Neuro: No weakness, tremor, incoordination, spasms, paresthesia, pain Psychiatric: Denies confusion, memory loss, sensory loss Endocrine: Denies change in weight, skin, hair change, nocturia, and paresthesia, diabetic polys, visual blurring, hyper / hypo glycemic episodes.  Heme/Lymph: No excessive bleeding, bruising, enlarged lymph nodes.   Physical Exam  BP 124/68  Pulse 48  Temp 98.6 F   Resp 16  Ht 5' 3.5"   Wt 156 lb   BMI 27.20 kg/m2  General Appearance: Well nourished, in no apparent distress. Eyes: PERRLA, EOMs, conjunctiva no swelling or erythema, normal fundi and vessels. Sinuses: No frontal/maxillary tenderness ENT/Mouth: EACs patent / TMs  nl. Nares clear without erythema, swelling, mucoid exudates. Oral hygiene is good. No erythema, swelling, or exudate.  Tongue normal, non-obstructing. Tonsils not swollen or erythematous. Hearing normal.  Neck: Supple, thyroid normal. No bruits, nodes or JVD. Respiratory: Respiratory effort normal.  BS equal and clear bilateral without rales, rhonci, wheezing or stridor. Cardio: Heart sounds are normal with slow rate and  regular rhythm and no murmurs, rubs or gallops. Peripheral pulses are normal and equal bilaterally without edema. No aortic or femoral bruits. Chest: symmetric with normal excursions and percussion. Breasts: Symmetric, without lumps, nipple discharge, retractions, or fibrocystic changes.  Abdomen: Flat, soft, with bowl sounds. Nontender, no guarding, rebound, hernias, masses, or organomegaly.  Lymphatics: Non tender without lymphadenopathy.  Musculoskeletal: Full ROM all peripheral extremities, joint stability, 5/5 strength, and normal gait. Skin: Warm and dry without rashes, lesions, cyanosis, clubbing or  ecchymosis.  Neuro: Cranial nerves intact, reflexes equal bilaterally. Normal muscle tone, no cerebellar symptoms. Sensation intact.  Pysch: Awake and oriented X 3, normal affect, Insight and Judgment appropriate.  Assessment and Plan  1. Annual Screening Examination 2. Hypertension  3. Hyperlipidemia 4. Pre Diabetes 5. Vitamin D Deficiency 6. Sinus Bradycardia - refer back to cardiology for holter and provocative testing  Continue prudent diet as discussed, weight control, BP monitoring, regular exercise, and medications. Discussed med's effects and SE's. Screening labs and tests as requested with regular follow-up as recommended.

## 2014-02-22 NOTE — Patient Instructions (Signed)

## 2014-02-23 ENCOUNTER — Ambulatory Visit (INDEPENDENT_AMBULATORY_CARE_PROVIDER_SITE_OTHER): Payer: Federal, State, Local not specified - PPO | Admitting: Internal Medicine

## 2014-02-23 ENCOUNTER — Encounter: Payer: Self-pay | Admitting: Internal Medicine

## 2014-02-23 VITALS — BP 124/68 | HR 48 | Temp 98.6°F | Resp 16 | Ht 63.5 in | Wt 156.0 lb

## 2014-02-23 DIAGNOSIS — Z0000 Encounter for general adult medical examination without abnormal findings: Secondary | ICD-10-CM

## 2014-02-23 DIAGNOSIS — Z111 Encounter for screening for respiratory tuberculosis: Secondary | ICD-10-CM

## 2014-02-23 DIAGNOSIS — Z1211 Encounter for screening for malignant neoplasm of colon: Secondary | ICD-10-CM

## 2014-02-23 DIAGNOSIS — Z1212 Encounter for screening for malignant neoplasm of rectum: Secondary | ICD-10-CM

## 2014-02-23 DIAGNOSIS — R7402 Elevation of levels of lactic acid dehydrogenase (LDH): Secondary | ICD-10-CM

## 2014-02-23 DIAGNOSIS — Z79899 Other long term (current) drug therapy: Secondary | ICD-10-CM

## 2014-02-23 DIAGNOSIS — Z113 Encounter for screening for infections with a predominantly sexual mode of transmission: Secondary | ICD-10-CM

## 2014-02-23 DIAGNOSIS — R740 Nonspecific elevation of levels of transaminase and lactic acid dehydrogenase [LDH]: Secondary | ICD-10-CM

## 2014-02-23 DIAGNOSIS — R001 Bradycardia, unspecified: Secondary | ICD-10-CM

## 2014-02-23 DIAGNOSIS — I10 Essential (primary) hypertension: Secondary | ICD-10-CM

## 2014-02-23 DIAGNOSIS — E559 Vitamin D deficiency, unspecified: Secondary | ICD-10-CM

## 2014-02-23 LAB — CBC WITH DIFFERENTIAL/PLATELET
BASOS ABS: 0 10*3/uL (ref 0.0–0.1)
BASOS PCT: 1 % (ref 0–1)
EOS PCT: 4 % (ref 0–5)
Eosinophils Absolute: 0.2 10*3/uL (ref 0.0–0.7)
HCT: 35.8 % — ABNORMAL LOW (ref 36.0–46.0)
Hemoglobin: 11.9 g/dL — ABNORMAL LOW (ref 12.0–15.0)
Lymphocytes Relative: 61 % — ABNORMAL HIGH (ref 12–46)
Lymphs Abs: 2.5 10*3/uL (ref 0.7–4.0)
MCH: 30.3 pg (ref 26.0–34.0)
MCHC: 33.2 g/dL (ref 30.0–36.0)
MCV: 91.1 fL (ref 78.0–100.0)
Monocytes Absolute: 0.2 10*3/uL (ref 0.1–1.0)
Monocytes Relative: 6 % (ref 3–12)
Neutro Abs: 1.1 10*3/uL — ABNORMAL LOW (ref 1.7–7.7)
Neutrophils Relative %: 28 % — ABNORMAL LOW (ref 43–77)
Platelets: 277 10*3/uL (ref 150–400)
RBC: 3.93 MIL/uL (ref 3.87–5.11)
RDW: 13.7 % (ref 11.5–15.5)
WBC: 4.1 10*3/uL (ref 4.0–10.5)

## 2014-02-23 LAB — HEMOGLOBIN A1C
Hgb A1c MFr Bld: 5.8 % — ABNORMAL HIGH (ref <5.7)
MEAN PLASMA GLUCOSE: 120 mg/dL — AB (ref <117)

## 2014-02-24 LAB — LIPID PANEL
CHOLESTEROL: 123 mg/dL (ref 0–200)
HDL: 60 mg/dL (ref >39)
LDL CALC: 53 mg/dL (ref 0–99)
TRIGLYCERIDES: 52 mg/dL (ref <150)
Total CHOL/HDL Ratio: 2.1 Ratio
VLDL: 10 mg/dL (ref 0–40)

## 2014-02-24 LAB — HEPATIC FUNCTION PANEL
ALK PHOS: 83 U/L (ref 39–117)
ALT: 27 U/L (ref 0–35)
AST: 29 U/L (ref 0–37)
Albumin: 3.7 g/dL (ref 3.5–5.2)
Bilirubin, Direct: 0.4 mg/dL — ABNORMAL HIGH (ref 0.0–0.3)
Indirect Bilirubin: 1.6 mg/dL — ABNORMAL HIGH (ref 0.2–1.2)
TOTAL PROTEIN: 5.8 g/dL — AB (ref 6.0–8.3)
Total Bilirubin: 2 mg/dL — ABNORMAL HIGH (ref 0.2–1.2)

## 2014-02-24 LAB — BASIC METABOLIC PANEL WITH GFR
BUN: 12 mg/dL (ref 6–23)
CO2: 28 mEq/L (ref 19–32)
CREATININE: 0.71 mg/dL (ref 0.50–1.10)
Calcium: 9 mg/dL (ref 8.4–10.5)
Chloride: 105 mEq/L (ref 96–112)
Glucose, Bld: 87 mg/dL (ref 70–99)
Potassium: 3.9 mEq/L (ref 3.5–5.3)
Sodium: 140 mEq/L (ref 135–145)

## 2014-02-24 LAB — VITAMIN B12: Vitamin B-12: 801 pg/mL (ref 211–911)

## 2014-02-24 LAB — MAGNESIUM: MAGNESIUM: 1.8 mg/dL (ref 1.5–2.5)

## 2014-02-24 LAB — MICROALBUMIN / CREATININE URINE RATIO
Creatinine, Urine: 74.9 mg/dL
Microalb Creat Ratio: 6.7 mg/g (ref 0.0–30.0)
Microalb, Ur: 0.5 mg/dL (ref 0.00–1.89)

## 2014-02-24 LAB — HEPATITIS B SURFACE ANTIBODY,QUALITATIVE: HEP B S AB: NEGATIVE

## 2014-02-24 LAB — RPR

## 2014-02-24 LAB — URINALYSIS, MICROSCOPIC ONLY
BACTERIA UA: NONE SEEN
CASTS: NONE SEEN
CRYSTALS: NONE SEEN
Squamous Epithelial / LPF: NONE SEEN

## 2014-02-24 LAB — VITAMIN D 25 HYDROXY (VIT D DEFICIENCY, FRACTURES): Vit D, 25-Hydroxy: 108 ng/mL — ABNORMAL HIGH (ref 30–89)

## 2014-02-24 LAB — TSH: TSH: 1.333 u[IU]/mL (ref 0.350–4.500)

## 2014-02-24 LAB — HEPATITIS B CORE ANTIBODY, TOTAL: Hep B Core Total Ab: NONREACTIVE

## 2014-02-24 LAB — HIV ANTIBODY (ROUTINE TESTING W REFLEX): HIV 1&2 Ab, 4th Generation: NONREACTIVE

## 2014-02-24 LAB — INSULIN, FASTING: INSULIN FASTING, SERUM: 5 u[IU]/mL (ref 3–28)

## 2014-02-24 LAB — HEPATITIS A ANTIBODY, TOTAL: HEP A TOTAL AB: BORDERLINE — AB

## 2014-02-24 LAB — HEPATITIS C ANTIBODY: HCV AB: NEGATIVE

## 2014-02-25 LAB — HEPATITIS B E ANTIBODY: HEPATITIS BE ANTIBODY: REACTIVE — AB

## 2014-03-05 ENCOUNTER — Telehealth: Payer: Self-pay | Admitting: *Deleted

## 2014-03-05 LAB — TB SKIN TEST
INDURATION: 0 mm
TB Skin Test: NEGATIVE

## 2014-03-05 NOTE — Telephone Encounter (Signed)
Informed patient of lab results. Hepatitis A and B were positive,  Hep C negative, HIV and RPR are negative.  Vitamin D up, so reduce Vitamin D to 50000 units 2 times a week.  Patient aware and informed positive B reported to [facility] Department and results will be sent to them.

## 2014-03-16 ENCOUNTER — Encounter: Payer: Self-pay | Admitting: Cardiology

## 2014-03-16 ENCOUNTER — Ambulatory Visit: Payer: Federal, State, Local not specified - PPO | Admitting: Cardiology

## 2014-03-16 ENCOUNTER — Ambulatory Visit (INDEPENDENT_AMBULATORY_CARE_PROVIDER_SITE_OTHER): Payer: Federal, State, Local not specified - PPO | Admitting: Cardiology

## 2014-03-16 VITALS — BP 140/76 | HR 52 | Ht 63.0 in | Wt 151.8 lb

## 2014-03-16 DIAGNOSIS — R55 Syncope and collapse: Secondary | ICD-10-CM

## 2014-03-16 DIAGNOSIS — R001 Bradycardia, unspecified: Secondary | ICD-10-CM

## 2014-03-16 DIAGNOSIS — I498 Other specified cardiac arrhythmias: Secondary | ICD-10-CM

## 2014-03-16 NOTE — Progress Notes (Signed)
HPI The patient presents for evaluation of bradycardia. She has been seen greater than 3 years ago. She has had a couple of distant caths with the last one about 15 years ago and apparently normal. She did have a stress perfusion studies last in 2012. This demonstrated no evidence of ischemia or infarct. She is referred here because of bradycardia noted on an EKG. She saw Tracy Richards, MD with a HR of 38.  I don't have this monitor.  She does get some lightheadedness. This seems to occur at any particular time either with sitting doing nothing more with activities but she cannot bring it on. She has not had frank syncope. She does have some mild orthostatic symptoms occasionally. She doesn't feel palpitations or tachycardia arrhythmias. She gets some back pain sporadically. She did some random chest discomfort but this is sporadic and she doesn't pay much attention to it. She does not have any reproducible chest neck or arm discomfort. She doesn't describe any shortness of breath, PND or orthopnea. She's very active.  Allergies  Allergen Reactions  . Codeine Other (See Comments)    "puts me out of it"  . Nabumetone     GI upset    Current Outpatient Prescriptions  Medication Sig Dispense Refill  . acetaminophen (TYLENOL) 500 MG tablet Take 500 mg by mouth every 6 (six) hours as needed. For pain      . aspirin 81 MG tablet Take 81 mg by mouth daily.      . Biotin 10 MG CAPS Take 1 capsule by mouth daily.      . Calcium Citrate-Vitamin D (CALCIUM CITRATE + D3 PO) Take 500 mg by mouth 3 (three) times daily.      . Cholecalciferol (VITAMIN D3) 50000 UNITS CAPS Take 1 capsule by mouth 2 (two) times a week. I take one by mouth on Sunday, Monday, Wednesday, Friday, Saturday      . diphenhydramine-acetaminophen (TYLENOL PM) 25-500 MG TABS Take 1 tablet by mouth at bedtime.       . fish oil-omega-3 fatty acids 1000 MG capsule Take 1 g by mouth daily.      . hydrochlorothiazide (HYDRODIURIL)  25 MG tablet Take 25 mg by mouth every morning.       Marland Kitchen levothyroxine (SYNTHROID, LEVOTHROID) 75 MCG tablet Take 37.5-75 mcg by mouth daily before breakfast. Takes 1/2 tablet every day but takes 1 whole tablet on Monday and thursday      . potassium gluconate 595 MG TABS Take 595 mg by mouth daily.       . pravastatin (PRAVACHOL) 40 MG tablet Take 40 mg by mouth daily. 1/2 = 20 mg QOD      . vitamin B-12 (CYANOCOBALAMIN) 1000 MCG tablet Take 1,000 mcg by mouth 2 (two) times a week.       . Vitamin D, Ergocalciferol, (DRISDOL) 50000 UNITS CAPS capsule        No current facility-administered medications for this visit.    Past Medical History  Diagnosis Date  . Hypertension     no treatment after 120 lb weight loss  . Hyperlipidemia   . Hypothyroidism   . Thyroid nodule   . Allergic rhinitis   . Arthritis   . Cholecystitis, chronic   . Dysrhythmia     Hx bradycardia - hx normal cardiac cath x2 / ECHO 2009 EF 60% LAE MYOVIEW 3/12 EF 60% NO ISCHEMIA  . Prediabetes     Past Surgical History  Procedure Laterality  Date  . Bilateral carpal tunnel surgery    . Replacement total knee bilateral      bilat  . Nasal sinus surgery    . Tubal ligation    . Breath tek h pylori  07/01/2012    Procedure: BREATH TEK H PYLORI;  Surgeon: Edward Jolly, MD;  Location: [facility];  Service: General;  Laterality: N/A;  . 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 Cholecystectomy N/A 11/27/2013    Procedure: LAPAROSCOPIC CHOLECYSTECTOMY WITH INTRAOPERATIVE CHOLANGIOGRAM;  Surgeon: Pedro Earls, MD;  Location: [facility];  Service: General;  Laterality: N/A;    Family History  Problem Relation Age of Onset  . Breast cancer Mother   . Cancer Mother     breast cancer  . Anuerysm Mother     brain anuerysm  . Heart attack  Father 59  . Heart attack Sister 59    History   Social History  . Marital Status: Married    Spouse Name: Herbie Baltimore    Number of Children: 2  . Years of Education: N/A   Occupational History  . Cashier    Social History Main Topics  . Smoking status: Former Smoker -- 0.80 packs/day for 10 years    Types: Cigarettes    Quit date: 10/30/1979  . Smokeless tobacco: Not on file  . Alcohol Use: No  . Drug Use: No  . Sexual Activity: Not on file   Other Topics Concern  . Not on file   Social History Narrative  . No narrative on file    ROS:  As stated in the HPI and negative for all other systems.  PHYSICAL EXAM Pulse 52  Ht 5\' 3"  (1.6 m)  Wt 151 lb 12.8 oz (68.856 kg)  BMI 26.90 kg/m2 GENERAL:  Well appearing HEENT:  Pupils equal round and reactive, fundi not visualized, oral mucosa unremarkable NECK:  No jugular venous distention, waveform within normal limits, carotid upstroke brisk and symmetric, no bruits, no thyromegaly LYMPHATICS:  No cervical, inguinal adenopathy LUNGS:  Clear to auscultation bilaterally BACK:  No CVA tenderness CHEST:  Unremarkable HEART:  PMI not displaced or sustained,S1 and S2 within normal limits, no S3, no S4, no clicks, no rubs, no murmurs ABD:  Flat, positive bowel sounds normal in frequency in pitch, no bruits, no rebound, no guarding, no midline pulsatile mass, no hepatomegaly, no splenomegaly EXT:  2 plus pulses throughout, no edema, no cyanosis no clubbing SKIN:  No rashes no nodules NEURO:  Cranial nerves II through XII grossly intact, motor grossly intact throughout [facility]:  Cognitively intact, oriented to person place and time  EKG:  Sinus bradycardia, rate 46, axis within normal limits, intervals within normal limits, poor anterior R-wave/. 03/16/2014  ASSESSMENT AND PLAN  BRADYCARDIA:  She did increase her HR up to the 70s walking around the office.  I will start with an event monitor to evaluate the light headed episodes.   Tracy Paul will need a 21 day event monitor.  The patients symptoms necessitate an event monitor.  The symptoms are too infrequent to be identified on a Holter monitor.    I will likely bring her back for a POET (Plain Old Exercise Treadmill) to look at chronotropic confidence.  I did review her labs.  TSH was recently normal.  CHEST PAIN:  This is infrequent and atypical.  At this point I do not think that cardiovascular testing is indicated.

## 2014-03-16 NOTE — Patient Instructions (Addendum)
Your physician has recommended that you wear an event monitor. Event monitors are medical devices that record the heart's electrical activity. Doctors most often [place] these monitors to diagnose arrhythmias. Arrhythmias are problems with the speed or rhythm of the heartbeat. The monitor is a small, portable device. You can wear one while you do your normal daily activities. This is usually used to diagnose what is causing palpitations/syncope (passing out). 21 days  Your physician recommends that you continue on your current medications as directed. Please refer to the Current Medication list given to you today.  Your physician recommends that you schedule a follow-up appointment in: when you return in July

## 2014-03-23 ENCOUNTER — Encounter: Payer: Self-pay | Admitting: *Deleted

## 2014-03-23 ENCOUNTER — Encounter (INDEPENDENT_AMBULATORY_CARE_PROVIDER_SITE_OTHER): Payer: Federal, State, Local not specified - PPO

## 2014-03-23 DIAGNOSIS — R001 Bradycardia, unspecified: Secondary | ICD-10-CM

## 2014-03-23 DIAGNOSIS — R55 Syncope and collapse: Secondary | ICD-10-CM

## 2014-03-23 NOTE — Progress Notes (Signed)
Patient ID: Tracy Paul, female   DOB: 10/22/1951, 63 y.o.   MRN: [id number] E-Cardio verite 21 day cardiac event monitor applied to patient.

## 2014-04-05 ENCOUNTER — Encounter: Payer: Self-pay | Admitting: Emergency Medicine

## 2014-04-05 ENCOUNTER — Ambulatory Visit (INDEPENDENT_AMBULATORY_CARE_PROVIDER_SITE_OTHER): Payer: Federal, State, Local not specified - PPO | Admitting: Emergency Medicine

## 2014-04-05 VITALS — BP 102/60 | HR 64 | Temp 97.8°F | Resp 18 | Ht 63.5 in | Wt 153.0 lb

## 2014-04-05 DIAGNOSIS — R5381 Other malaise: Secondary | ICD-10-CM

## 2014-04-05 DIAGNOSIS — R5383 Other fatigue: Principal | ICD-10-CM

## 2014-04-05 DIAGNOSIS — J329 Chronic sinusitis, unspecified: Secondary | ICD-10-CM

## 2014-04-05 LAB — BASIC METABOLIC PANEL WITH GFR
BUN: 11 mg/dL (ref 6–23)
CALCIUM: 9.1 mg/dL (ref 8.4–10.5)
CHLORIDE: 103 meq/L (ref 96–112)
CO2: 31 mEq/L (ref 19–32)
CREATININE: 0.85 mg/dL (ref 0.50–1.10)
GFR, Est African American: 86 mL/min
GFR, Est Non African American: 74 mL/min
Glucose, Bld: 83 mg/dL (ref 70–99)
Potassium: 3.2 mEq/L — ABNORMAL LOW (ref 3.5–5.3)
Sodium: 140 mEq/L (ref 135–145)

## 2014-04-05 LAB — HEPATIC FUNCTION PANEL
ALBUMIN: 3.7 g/dL (ref 3.5–5.2)
ALK PHOS: 103 U/L (ref 39–117)
ALT: 18 U/L (ref 0–35)
AST: 21 U/L (ref 0–37)
Bilirubin, Direct: 0.3 mg/dL (ref 0.0–0.3)
Indirect Bilirubin: 1 mg/dL (ref 0.2–1.2)
Total Bilirubin: 1.3 mg/dL — ABNORMAL HIGH (ref 0.2–1.2)
Total Protein: 6 g/dL (ref 6.0–8.3)

## 2014-04-05 LAB — CBC WITH DIFFERENTIAL/PLATELET
BASOS ABS: 0 10*3/uL (ref 0.0–0.1)
Basophils Relative: 0 % (ref 0–1)
Eosinophils Absolute: 0.3 10*3/uL (ref 0.0–0.7)
Eosinophils Relative: 3 % (ref 0–5)
HCT: 33.9 % — ABNORMAL LOW (ref 36.0–46.0)
Hemoglobin: 11.5 g/dL — ABNORMAL LOW (ref 12.0–15.0)
Lymphocytes Relative: 37 % (ref 12–46)
Lymphs Abs: 3.4 10*3/uL (ref 0.7–4.0)
MCH: 30.6 pg (ref 26.0–34.0)
MCHC: 33.9 g/dL (ref 30.0–36.0)
MCV: 90.2 fL (ref 78.0–100.0)
Monocytes Absolute: 0.5 10*3/uL (ref 0.1–1.0)
Monocytes Relative: 5 % (ref 3–12)
NEUTROS ABS: 5 10*3/uL (ref 1.7–7.7)
Neutrophils Relative %: 55 % (ref 43–77)
PLATELETS: 269 10*3/uL (ref 150–400)
RBC: 3.76 MIL/uL — ABNORMAL LOW (ref 3.87–5.11)
RDW: 13.4 % (ref 11.5–15.5)
WBC: 9.1 10*3/uL (ref 4.0–10.5)

## 2014-04-05 LAB — IRON AND TIBC
%SAT: 15 % — ABNORMAL LOW (ref 20–55)
Iron: 46 ug/dL (ref 42–145)
TIBC: 310 ug/dL (ref 250–470)
UIBC: 264 ug/dL (ref 125–400)

## 2014-04-05 MED ORDER — AMOXICILLIN 500 MG PO CAPS: 500.0000 mg | ORAL_CAPSULE | Freq: Three times a day (TID) | ORAL | Status: DC

## 2014-04-05 NOTE — Progress Notes (Signed)
Subjective:    Patient ID: Jacqualine Code, female    DOB: [birth year], 63 y.o.   MRN: [id number]  HPI Comments: 63 yo WF. She has had sore throat x 4 days. She notes nasal production has increased in thickness and color. She notes increased left sinus pressure.  She has been on OTC sinus/ mucinex without relief. NS OTC no relief.   She has been feeling more tired over last couple weeks. She was recently diagnosed with HEP A. She did have low HGB and protein at last ov. She is currently having heart monitor for low pulse. WBC             4.1   02/23/2014 HGB            11.9   02/23/2014 HCT            35.8   02/23/2014 PLT             277   02/23/2014 GLUCOSE          87   02/23/2014 CHOL            123   02/23/2014 TRIG             52   02/23/2014 HDL              60   02/23/2014 LDLCALC          53   02/23/2014 ALT              27   02/23/2014 AST              29   02/23/2014 NA              140   02/23/2014 K               3.9   02/23/2014 CL              105   02/23/2014 CREATININE     0.71   02/23/2014 BUN              12   02/23/2014 CO2              28   02/23/2014 TSH           1.333   02/23/2014 INR            1.44   11/06/2009 HGBA1C          5.8   02/23/2014 MICROALBUR     0.50   02/23/2014   Sore Throat  Associated symptoms include congestion and coughing.  Cough     Medication List       This list is accurate as of: 04/05/14 11:06 AM.  Always use your most recent med list.               acetaminophen 500 MG tablet  Commonly known as:  TYLENOL  Take 500 mg by mouth every 6 (six) hours as needed. For pain     aspirin 81 MG tablet  Take 81 mg by mouth daily.     Biotin 10 MG Caps  Take 1 capsule by mouth daily.     CALCIUM CITRATE + D3 PO  Take 500 mg by mouth 3 (three) times daily.     diphenhydramine-acetaminophen 25-500 MG Tabs  Commonly known as:  TYLENOL PM  Take 1 tablet by mouth at bedtime.     fish oil-omega-3 fatty acids 1000  MG capsule  Take 1 g by mouth  daily.     hydrochlorothiazide 25 MG tablet  Commonly known as:  HYDRODIURIL  Take 25 mg by mouth every morning.     levothyroxine 75 MCG tablet  Commonly known as:  SYNTHROID, LEVOTHROID  Take 37.5-75 mcg by mouth daily before breakfast. Takes 1/2 tablet every day but takes 1 whole tablet on Monday and thursday     potassium gluconate 595 MG Tabs tablet  Take 595 mg by mouth daily.     pravastatin 40 MG tablet  Commonly known as:  PRAVACHOL  Take 40 mg by mouth daily. 1/2 = 20 mg QOD     vitamin B-12 1000 MCG tablet  Commonly known as:  CYANOCOBALAMIN  Take 1,000 mcg by mouth 2 (two) times a week.     Vitamin D (Ergocalciferol) 50000 UNITS Caps capsule  Commonly known as:  DRISDOL     Vitamin D3 50000 UNITS Caps  Take 1 capsule by mouth 2 (two) times a week. I take one by mouth on Sunday, Monday, Wednesday, Friday, Saturday          Review of Systems  Constitutional: Positive for fatigue.  HENT: Positive for congestion and sinus pressure.   Respiratory: Positive for cough.   All other systems reviewed and are negative.  BP 102/60  Pulse 64  Temp(Src) 97.8 F (36.6 C) (Temporal)  Resp 18  Ht 5' 3.5" (1.613 m)  Wt 153 lb (69.4 kg)  BMI 26.67 kg/m2  SpO2 98%   Objective:   Physical Exam  Nursing note and vitals reviewed. Constitutional: She is oriented to person, place, and time. She appears well-developed and well-nourished.  HENT:  Head: Normocephalic and atraumatic.  Right Ear: External ear normal.  Left Ear: External ear normal.  Nose: Nose normal.  Mouth/Throat: Oropharynx is clear and moist. No oropharyngeal exudate.  Yellow TMs bilateral, left Maxillary tenderness/ Frontal tenderness    Eyes: Conjunctivae and EOM are normal.  Neck: Normal range of motion.  Cardiovascular: Normal rate, regular rhythm, normal heart sounds and intact distal pulses.   Pulmonary/Chest: Effort normal and breath sounds normal.  Musculoskeletal: Normal range of motion.   Lymphadenopathy:    She has no cervical adenopathy.  Neurological: She is alert and oriented to person, place, and time.  Skin: Skin is warm and dry.  Psychiatric: She has a normal mood and affect. Judgment normal.          Assessment & Plan:  1. Fatigue vs hypotension vs hep A- check labs, increase activity and H2O D/c Hctz and monitor for fluid.  2. Sinusitis- Amox 500 AD, Nasacort/ Flonase NS, Claritin OTC AD, continue mucinex AD

## 2014-04-05 NOTE — Patient Instructions (Signed)
Hepatitis A Hepatitis A is a viral infection of the liver. There is no evidence that this form of hepatitis progresses to a longstanding (chronic) form. Hepatitis A does not progress to cancer of the liver as it may with chronic forms of hepatitis B and hepatitis C. CAUSES Hepatitis A is caused by the hepatitis A virus (HAV). The virus enters the body through the mouth by unclean (contaminated) food or water. Once in the body, viral infection begins in the bowel and spreads to the liver, causing illness.  SYMPTOMS Most cases of hepatitis A are mild and cause few problems. Many people may not even realize they are sick. Others may feel tired, have nausea and vomiting, lose their appetite, have a low-grade fever, or experience pain under the ribs on the right side of the belly.Later on, symptoms may include dark-colored urine, light-colored bowel movements, yellowing of the eyes and skin (jaundice), or itchy skin. Hepatitis A can sometimes become severe and some symptoms may last for weeks.  DIAGNOSIS Your caregiver can do a blood test to see if you have the disease. TREATMENT Hepatitis A usually resolves on its own over several weeks. There is no specific treatment for the disease once the virus has caused infection. It is important for the infected person to get plenty of rest and not return to work or school until fever is gone, appetite returns, and skin and eyes are no longer yellow.It is also important to avoid drinking alcohol and taking any medicine not prescribed or approved by your caregiver. PREVENTION Giving human immunoglobulin following exposure will help prevent or lessen the severity of the illness. This is only effective if given within 2 weeks following exposure.  Hepatitis A vaccine is available and highly effective in preventing hepatitis A when given both before and after exposure to this infection.In the [place] this vaccine is currently recommended as a substitute for human  immunoglobulin within 2 weeks following exposure to the virus as well as for the following people before exposure:  All children beginning at age 80 year.  Children ages 62 to 78 in states and communities with existing vaccination programs.  International travelers to all areas where hepatitis A remains a problem.  Men who have sex with men.  Injection drug users.  People with chronic liver disease due to any cause.  People receiving clotting factor concentrates.  People who work with the hepatitis A virus in research lab settings. HOME CARE INSTRUCTIONS After becoming infected with the HAV, there will be a 2 to 6 week incubation period before getting sick. There will be shedding of the virus in the stool within 10 to 14 days of the infection. Because of the passing of viral particles in the stool, good hygiene will help prevent the spread of this disease.  Frequent hand washing following use of the bathroom and before handling food or water is encouraged.  This infection is contagious. Follow your caregiver's instructions in order to avoid spread of the infection.  People who live with you should receive the hepatitis A vaccine.  Do not take any medicines, including common nonprescription medicines, until you ask your caregiver if it is okay. SEEK IMMEDIATE MEDICAL CARE IF:   You are unable to eat or drink.  Nausea or vomiting starts or continues.  You feel confused.  Jaundice becomes more severe.  You become very sleepy or have trouble waking up. MAKE SURE YOU:   Understand these instructions.  Will watch your condition.  Will get  help right away if you are not doing well or get worse. Document Released: 10/12/2000 Document Revised: 01/07/2012 Document Reviewed: 02/13/2011 [facility] Patient Information 2014 [place], [place].

## 2014-04-27 ENCOUNTER — Telehealth: Payer: Self-pay | Admitting: *Deleted

## 2014-04-27 NOTE — Telephone Encounter (Signed)
lmtcb about heart monitor results Horton Chin RN

## 2014-04-27 NOTE — Telephone Encounter (Signed)
Pt is aware of monitor results & follow-up appointment with Dr. Percival Spanish on 05/10/14 Horton Chin RN

## 2014-05-10 ENCOUNTER — Ambulatory Visit: Payer: Federal, State, Local not specified - PPO | Admitting: Cardiology

## 2014-05-26 ENCOUNTER — Ambulatory Visit: Payer: Self-pay | Admitting: Physician Assistant

## 2014-05-27 ENCOUNTER — Ambulatory Visit: Payer: Self-pay | Admitting: Physician Assistant

## 2014-05-30 ENCOUNTER — Other Ambulatory Visit: Payer: Self-pay | Admitting: Internal Medicine

## 2014-06-02 ENCOUNTER — Ambulatory Visit (INDEPENDENT_AMBULATORY_CARE_PROVIDER_SITE_OTHER): Payer: Federal, State, Local not specified - PPO | Admitting: Physician Assistant

## 2014-06-02 VITALS — BP 110/62 | HR 56 | Temp 97.9°F | Resp 16 | Wt 150.0 lb

## 2014-06-02 DIAGNOSIS — E559 Vitamin D deficiency, unspecified: Secondary | ICD-10-CM

## 2014-06-02 DIAGNOSIS — E785 Hyperlipidemia, unspecified: Secondary | ICD-10-CM

## 2014-06-02 DIAGNOSIS — I10 Essential (primary) hypertension: Secondary | ICD-10-CM

## 2014-06-02 DIAGNOSIS — E039 Hypothyroidism, unspecified: Secondary | ICD-10-CM

## 2014-06-02 DIAGNOSIS — Z79899 Other long term (current) drug therapy: Secondary | ICD-10-CM

## 2014-06-02 DIAGNOSIS — R7303 Prediabetes: Secondary | ICD-10-CM

## 2014-06-02 DIAGNOSIS — R7309 Other abnormal glucose: Secondary | ICD-10-CM

## 2014-06-02 LAB — CBC WITH DIFFERENTIAL/PLATELET
BASOS PCT: 1 % (ref 0–1)
Basophils Absolute: 0.1 10*3/uL (ref 0.0–0.1)
EOS PCT: 5 % (ref 0–5)
Eosinophils Absolute: 0.3 10*3/uL (ref 0.0–0.7)
HCT: 34.2 % — ABNORMAL LOW (ref 36.0–46.0)
Hemoglobin: 11.4 g/dL — ABNORMAL LOW (ref 12.0–15.0)
Lymphocytes Relative: 44 % (ref 12–46)
Lymphs Abs: 2.2 10*3/uL (ref 0.7–4.0)
MCH: 29.8 pg (ref 26.0–34.0)
MCHC: 33.3 g/dL (ref 30.0–36.0)
MCV: 89.3 fL (ref 78.0–100.0)
Monocytes Absolute: 0.3 10*3/uL (ref 0.1–1.0)
Monocytes Relative: 6 % (ref 3–12)
NEUTROS PCT: 44 % (ref 43–77)
Neutro Abs: 2.2 10*3/uL (ref 1.7–7.7)
PLATELETS: 275 10*3/uL (ref 150–400)
RBC: 3.83 MIL/uL — ABNORMAL LOW (ref 3.87–5.11)
RDW: 13.9 % (ref 11.5–15.5)
WBC: 5 10*3/uL (ref 4.0–10.5)

## 2014-06-02 LAB — HEMOGLOBIN A1C
HEMOGLOBIN A1C: 5.7 % — AB (ref <5.7)
MEAN PLASMA GLUCOSE: 117 mg/dL — AB (ref <117)

## 2014-06-02 NOTE — Progress Notes (Signed)
Assessment and Plan:  Hypertension: Continue medication, monitor blood pressure at home. Continue DASH diet. Cholesterol: Continue diet and exercise. Check cholesterol.  Pre-diabetes-Continue diet and exercise. Check A1C Vitamin D Def- check level and continue medications.  s/p gastric bypass- Check labs to do to screen for vitamin deficiencies associated with gastric bypass including Vitamin D, B12, iron. Recommend strict diet and exercise.   Continue diet and meds as discussed. Further disposition pending results of labs.  HPI 63 y.o. female  presents for 3 month follow up with hypertension, hyperlipidemia, prediabetes and vitamin D. Her blood pressure has been controlled at home, today their BP is BP: 110/62 mmHg She does not workout, she has had several deaths in the family and has been traveling a lot, not even able to work. She denies chest pain, shortness of breath, dizziness.  She is on cholesterol medication and denies myalgias. Her cholesterol is at goal. The cholesterol last visit was:   Lab Results  Component Value Date   CHOL 123 02/23/2014   HDL 60 02/23/2014   LDLCALC 53 02/23/2014   TRIG 52 02/23/2014   CHOLHDL 2.1 02/23/2014   She has been working on diet and exercise for prediabetes, and denies nausea, paresthesia of the feet and polydipsia. Last A1C in the office was:  Lab Results  Component Value Date   HGBA1C 5.8* 02/23/2014   Patient is on Vitamin D supplement.   Lab Results  Component Value Date   VD25OH 108* 02/23/2014     She is on thyroid medication. Her medication was not changed last visit. Patient denies nervousness, palpitations and weight changes.  Lab Results  Component Value Date   TSH 1.333 02/23/2014  .   Current Medications:  Current Outpatient Prescriptions on File Prior to Visit  Medication Sig Dispense Refill  . acetaminophen (TYLENOL) 500 MG tablet Take 500 mg by mouth every 6 (six) hours as needed. For pain      . aspirin 81 MG tablet Take 81  mg by mouth daily.      . Biotin 10 MG CAPS Take 1 capsule by mouth daily.      . Calcium Citrate-Vitamin D (CALCIUM CITRATE + D3 PO) Take 500 mg by mouth 3 (three) times daily.      . Cholecalciferol (VITAMIN D3) 50000 UNITS CAPS Take 1 capsule by mouth 2 (two) times a week. I take one by mouth on Sunday, Monday, Wednesday, Friday, Saturday      . diphenhydramine-acetaminophen (TYLENOL PM) 25-500 MG TABS Take 1 tablet by mouth at bedtime.       . fish oil-omega-3 fatty acids 1000 MG capsule Take 1 g by mouth daily.      . hydrochlorothiazide (HYDRODIURIL) 25 MG tablet TAKE ONE TABLET BY MOUTH ONCE DAILY FOR BLOOD PRESSURE AND FLUID  90 tablet  0  . levothyroxine (SYNTHROID, LEVOTHROID) 75 MCG tablet TAKE ONE TABLET BY MOUTH ONCE DAILY  90 tablet  0  . potassium gluconate 595 MG TABS Take 595 mg by mouth daily.       . pravastatin (PRAVACHOL) 40 MG tablet Take 40 mg by mouth daily. 1/2 = 20 mg QOD      . vitamin B-12 (CYANOCOBALAMIN) 1000 MCG tablet Take 1,000 mcg by mouth 2 (two) times a week.       . Vitamin D, Ergocalciferol, (DRISDOL) 50000 UNITS CAPS capsule        No current facility-administered medications on file prior to visit.   Medical History:  Past Medical History  Diagnosis Date  . Hypertension     no treatment after 120 lb weight loss  . Hyperlipidemia   . Hypothyroidism   . Thyroid nodule   . Allergic rhinitis   . Arthritis   . Cholecystitis, chronic   . Dysrhythmia     Hx bradycardia - hx normal cardiac cath x2 / ECHO 2009 EF 60% LAE MYOVIEW 3/12 EF 60% NO ISCHEMIA  . Prediabetes    Allergies:  Allergies  Allergen Reactions  . Codeine Other (See Comments)    "puts me out of it"  . Nabumetone     GI upset     Review of Systems: [X]  = complains of  [ ]  = denies  General: Fatigue [ ]  Fever [ ]  Chills [ ]  Weakness [ ]   Insomnia [ ]  Eyes: Redness [ ]  Blurred vision [ ]  Diplopia [ ]   ENT: Congestion [ ]  Sinus Pain [ ]  Post Nasal Drip [ ]  Sore Throat [ ]  Earache  [ ]   Cardiac: Chest pain/pressure [ ]  SOB [ ]  Orthopnea [ ]   Palpitations [ ]   Paroxysmal nocturnal dyspnea[ ]  Claudication [ ]  Edema [ ]   Pulmonary: Cough [ ]  Wheezing[ ]   SOB [ ]   Snoring [ ]   GI: Nausea [ ]  Vomiting[ ]  Dysphagia[ ]  Heartburn[ ]  Abdominal pain [ ]  Constipation [ ] ; Diarrhea [ ] ; BRBPR [ ]  Melena[ ]  GU: Hematuria[ ]  Dysuria [ ]  Nocturia[ ]  Urgency [ ]   Hesitancy [ ]  Discharge [ ]  Neuro: Headaches[ ]  Vertigo[ ]  Paresthesias[ ]  Spasm [ ]  Speech changes [ ]  Incoordination [ ]   Ortho: Arthritis [ ]  Joint pain [ ]  Muscle pain [ ]  Joint swelling [ ]  Back Pain [ ]  Skin:  Rash [ ]   Pruritis [ ]  Change in skin lesion [ ]   Psych: Depression[ ]  Anxiety[ ]  Confusion [ ]  Memory loss [ ]   Heme/Lypmh: Bleeding [ ]  Bruising [ ]  Enlarged lymph nodes [ ]   Endocrine: Visual blurring [ ]  Paresthesia [ ]  Polyuria [ ]  Polydypsea [ ]    Heat/cold intolerance [ ]  Hypoglycemia [ ]   Family history- Review and unchanged Social history- Review and unchanged Physical Exam: BP 110/62  Pulse 56  Temp(Src) 97.9 F (36.6 C)  Resp 16  Wt 150 lb (68.04 kg) Wt Readings from Last 3 Encounters:  06/02/14 150 lb (68.04 kg)  04/05/14 153 lb (69.4 kg)  03/16/14 151 lb 12.8 oz (68.856 kg)   General Appearance: Well nourished, in no apparent distress. Eyes: PERRLA, EOMs, conjunctiva no swelling or erythema Sinuses: No Frontal/maxillary tenderness ENT/Mouth: Ext aud canals clear, TMs without erythema, bulging. No erythema, swelling, or exudate on post pharynx.  Tonsils not swollen or erythematous. Hearing normal.  Neck: Supple, thyroid normal.  Respiratory: Respiratory effort normal, BS equal bilaterally without rales, rhonchi, wheezing or stridor.  Cardio: RRR with no MRGs. Brisk peripheral pulses without edema.  Abdomen: Soft, + BS.  Non tender, no guarding, rebound, hernias, masses. Lymphatics: Non tender without lymphadenopathy.  Musculoskeletal: Full ROM, 5/5 strength, normal gait.  Skin: Warm, dry  without rashes, lesions, ecchymosis.  Neuro: Cranial nerves intact. Normal muscle tone, no cerebellar symptoms. Sensation intact.  Psych: Awake and oriented X 3, normal affect, Insight and Judgment appropriate.    Vicie Mutters 10:44 AM

## 2014-06-03 LAB — BASIC METABOLIC PANEL WITH GFR
BUN: 12 mg/dL (ref 6–23)
CALCIUM: 9.3 mg/dL (ref 8.4–10.5)
CO2: 28 mEq/L (ref 19–32)
CREATININE: 0.83 mg/dL (ref 0.50–1.10)
Chloride: 105 mEq/L (ref 96–112)
GFR, EST AFRICAN AMERICAN: 87 mL/min
GFR, Est Non African American: 76 mL/min
GLUCOSE: 89 mg/dL (ref 70–99)
Potassium: 3.6 mEq/L (ref 3.5–5.3)
SODIUM: 140 meq/L (ref 135–145)

## 2014-06-03 LAB — HEPATIC FUNCTION PANEL
ALBUMIN: 3.9 g/dL (ref 3.5–5.2)
ALT: 24 U/L (ref 0–35)
AST: 26 U/L (ref 0–37)
Alkaline Phosphatase: 91 U/L (ref 39–117)
BILIRUBIN DIRECT: 0.3 mg/dL (ref 0.0–0.3)
Indirect Bilirubin: 1.1 mg/dL (ref 0.2–1.2)
Total Bilirubin: 1.4 mg/dL — ABNORMAL HIGH (ref 0.2–1.2)
Total Protein: 6.1 g/dL (ref 6.0–8.3)

## 2014-06-03 LAB — LIPID PANEL
CHOLESTEROL: 128 mg/dL (ref 0–200)
HDL: 63 mg/dL (ref >39)
LDL Cholesterol: 54 mg/dL (ref 0–99)
TRIGLYCERIDES: 54 mg/dL (ref <150)
Total CHOL/HDL Ratio: 2 Ratio
VLDL: 11 mg/dL (ref 0–40)

## 2014-06-03 LAB — VITAMIN D 25 HYDROXY (VIT D DEFICIENCY, FRACTURES): VIT D 25 HYDROXY: 87 ng/mL (ref 30–89)

## 2014-06-03 LAB — TSH: TSH: 1.419 u[IU]/mL (ref 0.350–4.500)

## 2014-06-03 LAB — MAGNESIUM: MAGNESIUM: 1.9 mg/dL (ref 1.5–2.5)

## 2014-06-03 LAB — INSULIN, FASTING: INSULIN FASTING, SERUM: 5 u[IU]/mL (ref 3–28)

## 2014-09-01 ENCOUNTER — Ambulatory Visit: Payer: Self-pay | Admitting: Internal Medicine

## 2014-09-02 ENCOUNTER — Ambulatory Visit (INDEPENDENT_AMBULATORY_CARE_PROVIDER_SITE_OTHER): Payer: Federal, State, Local not specified - PPO | Admitting: Internal Medicine

## 2014-09-02 ENCOUNTER — Encounter: Payer: Self-pay | Admitting: Internal Medicine

## 2014-09-02 VITALS — BP 110/64 | HR 52 | Temp 97.9°F | Resp 16 | Ht 64.0 in | Wt 150.4 lb

## 2014-09-02 DIAGNOSIS — E785 Hyperlipidemia, unspecified: Secondary | ICD-10-CM

## 2014-09-02 DIAGNOSIS — R7309 Other abnormal glucose: Secondary | ICD-10-CM

## 2014-09-02 DIAGNOSIS — R7303 Prediabetes: Secondary | ICD-10-CM

## 2014-09-02 DIAGNOSIS — Z23 Encounter for immunization: Secondary | ICD-10-CM

## 2014-09-02 DIAGNOSIS — I10 Essential (primary) hypertension: Secondary | ICD-10-CM

## 2014-09-02 DIAGNOSIS — E559 Vitamin D deficiency, unspecified: Secondary | ICD-10-CM

## 2014-09-02 DIAGNOSIS — Z79899 Other long term (current) drug therapy: Secondary | ICD-10-CM

## 2014-09-02 LAB — CBC WITH DIFFERENTIAL/PLATELET
Basophils Absolute: 0 10*3/uL (ref 0.0–0.1)
Basophils Relative: 1 % (ref 0–1)
Eosinophils Absolute: 0.2 10*3/uL (ref 0.0–0.7)
Eosinophils Relative: 4 % (ref 0–5)
HCT: 36.8 % (ref 36.0–46.0)
Hemoglobin: 12.1 g/dL (ref 12.0–15.0)
LYMPHS PCT: 54 % — AB (ref 12–46)
Lymphs Abs: 2.6 10*3/uL (ref 0.7–4.0)
MCH: 30 pg (ref 26.0–34.0)
MCHC: 32.9 g/dL (ref 30.0–36.0)
MCV: 91.1 fL (ref 78.0–100.0)
Monocytes Absolute: 0.4 10*3/uL (ref 0.1–1.0)
Monocytes Relative: 8 % (ref 3–12)
NEUTROS ABS: 1.6 10*3/uL — AB (ref 1.7–7.7)
NEUTROS PCT: 33 % — AB (ref 43–77)
PLATELETS: 302 10*3/uL (ref 150–400)
RBC: 4.04 MIL/uL (ref 3.87–5.11)
RDW: 13.3 % (ref 11.5–15.5)
WBC: 4.8 10*3/uL (ref 4.0–10.5)

## 2014-09-02 NOTE — Patient Instructions (Signed)

## 2014-09-02 NOTE — Progress Notes (Signed)
Patient ID: Tracy Paul, female   DOB: 24-Dec-1950, 63 y.o.   MRN: [id number]   This very nice 63 y.o.female presents for 3 month follow up with Hypertension, Hyperlipidemia, Hypothyroidism, Pre-Diabetes and Vitamin D Deficiency. Hypothyroidism has been stable on current dose.    Patient is treated for HTN & BP has been controlled at home. Today's BP: 110/64 mmHg. Patient has had no complaints of any cardiac type chest pain, palpitations, dyspnea/orthopnea/PND, dizziness, claudication, or dependent edema.   Hyperlipidemia is controlled with diet & meds. Patient denies myalgias or other med SE's. Last Lipids were Total Chol 128; HDL 63; LDL  54; Trig 54 on 06/02/2014.   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 She's has had no symptoms of reactive hypoglycemia, diabetic polys, paresthesias or visual blurring.  Last A1c was 5.7% on  06/02/2014.   Further, the patient also has history of Vitamin D Deficiency (28 in 2008) and supplements vitamin D without any suspected side-effects. Last vitamin D was  87 on 06/02/2014.   Medication List   aspirin 81 MG tablet  Take 81 mg by mouth daily.     Biotin 10 MG Caps  Take 1 capsule by mouth daily.     CALCIUM CITRATE + D3 PO  Take 500 mg by mouth 3 (three) times daily.     diphenhydramine-acetaminophen 25-500 MG Tabs  Commonly known as:  TYLENOL PM  Take 1 tablet by mouth at bedtime.     fish oil-omega-3 fatty acids 1000 MG capsule  Take 1 g by mouth daily.     hydrochlorothiazide 25 MG tablet  Commonly known as:  HYDRODIURIL  TAKE ONE TABLET BY MOUTH ONCE DAILY FOR BLOOD PRESSURE AND FLUID     levothyroxine 75 MCG tablet  Commonly known as:  SYNTHROID, LEVOTHROID  TAKE ONE TABLET BY MOUTH ONCE DAILY     potassium gluconate 595 MG Tabs tablet  Take 595 mg by mouth daily.     pravastatin 40 MG tablet  Commonly known as:  PRAVACHOL   Take 40 mg by mouth daily. 1/2 = 20 mg QOD     vitamin B-12 1000 MCG tablet  Commonly known as:  CYANOCOBALAMIN  Take 1,000 mcg by mouth 2 (two) times a week.     Vitamin D (Ergocalciferol) 50000 UNITS Caps capsule  Commonly known as:  DRISDOL     Vitamin D3 50000 UNITS Caps  Take 1 capsule by mouth 2 (two) times a week.     Allergies  Allergen Reactions  . Codeine Other (See Comments)    "puts me out of it"  . Nabumetone     GI upset   PMHx:   Past Medical History  Diagnosis Date  . Hypertension     no treatment after 120 lb weight loss  . Hyperlipidemia   . Hypothyroidism   . Thyroid nodule   . Allergic rhinitis   . Arthritis   . Cholecystitis, chronic   . Dysrhythmia     Hx bradycardia - hx normal cardiac cath x2 / ECHO 2009 EF 60% LAE MYOVIEW 3/12 EF 60% NO ISCHEMIA  . Prediabetes    Immunization History  Administered Date(s) Administered  . Influenza Split 08/25/2013, 09/02/2014  . PPD Test 02/23/2014  . Pneumococcal Polysaccharide-23 10/18/2008  . Td 08/08/2006  . Zoster 10/15/2012   Past Surgical History  Procedure Laterality  Date  . Bilateral carpal tunnel surgery    . Replacement total knee bilateral      bilat  . Nasal sinus surgery    . Tubal ligation    . Breath tek h pylori  07/01/2012    Procedure: BREATH TEK H PYLORI;  Surgeon: Edward Jolly, MD;  Location: [facility];  Service: General;  Laterality: N/A;  . 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 Cholecystectomy N/A 11/27/2013    Procedure: LAPAROSCOPIC CHOLECYSTECTOMY WITH INTRAOPERATIVE CHOLANGIOGRAM;  Surgeon: Pedro Earls, MD;  Location: [facility];  Service: General;  Laterality: N/A;   FHx:    Reviewed / unchanged  SHx:    Reviewed / unchanged  Systems Review:  Constitutional: Denies fever, chills, wt  changes, headaches, insomnia, fatigue, night sweats, change in appetite. Eyes: Denies redness, blurred vision, diplopia, discharge, itchy, watery eyes.  ENT: Denies discharge, congestion, post nasal drip, epistaxis, sore throat, earache, hearing loss, dental pain, tinnitus, vertigo, sinus pain, snoring.  CV: Denies chest pain, palpitations, irregular heartbeat, syncope, dyspnea, diaphoresis, orthopnea, PND, claudication or edema. Respiratory: denies cough, dyspnea, DOE, pleurisy, hoarseness, laryngitis, wheezing.  Gastrointestinal: Denies dysphagia, odynophagia, heartburn, reflux, water brash, abdominal pain or cramps, nausea, vomiting, bloating, diarrhea, constipation, hematemesis, melena, hematochezia  or hemorrhoids. Genitourinary: Denies dysuria, frequency, urgency, nocturia, hesitancy, discharge, hematuria or flank pain. Musculoskeletal: Denies arthralgias, myalgias, stiffness, jt. swelling, pain, limping or strain/sprain.  Skin: Denies pruritus, rash, hives, warts, acne, eczema or change in skin lesion(s). Neuro: No weakness, tremor, incoordination, spasms, paresthesia or pain. Psychiatric: Denies confusion, memory loss or sensory loss. Endo: Denies change in weight, skin or hair change.  Heme/Lymph: No excessive bleeding, bruising or enlarged lymph nodes.  Exam:  BP 110/64 mmHg  Pulse 52  Temp 97.9 F   Resp 16  Ht 5\' 4"    Wt 150 lb 6.4 oz   BMI 25.80   Appears well nourished and in no distress. Eyes: PERRLA, EOMs, conjunctiva no swelling or erythema. Sinuses: No frontal/maxillary tenderness ENT/Mouth: EAC's clear, TM's nl w/o erythema, bulging. Nares clear w/o erythema, swelling, exudates. Oropharynx clear without erythema or exudates. Oral hygiene is good. Tongue normal, non obstructing. Hearing intact.  Neck: Supple. Thyroid nl. Car 2+/2+ without bruits, nodes or JVD. Chest: Respirations nl with BS clear & equal w/o rales, rhonchi, wheezing or stridor.  Cor: Heart sounds  normal w/ regular rate and rhythm without sig. murmurs, gallops, clicks, or rubs. Peripheral pulses normal and equal  without edema.  Abdomen: Soft & bowel sounds normal. Non-tender w/o guarding, rebound, hernias, masses, or organomegaly.  Lymphatics: Unremarkable.  Musculoskeletal: Full ROM all peripheral extremities, joint stability, 5/5 strength, and normal gait.  Skin: Warm, dry without exposed rashes, lesions or ecchymosis apparent.  Neuro: Cranial nerves intact, reflexes equal bilaterally. Sensory-motor testing grossly intact. Tendon reflexes grossly intact.  Pysch: Alert & oriented x 3.  Insight and judgement nl & appropriate. No ideations.  Assessment and Plan:  1. Hypertension - Continue monitor blood pressure at home. Continue diet/meds same.  2. Hyperlipidemia - Continue diet/meds, exercise,& lifestyle modifications. Continue monitor periodic cholesterol/liver & renal functions   3. Pre-Diabetes - Continue diet, exercise, lifestyle modifications. Monitor appropriate labs.  4. Vitamin D Deficiency - Continue supplementation.  5. S/p Gastric Bi-pass Surgery   Recommended regular exercise, BP monitoring, weight control,  and discussed med and SE's. Recommended labs to assess and monitor clinical status. Further disposition pending results of labs.

## 2014-09-03 LAB — BASIC METABOLIC PANEL WITH GFR
BUN: 11 mg/dL (ref 6–23)
CHLORIDE: 103 meq/L (ref 96–112)
CO2: 29 mEq/L (ref 19–32)
Calcium: 9.2 mg/dL (ref 8.4–10.5)
Creat: 0.79 mg/dL (ref 0.50–1.10)
GFR, EST NON AFRICAN AMERICAN: 80 mL/min
GFR, Est African American: >89 mL/min
Glucose, Bld: 82 mg/dL (ref 70–99)
POTASSIUM: 4.1 meq/L (ref 3.5–5.3)
Sodium: 142 mEq/L (ref 135–145)

## 2014-09-03 LAB — HEPATIC FUNCTION PANEL
ALK PHOS: 90 U/L (ref 39–117)
ALT: 14 U/L (ref 0–35)
AST: 20 U/L (ref 0–37)
Albumin: 3.6 g/dL (ref 3.5–5.2)
BILIRUBIN DIRECT: 0.3 mg/dL (ref 0.0–0.3)
BILIRUBIN INDIRECT: 1.2 mg/dL (ref 0.2–1.2)
Total Bilirubin: 1.5 mg/dL — ABNORMAL HIGH (ref 0.2–1.2)
Total Protein: 6.1 g/dL (ref 6.0–8.3)

## 2014-09-03 LAB — VITAMIN D 25 HYDROXY (VIT D DEFICIENCY, FRACTURES): Vit D, 25-Hydroxy: 93 ng/mL — ABNORMAL HIGH (ref 30–89)

## 2014-09-03 LAB — LIPID PANEL
CHOL/HDL RATIO: 2 ratio
CHOLESTEROL: 127 mg/dL (ref 0–200)
HDL: 62 mg/dL (ref >39)
LDL CALC: 53 mg/dL (ref 0–99)
Triglycerides: 59 mg/dL (ref <150)
VLDL: 12 mg/dL (ref 0–40)

## 2014-09-03 LAB — TSH: TSH: 1.345 u[IU]/mL (ref 0.350–4.500)

## 2014-09-03 LAB — HEMOGLOBIN A1C
HEMOGLOBIN A1C: 5.8 % — AB (ref <5.7)
Mean Plasma Glucose: 120 mg/dL — ABNORMAL HIGH (ref <117)

## 2014-09-03 LAB — INSULIN, FASTING: Insulin fasting, serum: 2.4 u[IU]/mL (ref 2.0–19.6)

## 2014-09-03 LAB — MAGNESIUM: Magnesium: 1.8 mg/dL (ref 1.5–2.5)

## 2014-09-16 ENCOUNTER — Ambulatory Visit (INDEPENDENT_AMBULATORY_CARE_PROVIDER_SITE_OTHER): Payer: Federal, State, Local not specified - PPO | Admitting: Physician Assistant

## 2014-09-16 ENCOUNTER — Encounter: Payer: Self-pay | Admitting: Physician Assistant

## 2014-09-16 ENCOUNTER — Ambulatory Visit (HOSPITAL_COMMUNITY)
Admission: RE | Admit: 2014-09-16 | Discharge: 2014-09-16 | Disposition: A | Discharge status: Home / Self Care | Payer: Federal, State, Local not specified - PPO | Source: Ambulatory Visit | Attending: Physician Assistant | Admitting: Physician Assistant

## 2014-09-16 VITALS — BP 126/64 | HR 55 | Temp 100.0°F | Resp 18 | Wt 149.0 lb

## 2014-09-16 DIAGNOSIS — M25511 Pain in right shoulder: Secondary | ICD-10-CM

## 2014-09-16 MED ORDER — TRAMADOL HCL 50 MG PO TABS: ORAL_TABLET | ORAL | Status: DC

## 2014-09-16 NOTE — Patient Instructions (Addendum)
- Take the Tramadol with food for severe pain.  Take 1/2 to 1 tablet every 6  hours as needed for shoulder pain. -Take Ibuprofen for mild to moderate pain. -Go to [facility] hospital to get x-ray.  Will call with results. -Rest and ice or heat will help with pain. -Do exercises below to help strengthen muscles. -Please follow up in one month   Shoulder Exercises EXERCISES  RANGE OF MOTION (ROM) AND STRETCHING EXERCISES These exercises may help you when beginning to rehabilitate your injury. Your symptoms may resolve with or without further involvement from your physician, physical therapist or athletic trainer. While completing these exercises, remember:   Restoring tissue flexibility helps normal motion to return to the joints. This allows healthier, less painful movement and activity.  An effective stretch should be held for at least 30 seconds.  A stretch should never be painful. You should only feel a gentle lengthening or release in the stretched tissue. ROM - Pendulum  Bend at the waist so that your right / left arm falls away from your body. Support yourself with your opposite hand on a solid surface, such as a table or a countertop.  Your right / left arm should be perpendicular to the ground. If it is not perpendicular, you need to lean over farther. Relax the muscles in your right / left arm and shoulder as much as possible.  Gently sway your hips and trunk so they move your right / left arm without any use of your right / left shoulder muscles.  Progress your movements so that your right / left arm moves side to side, then forward and backward, and finally, both clockwise and counterclockwise.  Complete __________ repetitions in each direction. Many people use this exercise to relieve discomfort in their shoulder as well as to gain range of motion. Repeat __________ times. Complete this exercise __________ times per day. STRETCH - Flexion, Standing  Stand with good posture.  With an underhand grip on your right / left hand and an overhand grip on the opposite hand, grasp a broomstick or cane so that your hands are a little more than shoulder-width apart.  Keeping your right / left elbow straight and shoulder muscles relaxed, push the stick with your opposite hand to raise your right / left arm in front of your body and then overhead. Raise your arm until you feel a stretch in your right / left shoulder, but before you have increased shoulder pain.  Try to avoid shrugging your right / left shoulder as your arm rises by keeping your shoulder blade tucked down and toward your mid-back spine. Hold __________ seconds.  Slowly return to the starting position. Repeat __________ times. Complete this exercise __________ times per day. STRETCH - Internal Rotation  Place your right / left hand behind your back, palm-up.  Throw a towel or belt over your opposite shoulder. Grasp the towel/belt with your right / left hand.  While keeping an upright posture, gently pull up on the towel/belt until you feel a stretch in the front of your right / left shoulder.  Avoid shrugging your right / left shoulder as your arm rises by keeping your shoulder blade tucked down and toward your mid-back spine.  Hold __________. Release the stretch by lowering your opposite hand. Repeat __________ times. Complete this exercise __________ times per day. STRETCH - External Rotation and Abduction  Stagger your stance through a doorframe. It does not matter which foot is forward.  As instructed by  your physician, physical therapist or athletic trainer, place your hands:  And forearms above your head and on the door frame.  And forearms at head-height and on the door frame.  At elbow-height and on the door frame.  Keeping your head and chest upright and your stomach muscles tight to prevent over-extending your low-back, slowly shift your weight onto your front foot until you feel a stretch  across your chest and/or in the front of your shoulders.  Hold __________ seconds. Shift your weight to your back foot to release the stretch. Repeat __________ times. Complete this stretch __________ times per day.  STRENGTHENING EXERCISES  These exercises may help you when beginning to rehabilitate your injury. They may resolve your symptoms with or without further involvement from your physician, physical therapist or athletic trainer. While completing these exercises, remember:   Muscles can gain both the endurance and the strength needed for everyday activities through controlled exercises.  Complete these exercises as instructed by your physician, physical therapist or athletic trainer. Progress the resistance and repetitions only as guided.  You may experience muscle soreness or fatigue, but the pain or discomfort you are trying to eliminate should never worsen during these exercises. If this pain does worsen, stop and make certain you are following the directions exactly. If the pain is still present after adjustments, discontinue the exercise until you can discuss the trouble with your clinician.  If advised by your physician, during your recovery, avoid activity or exercises which involve actions that place your right / left hand or elbow above your head or behind your back or head. These positions stress the tissues which are trying to heal. STRENGTH - Scapular Depression and Adduction  With good posture, sit on a firm chair. Supported your arms in front of you with pillows, arm rests or a table top. Have your elbows in line with the sides of your body.  Gently draw your shoulder blades down and toward your mid-back spine. Gradually increase the tension without tensing the muscles along the top of your shoulders and the back of your neck.  Hold for __________ seconds. Slowly release the tension and relax your muscles completely before completing the next repetition.  After you have  practiced this exercise, remove the arm support and complete it in standing as well as sitting. Repeat __________ times. Complete this exercise __________ times per day.  STRENGTH - External Rotators  Secure a rubber exercise band/tubing to a fixed object so that it is at the same height as your right / left elbow when you are standing or sitting on a firm surface.  Stand or sit so that the secured exercise band/tubing is at your side that is not injured.  Bend your elbow 90 degrees. Place a folded towel or small pillow under your right / left arm so that your elbow is a few inches away from your side.  Keeping the tension on the exercise band/tubing, pull it away from your body, as if pivoting on your elbow. Be sure to keep your body steady so that the movement is only coming from your shoulder rotating.  Hold __________ seconds. Release the tension in a controlled manner as you return to the starting position. Repeat __________ times. Complete this exercise __________ times per day.  STRENGTH - Supraspinatus  Stand or sit with good posture. Grasp a __________ weight or an exercise band/tubing so that your hand is "thumbs-up," like when you shake hands.  Slowly lift your right / left  hand from your thigh into the air, traveling about 30 degrees from straight out at your side. Lift your hand to shoulder height or as far as you can without increasing any shoulder pain. Initially, many people do not lift their hands above shoulder height.  Avoid shrugging your right / left shoulder as your arm rises by keeping your shoulder blade tucked down and toward your mid-back spine.  Hold for __________ seconds. Control the descent of your hand as you slowly return to your starting position. Repeat __________ times. Complete this exercise __________ times per day.  STRENGTH - Shoulder Extensors  Secure a rubber exercise band/tubing so that it is at the height of your shoulders when you are either  standing or sitting on a firm arm-less chair.  With a thumbs-up grip, grasp an end of the band/tubing in each hand. Straighten your elbows and lift your hands straight in front of you at shoulder height. Step back away from the secured end of band/tubing until it becomes tense.  Squeezing your shoulder blades together, pull your hands down to the sides of your thighs. Do not allow your hands to go behind you.  Hold for __________ seconds. Slowly ease the tension on the band/tubing as you reverse the directions and return to the starting position. Repeat __________ times. Complete this exercise __________ times per day.  STRENGTH - Scapular Retractors  Secure a rubber exercise band/tubing so that it is at the height of your shoulders when you are either standing or sitting on a firm arm-less chair.  With a palm-down grip, grasp an end of the band/tubing in each hand. Straighten your elbows and lift your hands straight in front of you at shoulder height. Step back away from the secured end of band/tubing until it becomes tense.  Squeezing your shoulder blades together, draw your elbows back as you bend them. Keep your upper arm lifted away from your body throughout the exercise.  Hold __________ seconds. Slowly ease the tension on the band/tubing as you reverse the directions and return to the starting position. Repeat __________ times. Complete this exercise __________ times per day. STRENGTH - Scapular Depressors  Find a sturdy chair without wheels, such as a from a dining room table.  Keeping your feet on the floor, lift your bottom from the seat and lock your elbows.  Keeping your elbows straight, allow gravity to pull your body weight down. Your shoulders will rise toward your ears.  Raise your body against gravity by drawing your shoulder blades down your back, shortening the distance between your shoulders and ears. Although your feet should always maintain contact with the floor, your  feet should progressively support less body weight as you get stronger.  Hold __________ seconds. In a controlled and slow manner, lower your body weight to begin the next repetition. Repeat __________ times. Complete this exercise __________ times per day.  Document Released: 08/29/2005 Document Revised: 01/07/2012 Document Reviewed: 01/27/2009 [facility] Patient Information 2015 [place], [place]. This information is not intended to replace advice given to you by your health care provider. Make sure you discuss any questions you have with your health care provider.

## 2014-09-16 NOTE — Progress Notes (Addendum)
HPI A 63 y.o. Caucasian female presents for right shoulder pain that started 2 weeks ago.  She denies any injury or trauma to the joint.  She describes the pain as dull and achy (like a tooth ache) and is a 4 out of 10 right now.  She states the Ibuprofen (2 tablets per day) helps with the mild pain and nothing else helps.  She states the pain is aggravated by constant movements and certain positions.  She states the pain radiates up into the right side of her neck and down her right arm into her fingers.  She states the pain gets worse as the day progresses.  She reports headaches, numbness, tingling.  She denies decreased ROM and muscle weakness.    Review of Systems: [X]  = complains of  [ ]  = denies General: Fatigue [ ]  Fever [ ]  Chills [ ]  Weakness [ ]   Insomnia [ ]  Eyes: Redness [ ]  Blurred vision [ ]  Diplopia [ ]   ENT: Congestion [ ]  Sinus Pain [ ]  Post Nasal Drip [ ]  Sore Throat [ ]  Earache [ ]   Cardiac: Chest pain/pressure [ ]  SOB [ ]  Orthopnea [ ]   Palpitations [ ]   Paroxysmal nocturnal dyspnea[ ]  Claudication [ ]  Edema [ ]   Pulmonary: Cough [ ]  Wheezing[ ]   SOB [ ]   Snoring [ ]   GI: Nausea [ ]  Vomiting[ ]  Dysphagia[ ]  Heartburn[ ]  Abdominal pain [ ]  Constipation [ ] ; Diarrhea [ ] ; BRBPR [ ]  Melena[ ]  GU: Hematuria[ ]  Dysuria [ ]  Nocturia[ ]  Urgency [ ]   Hesitancy [ ]  Discharge [ ]  Neuro: Headaches[X ] Vertigo[ ]  Paresthesias[ ]  Spasm [ ]  Speech changes [ ]  Incoordination [ ]  Numbness [ X]  Tingling Valu.Nieves ] Ortho: Arthritis Valu.Nieves ] Joint pain [ X] Muscle pain [ ]  Joint swelling [ ]  Back Pain [ ]  Skin:  Rash [ ]   Pruritis [ ]  Change in skin lesion [ ]   Psych: Depression[ ]  Anxiety[ ]  Confusion [ ]  Memory loss [ ]   Heme/Lypmh: Bleeding [ ]  Bruising [ ]  Enlarged lymph nodes [ ]   Endocrine: Visual blurring [ ]  Paresthesia [ ]  Polyuria [ ]  Polydypsea [ ]    Heat/cold intolerance [ ]  Hypoglycemia [ ]   Current Medications: Current Outpatient Prescriptions on File Prior to Visit  Medication Sig  Dispense Refill  . acetaminophen (TYLENOL) 500 MG tablet Take 500 mg by mouth every 6 (six) hours as needed. For pain    . aspirin 81 MG tablet Take 81 mg by mouth daily.    . Biotin 10 MG CAPS Take 1 capsule by mouth daily.    . Calcium Citrate-Vitamin D (CALCIUM CITRATE + D3 PO) Take 500 mg by mouth 3 (three) times daily.    . Cholecalciferol (VITAMIN D3) 50000 UNITS CAPS Take 1 capsule by mouth 2 (two) times a week.     . diphenhydramine-acetaminophen (TYLENOL PM) 25-500 MG TABS Take 1 tablet by mouth at bedtime.     . fish oil-omega-3 fatty acids 1000 MG capsule Take 1 g by mouth daily.    . hydrochlorothiazide (HYDRODIURIL) 25 MG tablet TAKE ONE TABLET BY MOUTH ONCE DAILY FOR BLOOD PRESSURE AND FLUID 90 tablet 0  . levothyroxine (SYNTHROID, LEVOTHROID) 75 MCG tablet TAKE ONE TABLET BY MOUTH ONCE DAILY 90 tablet 0  . potassium gluconate 595 MG TABS Take 595 mg by mouth daily.     . pravastatin (PRAVACHOL) 40 MG tablet Take 40 mg by mouth daily. 1/2 =  20 mg QOD    . vitamin B-12 (CYANOCOBALAMIN) 1000 MCG tablet Take 1,000 mcg by mouth 2 (two) times a week.     . Vitamin D, Ergocalciferol, (DRISDOL) 50000 UNITS CAPS capsule      No current facility-administered medications on file prior to visit.   Allergies:  Allergies  Allergen Reactions  . Codeine Other (See Comments)    "puts me out of it"  . Nabumetone     GI upset   Medical History:  Past Medical History  Diagnosis Date  . Hypertension     no treatment after 120 lb weight loss  . Hyperlipidemia   . Hypothyroidism   . Thyroid nodule   . Allergic rhinitis   . Arthritis   . Cholecystitis, chronic   . Dysrhythmia     Hx bradycardia - hx normal cardiac cath x2 / ECHO 2009 EF 60% LAE MYOVIEW 3/12 EF 60% NO ISCHEMIA  . Prediabetes    Surgical History:  Past Surgical History  Procedure Laterality Date  . Bilateral carpal tunnel surgery    . Replacement total knee bilateral      bilat  . Nasal sinus surgery    . Tubal  ligation    . Breath tek h pylori  07/01/2012    Procedure: BREATH TEK H PYLORI;  Surgeon: Edward Jolly, MD;  Location: [facility];  Service: General;  Laterality: N/A;  . 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 Cholecystectomy N/A 11/27/2013    Procedure: LAPAROSCOPIC CHOLECYSTECTOMY WITH INTRAOPERATIVE CHOLANGIOGRAM;  Surgeon: Pedro Earls, MD;  Location: [facility];  Service: General;  Laterality: N/A;   Social History:   History  Substance Use Topics  . Smoking status: Former Smoker -- 0.80 packs/day for 10 years    Types: Cigarettes    Quit date: 10/30/1979  . Smokeless tobacco: Not on file  . Alcohol Use: No   Physical Exam: BP 126/64 mmHg  Pulse 55  Temp(Src) 100 F (37.8 C)  Resp 18  Wt 149 lb (67.586 kg)  SpO2 99% Wt Readings from Last 3 Encounters:  09/16/14 149 lb (67.586 kg)  09/02/14 150 lb 6.4 oz (68.221 kg)  06/02/14 150 lb (68.04 kg)  Vitals Reviewed. General Appearance: Well nourished, in no apparent distress and had pleasant demeanor. Neck: Full range of motion in neck intact Respiratory:  Chest wall expansion symmetrical.   CTAB,  r/r/w, No stridor.   No increased effort of breathing. Cardio: RRR.   m/r/g.  S1S2nl.  PMI non-displaced.  Musculoskeletal:  Rightdistal neuromuscular exam negative, positive for impingement sign and sensory exam normal.  Bicipital groove with mild tenderness on the right.  Good distal pulses, cap refill, and sensation intact.   Full ROM in upper left extremity.  Deceased ROM in right upper extremity with forward flexion, extension, abduction, adduction, external rotation and internal rotation when compared to the left upper extremity.   Strength is abnormal  in the right arm compared to left arm.  Left arm is 5/5 and right arm is 4/5.  Brachioradialis  reflexes are normal bilaterally. Bicep reflexes are normal bilaterally. Tricep reflexes are normal bilaterally.  Piano Key Test: Negative  Apley's Scratch Test: Positive on right with abduction and external rotation and adduction and internal rotation. Empty Can Test: Positive on the right Yergason's Test: Mild  tenderness on the right O'Brien's Test: Positive on the right Hawkin's-Kennedy Test: Positive on right  Assessment and Plan 1. Right shoulder pain- Possible rotator cuff impingement? Tear? Tendonitis? - DG Shoulder Right; Future-Go to [facility] hospital to get x-ray.  Will call with results. - Take the Tramadol with food for severe pain.  Take 1/2 to 1 tablet every 6  hours as needed for shoulder pain.-  traMADol (ULTRAM) 50 MG tablet; Take 1/2 tablet to 1 tablet PO every 6 hours as needed for pain.  MDD: 4 tablets  Dispense: 60 tablet; Refill: 0 -Take Ibuprofen for mild to moderate pain. -Rest and ice or heat will help with pain. -Gave pt handout on exercises to do.  Discussed medication effects and SE's.  Pt agreed to treatment plan. Please follow up in one month  Desiray Orchard, Stephani Police, PA-C 1:28 PM [facility]

## 2014-09-18 NOTE — Addendum Note (Signed)
Addended by: Charolette Forward on: 09/18/2014 12:03 PM   Modules accepted: Miquel Dunn

## 2014-09-30 ENCOUNTER — Other Ambulatory Visit: Payer: Self-pay | Admitting: *Deleted

## 2014-09-30 MED ORDER — HYDROCHLOROTHIAZIDE 25 MG PO TABS: ORAL_TABLET | ORAL | Status: DC

## 2014-10-20 ENCOUNTER — Ambulatory Visit (INDEPENDENT_AMBULATORY_CARE_PROVIDER_SITE_OTHER): Payer: Federal, State, Local not specified - PPO | Admitting: Physician Assistant

## 2014-10-20 ENCOUNTER — Encounter: Payer: Self-pay | Admitting: Physician Assistant

## 2014-10-20 VITALS — BP 118/70 | Temp 97.8°F | Ht 64.0 in | Wt 152.0 lb

## 2014-10-20 DIAGNOSIS — R197 Diarrhea, unspecified: Secondary | ICD-10-CM

## 2014-10-20 DIAGNOSIS — J0100 Acute maxillary sinusitis, unspecified: Secondary | ICD-10-CM

## 2014-10-20 DIAGNOSIS — M25511 Pain in right shoulder: Secondary | ICD-10-CM

## 2014-10-20 MED ORDER — PREDNISONE 10 MG PO TABS: ORAL_TABLET | ORAL | Status: AC

## 2014-10-20 MED ORDER — TRAMADOL HCL 50 MG PO TABS: ORAL_TABLET | ORAL | Status: DC

## 2014-10-20 NOTE — Patient Instructions (Addendum)
-Continue tramadol as prescribed for pain. Please call [facility] for appt for right shoulder pain.  Please pay attention to which foods you eat and then get the abdominal pain. Can take Imodium OTC for diarrhea if it continues.  Stp Imodium once bowels are no longer diarrhea.   Please keep your appt in February 2016 for regular follow up.  Irritable Bowel Syndrome Irritable bowel syndrome (IBS) is caused by a disturbance of normal bowel function and is a common digestive disorder. You may also hear this condition called spastic colon, mucous colitis, and irritable colon. There is no cure for IBS. However, symptoms often gradually improve or disappear with a good diet, stress management, and medicine. This condition usually appears in late adolescence or early adulthood. Women develop it twice as often as men. CAUSES  After food has been digested and absorbed in the small intestine, waste material is moved into the large intestine, or colon. In the colon, water and salts are absorbed from the undigested products coming from the small intestine. The remaining residue, or fecal material, is held for elimination. Under normal circumstances, gentle, rhythmic contractions of the bowel walls push the fecal material along the colon toward the rectum. In IBS, however, these contractions are irregular and poorly coordinated. The fecal material is either retained too long, resulting in constipation, or expelled too soon, producing diarrhea. SIGNS AND SYMPTOMS  The most common symptom of IBS is abdominal pain. It is often in the lower left side of the abdomen, but it may occur anywhere in the abdomen. The pain comes from spasms of the bowel muscles happening too much and from the buildup of gas and fecal material in the colon. This pain:  Can range from sharp abdominal cramps to a dull, continuous ache.  Often worsens soon after eating.  Is often relieved by having a bowel movement or passing  gas. Abdominal pain is usually accompanied by constipation, but it may also produce diarrhea. The diarrhea often occurs right after a meal or upon waking up in the morning. The stools are often soft, watery, and flecked with mucus. Other symptoms of IBS include:  Bloating.  Loss of appetite.  Heartburn.  Backache.  Dull pain in the arms or shoulders.  Nausea.  Burping.  Vomiting.  Gas. IBS may also cause symptoms that are unrelated to the digestive system, such as:  Fatigue.  Headaches.  Anxiety.  Shortness of breath.  Trouble concentrating.  Dizziness. These symptoms tend to come and go. DIAGNOSIS  The symptoms of IBS may seem like symptoms of other, more serious digestive disorders. Your health care provider may want to perform tests to exclude these disorders.  TREATMENT Many medicines are available to help correct bowel function or relieve bowel spasms and abdominal pain. Among the medicines available are:  Laxatives for severe constipation and to help restore normal bowel habits.  Specific antidiarrheal medicines to treat severe or lasting diarrhea.  Antispasmodic agents to relieve intestinal cramps. Your health care provider may also decide to treat you with a mild tranquilizer or sedative during unusually stressful periods in your life. Your health care provider may also prescribe antidepressant medicine. The use of this medicine has been shown to reduce pain and other symptoms of IBS. Remember that if any medicine is prescribed for you, you should take it exactly as directed. Make sure your health care provider knows how well it worked for you. HOME CARE INSTRUCTIONS   Take all medicines as directed by your health  care provider.  Avoid foods that are high in fat or oils, such as heavy cream, butter, frankfurters, sausage, and other fatty meats.  Avoid foods that make you go to the bathroom, such as fruit, fruit juice, and dairy products.  Cut out  carbonated drinks, chewing gum, and "gassy" foods such as beans and cabbage. This may help relieve bloating and burping.  Eat foods with bran, and drink plenty of liquids with the bran foods. This helps relieve constipation.  Keep track of what foods seem to bring on your symptoms.  Avoid emotionally charged situations or circumstances that produce anxiety.  Start or continue exercising.  Get plenty of rest and sleep. Document Released: 10/15/2005 Document Revised: 10/20/2013 Document Reviewed: 06/04/2008 [employer] Patient Information 2015 [place], [place]. This information is not intended to replace advice given to you by your health care provider. Make sure you discuss any questions you have with your health care provider.   Diet and Irritable Bowel Syndrome  No cure has been found for irritable bowel syndrome (IBS). Many options are available to treat the symptoms. Your caregiver will give you the best treatments available for your symptoms. He or she will also encourage you to manage stress and to make changes to your diet. You need to work with your caregiver and Registered Dietician to find the best combination of medicine, diet, counseling, and support to control your symptoms. The following are some diet suggestions. FOODS THAT MAKE IBS WORSE  Fatty foods, such as [place] fries.  Milk products, such as cheese or ice cream.  Chocolate.  Alcohol.  Caffeine (found in coffee and some sodas).  Carbonated drinks, such as soda. If certain foods cause symptoms, you should eat less of them or stop eating them. FOOD JOURNAL   Keep a journal of the foods that seem to cause distress. Write down:  What you are eating during the day and when.  What problems you are having after eating.  When the symptoms occur in relation to your meals.  What foods always make you feel badly.  Take your notes with you to your caregiver to see if you should stop eating certain foods. FOODS THAT MAKE  IBS BETTER Fiber reduces IBS symptoms, especially constipation, because it makes stools soft, bulky, and easier to pass. Fiber is found in bran, bread, cereal, beans, fruit, and vegetables. Examples of foods with fiber include:  Apples.  Peaches.  Pears.  Berries.  Figs.  Broccoli, raw.  Cabbage.  Carrots.  Raw peas.  Kidney beans.  Lima beans.  Whole-grain bread.  Whole-grain cereal. Add foods with fiber to your diet a little at a time. This will let your body get used to them. Too much fiber at once might cause gas and swelling of your abdomen. This can trigger symptoms in a person with IBS. Caregivers usually recommend a diet with enough fiber to produce soft, painless bowel movements. High fiber diets may cause gas and bloating. However, these symptoms often go away within a few weeks, as your body adjusts. In many cases, dietary fiber may lessen IBS symptoms, particularly constipation. However, it may not help pain or diarrhea. High fiber diets keep the colon mildly enlarged (distended) with the added fiber. This may help prevent spasms in the colon. Some forms of fiber also keep water in the stool, thereby preventing hard stools that are difficult to pass.  Besides telling you to eat more foods with fiber, your caregiver may also tell you to get more fiber  by taking a fiber pill or drinking water mixed with a special high fiber powder. An example of this is a natural fiber laxative containing psyllium seed.  TIPS  Large meals can cause cramping and diarrhea in people with IBS. If this happens to you, try eating 4 or 5 small meals a day, or try eating less at each of your usual 3 meals. It may also help if your meals are low in fat and high in carbohydrates. Examples of carbohydrates are pasta, rice, whole-grain breads and cereals, fruits, and vegetables.  If dairy products cause your symptoms to flare up, you can try eating less of those foods. You might be able to handle  yogurt better than other dairy products, because it contains bacteria that helps with digestion. Dairy products are an important source of calcium and other nutrients. If you need to avoid dairy products, be sure to talk with a Registered Dietitian about getting these nutrients through other food sources.  Drink enough water and fluids to keep your urine clear or pale yellow. This is important, especially if you have diarrhea. FOR MORE INFORMATION  International Foundation for Functional Gastrointestinal Disorders: www.iffgd.org  National Digestive Diseases Information Clearinghouse: digestive.AmenCredit.is Document Released: 01/05/2004 Document Revised: 01/07/2012 Document Reviewed: 01/15/2014 [facility] Patient Information 2015 [place], [place]. This information is not intended to replace advice given to you by your health care provider. Make sure you discuss any questions you have with your health care provider.

## 2014-10-20 NOTE — Progress Notes (Signed)
HPI A 63yo Caucasian female presents for follow up today for right shoulder pain.  Patient states the pain comes and goes and her pain level right now is a 3 out of 10.  Patient states it is still the dull achy pain that does radiate into right side of neck.  She states she did have improvement with taking the Tramadol.  Patient states she took the tramadol without any complications or side effects.  Right Shoulder X-Ray on 09/16/14 came back negative.  Patient states she has seen [facility] before for knee replacements and will refer patient to them for right shoulder pain.    Patient states she is also having sinus issues.  She states it started a couple days ago.  She reports frontal headache that comes and goes.  Patient reports sinus pressure and drainage.  She is also reporting generalized abdominal pain and loose stools.  Patient states she has tried sinus medication over the counter with no relief.  Last Visit 09/16/14: A 63 y.o. Caucasian female presents for right shoulder pain that started 2 weeks ago.  She denies any injury or trauma to the joint.  She describes the pain as dull and achy (like a tooth ache) and is a 4 out of 10 right now.  She states the Ibuprofen (2 tablets per day) helps with the mild pain and nothing else helps.  She states the pain is aggravated by constant movements and certain positions.  She states the pain radiates up into the right side of her neck and down her right arm into her fingers.  She states the pain gets worse as the day progresses.  She reports headaches, numbness, tingling.  She denies decreased ROM and muscle weakness.   GFR= 80 on 09/02/14 Review of Systems  Constitutional: Negative.  Negative for fever, chills, malaise/fatigue and diaphoresis.  HENT: Positive for congestion. Negative for ear discharge, ear pain and sore throat.        Sinus pressure, headache, drainage.  Eyes: Negative.   Respiratory: Negative.  Negative for cough, shortness of  breath and wheezing.   Cardiovascular: Negative.  Negative for chest pain.  Gastrointestinal: Positive for abdominal pain.       Stools more "runny or loose" and having abdominal pain and throughout stomach.  Sometimes after eating and feels better after bowel movement.    Genitourinary: Negative.   Musculoskeletal: Positive for joint pain and neck pain.       Right Shoulder Pain  Skin: Negative.  Negative for rash.  Neurological: Positive for dizziness and headaches.  Psychiatric/Behavioral: Negative.    Current Medications: Current Outpatient Prescriptions on File Prior to Visit  Medication Sig Dispense Refill  . acetaminophen (TYLENOL) 500 MG tablet Take 500 mg by mouth every 6 (six) hours as needed. For pain    . aspirin 81 MG tablet Take 81 mg by mouth daily.    . Biotin 10 MG CAPS Take 1 capsule by mouth daily.    . Calcium Citrate-Vitamin D (CALCIUM CITRATE + D3 PO) Take 500 mg by mouth 3 (three) times daily.    . Cholecalciferol (VITAMIN D3) 50000 UNITS CAPS Take 1 capsule by mouth 2 (two) times a week.     . diphenhydramine-acetaminophen (TYLENOL PM) 25-500 MG TABS Take 1 tablet by mouth at bedtime.     . fish oil-omega-3 fatty acids 1000 MG capsule Take 1 g by mouth daily.    . hydrochlorothiazide (HYDRODIURIL) 25 MG tablet TAKE ONE TABLET BY MOUTH  ONCE DAILY FOR BLOOD PRESSURE AND FLUID 90 tablet 4  . levothyroxine (SYNTHROID, LEVOTHROID) 75 MCG tablet TAKE ONE TABLET BY MOUTH ONCE DAILY 90 tablet 0  . potassium gluconate 595 MG TABS Take 595 mg by mouth daily.     . pravastatin (PRAVACHOL) 40 MG tablet Take 40 mg by mouth daily. 1/2 = 20 mg QOD.  Patient states she is taking every other day.    . vitamin B-12 (CYANOCOBALAMIN) 1000 MCG tablet Take 1,000 mcg by mouth 2 (two) times a week.     . Vitamin D, Ergocalciferol, (DRISDOL) 50000 UNITS CAPS capsule      No current facility-administered medications on file prior to visit.   Allergies:  Allergies  Allergen Reactions  .  Codeine Other (See Comments)    "puts me out of it".  Dysphoria.  Patient tolerates Tramadol without complications.  . Nabumetone     GI upset   Medical History:  Past Medical History  Diagnosis Date  . Hypertension     no treatment after 120 lb weight loss  . Hyperlipidemia   . Hypothyroidism   . Thyroid nodule   . Allergic rhinitis   . Arthritis   . Cholecystitis, chronic   . Dysrhythmia     Hx bradycardia - hx normal cardiac cath x2 / ECHO 2009 EF 60% LAE MYOVIEW 3/12 EF 60% NO ISCHEMIA  . Prediabetes    Surgical History:  Past Surgical History  Procedure Laterality Date  . Bilateral carpal tunnel surgery    . Replacement total knee bilateral      bilat  . Nasal sinus surgery    . Tubal ligation    . Breath tek h pylori  07/01/2012    Procedure: BREATH TEK H PYLORI;  Surgeon: Edward Jolly, MD;  Location: [facility];  Service: General;  Laterality: N/A;  . 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 Cholecystectomy N/A 11/27/2013    Procedure: LAPAROSCOPIC CHOLECYSTECTOMY WITH INTRAOPERATIVE CHOLANGIOGRAM;  Surgeon: Pedro Earls, MD;  Location: [facility];  Service: General;  Laterality: N/A;   Social History:   History  Substance Use Topics  . Smoking status: Former Smoker -- 0.80 packs/day for 10 years    Types: Cigarettes    Quit date: 10/30/1979  . Smokeless tobacco: Not on file  . Alcohol Use: No   Physical Exam: BP 118/70 mmHg  Temp(Src) 97.8 F (36.6 C) (Temporal)  Ht 5\' 4"  (1.626 m)  Wt 152 lb (68.947 kg)  BMI 26.08 kg/m2 Wt Readings from Last 3 Encounters:  10/20/14 152 lb (68.947 kg)  09/16/14 149 lb (67.586 kg)  09/02/14 150 lb 6.4 oz (68.221 kg)  Vitals Reviewed. General Appearance: Well nourished, in no apparent distress and had pleasant demeanor. Eyes:  PERRLA. EOMI.  Conjunctiva is pink without swelling, redness or yellowing. No scleral icterus.  Sinuses: Frontal and maxillary tenderness bilaterally. Ears: No redness, swelling or tenderness on both external ear cartilages and ear canals.  TMs are intact bilaterally with normal light reflexes and bulging with clear fluid.    TMs non-erythematous and non-edematous bilaterally. Nose: Nose is symmetrical and turbinates are pink and not red, pale or swollen.  No polyps, tenderness or rhinorrhea. Throat: Oral pharynx is pink and moist.  Mild erythema in posterior pharynx.  No swelling or tenderness in posterior  pharynx.  Mucosa is intact and without lesions.  Tonsils are at +2 station bilaterally and do not have exudate.    Uvula is midline and not swollen. Neck: Full range of motion in neck intact Respiratory: Chest wall expansion symmetrical.  CTAB,  r/r/w or stridor.  No increased effort of breathing. Cardio: RRR.   m/r/g.  S1S2nl.    Musculoskeletal and Neuro: Alert and oriented X3, cooperative. Mood and affect appropriate to situation.  CN II-XII grossly intact.  Right distal neuromuscular exam negative, positive for impingement sign and sensory exam normal.  Bicipital groove with mild tenderness on the right.  Distal pulses +2, capillary refill normal, and sensation intact.   Full ROM in upper left extremity.  Deceased ROM in right upper extremity with forward flexion, extension, abduction, adduction, external rotation and internal rotation when compared to the left upper extremity.   Strength is abnormal  in the right arm compared to left arm.  Left arm is 5/5 and right arm is 4/5.  Brachioradialis reflexes are normal bilaterally. Bicep reflexes are normal bilaterally.  Piano Key Test: Negative  Apley's Scratch Test: Positive on right with abduction and external rotation and adduction and internal rotation. Empty Can Test: Positive on the right Yergason's Test: Mild tenderness on the right O'Brien's  Test: Positive on the right Hawkin's-Kennedy Test: Positive on right Psych: Insight and Judgment appropriate.   Assessment and Plan 1. Right shoulder pain- Possible rotator cuff impingement? Tear? Tendonitis? -Continue Tramadol as prescribed- traMADol (ULTRAM) 50 MG tablet; Take 1/2 tablet to 1 tablet PO every 6 hours as needed for pain.  MDD: 4 tablets  Dispense: 120 tablet; Refill: 0 Please call [facility] for right shoulder pain.  Please let me know if I need to send them a referral.  2. Acute maxillary sinusitis, recurrence not specified -Take Prednisone as prescribed for inflammation- predniSONE (DELTASONE) 10 MG tablet; Take 3 tablets PO for 2 days, then take 2 tablets PO for 2 days, then take 1 tablet PO for 3 days.  Dispense: 13 tablet; Refill: 0 -Take Claritin, Zyrtec or Allegra OTC (Choose One)- 1 tablet daily.  3. Diarrhea- Possible IBS? Please pay attention to which foods you are eating and if the diarrhea and abdominal cramps are occuring after those foods. Gave patient info about IBS.  Discussed medication effects and SE's.  Pt agreed to treatment plan. Please keep your follow up appt on 12/15/14 for regular follow up with labs.  Konner Saiz, Stephani Police, PA-C 11:20 AM [facility]

## 2014-10-24 ENCOUNTER — Other Ambulatory Visit: Payer: Self-pay | Admitting: Internal Medicine

## 2014-12-15 ENCOUNTER — Ambulatory Visit (INDEPENDENT_AMBULATORY_CARE_PROVIDER_SITE_OTHER): Payer: Federal, State, Local not specified - PPO | Admitting: Physician Assistant

## 2014-12-15 ENCOUNTER — Ambulatory Visit: Payer: Self-pay | Admitting: Physician Assistant

## 2014-12-15 ENCOUNTER — Encounter: Payer: Self-pay | Admitting: Physician Assistant

## 2014-12-15 VITALS — BP 128/68 | HR 52 | Temp 98.6°F | Resp 16 | Ht 64.0 in | Wt 147.0 lb

## 2014-12-15 DIAGNOSIS — E039 Hypothyroidism, unspecified: Secondary | ICD-10-CM

## 2014-12-15 DIAGNOSIS — E785 Hyperlipidemia, unspecified: Secondary | ICD-10-CM

## 2014-12-15 DIAGNOSIS — R7309 Other abnormal glucose: Secondary | ICD-10-CM

## 2014-12-15 DIAGNOSIS — Z79899 Other long term (current) drug therapy: Secondary | ICD-10-CM

## 2014-12-15 DIAGNOSIS — E559 Vitamin D deficiency, unspecified: Secondary | ICD-10-CM

## 2014-12-15 DIAGNOSIS — I10 Essential (primary) hypertension: Secondary | ICD-10-CM

## 2014-12-15 DIAGNOSIS — R7303 Prediabetes: Secondary | ICD-10-CM

## 2014-12-15 LAB — CBC WITH DIFFERENTIAL/PLATELET
Basophils Absolute: 0 10*3/uL (ref 0.0–0.1)
Basophils Relative: 1 % (ref 0–1)
EOS ABS: 0.2 10*3/uL (ref 0.0–0.7)
Eosinophils Relative: 5 % (ref 0–5)
HEMATOCRIT: 36.6 % (ref 36.0–46.0)
Hemoglobin: 12 g/dL (ref 12.0–15.0)
Lymphocytes Relative: 62 % — ABNORMAL HIGH (ref 12–46)
Lymphs Abs: 2.9 10*3/uL (ref 0.7–4.0)
MCH: 30.4 pg (ref 26.0–34.0)
MCHC: 32.8 g/dL (ref 30.0–36.0)
MCV: 92.7 fL (ref 78.0–100.0)
MONO ABS: 0.3 10*3/uL (ref 0.1–1.0)
MONOS PCT: 6 % (ref 3–12)
MPV: 10 fL (ref 8.6–12.4)
Neutro Abs: 1.2 10*3/uL — ABNORMAL LOW (ref 1.7–7.7)
Neutrophils Relative %: 26 % — ABNORMAL LOW (ref 43–77)
Platelets: 242 10*3/uL (ref 150–400)
RBC: 3.95 MIL/uL (ref 3.87–5.11)
RDW: 13.6 % (ref 11.5–15.5)
WBC: 4.6 10*3/uL (ref 4.0–10.5)

## 2014-12-15 LAB — MAGNESIUM: Magnesium: 2 mg/dL (ref 1.5–2.5)

## 2014-12-15 LAB — BASIC METABOLIC PANEL WITH GFR
BUN: 17 mg/dL (ref 6–23)
CALCIUM: 9.3 mg/dL (ref 8.4–10.5)
CO2: 28 mEq/L (ref 19–32)
Chloride: 103 mEq/L (ref 96–112)
Creat: 0.85 mg/dL (ref 0.50–1.10)
GFR, EST AFRICAN AMERICAN: 85 mL/min
GFR, EST NON AFRICAN AMERICAN: 74 mL/min
Glucose, Bld: 88 mg/dL (ref 70–99)
POTASSIUM: 3.8 meq/L (ref 3.5–5.3)
SODIUM: 139 meq/L (ref 135–145)

## 2014-12-15 LAB — HEPATIC FUNCTION PANEL
ALT: 15 U/L (ref 0–35)
AST: 22 U/L (ref 0–37)
Albumin: 3.8 g/dL (ref 3.5–5.2)
Alkaline Phosphatase: 84 U/L (ref 39–117)
BILIRUBIN DIRECT: 0.4 mg/dL — AB (ref 0.0–0.3)
BILIRUBIN INDIRECT: 1.4 mg/dL — AB (ref 0.2–1.2)
BILIRUBIN TOTAL: 1.8 mg/dL — AB (ref 0.2–1.2)
Total Protein: 6.1 g/dL (ref 6.0–8.3)

## 2014-12-15 LAB — LIPID PANEL
CHOL/HDL RATIO: 2 ratio
Cholesterol: 141 mg/dL (ref 0–200)
HDL: 69 mg/dL (ref >39)
LDL Cholesterol: 61 mg/dL (ref 0–99)
Triglycerides: 57 mg/dL (ref <150)
VLDL: 11 mg/dL (ref 0–40)

## 2014-12-15 NOTE — Progress Notes (Signed)
Assessment and Plan:  Hypertension: Continue medication, monitor blood pressure at home. Continue DASH diet.  Reminder to go to the ER if any CP, SOB, nausea, dizziness, severe HA, changes vision/speech, left arm numbness and tingling, and jaw pain. Cholesterol: Continue diet and exercise. Check cholesterol.  Pre-diabetes-Continue diet and exercise. Check A1C Vitamin D Def- check level and continue medications.  Right shoulder pain- ? From neck- taught exercises, declines injection/Pt at this time.  Left leg numbness- normal neuro exam- ? DDD/SI- exercises given.   Continue diet and meds as discussed. Further disposition pending results of labs.  HPI 64 y.o. female  presents for 3 month follow up with hypertension, hyperlipidemia, prediabetes and vitamin D, she had gastric bypass and has done a great job with weight loss getting off her meds.   Her blood pressure has been controlled at home, she is on HCTZ with potassium and Mag pills, today their BP is BP: 128/68 mmHg  She does not workout. She denies chest pain, shortness of breath, dizziness.  She is on cholesterol medication, pravastatin 40mg  1/2 pill every other day and denies myalgias. Her cholesterol is at goal. The cholesterol last visit was:   Lab Results  Component Value Date   CHOL 127 09/02/2014   HDL 62 09/02/2014   LDLCALC 53 09/02/2014   TRIG 59 09/02/2014   CHOLHDL 2.0 09/02/2014    She has been working on diet and exercise for prediabetes, and denies paresthesia of the feet, polydipsia, polyuria and visual disturbances. Last A1C in the office was:  Lab Results  Component Value Date   HGBA1C 5.8* 09/02/2014   Patient is on Vitamin D supplement, on 50,000 QOD.   Lab Results  Component Value Date   VD25OH 64* 09/02/2014     She is on thyroid medication. Her medication was not changed last visit.   Lab Results  Component Value Date   TSH 1.345 09/02/2014  .  She has had right shoulder pain for 3-4 months, had Xray  that was normal. She also has a history of left leg pain/numbness tingling, Patient denies fever, hematuria, incontinence, weakness and saddle anesthesia   Current Medications:  Current Outpatient Prescriptions on File Prior to Visit  Medication Sig Dispense Refill  . aspirin 81 MG tablet Take 81 mg by mouth daily.    . Calcium Citrate-Vitamin D (CALCIUM CITRATE + D3 PO) Take 600 mg by mouth daily.     . fish oil-omega-3 fatty acids 1000 MG capsule Take 1 g by mouth daily.    . hydrochlorothiazide (HYDRODIURIL) 25 MG tablet TAKE ONE TABLET BY MOUTH ONCE DAILY FOR BLOOD PRESSURE AND FLUID 90 tablet 4  . levothyroxine (SYNTHROID, LEVOTHROID) 75 MCG tablet TAKE ONE TABLET BY MOUTH ONCE DAILY 90 tablet 99  . Magnesium 250 MG TABS Take by mouth daily.    . potassium gluconate 595 MG TABS Take 595 mg by mouth daily.     . pravastatin (PRAVACHOL) 40 MG tablet Take 40 mg by mouth daily. 1/2 = 20 mg QOD.  Patient states she is taking every other day.    . vitamin B-12 (CYANOCOBALAMIN) 1000 MCG tablet Take 1,000 mcg by mouth 2 (two) times a week.      No current facility-administered medications on file prior to visit.   Medical History:  Past Medical History  Diagnosis Date  . Hypertension     no treatment after 120 lb weight loss  . Hyperlipidemia   . Hypothyroidism   . Thyroid  nodule   . Allergic rhinitis   . Arthritis   . Cholecystitis, chronic   . Dysrhythmia     Hx bradycardia - hx normal cardiac cath x2 / ECHO 2009 EF 60% LAE MYOVIEW 3/12 EF 60% NO ISCHEMIA  . Prediabetes    Allergies:  Allergies  Allergen Reactions  . Codeine Other (See Comments)    "puts me out of it".  Dysphoria.  Patient tolerates Tramadol without complications.  . Nabumetone     GI upset     Review of Systems:  Review of Systems  Constitutional: Negative.   HENT: Negative.   Eyes: Negative.   Respiratory: Negative.   Cardiovascular: Negative.   Gastrointestinal: Negative.   Genitourinary: Negative.    Musculoskeletal: Positive for myalgias, back pain, joint pain and neck pain. Negative for falls.  Skin: Negative.   Neurological: Negative.   Endo/Heme/Allergies: Negative.   Psychiatric/Behavioral: Negative.     Family history- Review and unchanged Social history- Review and unchanged Physical Exam: BP 128/68 mmHg  Pulse 52  Temp(Src) 98.6 F (37 C)  Resp 16  Ht 5\' 4"  (1.626 m)  Wt 147 lb (66.679 kg)  BMI 25.22 kg/m2 Wt Readings from Last 3 Encounters:  12/15/14 147 lb (66.679 kg)  10/20/14 152 lb (68.947 kg)  09/16/14 149 lb (67.586 kg)   General Appearance: Well nourished, in no apparent distress. Eyes: PERRLA, EOMs, conjunctiva no swelling or erythema Sinuses: No Frontal/maxillary tenderness ENT/Mouth: Ext aud canals clear, TMs without erythema, bulging. No erythema, swelling, or exudate on post pharynx.  Tonsils not swollen or erythematous. Hearing normal.  Neck: Supple, thyroid normal.  Respiratory: Respiratory effort normal, BS equal bilaterally without rales, rhonchi, wheezing or stridor.  Cardio: RRR with no MRGs. Brisk peripheral pulses without edema.  Abdomen: Soft, + BS,  Non tender, no guarding, rebound, hernias, masses. Lymphatics: Non tender without lymphadenopathy.  Musculoskeletal: Full ROM, 5/5 strength, Normal gait. Negative straight leg raise, good sensation/pulses/strength bilateral lower legs. Right neck with + paraspinus muscle tenderness, right shoulder with mild pain full abduction, otherwise normal.  Skin: Warm, dry without rashes, lesions, ecchymosis.  Neuro: Cranial nerves intact. Normal muscle tone, no cerebellar symptoms. Psych: Awake and oriented X 3, normal affect, Insight and Judgment appropriate.    Vicie Mutters, PA-C 10:51 AM [facility]

## 2014-12-15 NOTE — Patient Instructions (Signed)
Please pick one of the over the counter allergy medications below and take it once daily for allergies.  Claritin or loratadine cheapest but likely the weakest  Zyrtec or certizine at night because it can make you sleepy The strongest is allegra or fexafinadine  Cheapest at [employer], [employer], [employer]  Cervical Radiculopathy Cervical radiculopathy means a nerve in the neck is pinched or bruised. This can cause pain or loss of feeling (numbness) that runs from your neck to your arm and fingers. HOME CARE   Put ice on the injured or painful area.  Put ice in a plastic bag.  Place a towel between your skin and the bag.  Leave the ice on for 15-20 minutes, 03-04 times a day, or as told by your doctor.  If ice does not help, you can try using heat. Take a warm shower or bath, or use a hot water bottle as told by your doctor.  You may try a gentle neck and shoulder massage.  Use a flat pillow when you sleep.  Only take medicines as told by your doctor.  Keep all physical therapy visits as told by your doctor.  If you are given a soft collar, wear it as told by your doctor. GET HELP RIGHT AWAY IF:   Your pain gets worse and is not controlled with medicine.  You lose feeling or feel weak in your hand, arm, face, or leg.  You have a fever or stiff neck.  You cannot control when you poop or pee (incontinence).  You have trouble with walking, balance, or speaking. MAKE SURE YOU:   Understand these instructions.  Will watch your condition.  Will get help right away if you are not doing well or get worse. Document Released: 10/04/2011 Document Revised: 01/07/2012 Document Reviewed: 10/04/2011 [facility] Patient Information 2015 [place], [place]. This information is not intended to replace advice given to you by your health care provider. Make sure you discuss any questions you have with your health care provider.

## 2014-12-16 LAB — TSH: TSH: 0.752 u[IU]/mL (ref 0.350–4.500)

## 2014-12-16 LAB — VITAMIN D 25 HYDROXY (VIT D DEFICIENCY, FRACTURES): Vit D, 25-Hydroxy: 83 ng/mL (ref 30–100)

## 2014-12-16 LAB — HEMOGLOBIN A1C
HEMOGLOBIN A1C: 5.7 % — AB (ref <5.7)
Mean Plasma Glucose: 117 mg/dL — ABNORMAL HIGH (ref <117)

## 2014-12-29 ENCOUNTER — Other Ambulatory Visit: Payer: Self-pay | Admitting: Physician Assistant

## 2014-12-29 MED ORDER — HYOSCYAMINE SULFATE 0.125 MG SL SUBL: 0.1250 mg | SUBLINGUAL_TABLET | Freq: Four times a day (QID) | SUBLINGUAL | Status: DC | PRN

## 2014-12-29 MED ORDER — ONDANSETRON HCL 4 MG PO TABS: 4.0000 mg | ORAL_TABLET | Freq: Three times a day (TID) | ORAL | Status: DC | PRN

## 2014-12-29 NOTE — Progress Notes (Signed)
Fever, chills, nausea and vomiting with diarrhea for 3 days, nausea better but having weakness and diarrhea will send in zofran, levsin, and instructed to stop HCTZ and do 1/2 gatorade/1/2 water however told to go to UC/ER to check to see if needs fluids/check electrolytes.

## 2014-12-30 ENCOUNTER — Ambulatory Visit (INDEPENDENT_AMBULATORY_CARE_PROVIDER_SITE_OTHER): Payer: Federal, State, Local not specified - PPO | Admitting: Physician Assistant

## 2014-12-30 VITALS — BP 130/78 | HR 56 | Temp 97.9°F | Resp 16 | Wt 144.0 lb

## 2014-12-30 DIAGNOSIS — D6489 Other specified anemias: Secondary | ICD-10-CM

## 2014-12-30 DIAGNOSIS — R1013 Epigastric pain: Secondary | ICD-10-CM

## 2014-12-30 DIAGNOSIS — H8110 Benign paroxysmal vertigo, unspecified ear: Secondary | ICD-10-CM

## 2014-12-30 DIAGNOSIS — I10 Essential (primary) hypertension: Secondary | ICD-10-CM

## 2014-12-30 DIAGNOSIS — E039 Hypothyroidism, unspecified: Secondary | ICD-10-CM

## 2014-12-30 LAB — CBC WITH DIFFERENTIAL/PLATELET
Basophils Absolute: 0 10*3/uL (ref 0.0–0.1)
Basophils Relative: 1 % (ref 0–1)
Eosinophils Absolute: 0.2 10*3/uL (ref 0.0–0.7)
Eosinophils Relative: 4 % (ref 0–5)
HEMATOCRIT: 37.4 % (ref 36.0–46.0)
HEMOGLOBIN: 12.4 g/dL (ref 12.0–15.0)
LYMPHS PCT: 58 % — AB (ref 12–46)
Lymphs Abs: 2.5 10*3/uL (ref 0.7–4.0)
MCH: 30.2 pg (ref 26.0–34.0)
MCHC: 33.2 g/dL (ref 30.0–36.0)
MCV: 91.2 fL (ref 78.0–100.0)
MPV: 9.8 fL (ref 8.6–12.4)
Monocytes Absolute: 0.3 10*3/uL (ref 0.1–1.0)
Monocytes Relative: 8 % (ref 3–12)
NEUTROS ABS: 1.2 10*3/uL — AB (ref 1.7–7.7)
Neutrophils Relative %: 29 % — ABNORMAL LOW (ref 43–77)
Platelets: 301 10*3/uL (ref 150–400)
RBC: 4.1 MIL/uL (ref 3.87–5.11)
RDW: 14 % (ref 11.5–15.5)
WBC: 4.3 10*3/uL (ref 4.0–10.5)

## 2014-12-30 LAB — HEPATIC FUNCTION PANEL
ALBUMIN: 3.9 g/dL (ref 3.5–5.2)
ALK PHOS: 86 U/L (ref 39–117)
ALT: 18 U/L (ref 0–35)
AST: 24 U/L (ref 0–37)
Bilirubin, Direct: 0.2 mg/dL (ref 0.0–0.3)
Indirect Bilirubin: 0.7 mg/dL (ref 0.2–1.2)
TOTAL PROTEIN: 6.3 g/dL (ref 6.0–8.3)
Total Bilirubin: 0.9 mg/dL (ref 0.2–1.2)

## 2014-12-30 LAB — IRON AND TIBC
%SAT: 34 % (ref 20–55)
Iron: 104 ug/dL (ref 42–145)
TIBC: 306 ug/dL (ref 250–470)
UIBC: 202 ug/dL (ref 125–400)

## 2014-12-30 LAB — BASIC METABOLIC PANEL WITH GFR
BUN: 14 mg/dL (ref 6–23)
CO2: 30 mEq/L (ref 19–32)
CREATININE: 0.91 mg/dL (ref 0.50–1.10)
Calcium: 9 mg/dL (ref 8.4–10.5)
Chloride: 103 mEq/L (ref 96–112)
GFR, Est African American: 78 mL/min
GFR, Est Non African American: 68 mL/min
Glucose, Bld: 89 mg/dL (ref 70–99)
POTASSIUM: 3.6 meq/L (ref 3.5–5.3)
Sodium: 141 mEq/L (ref 135–145)

## 2014-12-30 LAB — VITAMIN B12: Vitamin B-12: 1329 pg/mL — ABNORMAL HIGH (ref 211–911)

## 2014-12-30 LAB — TSH: TSH: 0.827 u[IU]/mL (ref 0.350–4.500)

## 2014-12-30 LAB — MAGNESIUM: Magnesium: 2.1 mg/dL (ref 1.5–2.5)

## 2014-12-30 LAB — FERRITIN: Ferritin: 52 ng/mL (ref 10–291)

## 2014-12-30 LAB — AMYLASE: AMYLASE: 43 U/L (ref 0–105)

## 2014-12-30 MED ORDER — MECLIZINE HCL 25 MG PO TABS: 25.0000 mg | ORAL_TABLET | Freq: Three times a day (TID) | ORAL | Status: DC | PRN

## 2014-12-30 NOTE — Progress Notes (Signed)
Subjective:    Patient ID: Tracy Paul, female    DOB: [birth year], 64 y.o.   MRN: [id number]  HPI 64 y.o. 8 pack year smoking, Q 1981  has a past medical history of Hypertension; Hyperlipidemia; Hypothyroidism; Thyroid nodule; Allergic rhinitis; Arthritis; Cholecystitis, chronic; Dysrhythmia; and Prediabetes.and s/p gastric bypass presents with vertigo. She states since 12/15/2014 she has been having vertigo worse with moving, step sideways, headache frontal constant but worse at times, throbbing, vomiting x 1 Saturday. Denies changes in speech, vision, focal extremity weakness. States she has constant sinus issues. Had fever 1 day and has been having chills. Has been taking mucinex, zrtec, tylenol. She quit the tramadol which did not help. She is on bASA.  Lab Results  Component Value Date   WBC 4.6 12/15/2014   HGB 12.0 12/15/2014   HCT 36.6 12/15/2014   MCV 92.7 12/15/2014   PLT 242 12/15/2014   Lab Results  Component Value Date   CREATININE 0.85 12/15/2014   BUN 17 12/15/2014   NA 139 12/15/2014   K 3.8 12/15/2014   CL 103 12/15/2014   CO2 28 12/15/2014   Lab Results  Component Value Date   ALT 15 12/15/2014   AST 22 12/15/2014   ALKPHOS 84 12/15/2014   BILITOT 1.8* 12/15/2014   Current Outpatient Prescriptions on File Prior to Visit  Medication Sig Dispense Refill  . aspirin 81 MG tablet Take 81 mg by mouth daily.    . Biotin 10 MG TABS Take 10,000 each by mouth daily.    . Calcium Citrate-Vitamin D (CALCIUM CITRATE + D3 PO) Take 600 mg by mouth daily.     . ergocalciferol (VITAMIN D2) 50000 UNITS capsule Take 50,000 Units by mouth every other day.    . Ferrous Sulfate (SLOW RELEASE IRON PO) Take by mouth daily.    . fish oil-omega-3 fatty acids 1000 MG capsule Take 1 g by mouth daily.    . hydrochlorothiazide (HYDRODIURIL) 25 MG tablet TAKE ONE TABLET BY MOUTH ONCE DAILY FOR BLOOD PRESSURE AND FLUID 90 tablet 4  . hyoscyamine (LEVSIN SL) 0.125 MG SL tablet Place 1  tablet (0.125 mg total) under the tongue every 6 (six) hours as needed for cramping (nausea, diarrhea). 50 tablet 1  . levothyroxine (SYNTHROID, LEVOTHROID) 75 MCG tablet TAKE ONE TABLET BY MOUTH ONCE DAILY 90 tablet 99  . Magnesium 250 MG TABS Take by mouth daily.    . ondansetron (ZOFRAN) 4 MG tablet Take 1 tablet (4 mg total) by mouth every 8 (eight) hours as needed for nausea or vomiting. 20 tablet 0  . potassium gluconate 595 MG TABS Take 595 mg by mouth daily.     . pravastatin (PRAVACHOL) 40 MG tablet Take 40 mg by mouth daily. 1/2 = 20 mg QOD.  Patient states she is taking every other day.    . vitamin B-12 (CYANOCOBALAMIN) 1000 MCG tablet Take 1,000 mcg by mouth daily.      No current facility-administered medications on file prior to visit.   Past Medical History  Diagnosis Date  . Hypertension     no treatment after 120 lb weight loss  . Hyperlipidemia   . Hypothyroidism   . Thyroid nodule   . Allergic rhinitis   . Arthritis   . Cholecystitis, chronic   . Dysrhythmia     Hx bradycardia - hx normal cardiac cath x2 / ECHO 2009 EF 60% LAE MYOVIEW 3/12 EF 60% NO ISCHEMIA  . Prediabetes  Blood pressure 130/78, pulse 56, temperature 97.9 F (36.6 C), resp. rate 16, weight 144 lb (65.318 kg).  Review of Systems  Constitutional: Positive for chills and appetite change. Negative for fever, diaphoresis, activity change, fatigue and unexpected weight change.  HENT: Positive for postnasal drip and sinus pressure. Negative for congestion, dental problem, drooling, ear discharge, ear pain, facial swelling, hearing loss, mouth sores, nosebleeds, rhinorrhea, sneezing, sore throat, tinnitus, trouble swallowing and voice change.   Respiratory: Negative.   Cardiovascular: Negative.  Negative for chest pain and leg swelling.  Gastrointestinal: Positive for nausea, vomiting and diarrhea. Negative for abdominal pain, constipation, blood in stool, abdominal distention, anal bleeding and rectal  pain.  Genitourinary: Negative.   Musculoskeletal: Negative.   Skin: Negative.   Neurological: Positive for dizziness and headaches. Negative for tremors, seizures, syncope, facial asymmetry, speech difficulty, weakness, light-headedness and numbness.  Psychiatric/Behavioral: Negative.  Negative for confusion.       Objective:   Physical Exam  Constitutional: She is oriented to person, place, and time. She appears well-developed and well-nourished.  HENT:  Head: Normocephalic and atraumatic.  Right Ear: External ear normal.  Left Ear: External ear normal.  Eyes: Conjunctivae and EOM are normal. Pupils are equal, round, and reactive to light.  Neck: Normal range of motion. Neck supple.  Cardiovascular: Normal rate, regular rhythm and normal heart sounds.   No murmur heard. Pulmonary/Chest: Effort normal and breath sounds normal. She has no wheezes.  Abdominal: Soft. Bowel sounds are normal. There is tenderness (epigastric). There is no rebound.  Musculoskeletal: Normal range of motion.  Lymphadenopathy:    She has no cervical adenopathy.  Neurological: She is alert and oriented to person, place, and time. She has normal strength and normal reflexes. She displays normal reflexes. No cranial nerve deficit or sensory deficit. She displays a negative Romberg sign.  Skin: Skin is warm and dry.      Assessment & Plan:  Vertigo- nonsmoker, normal neuro, does not have history of vertigo in the past, meclizine PRN, exercises given, add bASA, get labs rule out anemia/dehydration s/p gastric bypass, if worsening HA, changes vision/speech, imbalance, weakness go to the ER  Patient is s/p gastric bypass- Check labs to do to screen for vitamin deficiencies associated with gastric bypass including Vitamin D, B12, iron. Recommend strict diet and exercise.

## 2014-12-30 NOTE — Patient Instructions (Signed)
Benign Paroxysmal Positional Vertigo (BPPV)  General Information In Benign Paroxysmal Positional Vertigo (BPPV) dizziness is generally thought to be due to debris which has collected within a part of the inner ear. This debris can be thought of as "ear rocks", although the formal name is "otoconia". Ear rocks are small crystals of calcium carbonate derived from a structure in the ear called the "utricle" (figure to the right ). The symptoms of BPPV include dizziness or vertigo, lightheadedness, imbalance, and nausea. Activities which bring on symptoms will vary among persons, but symptoms are usually followed by a change of position of the head like getting out of bed or rolling over in bed are common "problem" motions .  However if you have worsening HA, changes vision/speech, weakness go to the ER     What can be done? 1) Use two or more pillows at night. Avoid sleeping on the "bad" side. In the morning, get up slowly and sit on the edge of the bed for a minute. 2) Medication prescribed by your doctor.  3) The exercises below, you can do at home to help you prevent the sensation later in the day.  4) We can refer you to physical therapy at [facility], [facility] [place] treatments: The Brandt-Daroff Exercises are a home method of treating BPPV,and are effective 95% of the time.  These exercises are performed in three sets per day for two weeks. In each set, one performs the maneuver as shown five times. Start sitting upright (position 1). Then move into the side-lying position (position 2), with the head angled upward about halfway. An easy way to remember this is to imagine someone standing about 6 feet in front of you, and just keep looking at their head at all times. Stay in the side-lying position for 30 seconds, or until the dizziness subsides if this is longer, then go back to the sitting position (position 3). Stay there for 30 seconds, and then go to the opposite side  (position 4) and follow the same routine.  At home Epley Maneuver This procedure seems to be even more effective than the in-office procedure, perhaps because it is repeated every night for a week.  The method (for the left side) is performed as shown on the figure below.  1) One stays in each of the supine (lying down) positions for 30 seconds, and in the sitting upright position (top) for 1 minute.  2) Thus, once cycle takes 2 1/2 minutes.  3) Typically 3 cycles are performed just prior to going to sleep.  4) It is best to do them at night rather than in the morning or midday, as if one becomes dizzy following the exercises, then it can resolve while one is sleeping.  The mirror image of this procedure is used for the right ear.  Take omeprazole over the counter for 2 weeks, then go to zantac 150-300 mg at night for 2 weeks, then you can stop.  Avoid alcohol, spicy foods, NSAIDS (aleve, ibuprofen) at this time. See foods below.   Food Choices for Gastroesophageal Reflux Disease When you have gastroesophageal reflux disease (GERD), the foods you eat and your eating habits are very important. Choosing the right foods can help ease the discomfort of GERD. WHAT GENERAL GUIDELINES DO I NEED TO FOLLOW?  Choose fruits, vegetables, whole grains, low-fat dairy products, and low-fat meat, fish, and poultry.  Limit fats such as oils, salad dressings, butter, nuts, and avocado.  Keep  a food diary to identify foods that cause symptoms.  Avoid foods that cause reflux. These may be different for different people.  Eat frequent small meals instead of three large meals each day.  Eat your meals slowly, in a relaxed setting.  Limit fried foods.  Cook foods using methods other than frying.  Avoid drinking alcohol.  Avoid drinking large amounts of liquids with your meals.  Avoid bending over or lying down until 2-3 hours after eating. WHAT FOODS ARE NOT RECOMMENDED? The following are some foods  and drinks that may worsen your symptoms: Vegetables Tomatoes. Tomato juice. Tomato and spaghetti sauce. Chili peppers. Onion and garlic. Horseradish. Fruits Oranges, grapefruit, and lemon (fruit and juice). Meats High-fat meats, fish, and poultry. This includes hot dogs, ribs, ham, sausage, salami, and bacon. Dairy Whole milk and chocolate milk. Sour cream. Cream. Butter. Ice cream. Cream cheese.  Beverages Coffee and tea, with or without caffeine. Carbonated beverages or energy drinks. Condiments Hot sauce. Barbecue sauce.  Sweets/Desserts Chocolate and cocoa. Donuts. Peppermint and spearmint. Fats and Oils High-fat foods, including [place] fries and potato chips. Other Vinegar. Strong spices, such as black pepper, white pepper, red pepper, cayenne, curry powder, cloves, ginger, and chili powder.

## 2015-01-26 ENCOUNTER — Other Ambulatory Visit: Payer: Self-pay | Admitting: Internal Medicine

## 2015-02-02 ENCOUNTER — Encounter: Payer: Self-pay | Admitting: Internal Medicine

## 2015-02-03 ENCOUNTER — Other Ambulatory Visit: Payer: Self-pay | Admitting: Internal Medicine

## 2015-02-03 MED ORDER — HYDROCODONE-ACETAMINOPHEN 5-325 MG PO TABS: 1.0000 | ORAL_TABLET | Freq: Four times a day (QID) | ORAL | Status: DC | PRN

## 2015-02-25 ENCOUNTER — Encounter: Payer: Self-pay | Admitting: Internal Medicine

## 2015-03-21 ENCOUNTER — Encounter: Payer: Self-pay | Admitting: Internal Medicine

## 2015-03-21 ENCOUNTER — Other Ambulatory Visit: Payer: Self-pay | Admitting: *Deleted

## 2015-03-21 ENCOUNTER — Ambulatory Visit (INDEPENDENT_AMBULATORY_CARE_PROVIDER_SITE_OTHER): Payer: Federal, State, Local not specified - PPO | Admitting: Internal Medicine

## 2015-03-21 VITALS — BP 126/78 | HR 52 | Temp 98.2°F | Resp 16 | Ht 64.0 in | Wt 153.0 lb

## 2015-03-21 DIAGNOSIS — M79676 Pain in unspecified toe(s): Secondary | ICD-10-CM

## 2015-03-21 DIAGNOSIS — B351 Tinea unguium: Secondary | ICD-10-CM

## 2015-03-21 DIAGNOSIS — J069 Acute upper respiratory infection, unspecified: Secondary | ICD-10-CM

## 2015-03-21 MED ORDER — FLUTICASONE PROPIONATE 50 MCG/ACT NA SUSP: 2.0000 | Freq: Every day | NASAL | Status: DC

## 2015-03-21 MED ORDER — AZITHROMYCIN 250 MG PO TABS: ORAL_TABLET | ORAL | Status: DC

## 2015-03-21 MED ORDER — BENZONATATE 100 MG PO CAPS: 100.0000 mg | ORAL_CAPSULE | Freq: Four times a day (QID) | ORAL | Status: DC | PRN

## 2015-03-21 MED ORDER — PREDNISONE 20 MG PO TABS: ORAL_TABLET | ORAL | Status: DC

## 2015-03-21 MED ORDER — AZITHROMYCIN 250 MG PO TABS
ORAL_TABLET | ORAL | Status: DC
Start: 2015-03-21 — End: 2015-04-05

## 2015-03-21 NOTE — Patient Instructions (Signed)
Upper Respiratory Infection, Adult An upper respiratory infection (URI) is also sometimes known as the common cold. The upper respiratory tract includes the nose, sinuses, throat, trachea, and bronchi. Bronchi are the airways leading to the lungs. Most people improve within 1 week, but symptoms can last up to 2 weeks. A residual cough may last even longer.  CAUSES Many different viruses can infect the tissues lining the upper respiratory tract. The tissues become irritated and inflamed and often become very moist. Mucus production is also common. A cold is contagious. You can easily spread the virus to others by oral contact. This includes kissing, sharing a glass, coughing, or sneezing. Touching your mouth or nose and then touching a surface, which is then touched by another person, can also spread the virus. SYMPTOMS  Symptoms typically develop 1 to 3 days after you come in contact with a cold virus. Symptoms vary from person to person. They may include:  Runny nose.  Sneezing.  Nasal congestion.  Sinus irritation.  Sore throat.  Loss of voice (laryngitis).  Cough.  Fatigue.  Muscle aches.  Loss of appetite.  Headache.  Low-grade fever. DIAGNOSIS  You might diagnose your own cold based on familiar symptoms, since most people get a cold 2 to 3 times a year. Your caregiver can confirm this based on your exam. Most importantly, your caregiver can check that your symptoms are not due to another disease such as strep throat, sinusitis, pneumonia, asthma, or epiglottitis. Blood tests, throat tests, and X-rays are not necessary to diagnose a common cold, but they may sometimes be helpful in excluding other more serious diseases. Your caregiver will decide if any further tests are required. RISKS AND COMPLICATIONS  You may be at risk for a more severe case of the common cold if you smoke cigarettes, have chronic heart disease (such as heart failure) or lung disease (such as asthma), or if  you have a weakened immune system. The very young and very old are also at risk for more serious infections. Bacterial sinusitis, middle ear infections, and bacterial pneumonia can complicate the common cold. The common cold can worsen asthma and chronic obstructive pulmonary disease (COPD). Sometimes, these complications can require emergency medical care and may be life-threatening. PREVENTION  The best way to protect against getting a cold is to practice good hygiene. Avoid oral or hand contact with people with cold symptoms. Wash your hands often if contact occurs. There is no clear evidence that vitamin C, vitamin E, echinacea, or exercise reduces the chance of developing a cold. However, it is always recommended to get plenty of rest and practice good nutrition. TREATMENT  Treatment is directed at relieving symptoms. There is no cure. Antibiotics are not effective, because the infection is caused by a virus, not by bacteria. Treatment may include:  Increased fluid intake. Sports drinks offer valuable electrolytes, sugars, and fluids.  Breathing heated mist or steam (vaporizer or shower).  Eating chicken soup or other clear broths, and maintaining good nutrition.  Getting plenty of rest.  Using gargles or lozenges for comfort.  Controlling fevers with ibuprofen or acetaminophen as directed by your caregiver.  Increasing usage of your inhaler if you have asthma. Zinc gel and zinc lozenges, taken in the first 24 hours of the common cold, can shorten the duration and lessen the severity of symptoms. Pain medicines may help with fever, muscle aches, and throat pain. A variety of non-prescription medicines are available to treat congestion and runny nose. Your caregiver   can make recommendations and may suggest nasal or lung inhalers for other symptoms.  HOME CARE INSTRUCTIONS   Only take over-the-counter or prescription medicines for pain, discomfort, or fever as directed by your  caregiver.  Use a warm mist humidifier or inhale steam from a shower to increase air moisture. This may keep secretions moist and make it easier to breathe.  Drink enough water and fluids to keep your urine clear or pale yellow.  Rest as needed.  Return to work when your temperature has returned to normal or as your caregiver advises. You may need to stay home longer to avoid infecting others. You can also use a face mask and careful hand washing to prevent spread of the virus. SEEK MEDICAL CARE IF:   After the first few days, you feel you are getting worse rather than better.  You need your caregiver's advice about medicines to control symptoms.  You develop chills, worsening shortness of breath, or brown or red sputum. These may be signs of pneumonia.  You develop yellow or brown nasal discharge or pain in the face, especially when you bend forward. These may be signs of sinusitis.  You develop a fever, swollen neck glands, pain with swallowing, or white areas in the back of your throat. These may be signs of strep throat. SEEK IMMEDIATE MEDICAL CARE IF:   You have a fever.  You develop severe or persistent headache, ear pain, sinus pain, or chest pain.  You develop wheezing, a prolonged cough, cough up blood, or have a change in your usual mucus (if you have chronic lung disease).  You develop sore muscles or a stiff neck. Document Released: 04/10/2001 Document Revised: 01/07/2012 Document Reviewed: 01/20/2014 Exit[employer] Patient Information 2015 [employer]. This information is not intended to replace advice given to you by your health care provider. Make sure you discuss any questions you have with your health care provider.  

## 2015-03-21 NOTE — Progress Notes (Signed)
Patient ID: Tracy Paul, female   DOB: 08-14-1951, 64 y.o.   MRN: [id number]  HPI  Patient presents to the office for evaluation of cough.  It has been going on for 4 days.  Patient reports night > day, wet, worse with lying down.  They also endorse change in voice, chills, postnasal drip, sputum production and nasal congestion, sore throat, bilateral ear pain.  .  They have tried antitussives and mucinex dm and tylenol prn.  They report that nothing has worked.  They admits to other sick contacts.  Her son and her husband have similar symptoms.  Former smoker quit in 1981.    Patient also reports pain on the 4th toenail which has been going on for several months.  She reports that it curls up and pinches her skin.  She reports that she lost the nail prior to this happening.    Review of Systems  Constitutional: Negative for fever, chills and malaise/fatigue.  HENT: Positive for congestion, ear pain, nosebleeds and sore throat.   Respiratory: Positive for sputum production. Negative for cough, shortness of breath and wheezing.   Cardiovascular: Negative for chest pain, palpitations and leg swelling.  Skin: Negative.   Neurological: Positive for headaches.    PE:  General:  Alert and non-toxic, WDWN, NAD HEENT: NCAT, PERLA, EOM normal, no occular discharge or erythema.  Nasal mucosal edema with sinus tenderness to palpation.  Oropharynx clear with minimal oropharyngeal edema and erythema.  Mucous membranes moist and pink. Neck:  Cervical adenopathy Chest:  RRR no MRGs.  Lungs clear to auscultation A&P with no wheezes rhonchi or rales.   Abdomen: +BS x 4 quadrants, soft, non-tender, no guarding, rigidity, or rebound. Skin: warm and dry no rash Neuro: A&Ox4, CN II-XII grossly intact  Left fourth toenail with mild curve.  Cut back nearly to the quick of the nail with obvious scabbing.  Filed Vitals:   03/21/15 1126  BP: 126/78  Pulse: 52  Temp: 98.2 F (36.8 C)  Resp: 16     Assessment and Plan:   1. Acute URI -nasal saline -drink plenty of fluids -no evidence for PNA now. - fluticasone (FLONASE) 50 MCG/ACT nasal spray; Place 2 sprays into both nostrils daily.  Dispense: 16 g; Refill: 0 - benzonatate (TESSALON PERLES) 100 MG capsule; Take 1 capsule (100 mg total) by mouth every 6 (six) hours as needed for cough.  Dispense: 30 capsule; Refill: 1 - predniSONE (DELTASONE) 20 MG tablet; 3 tabs po day one, then 2 tabs daily x 4 days  Dispense: 11 tablet; Refill: 0 - azithromycin (ZITHROMAX Z-PAK) 250 MG tablet; 2 po day one, then 1 daily x 4 days  Dispense: 5 tablet; Refill: 0  2. Pain due to onychomycosis of toenail -likely secondary to fungus.  Nail removal vs. Possible lamasil - Ambulatory referral to Podiatry

## 2015-03-27 ENCOUNTER — Other Ambulatory Visit: Payer: Self-pay | Admitting: Internal Medicine

## 2015-03-27 DIAGNOSIS — E559 Vitamin D deficiency, unspecified: Secondary | ICD-10-CM

## 2015-03-27 DIAGNOSIS — E782 Mixed hyperlipidemia: Secondary | ICD-10-CM

## 2015-04-05 ENCOUNTER — Telehealth: Payer: Self-pay | Admitting: Internal Medicine

## 2015-04-05 DIAGNOSIS — J069 Acute upper respiratory infection, unspecified: Secondary | ICD-10-CM

## 2015-04-05 MED ORDER — BENZONATATE 200 MG PO CAPS: 200.0000 mg | ORAL_CAPSULE | Freq: Three times a day (TID) | ORAL | Status: DC | PRN

## 2015-04-05 MED ORDER — AZITHROMYCIN 250 MG PO TABS: ORAL_TABLET | ORAL | Status: DC

## 2015-04-05 NOTE — Telephone Encounter (Signed)
Patient calling for refill of tessalon and prefers the 200 mg tablets.  She would also like a refill of her zpak.  She feels that she is still having upper respiratory infection.  zpak and tessalon will be refilled.

## 2015-04-06 ENCOUNTER — Ambulatory Visit (INDEPENDENT_AMBULATORY_CARE_PROVIDER_SITE_OTHER): Payer: Federal, State, Local not specified - PPO | Admitting: Podiatry

## 2015-04-06 ENCOUNTER — Encounter: Payer: Self-pay | Admitting: Podiatry

## 2015-04-06 VITALS — BP 131/67 | HR 47 | Resp 16 | Ht 64.0 in | Wt 154.0 lb

## 2015-04-06 DIAGNOSIS — L600 Ingrowing nail: Secondary | ICD-10-CM | POA: Diagnosis not present

## 2015-04-06 MED ORDER — NEOMYCIN-POLYMYXIN-HC 3.5-10000-1 OT SOLN: OTIC | Status: DC

## 2015-04-06 NOTE — Progress Notes (Signed)
   Subjective:    Patient ID: Tracy Paul, female    DOB: 1951-04-26, 64 y.o.   MRN: [id number] Pt has concerns that her 4th toe on left foot may be ingrown.  Pt toe hs been broke in the past and now growing back different, in which she has hit it in the mean time.  Pt is not diabetic.  HPI    Review of Systems  HENT:       Sinus problem  Ringing in the ears   Neurological: Positive for dizziness, light-headedness and headaches.  All other systems reviewed and are negative.      Objective:   Physical Exam: I have reviewed her past mental history medications allergy surgery social history and review of systems. Pulses are strongly palpable left foot. 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 Fourth toe left foot demonstrates nail dystrophy with pain on palpation onychocryptosis and possible onychomycosis.        Assessment & Plan:  Assessment: Paronychia ingrown nail fourth toe left foot.  Plan: Discussed etiology pathology conservative versus surgical therapies. Performed a total nail avulsion with matrixectomy fourth toe today. She tolerated procedure well with local and aesthetic. Discussed soaking therapy and anti-biotic therapy I will follow-up with her in 1 week.

## 2015-04-06 NOTE — Patient Instructions (Signed)

## 2015-04-13 ENCOUNTER — Ambulatory Visit (INDEPENDENT_AMBULATORY_CARE_PROVIDER_SITE_OTHER): Payer: Federal, State, Local not specified - PPO | Admitting: Podiatry

## 2015-04-13 DIAGNOSIS — L03012 Cellulitis of left finger: Secondary | ICD-10-CM

## 2015-04-13 DIAGNOSIS — L03032 Cellulitis of left toe: Secondary | ICD-10-CM

## 2015-04-13 MED ORDER — CEPHALEXIN 500 MG PO CAPS: 500.0000 mg | ORAL_CAPSULE | Freq: Two times a day (BID) | ORAL | Status: DC

## 2015-04-14 NOTE — Progress Notes (Signed)
She presents today for follow-up of nail avulsion fourth toe left foot with matrixectomy. She states the toe still real sore leaves a little bit. She continues to soak in Epsom salts and warm water.  Objective: Vital signs are stable she is alert and oriented 3 she has mild erythema from the DIPJ distally with no purulence and no malodor.  Assessment: Status post matrixectomy with mild paronychia fourth toe left foot.  Plan: [place] today with a dry sterile compressive dressing encouraged her to do so throughout the day and leave open at night be soaking twice daily however ulcerative prescription for Keflex. I will follow up with her when she returns from [place].

## 2015-04-18 ENCOUNTER — Telehealth: Payer: Self-pay | Admitting: *Deleted

## 2015-04-18 NOTE — Telephone Encounter (Signed)
Called patient - she states that her toe looks worse than it has been. Per Dr. Milinda Pointer - continue soaking in epsom salt, discontinue the drop, cover the toe during the day and leave open at night and if continues to worsen seek medical attention in [place]

## 2015-04-18 NOTE — Telephone Encounter (Signed)
Pt states she had an ingrown toenail procedure 2 weeks ago Wednesday and the toe still looks horrible.

## 2015-05-04 ENCOUNTER — Ambulatory Visit (INDEPENDENT_AMBULATORY_CARE_PROVIDER_SITE_OTHER): Payer: Federal, State, Local not specified - PPO | Admitting: Internal Medicine

## 2015-05-04 ENCOUNTER — Encounter: Payer: Self-pay | Admitting: Internal Medicine

## 2015-05-04 ENCOUNTER — Ambulatory Visit: Payer: Federal, State, Local not specified - PPO | Admitting: Podiatry

## 2015-05-04 VITALS — BP 144/82 | HR 60 | Temp 97.5°F | Resp 16 | Ht 63.5 in | Wt 153.2 lb

## 2015-05-04 DIAGNOSIS — Z1212 Encounter for screening for malignant neoplasm of rectum: Secondary | ICD-10-CM

## 2015-05-04 DIAGNOSIS — Z79899 Other long term (current) drug therapy: Secondary | ICD-10-CM

## 2015-05-04 DIAGNOSIS — K219 Gastro-esophageal reflux disease without esophagitis: Secondary | ICD-10-CM

## 2015-05-04 DIAGNOSIS — E1129 Type 2 diabetes mellitus with other diabetic kidney complication: Secondary | ICD-10-CM

## 2015-05-04 DIAGNOSIS — E039 Hypothyroidism, unspecified: Secondary | ICD-10-CM

## 2015-05-04 DIAGNOSIS — R609 Edema, unspecified: Secondary | ICD-10-CM

## 2015-05-04 DIAGNOSIS — R7309 Other abnormal glucose: Secondary | ICD-10-CM

## 2015-05-04 DIAGNOSIS — R7303 Prediabetes: Secondary | ICD-10-CM

## 2015-05-04 DIAGNOSIS — I10 Essential (primary) hypertension: Secondary | ICD-10-CM

## 2015-05-04 DIAGNOSIS — E559 Vitamin D deficiency, unspecified: Secondary | ICD-10-CM

## 2015-05-04 DIAGNOSIS — R5383 Other fatigue: Secondary | ICD-10-CM

## 2015-05-04 DIAGNOSIS — Z0000 Encounter for general adult medical examination without abnormal findings: Secondary | ICD-10-CM

## 2015-05-04 DIAGNOSIS — E785 Hyperlipidemia, unspecified: Secondary | ICD-10-CM

## 2015-05-04 LAB — CBC WITH DIFFERENTIAL/PLATELET
BASOS ABS: 0 10*3/uL (ref 0.0–0.1)
Basophils Relative: 1 % (ref 0–1)
EOS PCT: 4 % (ref 0–5)
Eosinophils Absolute: 0.2 10*3/uL (ref 0.0–0.7)
HCT: 35.2 % — ABNORMAL LOW (ref 36.0–46.0)
Hemoglobin: 12 g/dL (ref 12.0–15.0)
Lymphocytes Relative: 50 % — ABNORMAL HIGH (ref 12–46)
Lymphs Abs: 2.2 10*3/uL (ref 0.7–4.0)
MCH: 30.5 pg (ref 26.0–34.0)
MCHC: 34.1 g/dL (ref 30.0–36.0)
MCV: 89.3 fL (ref 78.0–100.0)
MPV: 9.8 fL (ref 8.6–12.4)
Monocytes Absolute: 0.4 10*3/uL (ref 0.1–1.0)
Monocytes Relative: 10 % (ref 3–12)
NEUTROS ABS: 1.5 10*3/uL — AB (ref 1.7–7.7)
Neutrophils Relative %: 35 % — ABNORMAL LOW (ref 43–77)
Platelets: 307 10*3/uL (ref 150–400)
RBC: 3.94 MIL/uL (ref 3.87–5.11)
RDW: 13.8 % (ref 11.5–15.5)
WBC: 4.4 10*3/uL (ref 4.0–10.5)

## 2015-05-04 LAB — MAGNESIUM: Magnesium: 1.9 mg/dL (ref 1.5–2.5)

## 2015-05-04 LAB — BASIC METABOLIC PANEL WITH GFR
BUN: 9 mg/dL (ref 6–23)
CALCIUM: 9.1 mg/dL (ref 8.4–10.5)
CHLORIDE: 102 meq/L (ref 96–112)
CO2: 27 mEq/L (ref 19–32)
Creat: 0.78 mg/dL (ref 0.50–1.10)
GFR, Est Non African American: 82 mL/min
GLUCOSE: 89 mg/dL (ref 70–99)
Potassium: 4.1 mEq/L (ref 3.5–5.3)
SODIUM: 138 meq/L (ref 135–145)

## 2015-05-04 LAB — HEPATIC FUNCTION PANEL
ALT: 18 U/L (ref 0–35)
AST: 21 U/L (ref 0–37)
Albumin: 3.8 g/dL (ref 3.5–5.2)
Alkaline Phosphatase: 91 U/L (ref 39–117)
BILIRUBIN INDIRECT: 1.2 mg/dL (ref 0.2–1.2)
Bilirubin, Direct: 0.3 mg/dL (ref 0.0–0.3)
TOTAL PROTEIN: 6.3 g/dL (ref 6.0–8.3)
Total Bilirubin: 1.5 mg/dL — ABNORMAL HIGH (ref 0.2–1.2)

## 2015-05-04 LAB — IRON AND TIBC
%SAT: 23 % (ref 20–55)
Iron: 87 ug/dL (ref 42–145)
TIBC: 378 ug/dL (ref 250–470)
UIBC: 291 ug/dL (ref 125–400)

## 2015-05-04 LAB — LIPID PANEL
Cholesterol: 150 mg/dL (ref 0–200)
HDL: 89 mg/dL (ref >=46)
LDL CALC: 52 mg/dL (ref 0–99)
Total CHOL/HDL Ratio: 1.7 Ratio
Triglycerides: 47 mg/dL (ref <150)
VLDL: 9 mg/dL (ref 0–40)

## 2015-05-04 LAB — HEMOGLOBIN A1C
Hgb A1c MFr Bld: 5.8 % — ABNORMAL HIGH (ref <5.7)
Mean Plasma Glucose: 120 mg/dL — ABNORMAL HIGH (ref <117)

## 2015-05-04 MED ORDER — LISINOPRIL 20 MG PO TABS: ORAL_TABLET | ORAL | Status: DC

## 2015-05-04 MED ORDER — HYDROCHLOROTHIAZIDE 25 MG PO TABS: ORAL_TABLET | ORAL | Status: DC

## 2015-05-04 NOTE — Patient Instructions (Signed)
Recommend Adult Low dose Aspirin or baby Aspirin 81 mg daily   To reduce risk of Colon Cancer 20 %,   Skin Cancer 26 % ,   Melanoma 46%   and   Pancreatic cancer 60%  ++++++++++++++++++  Vitamin D goal is between 70-100.   Please make sure that you are taking your Vitamin D as directed.   It is very important as a natural anti-inflammatory   helping hair, skin, and nails, as well as reducing stroke and heart attack risk.   It helps your bones and helps with mood.  It also decreases numerous cancer risks so please take it as directed.   Low Vit D is associated with a 200-300% higher risk for CANCER   and 200-300% higher risk for HEART   ATTACK  &  STROKE.    ......................................  It is also associated with higher death rate at younger ages,   autoimmune diseases like Rheumatoid arthritis, Lupus, Multiple Sclerosis.     Also many other serious conditions, like depression, Alzheimer's  Dementia, infertility, muscle aches, fatigue, fibromyalgia - just to name a few.  +++++++++++++++++++    Recommend the book "The END of DIETING" by Dr Joel Fuhrman   & the book "The END of DIABETES " by Dr Joel Fuhrman  At [employer] - get book & Audio CD's     Being diabetic has a  300% increased risk for heart attack, stroke, cancer, and alzheimer- type vascular dementia. It is very important that you work harder with diet by avoiding all foods that are white. Avoid white rice (brown & wild rice is OK), white potatoes (sweetpotatoes in moderation is OK), White bread or wheat bread or anything made out of white flour like bagels, donuts, rolls, buns, biscuits, cakes, pastries, cookies, pizza crust, and pasta (made from white flour & egg whites) - vegetarian pasta or spinach or wheat pasta is OK. Multigrain breads like Arnold's or [employer], or multigrain sandwich thins or flatbreads.  Diet, exercise and weight loss can reverse and cure diabetes in the early  stages.  Diet, exercise and weight loss is very important in the control and prevention of complications of diabetes which affects every system in your body, ie. Brain - dementia/stroke, eyes - glaucoma/blindness, heart - heart attack/heart failure, kidneys - dialysis, stomach - gastric paralysis, intestines - malabsorption, nerves - severe painful neuritis, circulation - gangrene & loss of a leg(s), and finally cancer and Alzheimers.    I recommend avoid fried & greasy foods,  sweets/candy, white rice (brown or wild rice or Quinoa is OK), white potatoes (sweet potatoes are OK) - anything made from white flour - bagels, doughnuts, rolls, buns, biscuits,white and wheat breads, pizza crust and traditional pasta made of white flour & egg white(vegetarian pasta or spinach or wheat pasta is OK).  Multi-grain bread is OK - like multi-grain flat bread or sandwich thins. Avoid alcohol in excess. Exercise is also important.    Eat all the vegetables you want - avoid meat, especially red meat and dairy - especially cheese.  Cheese is the most concentrated form of trans-fats which is the worst thing to clog up our arteries. Veggie cheese is OK which can be found in the fresh produce section at [employer] or [employer] or [employer]  ++++++++++++++++++++++++++  Preventive Care for Adults  A healthy lifestyle and preventive care can promote health and wellness. Preventive health guidelines for women include the following key practices.  A routine yearly physical is   a good way to check with your health care provider about your health and preventive screening. It is a chance to share any concerns and updates on your health and to receive a thorough exam.  Visit your dentist for a routine exam and preventive care every 6 months. Brush your teeth twice a day and floss once a day. Good oral hygiene prevents tooth decay and gum disease.  The frequency of eye exams is based on your age, health, family medical  history, use of contact lenses, and other factors. Follow your health care provider's recommendations for frequency of eye exams.  Eat a healthy diet. Foods like vegetables, fruits, whole grains, low-fat dairy products, and lean protein foods contain the nutrients you need without too many calories. Decrease your intake of foods high in solid fats, added sugars, and salt. Eat the right amount of calories for you.Get information about a proper diet from your health care provider, if necessary.  Regular physical exercise is one of the most important things you can do for your health. Most adults should get at least 150 minutes of moderate-intensity exercise (any activity that increases your heart rate and causes you to sweat) each week. In addition, most adults need muscle-strengthening exercises on 2 or more days a week.  Maintain a healthy weight. The body mass index (BMI) is a screening tool to identify possible weight problems. It provides an estimate of body fat based on height and weight. Your health care provider can find your BMI and can help you achieve or maintain a healthy weight.For adults 20 years and older:  A BMI below 18.5 is considered underweight.  A BMI of 18.5 to 24.9 is normal.  A BMI of 25 to 29.9 is considered overweight.  A BMI of 30 and above is considered obese.  Maintain normal blood lipids and cholesterol levels by exercising and minimizing your intake of saturated fat. Eat a balanced diet with plenty of fruit and vegetables. If your lipid or cholesterol levels are high, you are over 50, or you are at high risk for heart disease, you may need your cholesterol levels checked more frequently.Ongoing high lipid and cholesterol levels should be treated with medicines if diet and exercise are not working.  If you smoke, find out from your health care provider how to quit. If you do not use tobacco, do not start.  Lung cancer screening is recommended for adults aged 55-80  years who are at high risk for developing lung cancer because of a history of smoking. A yearly low-dose CT scan of the lungs is recommended for people who have at least a 30-pack-year history of smoking and are a current smoker or have quit within the past 15 years. A pack year of smoking is smoking an average of 1 pack of cigarettes a day for 1 year (for example: 1 pack a day for 30 years or 2 packs a day for 15 years). Yearly screening should continue until the smoker has stopped smoking for at least 15 years. Yearly screening should be stopped for people who develop a health problem that would prevent them from having lung cancer treatment.  Avoid use of street drugs. Do not share needles with anyone. Ask for help if you need support or instructions about stopping the use of drugs.  High blood pressure causes heart disease and increases the risk of stroke.  Ongoing high blood pressure should be treated with medicines if weight loss and exercise do not work.  If   you are 55-79 years old, ask your health care provider if you should take aspirin to prevent strokes.  Diabetes screening involves taking a blood sample to check your fasting blood sugar level. This should be done once every 3 years, after age 45, if you are within normal weight and without risk factors for diabetes. Testing should be considered at a younger age or be carried out more frequently if you are overweight and have at least 1 risk factor for diabetes.  Breast cancer screening is essential preventive care for women. You should practice "breast self-awareness." This means understanding the normal appearance and feel of your breasts and may include breast self-examination. Any changes detected, no matter how small, should be reported to a health care provider. Women in their 20s and 30s should have a clinical breast exam (CBE) by a health care provider as part of a regular health exam every 1 to 3 years. After age 40, women should have a  CBE every year. Starting at age 40, women should consider having a mammogram (breast X-ray test) every year. Women who have a family history of breast cancer should talk to their health care provider about genetic screening. Women at a high risk of breast cancer should talk to their health care providers about having an MRI and a mammogram every year.  Breast cancer gene (BRCA)-related cancer risk assessment is recommended for women who have family members with BRCA-related cancers. BRCA-related cancers include breast, ovarian, tubal, and peritoneal cancers. Having family members with these cancers may be associated with an increased risk for harmful changes (mutations) in the breast cancer genes BRCA1 and BRCA2. Results of the assessment will determine the need for genetic counseling and BRCA1 and BRCA2 testing.  Routine pelvic exams to screen for cancer are no longer recommended for nonpregnant women who are considered low risk for cancer of the pelvic organs (ovaries, uterus, and vagina) and who do not have symptoms. Ask your health care provider if a screening pelvic exam is right for you.  If you have had past treatment for cervical cancer or a condition that could lead to cancer, you need Pap tests and screening for cancer for at least 20 years after your treatment. If Pap tests have been discontinued, your risk factors (such as having a new sexual partner) need to be reassessed to determine if screening should be resumed. Some women have medical problems that increase the chance of getting cervical cancer. In these cases, your health care provider may recommend more frequent screening and Pap tests.    Colorectal cancer can be detected and often prevented. Most routine colorectal cancer screening begins at the age of 50 years and continues through age 75 years. However, your health care provider may recommend screening at an earlier age if you have risk factors for colon cancer. On a yearly basis,  your health care provider may provide home test kits to check for hidden blood in the stool. Use of a small camera at the end of a tube, to directly examine the colon (sigmoidoscopy or colonoscopy), can detect the earliest forms of colorectal cancer. Talk to your health care provider about this at age 50, when routine screening begins. Direct exam of the colon should be repeated every 5-10 years through age 75 years, unless early forms of pre-cancerous polyps or small growths are found.  Osteoporosis is a disease in which the bones lose minerals and strength with aging. This can result in serious bone fractures or breaks. The   risk of osteoporosis can be identified using a bone density scan. Women ages 65 years and over and women at risk for fractures or osteoporosis should discuss screening with their health care providers. Ask your health care provider whether you should take a calcium supplement or vitamin D to reduce the rate of osteoporosis.  Menopause can be associated with physical symptoms and risks. Hormone replacement therapy is available to decrease symptoms and risks. You should talk to your health care provider about whether hormone replacement therapy is right for you.  Use sunscreen. Apply sunscreen liberally and repeatedly throughout the day. You should seek shade when your shadow is shorter than you. Protect yourself by wearing long sleeves, pants, a wide-brimmed hat, and sunglasses year round, whenever you are outdoors.  Once a month, do a whole body skin exam, using a mirror to look at the skin on your back. Tell your health care provider of new moles, moles that have irregular borders, moles that are larger than a pencil eraser, or moles that have changed in shape or color.  Stay current with required vaccines (immunizations).  Influenza vaccine. All adults should be immunized every year.  Tetanus, diphtheria, and acellular pertussis (Td, Tdap) vaccine. Pregnant women should receive  1 dose of Tdap vaccine during each pregnancy. The dose should be obtained regardless of the length of time since the last dose. Immunization is preferred during the 27th-36th week of gestation. An adult who has not previously received Tdap or who does not know her vaccine status should receive 1 dose of Tdap. This initial dose should be followed by tetanus and diphtheria toxoids (Td) booster doses every 10 years. Adults with an unknown or incomplete history of completing a 3-dose immunization series with Td-containing vaccines should begin or complete a primary immunization series including a Tdap dose. Adults should receive a Td booster every 10 years.    Zoster vaccine. One dose is recommended for adults aged 60 years or older unless certain conditions are present.    Pneumococcal 13-valent conjugate (PCV13) vaccine. When indicated, a person who is uncertain of her immunization history and has no record of immunization should receive the PCV13 vaccine. An adult aged 19 years or older who has certain medical conditions and has not been previously immunized should receive 1 dose of PCV13 vaccine. This PCV13 should be followed with a dose of pneumococcal polysaccharide (PPSV23) vaccine. The PPSV23 vaccine dose should be obtained at least 8 weeks after the dose of PCV13 vaccine. An adult aged 19 years or older who has certain medical conditions and previously received 1 or more doses of PPSV23 vaccine should receive 1 dose of PCV13. The PCV13 vaccine dose should be obtained 1 or more years after the last PPSV23 vaccine dose.    Pneumococcal polysaccharide (PPSV23) vaccine. When PCV13 is also indicated, PCV13 should be obtained first. All adults aged 65 years and older should be immunized. An adult younger than age 65 years who has certain medical conditions should be immunized. Any person who resides in a nursing home or long-term care facility should be immunized. An adult smoker should be immunized.  People with an immunocompromised condition and certain other conditions should receive both PCV13 and PPSV23 vaccines. People with human immunodeficiency virus (HIV) infection should be immunized as soon as possible after diagnosis. Immunization during chemotherapy or radiation therapy should be avoided. Routine use of PPSV23 vaccine is not recommended for American Indians, [place] Natives, or people younger than 65 years unless there are medical   conditions that require PPSV23 vaccine. When indicated, people who have unknown immunization and have no record of immunization should receive PPSV23 vaccine. One-time revaccination 5 years after the first dose of PPSV23 is recommended for people aged 19-64 years who have chronic kidney failure, nephrotic syndrome, asplenia, or immunocompromised conditions. People who received 1-2 doses of PPSV23 before age 9 years should receive another dose of PPSV23 vaccine at age 62 years or later if at least 5 years have passed since the previous dose. Doses of PPSV23 are not needed for people immunized with PPSV23 at or after age 53 years.   Preventive Services / Frequency  Ages 32 years and over  Blood pressure check.  Lipid and cholesterol check.  Lung cancer screening. / Every year if you are aged 71-80 years and have a 30-pack-year history of smoking and currently smoke or have quit within the past 15 years. Yearly screening is stopped once you have quit smoking for at least 15 years or develop a health problem that would prevent you from having lung cancer treatment.  Clinical breast exam.** / Every year after age 65 years.  BRCA-related cancer risk assessment.** / For women who have family members with a BRCA-related cancer (breast, ovarian, tubal, or peritoneal cancers).  Mammogram.** / Every year beginning at age 46 years and continuing for as long as you are in good health. Consult with your health care provider.  Pap test.** / Every 3 years starting at  age 71 years through age 47 or 50 years with 3 consecutive normal Pap tests. Testing can be stopped between 65 and 70 years with 3 consecutive normal Pap tests and no abnormal Pap or HPV tests in the past 10 years.  Fecal occult blood test (FOBT) of stool. / Every year beginning at age 44 years and continuing until age 73 years. You may not need to do this test if you get a colonoscopy every 10 years.  Flexible sigmoidoscopy or colonoscopy.** / Every 5 years for a flexible sigmoidoscopy or every 10 years for a colonoscopy beginning at age 37 years and continuing until age 2 years.  Hepatitis C blood test.** / For all people born from [birth year] through 1964 and any individual with known risks for hepatitis C.  Osteoporosis screening.** / A one-time screening for women ages 9 years and over and women at risk for fractures or osteoporosis.  Skin self-exam. / Monthly.  Influenza vaccine. / Every year.  Tetanus, diphtheria, and acellular pertussis (Tdap/Td) vaccine.** / 1 dose of Td every 10 years.  Zoster vaccine.** / 1 dose for adults aged 31 years or older.  Pneumococcal 13-valent conjugate (PCV13) vaccine.** / Consult your health care provider.  Pneumococcal polysaccharide (PPSV23) vaccine.** / 1 dose for all adults aged 32 years and older. Screening for abdominal aortic aneurysm (AAA)  by ultrasound is recommended for people who have history of high blood pressure or who are current or former smokers.

## 2015-05-04 NOTE — Progress Notes (Signed)
Patient ID: Tracy Paul, female   DOB: 1950-12-08, 64 y.o.   MRN: [id number]  Annual Comprehensive Examination  This very nice 64 y.o. MWF presents for complete physical.  Patient has been followed for HTN, Prediabetes, Hyperlipidemia, Hypothyroids  and Vitamin D Deficiency.    HTN predates since 1995. Patient's BP has been controlled at home and patient denies any cardiac symptoms as chest pain, palpitations, shortness of breath, dizziness or ankle swelling. Patient had negative Heart Cath's x 2 in 1995 & 2000. Today's BP is 144 /82.     Patient's hyperlipidemia is controlled with diet and medications. Patient denies myalgias or other medication SE's. Last lipids were at goal -  Chol 141; HDL 69; LDL  61; Trig 57 on 12/15/2014.   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 Patient has almost normalized her A1c's and thus cured her diabetes since the weight loss. Patient denies reactive hypoglycemic symptoms, visual blurring, diabetic polys, or paresthesias. Last A1c was  5.7% on 12/15/2014.       Patient has been on thyroid replacement therapy since 2008. Finally, patient has history of Vitamin D Deficiency of 28 in 2008 and last Vitamin D was 83 on2/17/2016.       Medication Sig  . aspirin 81 MG tablet Take 81 mg by mouth daily.  . Biotin 10 MG TABS Take 10,000 each by mouth daily.  . Calcium Citrate-Vitamin D  Take 600 mg by mouth daily.   Marland Kitchen VITAMIN D2 50000 UNITS  Take 50,000 Units by mouth every other day.  Marland Kitchen SLOW RELEASE IRON  Take by mouth daily.  . fish oil 1000 MG capsule Take 1 g by mouth daily.  . fluticasone (FLONASE) nasal spray Place 2 sprays into both nostrils daily.  . hctz 25 MG tablet TAKE ONE TABLET BY MOUTH ONCE DAILY FOR BLOOD PRESSURE AND FLUID  .  levothyroxine  75 MCG tablet TAKE ONE TABLET BY MOUTH ONCE DAILY  . Magnesium 250 MG TABS Take by mouth daily.  . potassium gluconate 595 MG TABS Take 595 mg by mouth daily.   . pravastatin ( 40 MG tab TAKE ONE TABLET BY MOUTH ONCE DAILY  . vitamin B-12  1000 MCG tablet Take 1,000 mcg by mouth daily.   . Vitamin D  50,000 UNITS CAPS TAKE ONE CAPSULE BY MOUTH ONCE DAILY  . pravastatin (PRAVACHOL) 40 MG tab Take 40 mg by mouth daily. 1/2 = 20 mg QOD.  Patient states she is taking every other day.   Allergies  Allergen Reactions  . Codeine Other (See Comments)    "puts me out of it".  Dysphoria.  Patient tolerates Tramadol without complications.  . Nabumetone     GI upset   Past Medical History  Diagnosis Date  . Hypertension     no treatment after 120 lb weight loss  . Hyperlipidemia   . Hypothyroidism   . Thyroid nodule   . Allergic rhinitis   . Arthritis   . Cholecystitis, chronic   . Dysrhythmia     Hx bradycardia - hx normal cardiac cath x2 / ECHO 2009 EF 60% LAE MYOVIEW 3/12 EF 60% NO ISCHEMIA  . Prediabetes    Health Maintenance  Topic Date Due  .  PAP SMEAR  12/20/2013  . MAMMOGRAM  07/25/2014  . INFLUENZA VACCINE  05/30/2015  . TETANUS/TDAP  08/08/2016  . COLONOSCOPY  03/17/2024  . ZOSTAVAX  Completed  . HIV Screening  Completed   Immunization History  Administered Date(s) Administered  . Influenza Split 08/25/2013, 09/02/2014  . PPD Test 02/23/2014  . Pneumococcal Polysaccharide-23 10/18/2008  . Td 08/08/2006  . Zoster 10/15/2012   Past Surgical History  Procedure Laterality Date  . Bilateral carpal tunnel surgery    . Replacement total knee bilateral      bilat  . Nasal sinus surgery    . Tubal ligation    . Breath tek h pylori  07/01/2012    Procedure: BREATH TEK H PYLORI;  Surgeon: Edward Jolly, MD;  Location: [facility];  Service: General;  Laterality: N/A;  . Gastric roux-en-y N/A 12/16/2012    Procedure: LAPAROSCOPIC ROUX-EN-Y GASTRIC BYPASS  WITH UPPER ENDOSCOPY;  Surgeon: Pedro Earls, MD;  Location: [facility];  Service: General;  Laterality: N/A;  Gastric Bypass  . Cholecystectomy N/A 11/27/2013    Procedure: LAPAROSCOPIC CHOLECYSTECTOMY WITH INTRAOPERATIVE CHOLANGIOGRAM;  Surgeon: Pedro Earls, MD;  Location: [facility];  Service: General;  Laterality: N/A;   Family History  Problem Relation Age of Onset  . Breast cancer Mother   . Cancer Mother     breast cancer  . Anuerysm Mother     brain anuerysm  . Heart attack Father 80  . Heart attack Sister 69   History  Substance Use Topics  . Smoking status: Former Smoker -- 0.80 packs/day for 10 years    Types: Cigarettes    Quit date: 10/30/1979  . Smokeless tobacco: Not on file  . Alcohol Use: No    ROS Constitutional: Denies fever, chills, weight loss/gain, headaches, insomnia,  night sweats, and change in appetite. Does c/o fatigue. Eyes: Denies redness, blurred vision, diplopia, discharge, itchy, watery eyes.  ENT: Denies discharge, congestion, post nasal drip, epistaxis, sore throat, earache, hearing loss, dental pain, Tinnitus, Vertigo, Sinus pain, snoring.  Cardio: Denies chest pain, palpitations, irregular heartbeat, syncope, dyspnea, diaphoresis, orthopnea, PND, claudication, edema Respiratory: denies cough, dyspnea, DOE, pleurisy, hoarseness, laryngitis, wheezing.  Gastrointestinal: Denies dysphagia, heartburn, reflux, water brash, pain, cramps, nausea, vomiting, bloating, diarrhea, constipation, hematemesis, melena, hematochezia, jaundice, hemorrhoids Genitourinary: Denies dysuria, frequency, urgency, nocturia, hesitancy, discharge, hematuria, flank pain Breast: Breast lumps, nipple discharge, bleeding.  Musculoskeletal: Denies arthralgia, myalgia, stiffness, Jt. Swelling, pain, limp, and strain/sprain. Denies falls. Skin: Denies puritis, rash, hives, warts, acne, eczema, changing in skin lesion Neuro: No weakness, tremor, incoordination, spasms, paresthesia,  pain Psychiatric: Denies confusion, memory loss, sensory loss. Denies Depression. Endocrine: Denies change in weight, skin, hair change, nocturia, and paresthesia, diabetic polys, visual blurring, hyper / hypo glycemic episodes.  Heme/Lymph: No excessive bleeding, bruising, enlarged lymph nodes.  Physical Exam  BP 144/82   Pulse 60  Temp 97.5 F   Resp 16  Ht 5' 3.5"   Wt 153 lb 3.2 oz     BMI 26.71   General Appearance: Well nourished and in no apparent distress. Eyes: PERRLA, EOMs, conjunctiva no swelling or erythema, normal fundi and vessels. Sinuses: No frontal/maxillary tenderness ENT/Mouth: EACs patent / TMs  nl. Nares clear without erythema, swelling, mucoid exudates. Oral hygiene is good. No erythema, swelling, or exudate. Tongue normal, non-obstructing. Tonsils not swollen or erythematous. Hearing normal.  Neck: Supple, thyroid normal. No bruits, nodes or JVD. Respiratory: Respiratory effort normal.  BS equal and clear  bilateral without rales, rhonci, wheezing or stridor. Cardio: Heart sounds are normal with regular rate and rhythm and no murmurs, rubs or gallops. Peripheral pulses are normal and equal bilaterally without edema. No aortic or femoral bruits. Chest: symmetric with normal excursions and percussion. Breasts: Symmetric, without lumps, nipple discharge, retractions, or fibrocystic changes.  Abdomen: Flat, soft, with bowl sounds. Midline vertical reversed -S abd scar. Nontender, no guarding, rebound, hernias, masses, or organomegaly.  Lymphatics: Non tender without lymphadenopathy.  Musculoskeletal: Full ROM all peripheral extremities, joint stability, 5/5 strength, and normal gait. Skin: Warm and dry without rashes, lesions, cyanosis, clubbing or  ecchymosis.  Neuro: Cranial nerves intact, reflexes equal bilaterally. Normal muscle tone, no cerebellar symptoms. Sensation intact by monofilament testing to the toes bilaterally.Marland Kitchen  Pysch: Awake and oriented X 3, normal  affect, Insight and Judgment appropriate.   Assessment and Plan  1. Essential hypertension  - Microalbumin / creatinine urine ratio - EKG 12-Lead - [place], RETROPERITNL ABD,  LTD - TSH - lisinopril 20 MG tablet; Take 1 tablet daily for BP  Dispense: 90 tablet; Refill: 1  2. Hyperlipidemia  - Lipid panel  3. Prediabetes  - Hemoglobin A1c - Insulin, random  4. Vitamin D deficiency  - Vit D  25 hydroxy   5. Hypothyroidism  - TSH  6. Gastroesophageal reflux disease   7. Screening for rectal cancer  - POC Hemoccult Bld/Stl   8. Other fatigue  - Vitamin B12 - Iron and TIBC  9. Medication management  - Urine Microscopic - CBC with Differential/Platelet - BASIC METABOLIC PANEL WITH GFR - Hepatic function panel - Magnesium  10. Edema  - hydrochlorothiazide 25 MG tablet; Take 1/2 to 1 tablet daily ONLY if needed for fluid retention & ankle swelling  Dispense: 90 tablet; Refill: 1  11. 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 Discussed med's effects and SE's. Screening labs and tests as requested with regular follow-up as recommended.  Over 40 minutes of exam, counseling, chart review was performed.

## 2015-05-05 LAB — URINALYSIS, MICROSCOPIC ONLY
Bacteria, UA: NONE SEEN
CRYSTALS: NONE SEEN
Casts: NONE SEEN
Squamous Epithelial / LPF: NONE SEEN

## 2015-05-05 LAB — VITAMIN D 25 HYDROXY (VIT D DEFICIENCY, FRACTURES): Vit D, 25-Hydroxy: 75 ng/mL (ref 30–100)

## 2015-05-05 LAB — VITAMIN B12: Vitamin B-12: 740 pg/mL (ref 211–911)

## 2015-05-05 LAB — MICROALBUMIN / CREATININE URINE RATIO
Creatinine, Urine: 84.3 mg/dL
MICROALB/CREAT RATIO: 2.4 mg/g (ref 0.0–30.0)
Microalb, Ur: 0.2 mg/dL (ref <2.0)

## 2015-05-05 LAB — TSH: TSH: 1.116 u[IU]/mL (ref 0.350–4.500)

## 2015-05-05 LAB — INSULIN, RANDOM: INSULIN: 3.2 u[IU]/mL (ref 2.0–19.6)

## 2015-05-06 ENCOUNTER — Ambulatory Visit (INDEPENDENT_AMBULATORY_CARE_PROVIDER_SITE_OTHER): Payer: Federal, State, Local not specified - PPO | Admitting: Podiatry

## 2015-05-06 ENCOUNTER — Encounter: Payer: Self-pay | Admitting: Podiatry

## 2015-05-06 VITALS — BP 96/47 | HR 58 | Resp 18

## 2015-05-06 DIAGNOSIS — L03032 Cellulitis of left toe: Secondary | ICD-10-CM | POA: Diagnosis not present

## 2015-05-06 DIAGNOSIS — L03012 Cellulitis of left finger: Secondary | ICD-10-CM

## 2015-05-06 NOTE — Progress Notes (Signed)
She presents today for follow-up of matrixectomy fourth digit left foot where a slight paronychia was present. At this point she states that it seems to be doing quite well if I hadn't hit it would be better by now. She is currently not soaking it.  Objective: Vital signs are stable she is alert and oriented 3. No erythema saline strange odor eschars present I removed the eschar for her today there's no signs of infection. Granulation tissue was present which I resected. Placed a dry sterile compressive dressing.  Assessment: Paronychia fourth digit left foot status post matrixectomy slow healing.  Plan: Encouraged her soak at least once a day Epsom salts and warm water and covered the daily open at night.

## 2015-05-16 ENCOUNTER — Other Ambulatory Visit: Payer: Self-pay | Admitting: Internal Medicine

## 2015-05-16 ENCOUNTER — Ambulatory Visit (INDEPENDENT_AMBULATORY_CARE_PROVIDER_SITE_OTHER): Payer: Federal, State, Local not specified - PPO | Admitting: Physician Assistant

## 2015-05-16 ENCOUNTER — Encounter: Payer: Self-pay | Admitting: Physician Assistant

## 2015-05-16 ENCOUNTER — Other Ambulatory Visit: Payer: Self-pay | Admitting: Physician Assistant

## 2015-05-16 VITALS — BP 122/72 | HR 50 | Temp 98.2°F | Resp 16 | Ht 63.5 in | Wt 155.2 lb

## 2015-05-16 DIAGNOSIS — I959 Hypotension, unspecified: Secondary | ICD-10-CM

## 2015-05-16 DIAGNOSIS — R42 Dizziness and giddiness: Secondary | ICD-10-CM

## 2015-05-16 DIAGNOSIS — R001 Bradycardia, unspecified: Secondary | ICD-10-CM

## 2015-05-16 DIAGNOSIS — R51 Headache: Secondary | ICD-10-CM

## 2015-05-16 DIAGNOSIS — R519 Headache, unspecified: Secondary | ICD-10-CM

## 2015-05-16 LAB — CBC WITH DIFFERENTIAL/PLATELET
BASOS ABS: 0.1 10*3/uL (ref 0.0–0.1)
Basophils Relative: 1 % (ref 0–1)
EOS ABS: 0.2 10*3/uL (ref 0.0–0.7)
Eosinophils Relative: 3 % (ref 0–5)
HCT: 34.6 % — ABNORMAL LOW (ref 36.0–46.0)
HEMOGLOBIN: 11.3 g/dL — AB (ref 12.0–15.0)
LYMPHS ABS: 2.4 10*3/uL (ref 0.7–4.0)
LYMPHS PCT: 41 % (ref 12–46)
MCH: 29.5 pg (ref 26.0–34.0)
MCHC: 32.7 g/dL (ref 30.0–36.0)
MCV: 90.3 fL (ref 78.0–100.0)
MPV: 9.9 fL (ref 8.6–12.4)
Monocytes Absolute: 0.5 10*3/uL (ref 0.1–1.0)
Monocytes Relative: 9 % (ref 3–12)
NEUTROS PCT: 46 % (ref 43–77)
Neutro Abs: 2.7 10*3/uL (ref 1.7–7.7)
PLATELETS: 304 10*3/uL (ref 150–400)
RBC: 3.83 MIL/uL — AB (ref 3.87–5.11)
RDW: 13.4 % (ref 11.5–15.5)
WBC: 5.9 10*3/uL (ref 4.0–10.5)

## 2015-05-16 NOTE — Patient Instructions (Signed)
Do not take the lisinopril today If your BP in the morning is less than 130 than don't take any BP medication If it is between 130-150 take 1/2 If it is above 150 take a whole lisinopril  Make a follow up appointment with Dr. Percival Spanish for a holter monitor.   Please monitor your blood pressure. If it is getting below 130/80 AND you are having fatigue with exertion, dizziness we may need to cut your blood pressure medication in half. Please call the office if this is happening. Hypotension As your heart beats, it forces blood through your body. This force is called blood pressure. If you have hypotension, you have low blood pressure. When your blood pressure is too low, you may not get enough blood to your brain. You may feel weak, feel lightheaded, have a fast heartbeat, or even pass out (faint). HOME CARE  Drink enough fluids to keep your pee (urine) clear or pale yellow.  Take all medicines as told by your doctor.  Get up slowly after sitting or lying down.  Wear support stockings as told by your doctor.  Maintain a healthy diet by including foods such as fruits, vegetables, nuts, whole grains, and lean meats. GET HELP IF:  You are throwing up (vomiting) or have watery poop (diarrhea).  You have a fever for more than 2-3 days.  You feel more thirsty than usual.  You feel weak and tired. GET HELP RIGHT AWAY IF:   You pass out (faint).  You have chest pain or a fast or irregular heartbeat.  You lose feeling in part of your body.  You cannot move your arms or legs.  You have trouble speaking.  You get sweaty or feel lightheaded. MAKE SURE YOU:   Understand these instructions.  Will watch your condition.  Will get help right away if you are not doing well or get worse. Document Released: 01/09/2010 Document Revised: 06/17/2013 Document Reviewed: 04/17/2013 [facility] Patient Information 2015 [place], [place]. This information is not intended to replace advice given to you  by your health care provider. Make sure you discuss any questions you have with your health care provider.  Bradycardia Bradycardia is a term for a heart rate (pulse) that, in adults, is slower than 60 beats per minute. A normal rate is 60 to 100 beats per minute. A heart rate below 60 beats per minute may be normal for some adults with healthy hearts. If the rate is too slow, the heart may have trouble pumping the volume of blood the body needs. If the heart rate gets too low, blood flow to the brain may be decreased and may make you feel lightheaded, dizzy, or faint. The heart has a natural pacemaker in the top of the heart called the SA node (sinoatrial or sinus node). This pacemaker sends out regular electrical signals to the muscle of the heart, telling the heart muscle when to beat (contract). The electrical signal travels from the upper parts of the heart (atria) through the AV node (atrioventricular node), to the lower chambers of the heart (ventricles). The ventricles squeeze, pumping the blood from your heart to your lungs and to the rest of your body. CAUSES   Problem with the heart's electrical system.  Problem with the heart's natural pacemaker.  Heart disease, damage, or infection.  Medications.  Problems with minerals and salts (electrolytes). SYMPTOMS   Fainting (syncope).  Fatigue and weakness.  Shortness of breath (dyspnea).  Chest pain (angina).  Drowsiness.  Confusion. DIAGNOSIS  An electrocardiogram (ECG) can help your caregiver determine the type of slow heart rate you have.  If the cause is not seen on an ECG, you may need to wear a heart monitor that records your heart rhythm for several hours or days.  Blood tests. TREATMENT   Electrolyte supplements.  Medications.  Withholding medication which is causing a slow heart rate.  Pacemaker placement. SEEK IMMEDIATE MEDICAL CARE IF:   You feel lightheaded or faint.  You develop an irregular heart  rate.  You feel chest pain or have trouble breathing. MAKE SURE YOU:   Understand these instructions.  Will watch your condition.  Will get help right away if you are not doing well or get worse. Document Released: 07/07/2002 Document Revised: 01/07/2012 Document Reviewed: 01/20/2014 [facility] Patient Information 2015 [place], [place]. This information is not intended to replace advice given to you by your health care provider. Make sure you discuss any questions you have with your health care provider.

## 2015-05-16 NOTE — Progress Notes (Signed)
Subjective:    Patient ID: Tracy Paul, female    DOB: Jun 16, 1951, 64 y.o.   MRN: [id number]  HPI 64 y.o. female with 8 pack year smoking history Q 81, HTN, chol, hyopthyroidism, preDM s/p gastric bypass presents for fatigue x 5 days. She has had fatigue, dizziness, HA, x 5 days she has been monitoring her BP and HR and it has been 107/58, 102/56, and heart rate from 45-64. She is on lisinopril and has been off HCTZ, she is on thyroid medication but she is not on AV nodal agent. She had an echo in 08/2008 that was normal and a myoview in 2012 that was normal, has seen Dr. Percival Spanish in the past. Some SOB with exertion, dizziness and HA with standing/exertion. Has had some diarrhea without N, V, dark black stool, blood in stool. Denies CP, cough, wheezing.   Blood pressure 122/72, pulse 50, temperature 98.2 F (36.8 C), resp. rate 16, height 5' 3.5" (1.613 m), weight 155 lb 3.2 oz (70.398 kg). .  Lab Results  Component Value Date   TSH 1.116 05/04/2015   Lab Results  Component Value Date   CREATININE 0.78 05/04/2015   BUN 9 05/04/2015   NA 138 05/04/2015   K 4.1 05/04/2015   CL 102 05/04/2015   CO2 27 05/04/2015    Pulse Readings from Last 20 Encounters:  05/16/15 50  05/06/15 58  05/04/15 60  04/06/15 47  03/21/15 52  12/30/14 56  12/15/14 52  09/16/14 55  09/02/14 52  06/02/14 56  04/05/14 64  03/16/14 52  02/23/14 48  12/29/13 72  12/16/13 66  12/02/13 64  11/28/13 64  11/25/13 57  11/06/13 68  11/02/13 68   Current Outpatient Prescriptions on File Prior to Visit  Medication Sig Dispense Refill  . aspirin 81 MG tablet Take 81 mg by mouth daily.    . Biotin 10 MG TABS Take 10,000 each by mouth daily.    . Calcium Citrate-Vitamin D (CALCIUM CITRATE + D3 PO) Take 600 mg by mouth daily.     . ergocalciferol (VITAMIN D2) 50000 UNITS capsule Take 50,000 Units by mouth every other day.    . Ferrous Sulfate (SLOW RELEASE IRON PO) Take by mouth daily.    . fish  oil-omega-3 fatty acids 1000 MG capsule Take 1 g by mouth daily.    . fluticasone (FLONASE) 50 MCG/ACT nasal spray Place 2 sprays into both nostrils daily. 16 g 0  . hydrochlorothiazide (HYDRODIURIL) 25 MG tablet Take 1/2 to 1 tablet daily ONLY if needed for fluid retention & ankle swelling 90 tablet 1  . levothyroxine (SYNTHROID, LEVOTHROID) 75 MCG tablet TAKE ONE TABLET BY MOUTH ONCE DAILY 90 tablet 99  . lisinopril (PRINIVIL,ZESTRIL) 20 MG tablet Take 1 tablet daily for BP 90 tablet 1  . Magnesium 250 MG TABS Take by mouth daily.    . potassium gluconate 595 MG TABS Take 595 mg by mouth daily.     . pravastatin (PRAVACHOL) 40 MG tablet TAKE ONE TABLET BY MOUTH ONCE DAILY 90 tablet 0  . vitamin B-12 (CYANOCOBALAMIN) 1000 MCG tablet Take 1,000 mcg by mouth daily.     . Vitamin D, Ergocalciferol, (DRISDOL) 50000 UNITS CAPS capsule TAKE ONE CAPSULE BY MOUTH ONCE DAILY 30 capsule 2   No current facility-administered medications on file prior to visit.   Past Medical History  Diagnosis Date  . Hypertension     no treatment after 120 lb weight loss  .  Hyperlipidemia   . Hypothyroidism   . Thyroid nodule   . Allergic rhinitis   . Arthritis   . Cholecystitis, chronic   . Dysrhythmia     Hx bradycardia - hx normal cardiac cath x2 / ECHO 2009 EF 60% LAE MYOVIEW 3/12 EF 60% NO ISCHEMIA  . Prediabetes    Review of Systems  Constitutional: Positive for fatigue. Negative for fever, chills, diaphoresis, activity change, appetite change and unexpected weight change.  Respiratory: Negative.   Cardiovascular: Negative.  Negative for palpitations.  Gastrointestinal: Positive for diarrhea. Negative for nausea, vomiting, abdominal pain, constipation, blood in stool, abdominal distention, anal bleeding and rectal pain.  Genitourinary: Negative.  Negative for dysuria.  Musculoskeletal: Negative.  Negative for arthralgias.  Skin: Negative.  Negative for rash.  Neurological: Positive for dizziness,  light-headedness and headaches. Negative for tremors, seizures, syncope, facial asymmetry, speech difficulty, weakness and numbness.  Psychiatric/Behavioral: Negative.        Objective:   Physical Exam  Constitutional: She is oriented to person, place, and time. She appears well-developed and well-nourished.  Neck: Normal range of motion. Neck supple.  Cardiovascular: Regular rhythm.   No murmur heard. Sinus bradycardia  Pulmonary/Chest: Effort normal and breath sounds normal.  Abdominal: Soft. Bowel sounds are normal. There is no tenderness.  Musculoskeletal: Normal range of motion.  Neurological: She is alert and oriented to person, place, and time.  Skin: Skin is warm and dry. No rash noted.      Assessment & Plan:  Dizziness- no evidence of heart block/EKG changes ? hypovolemia or from new ACE- cut ACE in half, check CBC, CMET TSH normal, check CBC rule out viral syndrome.  With walking in the office her heart rate did increase which is reassuring however she is very symptomatic, would suggest follow up with Dr. Percival Spanish to get holter monitor.

## 2015-05-17 LAB — COMPREHENSIVE METABOLIC PANEL
ALT: 18 U/L (ref 0–35)
AST: 26 U/L (ref 0–37)
Albumin: 3.6 g/dL (ref 3.5–5.2)
Alkaline Phosphatase: 86 U/L (ref 39–117)
BUN: 12 mg/dL (ref 6–23)
CO2: 24 meq/L (ref 19–32)
Calcium: 8.9 mg/dL (ref 8.4–10.5)
Chloride: 102 mEq/L (ref 96–112)
Creat: 0.81 mg/dL (ref 0.50–1.10)
GLUCOSE: 90 mg/dL (ref 70–99)
POTASSIUM: 4.4 meq/L (ref 3.5–5.3)
Sodium: 136 mEq/L (ref 135–145)
TOTAL PROTEIN: 6.3 g/dL (ref 6.0–8.3)
Total Bilirubin: 1.3 mg/dL — ABNORMAL HIGH (ref 0.2–1.2)

## 2015-05-17 LAB — IRON AND TIBC
%SAT: 34 % (ref 20–55)
IRON: 108 ug/dL (ref 42–145)
TIBC: 320 ug/dL (ref 250–470)
UIBC: 212 ug/dL (ref 125–400)

## 2015-05-17 LAB — FERRITIN: FERRITIN: 42 ng/mL (ref 10–291)

## 2015-05-17 LAB — VITAMIN B12: Vitamin B-12: 705 pg/mL (ref 211–911)

## 2015-05-21 NOTE — Addendum Note (Signed)
Addended by: Abdullahi Vallone A on: 05/21/2015 10:15 AM   Modules accepted: Orders

## 2015-05-23 ENCOUNTER — Ambulatory Visit: Payer: Federal, State, Local not specified - PPO | Admitting: Podiatry

## 2015-08-04 ENCOUNTER — Encounter: Payer: Self-pay | Admitting: Cardiology

## 2015-08-04 ENCOUNTER — Ambulatory Visit (INDEPENDENT_AMBULATORY_CARE_PROVIDER_SITE_OTHER): Payer: Federal, State, Local not specified - PPO | Admitting: Cardiology

## 2015-08-04 VITALS — BP 168/78 | HR 60 | Ht 63.0 in | Wt 162.0 lb

## 2015-08-04 DIAGNOSIS — R001 Bradycardia, unspecified: Secondary | ICD-10-CM

## 2015-08-04 NOTE — Progress Notes (Signed)
HPI The patient presents for evaluation of bradycardia. I saw her last year for this as well. . She has had a couple of distant caths with the last one about 15 years ago and apparently normal. She did have a stress perfusion studies last in 2012. This demonstrated no evidence of ischemia or infarct. She is referred back here because of bradycardia noted on an EKG. She saw Alesia Richards, MD with a HR in the 40s. When I saw her last year did put on an event monitor and bradycardic episodes and occasional ventricular ectopy but no significant dysrhythmias. She will rarely feel wobbly on her feet but doesn't have any presyncope or syncope. She might at times get fatigued. However, she's very active taking care of her house and grandchildren and working. She did have one episode actually a couple of days ago her heart rate briefly went to 100.  This stopped very quickly. This otherwise is unusual.  She has had no other symptoms.  She denies chest pain or SOB. Of note I did repeat her recent primary care office note with the hypotension and dizziness but this all seems to have resolved.  Allergies  Allergen Reactions  . Codeine Other (See Comments)    "puts me out of it".  Dysphoria.  Patient tolerates Tramadol without complications.  . Nabumetone     GI upset    Current Outpatient Prescriptions  Medication Sig Dispense Refill  . aspirin 81 MG tablet Take 81 mg by mouth daily.    . Biotin 10 MG TABS Take 10,000 each by mouth daily.    . Calcium Citrate-Vitamin D (CALCIUM CITRATE + D3 PO) Take 600 mg by mouth daily.     . ergocalciferol (VITAMIN D2) 50000 UNITS capsule Take 50,000 Units by mouth every other day.    . Ferrous Sulfate (SLOW RELEASE IRON PO) Take by mouth daily.    . fish oil-omega-3 fatty acids 1000 MG capsule Take 1 g by mouth daily.    . fluticasone (FLONASE) 50 MCG/ACT nasal spray Place 2 sprays into both nostrils daily. 16 g 0  . levothyroxine (SYNTHROID, LEVOTHROID) 75  MCG tablet TAKE ONE TABLET BY MOUTH ONCE DAILY 90 tablet 99  . Magnesium 250 MG TABS Take by mouth daily.    . potassium gluconate 595 MG TABS Take 595 mg by mouth daily.     . pravastatin (PRAVACHOL) 40 MG tablet TAKE ONE TABLET BY MOUTH ONCE DAILY 90 tablet 0  . vitamin B-12 (CYANOCOBALAMIN) 1000 MCG tablet Take 1,000 mcg by mouth daily.     . Vitamin D, Ergocalciferol, (DRISDOL) 50000 UNITS CAPS capsule TAKE ONE CAPSULE BY MOUTH ONCE DAILY 30 capsule 2   No current facility-administered medications for this visit.    Past Medical History  Diagnosis Date  . Hypertension     no treatment after 120 lb weight loss  . Hyperlipidemia   . Hypothyroidism   . Thyroid nodule   . Allergic rhinitis   . Arthritis   . Cholecystitis, chronic   . Dysrhythmia     Hx bradycardia - hx normal cardiac cath x2 / ECHO 2009 EF 60% LAE MYOVIEW 3/12 EF 60% NO ISCHEMIA  . Prediabetes     Past Surgical History  Procedure Laterality Date  . Bilateral carpal tunnel surgery    . Replacement total knee bilateral      bilat  . Nasal sinus surgery    . Tubal ligation    . Breath tek h  pylori  07/01/2012    Procedure: BREATH TEK H PYLORI;  Surgeon: Edward Jolly, MD;  Location: [facility];  Service: General;  Laterality: N/A;  . 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 Cholecystectomy N/A 11/27/2013    Procedure: LAPAROSCOPIC CHOLECYSTECTOMY WITH INTRAOPERATIVE CHOLANGIOGRAM;  Surgeon: Pedro Earls, MD;  Location: [facility];  Service: General;  Laterality: N/A;    ROS:  As stated in the HPI and negative for all other systems.  PHYSICAL EXAM BP 168/78 mmHg  Pulse 60  Ht 5\' 3"  (1.6 m)  Wt 162 lb (73.483 kg)  BMI 28.70 kg/m2  LMP  (LMP Unknown) GENERAL:  Well appearing NECK:  No jugular venous distention, waveform within  normal limits, carotid upstroke brisk and symmetric, no bruits, no thyromegaly LUNGS:  Clear to auscultation bilaterally BACK:  No CVA tenderness CHEST:  Unremarkable HEART:  PMI not displaced or sustained,S1 and S2 within normal limits, no S3, no S4, no clicks, no rubs, no murmurs ABD:  Flat, positive bowel sounds normal in frequency in pitch, no bruits, no rebound, no guarding, no midline pulsatile mass, no hepatomegaly, no splenomegaly EXT:  2 plus pulses throughout, no edema, no cyanosis no clubbing   EKG:  Sinus bradycardia, rate 52, axis within normal limits, intervals within normal limits, poor anterior R-wave/. 05/16/15  ASSESSMENT AND PLAN  BRADYCARDIA:  She's had no overt symptoms related to this. She had no high-risk findings on her monitor. She had chronotropic competence when she walked around the office previously. Given this there is no indication for pacing or change in therapy. However, should she have any symptoms such as presyncope or syncope she would certainly let me know.  HTN:  She came off of her blood pressure medicines thinking these could be related to bradycardia. However, her blood pressures elevated today. She says at home her systolic is in the 224S. She is going to restart lisinopril at 10 mg daily and keep a blood pressure diary. She can either follow with me or MCKEOWN,WILLIAM DAVID, MD  over the phone to talk about whether she needs further medical adjustments based on these readings.  I do note that previous low blood pressures and concern from her primary care appointment.

## 2015-08-04 NOTE — Patient Instructions (Signed)
Your physician recommends that you schedule a follow-up appointment As Needed  

## 2015-08-10 ENCOUNTER — Ambulatory Visit (INDEPENDENT_AMBULATORY_CARE_PROVIDER_SITE_OTHER): Payer: Federal, State, Local not specified - PPO | Admitting: Physician Assistant

## 2015-08-10 ENCOUNTER — Encounter: Payer: Self-pay | Admitting: Physician Assistant

## 2015-08-10 VITALS — BP 120/70 | HR 62 | Temp 97.0°F | Resp 14 | Ht 63.0 in | Wt 157.0 lb

## 2015-08-10 DIAGNOSIS — Z79899 Other long term (current) drug therapy: Secondary | ICD-10-CM

## 2015-08-10 DIAGNOSIS — Z23 Encounter for immunization: Secondary | ICD-10-CM

## 2015-08-10 DIAGNOSIS — E559 Vitamin D deficiency, unspecified: Secondary | ICD-10-CM

## 2015-08-10 DIAGNOSIS — R7303 Prediabetes: Secondary | ICD-10-CM

## 2015-08-10 DIAGNOSIS — G2581 Restless legs syndrome: Secondary | ICD-10-CM

## 2015-08-10 DIAGNOSIS — E785 Hyperlipidemia, unspecified: Secondary | ICD-10-CM

## 2015-08-10 DIAGNOSIS — E039 Hypothyroidism, unspecified: Secondary | ICD-10-CM | POA: Diagnosis not present

## 2015-08-10 DIAGNOSIS — I10 Essential (primary) hypertension: Secondary | ICD-10-CM

## 2015-08-10 LAB — CBC WITH DIFFERENTIAL/PLATELET
BASOS PCT: 0 % (ref 0–1)
Basophils Absolute: 0 10*3/uL (ref 0.0–0.1)
EOS ABS: 0.2 10*3/uL (ref 0.0–0.7)
Eosinophils Relative: 4 % (ref 0–5)
HCT: 35.9 % — ABNORMAL LOW (ref 36.0–46.0)
HEMOGLOBIN: 11.8 g/dL — AB (ref 12.0–15.0)
LYMPHS ABS: 2.4 10*3/uL (ref 0.7–4.0)
Lymphocytes Relative: 45 % (ref 12–46)
MCH: 30.1 pg (ref 26.0–34.0)
MCHC: 32.9 g/dL (ref 30.0–36.0)
MCV: 91.6 fL (ref 78.0–100.0)
MONOS PCT: 8 % (ref 3–12)
MPV: 10.3 fL (ref 8.6–12.4)
Monocytes Absolute: 0.4 10*3/uL (ref 0.1–1.0)
NEUTROS ABS: 2.3 10*3/uL (ref 1.7–7.7)
Neutrophils Relative %: 43 % (ref 43–77)
PLATELETS: 277 10*3/uL (ref 150–400)
RBC: 3.92 MIL/uL (ref 3.87–5.11)
RDW: 14.1 % (ref 11.5–15.5)
WBC: 5.4 10*3/uL (ref 4.0–10.5)

## 2015-08-10 MED ORDER — ROPINIROLE HCL 0.5 MG PO TABS: ORAL_TABLET | ORAL | Status: DC

## 2015-08-10 NOTE — Progress Notes (Signed)
Assessment and Plan:  Hypertension: Continue medication, monitor blood pressure at home. Continue DASH diet.  Reminder to go to the ER if any CP, SOB, nausea, dizziness, severe HA, changes vision/speech, left arm numbness and tingling, and jaw pain. Cholesterol: Continue diet and exercise. Check cholesterol.  Pre-diabetes-Continue diet and exercise. Check A1C Vitamin D Def- check level and continue medications.  RLS- will try requip, given information, follow up 1 month.   Continue diet and meds as discussed. Further disposition pending results of labs.  HPI 64 y.o. female  presents for 3 month follow up with hypertension, hyperlipidemia, prediabetes and vitamin D, she had gastric bypass and has done a great job with weight loss getting off her meds.   Her blood pressure has been controlled at home, she is back on half of lisinopril 10mg  from Dr. Percival Spanish, today their BP is BP: 120/70 mmHg  She does not workout. She denies chest pain, shortness of breath, dizziness.  She is on cholesterol medication, pravastatin 40mg  1/2 pill every other day and denies myalgias. Her cholesterol is at goal. The cholesterol last visit was:   Lab Results  Component Value Date   CHOL 150 05/04/2015   HDL 89 05/04/2015   LDLCALC 52 05/04/2015   TRIG 47 05/04/2015   CHOLHDL 1.7 05/04/2015    She has been working on diet and exercise for prediabetes, and denies paresthesia of the feet, polydipsia, polyuria and visual disturbances. Last A1C in the office was:  Lab Results  Component Value Date   HGBA1C 5.8* 05/04/2015   Patient is on Vitamin D supplement, on 50,000 QOD.   Lab Results  Component Value Date   VD25OH 57 05/04/2015     She is on thyroid medication. Her medication was not changed last visit.   Lab Results  Component Value Date   TSH 1.116 05/04/2015  .  BMI is Body mass index is 27.82 kg/(m^2)., she is working on diet and exercise. Wt Readings from Last 3 Encounters:  08/10/15 157 lb  (71.215 kg)  08/04/15 162 lb (73.483 kg)  05/16/15 155 lb 3.2 oz (70.398 kg)   She still complains of bilateral leg pain, worse when she lies down at night, better when she moves them, will have some foot burning as well.  Lab Results  Component Value Date   IRON 108 05/16/2015   TIBC 320 05/16/2015   FERRITIN 42 05/16/2015   Lab Results  Component Value Date   [id number] 993 05/16/2015    Current Medications:  Current Outpatient Prescriptions on File Prior to Visit  Medication Sig Dispense Refill  . aspirin 81 MG tablet Take 81 mg by mouth daily.    . Biotin 10 MG TABS Take 10,000 each by mouth daily.    . Calcium Citrate-Vitamin D (CALCIUM CITRATE + D3 PO) Take 600 mg by mouth daily.     . ergocalciferol (VITAMIN D2) 50000 UNITS capsule Take 50,000 Units by mouth every other day.    . Ferrous Sulfate (SLOW RELEASE IRON PO) Take by mouth daily.    . fish oil-omega-3 fatty acids 1000 MG capsule Take 1 g by mouth daily.    . fluticasone (FLONASE) 50 MCG/ACT nasal spray Place 2 sprays into both nostrils daily. 16 g 0  . levothyroxine (SYNTHROID, LEVOTHROID) 75 MCG tablet TAKE ONE TABLET BY MOUTH ONCE DAILY 90 tablet 99  . Magnesium 250 MG TABS Take by mouth daily.    . potassium gluconate 595 MG TABS Take 595 mg by  mouth daily.     . pravastatin (PRAVACHOL) 40 MG tablet TAKE ONE TABLET BY MOUTH ONCE DAILY 90 tablet 0  . vitamin B-12 (CYANOCOBALAMIN) 1000 MCG tablet Take 1,000 mcg by mouth daily.     . Vitamin D, Ergocalciferol, (DRISDOL) 50000 UNITS CAPS capsule TAKE ONE CAPSULE BY MOUTH ONCE DAILY 30 capsule 2   No current facility-administered medications on file prior to visit.   Medical History:  Past Medical History  Diagnosis Date  . Hypertension     no treatment after 120 lb weight loss  . Hyperlipidemia   . Hypothyroidism   . Thyroid nodule   . Allergic rhinitis   . Arthritis   . Cholecystitis, chronic   . Dysrhythmia     Hx bradycardia - hx normal cardiac cath x2  / ECHO 2009 EF 60% LAE MYOVIEW 3/12 EF 60% NO ISCHEMIA  . Prediabetes    Allergies:  Allergies  Allergen Reactions  . Codeine Other (See Comments)    "puts me out of it".  Dysphoria.  Patient tolerates Tramadol without complications.  . Nabumetone     GI upset     Review of Systems:  Review of Systems  Constitutional: Negative.   HENT: Negative.   Eyes: Negative.   Respiratory: Negative.   Cardiovascular: Negative.   Gastrointestinal: Negative.   Genitourinary: Negative.   Musculoskeletal: Positive for myalgias (worse at night/rest, better with movement), back pain and neck pain. Negative for joint pain and falls.  Skin: Negative.   Neurological: Negative.   Endo/Heme/Allergies: Negative.   Psychiatric/Behavioral: Negative.     Family history- Review and unchanged Social history- Review and unchanged Physical Exam: BP 120/70 mmHg  Pulse 62  Temp(Src) 97 F (36.1 C) (Temporal)  Resp 14  Ht 5\' 3"  (1.6 m)  Wt 157 lb (71.215 kg)  BMI 27.82 kg/m2  SpO2 88%  LMP  (LMP Unknown) Wt Readings from Last 3 Encounters:  08/10/15 157 lb (71.215 kg)  08/04/15 162 lb (73.483 kg)  05/16/15 155 lb 3.2 oz (70.398 kg)   General Appearance: Well nourished, in no apparent distress. Eyes: PERRLA, EOMs, conjunctiva no swelling or erythema Sinuses: No Frontal/maxillary tenderness ENT/Mouth: Ext aud canals clear, TMs without erythema, bulging. No erythema, swelling, or exudate on post pharynx.  Tonsils not swollen or erythematous. Hearing normal.  Neck: Supple, thyroid normal.  Respiratory: Respiratory effort normal, BS equal bilaterally without rales, rhonchi, wheezing or stridor.  Cardio: RRR with no MRGs. Brisk peripheral pulses without edema.  Abdomen: Soft, + BS,  Non tender, no guarding, rebound, hernias, masses. Lymphatics: Non tender without lymphadenopathy.  Musculoskeletal: Full ROM, 5/5 strength, Normal gait.  Skin: Warm, dry without rashes, lesions, ecchymosis.  Neuro:  Cranial nerves intact. Normal muscle tone, no cerebellar symptoms. Psych: Awake and oriented X 3, normal affect, Insight and Judgment appropriate.    Vicie Mutters, PA-C 9:53 AM [facility]

## 2015-08-10 NOTE — Patient Instructions (Addendum)
You can try some of the at home treatments for Restless legs and we will check some labs on you looking for deficiencies that could contribute to it however we will have you start on :   Requip 0.25mg- start this dose about 1-2 hours BEFORE restless symptoms start.  The dose may be increased by 0.25 mg every two to three days until relief is obtained. Most patients require at least 2 mg, and doses up to 4 mg may be needed. Common adverse side effects: usually mild, transient, and limited to nausea, lightheadedness, and fatigue; these usually resolve within 10 to 14 days. Less frequent side effects include nasal stuffiness, constipation, and leg edema; these are reversible if the medication is stopped.   Restless Legs Syndrome Restless legs syndrome is a movement disorder. It may also be called a sensorimotor disorder.  CAUSES  No one knows what specifically causes restless legs syndrome, but it tends to run in families. It is also more common in people with low iron, in pregnancy, in people who need dialysis, and those with nerve damage (neuropathy).Some medications may make restless legs syndrome worse.Those medications include drugs to treat high blood pressure, some heart conditions, nausea, colds, allergies, and depression. SYMPTOMS Symptoms include uncomfortable sensations in the legs. These leg sensations are worse during periods of inactivity or rest. They are also worse while sitting or lying down. Individuals that have the disorder describe sensations in the legs that feel like:  Pulling.  Drawing.  Crawling.  Worming.  Boring.  Tingling.  Pins and needles.  Prickling.  Pain. The sensations are usually accompanied by an overwhelming urge to move the legs. Sudden muscle jerks may also occur. Movement provides temporary relief from the discomfort. In rare cases, the arms may also be affected. Symptoms may interfere with going to sleep (sleep onset insomnia). Restless legs  syndrome may also be related to periodic limb movement disorder (PLMD). PLMD is another more common motor disorder. It also causes interrupted sleep. The symptoms from PLMD usually occur most often when you are awake. TREATMENT  Treatment for restless legs syndrome is symptomatic. This means that the symptoms are treated.   Massage and cold compresses may provide temporary relief.  Walk, stretch, or take a cold or hot bath.  Get regular exercise and a good night's sleep.  Avoid caffeine, alcohol, nicotine, and medications that can make it worse.  Do activities that provide mental stimulation like discussions, needlework, and video games. These may be helpful if you are not able to walk or stretch. Some medications are effective in relieving the symptoms. However, many of these medications have side effects. Ask your caregiver about medications that may help your symptoms. Correcting iron deficiency may improve symptoms for some patients. Document Released: 10/05/2002 Document Revised: 03/01/2014 Document Reviewed: 01/11/2011 ExitCare Patient Information 2015 [employer]. This information is not intended to replace advice given to you by your health care provider. Make sure you discuss any questions you have with your health care provider. \  

## 2015-08-11 LAB — BASIC METABOLIC PANEL WITH GFR
BUN: 12 mg/dL (ref 7–25)
CO2: 28 mmol/L (ref 20–31)
Calcium: 9.3 mg/dL (ref 8.6–10.4)
Chloride: 104 mmol/L (ref 98–110)
Creat: 0.94 mg/dL (ref 0.50–0.99)
GFR, Est African American: 75 mL/min (ref >=60)
GFR, Est Non African American: 65 mL/min (ref >=60)
Glucose, Bld: 86 mg/dL (ref 65–99)
POTASSIUM: 3.7 mmol/L (ref 3.5–5.3)
SODIUM: 140 mmol/L (ref 135–146)

## 2015-08-11 LAB — HEPATIC FUNCTION PANEL
ALT: 19 U/L (ref 6–29)
AST: 26 U/L (ref 10–35)
Albumin: 3.8 g/dL (ref 3.6–5.1)
Alkaline Phosphatase: 102 U/L (ref 33–130)
BILIRUBIN DIRECT: 0.3 mg/dL — AB (ref <=0.2)
BILIRUBIN TOTAL: 1.3 mg/dL — AB (ref 0.2–1.2)
Indirect Bilirubin: 1 mg/dL (ref 0.2–1.2)
Total Protein: 6.4 g/dL (ref 6.1–8.1)

## 2015-08-11 LAB — LIPID PANEL
CHOL/HDL RATIO: 1.9 ratio (ref <=5.0)
CHOLESTEROL: 139 mg/dL (ref 125–200)
HDL: 73 mg/dL (ref >=46)
LDL Cholesterol: 54 mg/dL (ref <130)
Triglycerides: 58 mg/dL (ref <150)
VLDL: 12 mg/dL (ref <30)

## 2015-08-11 LAB — VITAMIN D 25 HYDROXY (VIT D DEFICIENCY, FRACTURES): VIT D 25 HYDROXY: 87 ng/mL (ref 30–100)

## 2015-08-11 LAB — MAGNESIUM: Magnesium: 1.9 mg/dL (ref 1.5–2.5)

## 2015-08-11 LAB — INSULIN, FASTING: Insulin fasting, serum: 2.8 u[IU]/mL (ref 2.0–19.6)

## 2015-08-11 LAB — TSH: TSH: 1.381 u[IU]/mL (ref 0.350–4.500)

## 2015-08-11 LAB — HEMOGLOBIN A1C
Hgb A1c MFr Bld: 5.6 % (ref <5.7)
MEAN PLASMA GLUCOSE: 114 mg/dL (ref <117)

## 2015-09-21 ENCOUNTER — Encounter: Payer: Self-pay | Admitting: Physician Assistant

## 2015-09-21 ENCOUNTER — Ambulatory Visit (INDEPENDENT_AMBULATORY_CARE_PROVIDER_SITE_OTHER): Payer: Federal, State, Local not specified - PPO | Admitting: Physician Assistant

## 2015-09-21 VITALS — BP 130/80 | HR 59 | Temp 97.3°F | Resp 14 | Ht 63.0 in | Wt 161.0 lb

## 2015-09-21 DIAGNOSIS — R519 Headache, unspecified: Secondary | ICD-10-CM

## 2015-09-21 DIAGNOSIS — R51 Headache: Secondary | ICD-10-CM

## 2015-09-21 DIAGNOSIS — R55 Syncope and collapse: Secondary | ICD-10-CM

## 2015-09-21 DIAGNOSIS — R110 Nausea: Secondary | ICD-10-CM

## 2015-09-21 DIAGNOSIS — R42 Dizziness and giddiness: Secondary | ICD-10-CM | POA: Diagnosis not present

## 2015-09-21 MED ORDER — TOPIRAMATE 25 MG PO TABS
25.0000 mg | ORAL_TABLET | Freq: Every day | ORAL | Status: DC
Start: 2015-09-21 — End: 2015-12-19

## 2015-09-21 MED ORDER — AZITHROMYCIN 250 MG PO TABS: ORAL_TABLET | ORAL | Status: AC

## 2015-09-21 NOTE — Patient Instructions (Signed)

## 2015-09-21 NOTE — Progress Notes (Signed)
Assessment and Plan: Presyncopal- normal holter 02/2014, normal echo, follows with cardio, can follow up with them, ? Need for tilt test ? Vasovagal , has history of migraines- will switch requip to topamax 25mg , declines referral to neuro for presyncopal Versus sinusitis/vertigo will treat with  Eat small frequent meals If it happens again go to ER  HPI 64 y.o.female presents for 1 month follow up for new medication add on. Added on requip for RLS last visit. Has had presyncopal episodes for the past year but has been worse the last month, has had 2 episodes. One time she was in [place] and she was sitting in chair and then most recently she was outside when it hits. She states at one time she had a spot in front of her eye that was fuzzy before the episode, then she feels dizzy, nausea, lightheaded, face tingling, weak/tired. She has checked her BP and sugar that is normal. Will last for several hours, she goes and lays down, and even the next day she will feel tired.   Past Medical History  Diagnosis Date  . Hypertension     no treatment after 120 lb weight loss  . Hyperlipidemia   . Hypothyroidism   . Thyroid nodule   . Allergic rhinitis   . Arthritis   . Cholecystitis, chronic   . Dysrhythmia     Hx bradycardia - hx normal cardiac cath x2 / ECHO 2009 EF 60% LAE MYOVIEW 3/12 EF 60% NO ISCHEMIA  . Prediabetes      Allergies  Allergen Reactions  . Codeine Other (See Comments)    "puts me out of it".  Dysphoria.  Patient tolerates Tramadol without complications.  . Nabumetone     GI upset      Current Outpatient Prescriptions on File Prior to Visit  Medication Sig Dispense Refill  . aspirin 81 MG tablet Take 81 mg by mouth daily.    . Biotin 10 MG TABS Take 10,000 each by mouth daily.    . Calcium Citrate-Vitamin D (CALCIUM CITRATE + D3 PO) Take 600 mg by mouth daily.     . ergocalciferol (VITAMIN D2) 50000 UNITS capsule Take 50,000 Units by mouth every other day.    .  Ferrous Sulfate (SLOW RELEASE IRON PO) Take by mouth daily.    . fish oil-omega-3 fatty acids 1000 MG capsule Take 1 g by mouth daily.    . fluticasone (FLONASE) 50 MCG/ACT nasal spray Place 2 sprays into both nostrils daily. 16 g 0  . levothyroxine (SYNTHROID, LEVOTHROID) 75 MCG tablet TAKE ONE TABLET BY MOUTH ONCE DAILY 90 tablet 99  . Magnesium 250 MG TABS Take by mouth daily.    . potassium gluconate 595 MG TABS Take 595 mg by mouth daily.     . pravastatin (PRAVACHOL) 40 MG tablet TAKE ONE TABLET BY MOUTH ONCE DAILY 90 tablet 0  . rOPINIRole (REQUIP) 0.5 MG tablet Start 1/2 pill at night, can increase to 1 pill, can go up to 4 pills at night. 90 tablet 0  . vitamin B-12 (CYANOCOBALAMIN) 1000 MCG tablet Take 1,000 mcg by mouth daily.     . Vitamin D, Ergocalciferol, (DRISDOL) 50000 UNITS CAPS capsule TAKE ONE CAPSULE BY MOUTH ONCE DAILY 30 capsule 2   No current facility-administered medications on file prior to visit.    ROS: all negative except above.   Physical Exam: Filed Weights   09/21/15 1034  Weight: 161 lb (73.029 kg)   BP 130/80 mmHg  Pulse 59  Temp(Src) 97.3 F (36.3 C) (Temporal)  Resp 14  Ht 5\' 3"  (1.6 m)  Wt 161 lb (73.029 kg)  BMI 28.53 kg/m2  SpO2 99%  LMP  (LMP Unknown) General Appearance: Well nourished, in no apparent distress. Eyes: PERRLA, EOMs, conjunctiva no swelling or erythema Sinuses: No Frontal/maxillary tenderness ENT/Mouth: Ext aud canals clear, TMs without erythema, bulging. No erythema, swelling, or exudate on post pharynx.  Tonsils not swollen or erythematous. Hearing normal.  Neck: Supple, thyroid normal.  Respiratory: Respiratory effort normal, BS equal bilaterally without rales, rhonchi, wheezing or stridor.  Cardio: RRR with no MRGs. Brisk peripheral pulses without edema.  Abdomen: Soft, + BS.  Non tender, no guarding, rebound, hernias, masses. Lymphatics: Non tender without lymphadenopathy.  Musculoskeletal: Full ROM, 5/5 strength,  normal gait.  Skin: Warm, dry without rashes, lesions, ecchymosis.  Neuro: Cranial nerves intact. Normal muscle tone, no cerebellar symptoms. Sensation intact.  Psych: Awake and oriented X 3, normal affect, Insight and Judgment appropriate.     Vicie Mutters, PA-C 10:42 AM [facility]

## 2015-10-19 ENCOUNTER — Ambulatory Visit (INDEPENDENT_AMBULATORY_CARE_PROVIDER_SITE_OTHER): Payer: Federal, State, Local not specified - PPO | Admitting: Physician Assistant

## 2015-10-19 ENCOUNTER — Encounter: Payer: Self-pay | Admitting: Physician Assistant

## 2015-10-19 VITALS — BP 122/70 | HR 55 | Temp 97.3°F | Resp 16 | Ht 63.0 in | Wt 159.0 lb

## 2015-10-19 DIAGNOSIS — R55 Syncope and collapse: Secondary | ICD-10-CM | POA: Diagnosis not present

## 2015-10-19 DIAGNOSIS — R42 Dizziness and giddiness: Secondary | ICD-10-CM | POA: Diagnosis not present

## 2015-10-19 DIAGNOSIS — R110 Nausea: Secondary | ICD-10-CM | POA: Diagnosis not present

## 2015-10-19 MED ORDER — BENZONATATE 200 MG PO CAPS: 200.0000 mg | ORAL_CAPSULE | Freq: Four times a day (QID) | ORAL | Status: DC | PRN

## 2015-10-19 MED ORDER — AZITHROMYCIN 250 MG PO TABS: ORAL_TABLET | ORAL | Status: AC

## 2015-10-19 NOTE — Progress Notes (Signed)
Assessment and Plan: Presyncopal episode- normal holter, echo, stress test, follows with cardio- possible need for tilt test versus migraine mediated episodes with possible aura before hand- increase topamax to whole pill, eat small frequent meals, will refer to neuro for evaluation but she declines at this time, if worsening symptoms go to ER.   Future Appointments Date Time Provider [place]  11/16/2015 10:45 AM [name], MD [facility] None    HPI 64 y.o.female presents for 1 month follow up. Her requip was switched to topamax 25mg  1/2 tablet for possible migraines/vasovagal episodes and she was given zpak for possible sinusitis. She states she has continued to have episodes of dizziness, can be sitting or standing, has had questionable aura preceding event with white halos, will have numb tingling face/arms bilateral, dizzy, occ diaphoresis, she will lay down and it will pass. Denies post ictal period. Has had normal echo, holter, and stress test. Has check BP and BS during episodes and they are normal.   Past Medical History  Diagnosis Date  . Hypertension     no treatment after 120 lb weight loss  . Hyperlipidemia   . Hypothyroidism   . Thyroid nodule   . Allergic rhinitis   . Arthritis   . Cholecystitis, chronic   . Dysrhythmia     Hx bradycardia - hx normal cardiac cath x2 / ECHO 2009 EF 60% LAE MYOVIEW 3/12 EF 60% NO ISCHEMIA  . Prediabetes      Allergies  Allergen Reactions  . Codeine Other (See Comments)    "puts me out of it".  Dysphoria.  Patient tolerates Tramadol without complications.  . Nabumetone     GI upset      Current Outpatient Prescriptions on File Prior to Visit  Medication Sig Dispense Refill  . aspirin 81 MG tablet Take 81 mg by mouth daily.    . Biotin 10 MG TABS Take 10,000 each by mouth daily.    . Calcium Citrate-Vitamin D (CALCIUM CITRATE + D3 PO) Take 600 mg by mouth daily.     . ergocalciferol (VITAMIN D2) 50000 UNITS capsule  Take 50,000 Units by mouth every other day.    . Ferrous Sulfate (SLOW RELEASE IRON PO) Take by mouth daily.    . fish oil-omega-3 fatty acids 1000 MG capsule Take 1 g by mouth daily.    . fluticasone (FLONASE) 50 MCG/ACT nasal spray Place 2 sprays into both nostrils daily. 16 g 0  . levothyroxine (SYNTHROID, LEVOTHROID) 75 MCG tablet TAKE ONE TABLET BY MOUTH ONCE DAILY 90 tablet 99  . Magnesium 250 MG TABS Take by mouth daily.    . potassium gluconate 595 MG TABS Take 595 mg by mouth daily.     . pravastatin (PRAVACHOL) 40 MG tablet TAKE ONE TABLET BY MOUTH ONCE DAILY 90 tablet 0  . topiramate (TOPAMAX) 25 MG tablet Take 1 tablet (25 mg total) by mouth at bedtime. 30 tablet 2  . vitamin B-12 (CYANOCOBALAMIN) 1000 MCG tablet Take 1,000 mcg by mouth daily.     . Vitamin D, Ergocalciferol, (DRISDOL) 50000 UNITS CAPS capsule TAKE ONE CAPSULE BY MOUTH ONCE DAILY 30 capsule 2   No current facility-administered medications on file prior to visit.    ROS: all negative except above.   Physical Exam: Filed Weights   10/19/15 0906  Weight: 159 lb (72.122 kg)   BP 122/70 mmHg  Pulse 55  Temp(Src) 97.3 F (36.3 C) (Temporal)  Resp 16  Ht 5\' 3"  (1.6 m)  Wt 159 lb (72.122 kg)  BMI 28.17 kg/m2  SpO2 94%  LMP  (LMP Unknown) General Appearance: Well nourished, in no apparent distress. Eyes: PERRLA, EOMs, conjunctiva no swelling or erythema Sinuses: No Frontal/maxillary tenderness ENT/Mouth: Ext aud canals clear, TMs without erythema, bulging. No erythema, swelling, or exudate on post pharynx.  Tonsils not swollen or erythematous. Hearing normal.  Neck: Supple, thyroid normal.  Respiratory: Respiratory effort normal, BS equal bilaterally without rales, rhonchi, wheezing or stridor.  Cardio: RRR with no MRGs. Brisk peripheral pulses without edema.  Abdomen: Soft, + BS.  Non tender, no guarding, rebound, hernias, masses. Lymphatics: Non tender without lymphadenopathy.  Musculoskeletal: Full ROM,  5/5 strength, normal gait.  Skin: Warm, dry without rashes, lesions, ecchymosis.  Neuro: Cranial nerves intact. Normal muscle tone, no cerebellar symptoms. Sensation intact.  Psych: Awake and oriented X 3, normal affect, Insight and Judgment appropriate.     Vicie Mutters, PA-C 9:11 AM [facility]

## 2015-10-19 NOTE — Patient Instructions (Signed)

## 2015-11-13 ENCOUNTER — Other Ambulatory Visit: Payer: Self-pay | Admitting: Internal Medicine

## 2015-11-16 ENCOUNTER — Encounter: Payer: Self-pay | Admitting: Internal Medicine

## 2015-11-16 ENCOUNTER — Ambulatory Visit (INDEPENDENT_AMBULATORY_CARE_PROVIDER_SITE_OTHER): Payer: Federal, State, Local not specified - PPO | Admitting: Internal Medicine

## 2015-11-16 VITALS — BP 134/84 | HR 60 | Temp 97.7°F | Resp 16 | Ht 63.25 in | Wt 153.0 lb

## 2015-11-16 DIAGNOSIS — G2581 Restless legs syndrome: Secondary | ICD-10-CM | POA: Diagnosis not present

## 2015-11-16 DIAGNOSIS — I10 Essential (primary) hypertension: Secondary | ICD-10-CM

## 2015-11-16 DIAGNOSIS — E039 Hypothyroidism, unspecified: Secondary | ICD-10-CM | POA: Diagnosis not present

## 2015-11-16 DIAGNOSIS — R7303 Prediabetes: Secondary | ICD-10-CM | POA: Diagnosis not present

## 2015-11-16 DIAGNOSIS — K219 Gastro-esophageal reflux disease without esophagitis: Secondary | ICD-10-CM

## 2015-11-16 DIAGNOSIS — E785 Hyperlipidemia, unspecified: Secondary | ICD-10-CM | POA: Diagnosis not present

## 2015-11-16 DIAGNOSIS — Z79899 Other long term (current) drug therapy: Secondary | ICD-10-CM

## 2015-11-16 DIAGNOSIS — N182 Chronic kidney disease, stage 2 (mild): Secondary | ICD-10-CM

## 2015-11-16 DIAGNOSIS — E1122 Type 2 diabetes mellitus with diabetic chronic kidney disease: Secondary | ICD-10-CM | POA: Diagnosis not present

## 2015-11-16 DIAGNOSIS — E559 Vitamin D deficiency, unspecified: Secondary | ICD-10-CM | POA: Diagnosis not present

## 2015-11-16 LAB — HEPATIC FUNCTION PANEL
ALK PHOS: 97 U/L (ref 33–130)
ALT: 15 U/L (ref 6–29)
AST: 21 U/L (ref 10–35)
Albumin: 3.6 g/dL (ref 3.6–5.1)
BILIRUBIN INDIRECT: 0.9 mg/dL (ref 0.2–1.2)
Bilirubin, Direct: 0.3 mg/dL — ABNORMAL HIGH (ref <=0.2)
TOTAL PROTEIN: 6 g/dL — AB (ref 6.1–8.1)
Total Bilirubin: 1.2 mg/dL (ref 0.2–1.2)

## 2015-11-16 LAB — CBC WITH DIFFERENTIAL/PLATELET
Basophils Absolute: 0 10*3/uL (ref 0.0–0.1)
Basophils Relative: 1 % (ref 0–1)
Eosinophils Absolute: 0.2 10*3/uL (ref 0.0–0.7)
Eosinophils Relative: 5 % (ref 0–5)
HEMATOCRIT: 36.7 % (ref 36.0–46.0)
HEMOGLOBIN: 12 g/dL (ref 12.0–15.0)
LYMPHS ABS: 2 10*3/uL (ref 0.7–4.0)
LYMPHS PCT: 44 % (ref 12–46)
MCH: 29.5 pg (ref 26.0–34.0)
MCHC: 32.7 g/dL (ref 30.0–36.0)
MCV: 90.2 fL (ref 78.0–100.0)
MONO ABS: 0.6 10*3/uL (ref 0.1–1.0)
MONOS PCT: 14 % — AB (ref 3–12)
MPV: 9.9 fL (ref 8.6–12.4)
NEUTROS ABS: 1.7 10*3/uL (ref 1.7–7.7)
Neutrophils Relative %: 36 % — ABNORMAL LOW (ref 43–77)
Platelets: 297 10*3/uL (ref 150–400)
RBC: 4.07 MIL/uL (ref 3.87–5.11)
RDW: 14 % (ref 11.5–15.5)
WBC: 4.6 10*3/uL (ref 4.0–10.5)

## 2015-11-16 LAB — BASIC METABOLIC PANEL WITH GFR
BUN: 16 mg/dL (ref 7–25)
CALCIUM: 8.9 mg/dL (ref 8.6–10.4)
CO2: 27 mmol/L (ref 20–31)
Chloride: 102 mmol/L (ref 98–110)
Creat: 1.06 mg/dL — ABNORMAL HIGH (ref 0.50–0.99)
GFR, EST NON AFRICAN AMERICAN: 56 mL/min — AB (ref >=60)
GFR, Est African American: 65 mL/min (ref >=60)
GLUCOSE: 83 mg/dL (ref 65–99)
Potassium: 3.1 mmol/L — ABNORMAL LOW (ref 3.5–5.3)
Sodium: 139 mmol/L (ref 135–146)

## 2015-11-16 LAB — MAGNESIUM: Magnesium: 1.9 mg/dL (ref 1.5–2.5)

## 2015-11-16 LAB — LIPID PANEL
CHOLESTEROL: 118 mg/dL — AB (ref 125–200)
HDL: 61 mg/dL (ref >=46)
LDL CALC: 45 mg/dL (ref <130)
Total CHOL/HDL Ratio: 1.9 Ratio (ref <=5.0)
Triglycerides: 61 mg/dL (ref <150)
VLDL: 12 mg/dL (ref <30)

## 2015-11-16 MED ORDER — RANITIDINE HCL 300 MG PO TABS: ORAL_TABLET | ORAL | Status: DC

## 2015-11-16 NOTE — Progress Notes (Signed)
Patient ID: Tracy Paul, female   DOB: [birth year], 65 y.o.   MRN: [id number]   This very nice 65 y.o. MWF presents for follow up with Hypertension, Hyperlipidemia, T2_NIDDM and Vitamin D Deficiency.    Patient is treated for HTN since 1995 & BP has been controlled at home. Today's BP: 134/84 mmHg. Patient had negative heart caths in 1995 & 2000.  Patient has had no complaints of any cardiac type chest pain, palpitations, dyspnea/orthopnea/PND, dizziness, claudication, or dependent edema.   Hyperlipidemia is controlled with diet & meds. Patient denies myalgias or other med SE's. Last Lipids were at goal with Cholesterol 118*; HDL 61; LDL 45; Triglycerides 61 on 11/16/2015.   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  Last A1c was 5.6% on 08/10/2015.     Further, the patient also has history of Vitamin D Deficiency and supplements vitamin D without any suspected side-effects. Last vitamin D was 87 on 08/10/2015.   Medication Sig  . aspirin 81 MG tablet Take 81 mg by mouth daily.  . Biotin 10 MG TABS Take 10,000 each by mouth daily.  Marland Kitchen CALCIUM CITRATE + D3  Take 600 mg by mouth daily.   Marland Kitchen SLOW RELEASE IRON  Take by mouth daily.  . fish oil-omega-3  1000 MG  Take 1 g by mouth daily.  Marland Kitchen FLONASE nasal spray Place 2 sprays into both nostrils daily.  . Magnesium 250 MG TABS Take by mouth daily.  . potassium gluconate 595 MG  Take 595 mg by mouth daily.   . pravastatin  40 MG tablet TAKE ONE TABLET BY MOUTH ONCE DAILY  . topiramate 25 MG tablet Take 1 tablet (25 mg total) by mouth at bedtime.  . vitamin B-12  1000 MCG tablet Take 1,000 mcg by mouth daily.   . Vitamin D 50000 units CAPS  TAKE ONE CAPSULE BY MOUTH ONCE DAILY   Allergies  Allergen Reactions  . Codeine  Other (See Comments)    "puts me out of it".  Dysphoria.  Patient tolerates Tramadol without complications.  . Nabumetone     GI upset   PMHx:   Past Medical History  Diagnosis Date  . Hypertension     no treatment after 120 lb weight loss  . Hyperlipidemia   . Hypothyroidism   . Thyroid nodule   . Allergic rhinitis   . Arthritis   . Cholecystitis, chronic   . Dysrhythmia     Hx bradycardia - hx normal cardiac cath x2 / ECHO 2009 EF 60% LAE MYOVIEW 3/12 EF 60% NO ISCHEMIA  . Prediabetes    Immunization History  Administered Date(s) Administered  . Influenza Split 08/25/2013, 09/02/2014, 08/10/2015  . PPD Test 02/23/2014  . Pneumococcal Polysaccharide-23 10/18/2008  . Td 08/08/2006  . Zoster 10/15/2012   Past Surgical History  Procedure Laterality Date  . Bilateral carpal tunnel surgery    . Replacement total knee bilateral      bilat  . Nasal sinus surgery    . Tubal ligation    . Breath tek h pylori  07/01/2012    Procedure: BREATH TEK H PYLORI;  Surgeon: Edward Jolly, MD;  Location: [facility];  Service: General;  Laterality: N/A;  . 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 Cholecystectomy N/A 11/27/2013    Procedure: LAPAROSCOPIC CHOLECYSTECTOMY WITH INTRAOPERATIVE CHOLANGIOGRAM;  Surgeon: Pedro Earls, MD;  Location: [facility];  Service: General;  Laterality: N/A;   FHx:    Reviewed / unchanged  SHx:    Reviewed / unchanged  Systems Review:  Constitutional: Denies fever, chills, wt changes, headaches, insomnia, fatigue, night sweats, change in appetite. Eyes: Denies redness, blurred vision, diplopia, discharge, itchy, watery eyes.  ENT: Denies discharge, congestion, post nasal drip, epistaxis, sore throat, earache, hearing loss, dental pain, tinnitus, vertigo, sinus pain, snoring.  CV:  Denies chest pain, palpitations, irregular heartbeat, syncope, dyspnea, diaphoresis, orthopnea, PND, claudication or edema. Respiratory: denies cough, dyspnea, DOE, pleurisy, hoarseness, laryngitis, wheezing.  Gastrointestinal: Denies dysphagia, odynophagia, heartburn, reflux, water brash, abdominal pain or cramps, nausea, vomiting, bloating, diarrhea, constipation, hematemesis, melena, hematochezia  or hemorrhoids. Genitourinary: Denies dysuria, frequency, urgency, nocturia, hesitancy, discharge, hematuria or flank pain. Musculoskeletal: Denies arthralgias, myalgias, stiffness, jt. swelling, pain, limping or strain/sprain.  Skin: Denies pruritus, rash, hives, warts, acne, eczema or change in skin lesion(s). Neuro: No weakness, tremor, incoordination, spasms, paresthesia or pain. Psychiatric: Denies confusion, memory loss or sensory loss. Endo: Denies change in weight, skin or hair change.  Heme/Lymph: No excessive bleeding, bruising or enlarged lymph nodes.  Physical Exam  BP 134/84 mmHg  Pulse 60  Temp(Src) 97.7 F (36.5 C)  Resp 16  Ht 5' 3.25" (1.607 m)  Wt 153 lb (69.4 kg)  BMI 26.87 kg/m2  LMP  (LMP Unknown)  Appears well nourished and in no distress. Eyes: PERRLA, EOMs, conjunctiva no swelling or erythema. Sinuses: No frontal/maxillary tenderness ENT/Mouth: EAC's clear, TM's nl w/o erythema, bulging. Nares clear w/o erythema, swelling, exudates. Oropharynx clear without erythema or exudates. Oral hygiene is good. Tongue normal, non obstructing. Hearing intact.  Neck: Supple. Thyroid nl. Car 2+/2+ without bruits, nodes or JVD. Chest: Respirations nl with BS clear & equal w/o rales, rhonchi, wheezing or stridor.  Cor: Heart sounds normal w/ regular rate and rhythm without sig. murmurs, gallops, clicks, or rubs. Peripheral pulses normal and equal  without edema.  Abdomen: Soft & bowel sounds normal. Non-tender w/o guarding, rebound, hernias, masses, or organomegaly.  Lymphatics:  Unremarkable.  Musculoskeletal: Full ROM all peripheral extremities, joint stability, 5/5 strength, and normal gait.  Skin: Warm, dry without exposed rashes, lesions or ecchymosis apparent.  Neuro: Cranial nerves intact, reflexes equal bilaterally. Sensory-motor testing grossly intact. Tendon reflexes grossly intact.  Pysch: Alert & oriented x 3.  Insight and judgement nl & appropriate. No ideations.  Assessment and Plan:  1. Essential hypertension  - TSH  2. Hyperlipidemia  - Lipid panel  3. Prediabetes  - Hemoglobin A1c - Insulin, random  4. Vitamin D deficiency  - VITAMIN D 25 Hydroxy  5. 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 Hypothyroidism  - TSH  7. Gastroesophageal reflux disease   8. Medication management  - CBC with Differential/Platelet - BASIC METABOLIC PANEL WITH GFR - Hepatic function panel - Magnesium  9. RLS (restless legs syndrome)  - rOPINIRole (REQUIP) 0.5 MG tablet;      Recommended regular exercise, BP monitoring, weight control, and discussed med and SE's. Recommended labs to assess and monitor clinical status. Further disposition pending results of labs. Over 30 minutes of exam, counseling, chart review was performed

## 2015-11-16 NOTE — Patient Instructions (Signed)

## 2015-11-17 LAB — HEMOGLOBIN A1C
HEMOGLOBIN A1C: 5.9 % — AB (ref <5.7)
MEAN PLASMA GLUCOSE: 123 mg/dL — AB (ref <117)

## 2015-11-17 LAB — TSH: TSH: 0.806 u[IU]/mL (ref 0.350–4.500)

## 2015-11-17 LAB — VITAMIN D 25 HYDROXY (VIT D DEFICIENCY, FRACTURES): VIT D 25 HYDROXY: 92 ng/mL (ref 30–100)

## 2015-11-17 LAB — INSULIN, RANDOM: Insulin: 2 u[IU]/mL (ref 2.0–19.6)

## 2015-12-19 ENCOUNTER — Other Ambulatory Visit: Payer: Self-pay | Admitting: Internal Medicine

## 2015-12-19 ENCOUNTER — Other Ambulatory Visit: Payer: Self-pay | Admitting: Physician Assistant

## 2015-12-19 ENCOUNTER — Encounter: Payer: Self-pay | Admitting: Physician Assistant

## 2015-12-19 ENCOUNTER — Ambulatory Visit (INDEPENDENT_AMBULATORY_CARE_PROVIDER_SITE_OTHER): Payer: Federal, State, Local not specified - PPO | Admitting: Physician Assistant

## 2015-12-19 VITALS — BP 132/70 | HR 70 | Temp 98.1°F | Resp 16 | Ht 63.25 in | Wt 146.0 lb

## 2015-12-19 DIAGNOSIS — R058 Other specified cough: Secondary | ICD-10-CM

## 2015-12-19 DIAGNOSIS — R509 Fever, unspecified: Secondary | ICD-10-CM

## 2015-12-19 DIAGNOSIS — R05 Cough: Secondary | ICD-10-CM

## 2015-12-19 DIAGNOSIS — R350 Frequency of micturition: Secondary | ICD-10-CM

## 2015-12-19 DIAGNOSIS — M791 Myalgia, unspecified site: Secondary | ICD-10-CM

## 2015-12-19 DIAGNOSIS — Z79899 Other long term (current) drug therapy: Secondary | ICD-10-CM | POA: Diagnosis not present

## 2015-12-19 LAB — CBC WITH DIFFERENTIAL/PLATELET
Basophils Absolute: 0 10*3/uL (ref 0.0–0.1)
Basophils Relative: 1 % (ref 0–1)
Eosinophils Absolute: 0.2 10*3/uL (ref 0.0–0.7)
Eosinophils Relative: 6 % — ABNORMAL HIGH (ref 0–5)
HEMATOCRIT: 37 % (ref 36.0–46.0)
HEMOGLOBIN: 12.3 g/dL (ref 12.0–15.0)
LYMPHS PCT: 27 % (ref 12–46)
Lymphs Abs: 1 10*3/uL (ref 0.7–4.0)
MCH: 29.6 pg (ref 26.0–34.0)
MCHC: 33.2 g/dL (ref 30.0–36.0)
MCV: 89.2 fL (ref 78.0–100.0)
MONO ABS: 0.3 10*3/uL (ref 0.1–1.0)
MONOS PCT: 7 % (ref 3–12)
MPV: 10 fL (ref 8.6–12.4)
NEUTROS ABS: 2.1 10*3/uL (ref 1.7–7.7)
Neutrophils Relative %: 59 % (ref 43–77)
Platelets: 272 10*3/uL (ref 150–400)
RBC: 4.15 MIL/uL (ref 3.87–5.11)
RDW: 14 % (ref 11.5–15.5)
WBC: 3.6 10*3/uL — ABNORMAL LOW (ref 4.0–10.5)

## 2015-12-19 MED ORDER — HYDROCODONE-ACETAMINOPHEN 5-325 MG PO TABS: 1.0000 | ORAL_TABLET | Freq: Four times a day (QID) | ORAL | Status: DC | PRN

## 2015-12-19 MED ORDER — PREDNISONE 20 MG PO TABS: ORAL_TABLET | ORAL | Status: DC

## 2015-12-19 NOTE — Progress Notes (Signed)
Subjective:    Patient ID: Tracy Paul, female    DOB: October 05, 1951, 65 y.o.   MRN: [id number]  HPI 65 y.o. WF with history of HTN, hypothyroidism, DM2 presents with feeling sick x 5 days. She has had fever, chills, neck pain, back pain/myaglias, numbness/tingling in her fingers and toes, "hurts all over"   Blood pressure 132/70, pulse 70, temperature 98.1 F (36.7 C), temperature source Temporal, resp. rate 16, height 5' 3.25" (1.607 m), weight 146 lb (66.225 kg), SpO2 100 %.  Past Medical History  Diagnosis Date  . Hypertension     no treatment after 120 lb weight loss  . Hyperlipidemia   . Hypothyroidism   . Thyroid nodule   . Allergic rhinitis   . Arthritis   . Cholecystitis, chronic   . Dysrhythmia     Hx bradycardia - hx normal cardiac cath x2 / ECHO 2009 EF 60% LAE MYOVIEW 3/12 EF 60% NO ISCHEMIA  . Prediabetes    Current Outpatient Prescriptions on File Prior to Visit  Medication Sig Dispense Refill  . aspirin 81 MG tablet Take 81 mg by mouth daily.    . Biotin 10 MG TABS Take 10,000 each by mouth daily.    . Calcium Citrate-Vitamin D (CALCIUM CITRATE + D3 PO) Take 600 mg by mouth daily.     . Ferrous Sulfate (SLOW RELEASE IRON PO) Take by mouth daily.    . fish oil-omega-3 fatty acids 1000 MG capsule Take 1 g by mouth daily.    . fluticasone (FLONASE) 50 MCG/ACT nasal spray Place 2 sprays into both nostrils daily. 16 g 0  . Magnesium 250 MG TABS Take by mouth daily.    . potassium gluconate 595 MG TABS Take 595 mg by mouth daily.     . ranitidine (ZANTAC) 300 MG tablet Take 1 to 2 tablets daily for heartburn & reflux 180 tablet 1  . rOPINIRole (REQUIP) 0.5 MG tablet     . topiramate (TOPAMAX) 25 MG tablet Take 1 tablet (25 mg total) by mouth at bedtime. 30 tablet 2  . vitamin B-12 (CYANOCOBALAMIN) 1000 MCG tablet Take 1,000 mcg by mouth daily.     . Vitamin D, Ergocalciferol, (DRISDOL) 50000 units CAPS capsule TAKE ONE CAPSULE BY MOUTH ONCE DAILY 90 capsule 1   No  current facility-administered medications on file prior to visit.    Review of Systems  Constitutional: Positive for fever and chills. Negative for diaphoresis, activity change, appetite change, fatigue and unexpected weight change.  HENT: Negative.   Respiratory: Positive for cough. Negative for apnea, choking, chest tightness, shortness of breath, wheezing and stridor.   Cardiovascular: Negative.   Gastrointestinal: Positive for abdominal pain. Negative for nausea, vomiting, diarrhea, constipation, blood in stool, abdominal distention, anal bleeding and rectal pain.  Genitourinary: Negative.   Musculoskeletal: Positive for myalgias, back pain, arthralgias, neck pain and neck stiffness. Negative for joint swelling and gait problem.  Neurological: Negative.  Negative for dizziness.  Hematological: Negative.   Psychiatric/Behavioral: Negative.        Objective:   Physical Exam  Constitutional: She is oriented to person, place, and time. She appears well-developed and well-nourished.  HENT:  Head: Normocephalic.  Right Ear: External ear normal.  Left Ear: External ear normal.  Mouth/Throat: No oropharyngeal exudate.  Eyes: Conjunctivae are normal. Pupils are equal, round, and reactive to light.  Neck: Normal range of motion. Neck supple. Muscular tenderness present. No rigidity. No Brudzinski's sign and no Kernig's sign noted.  No thyroid mass present.  Cardiovascular: Regular rhythm.   No murmur heard. Sinus bradycardia  Pulmonary/Chest: Effort normal and breath sounds normal. She has no wheezes.  Abdominal: Soft. Bowel sounds are normal. She exhibits no distension and no mass. There is tenderness (RLQ). There is no rebound and no guarding.  Musculoskeletal: Normal range of motion. She exhibits tenderness (diffuse).  Neurological: She is alert and oriented to person, place, and time. No cranial nerve deficit.  Skin: Skin is warm and dry. No rash noted.       Assessment & Plan:   Fever, chills, myaglias- dry cough/lower back pain Get CBC, stop pravastatin, check CPK, check CMET, urine, CXR Prednisone, norco 5 #30 given to patient, will call with results tomorrow Go to ER if pain worsens.

## 2015-12-19 NOTE — Patient Instructions (Addendum)
Stop the pravastatin Please take the prednisone to help decrease inflammation and therefore decrease symptoms. Take it it with food to avoid GI upset. It can cause increased energy but on the other hand it can make it hard to sleep at night so please take it AT [place], it takes 8-12 hours to start working so it will NOT affect your sleeping if you take it at night with your food!!  If you are diabetic it will increase your sugars so decrease carbs and monitor your sugars closely.     Fever, Adult A fever is an increase in the body's temperature. It is usually defined as a temperature of 100F (38C) or higher. Brief mild or moderate fevers generally have no long-term effects, and they often do not require treatment. Moderate or high fevers may make you feel uncomfortable and can sometimes be a sign of a serious illness or disease. The sweating that may occur with repeated or prolonged fever may also cause dehydration. Fever is confirmed by taking a temperature with a thermometer. A measured temperature can vary with:  Age.  Time of day.  Location of the thermometer:  Mouth (oral).  Rectum (rectal).  Ear (tympanic).  Underarm (axillary).  Forehead (temporal). HOME CARE INSTRUCTIONS Pay attention to any changes in your symptoms. Take these actions to help with your condition:  Take over-the counter and prescription medicines only as told by your health care provider. Follow the dosing instructions carefully.  If you were prescribed an antibiotic medicine, take it as told by your health care provider. Do not stop taking the antibiotic even if you start to feel better.  Rest as needed.  Drink enough fluid to keep your urine clear or pale yellow. This helps to prevent dehydration.  Sponge yourself or bathe with room-temperature water to help reduce your body temperature as needed. Do not use ice water.  Do not overbundle yourself in blankets or heavy clothes. SEEK MEDICAL  CARE IF:  You vomit.  You cannot eat or drink without vomiting.  You have diarrhea.  You have pain when you urinate.  Your symptoms do not improve with treatment.  You develop new symptoms.  You develop excessive weakness. SEEK IMMEDIATE MEDICAL CARE IF:  You have shortness of breath or have trouble breathing.  You are dizzy or you faint.  You are disoriented or confused.  You develop signs of dehydration, such as a dry mouth, decreased urination, or paleness.  You develop severe pain in your abdomen.  You have persistent vomiting or diarrhea.  You develop a skin rash.  Your symptoms suddenly get worse.   This information is not intended to replace advice given to you by your health care provider. Make sure you discuss any questions you have with your health care provider.   Document Released: 04/10/2001 Document Revised: 07/06/2015 Document Reviewed: 12/09/2014 Elsevier Interactive Patient Education [employer].

## 2015-12-20 ENCOUNTER — Ambulatory Visit (INDEPENDENT_AMBULATORY_CARE_PROVIDER_SITE_OTHER): Payer: Federal, State, Local not specified - PPO | Admitting: Physician Assistant

## 2015-12-20 ENCOUNTER — Encounter: Payer: Self-pay | Admitting: Physician Assistant

## 2015-12-20 ENCOUNTER — Ambulatory Visit (HOSPITAL_COMMUNITY)
Admission: RE | Admit: 2015-12-20 | Discharge: 2015-12-20 | Disposition: A | Discharge status: Home / Self Care | Payer: Federal, State, Local not specified - PPO | Source: Ambulatory Visit | Attending: Physician Assistant | Admitting: Physician Assistant

## 2015-12-20 VITALS — BP 130/80 | HR 88 | Temp 97.5°F | Resp 16 | Ht 63.25 in | Wt 149.0 lb

## 2015-12-20 DIAGNOSIS — M791 Myalgia, unspecified site: Secondary | ICD-10-CM

## 2015-12-20 DIAGNOSIS — R058 Other specified cough: Secondary | ICD-10-CM

## 2015-12-20 DIAGNOSIS — E876 Hypokalemia: Secondary | ICD-10-CM | POA: Diagnosis not present

## 2015-12-20 DIAGNOSIS — R05 Cough: Secondary | ICD-10-CM | POA: Diagnosis not present

## 2015-12-20 DIAGNOSIS — R509 Fever, unspecified: Secondary | ICD-10-CM | POA: Insufficient documentation

## 2015-12-20 DIAGNOSIS — R0989 Other specified symptoms and signs involving the circulatory and respiratory systems: Secondary | ICD-10-CM | POA: Insufficient documentation

## 2015-12-20 DIAGNOSIS — R748 Abnormal levels of other serum enzymes: Secondary | ICD-10-CM | POA: Diagnosis not present

## 2015-12-20 DIAGNOSIS — R1010 Upper abdominal pain, unspecified: Secondary | ICD-10-CM

## 2015-12-20 LAB — CBC WITH DIFFERENTIAL/PLATELET
BASOS ABS: 0 10*3/uL (ref 0.0–0.1)
BASOS PCT: 0 % (ref 0–1)
EOS ABS: 0.1 10*3/uL (ref 0.0–0.7)
EOS PCT: 1 % (ref 0–5)
HCT: 36.1 % (ref 36.0–46.0)
Hemoglobin: 12.3 g/dL (ref 12.0–15.0)
Lymphocytes Relative: 26 % (ref 12–46)
Lymphs Abs: 2.1 10*3/uL (ref 0.7–4.0)
MCH: 30.4 pg (ref 26.0–34.0)
MCHC: 34.1 g/dL (ref 30.0–36.0)
MCV: 89.4 fL (ref 78.0–100.0)
MONO ABS: 0.7 10*3/uL (ref 0.1–1.0)
MPV: 9.6 fL (ref 8.6–12.4)
Monocytes Relative: 9 % (ref 3–12)
Neutro Abs: 5.2 10*3/uL (ref 1.7–7.7)
Neutrophils Relative %: 64 % (ref 43–77)
PLATELETS: 293 10*3/uL (ref 150–400)
RBC: 4.04 MIL/uL (ref 3.87–5.11)
RDW: 13.8 % (ref 11.5–15.5)
WBC: 8.1 10*3/uL (ref 4.0–10.5)

## 2015-12-20 LAB — HEPATIC FUNCTION PANEL
ALT: 210 U/L — AB (ref 6–29)
AST: 265 U/L — ABNORMAL HIGH (ref 10–35)
Albumin: 3.6 g/dL (ref 3.6–5.1)
Alkaline Phosphatase: 314 U/L — ABNORMAL HIGH (ref 33–130)
Bilirubin, Direct: 0.3 mg/dL — ABNORMAL HIGH (ref <=0.2)
Indirect Bilirubin: 0.8 mg/dL (ref 0.2–1.2)
TOTAL PROTEIN: 6.5 g/dL (ref 6.1–8.1)
Total Bilirubin: 1.1 mg/dL (ref 0.2–1.2)

## 2015-12-20 LAB — BASIC METABOLIC PANEL WITH GFR
BUN: 17 mg/dL (ref 7–25)
CALCIUM: 8.6 mg/dL (ref 8.6–10.4)
CO2: 26 mmol/L (ref 20–31)
CREATININE: 1.09 mg/dL — AB (ref 0.50–0.99)
Chloride: 100 mmol/L (ref 98–110)
GFR, Est African American: 62 mL/min (ref >=60)
GFR, Est Non African American: 54 mL/min — ABNORMAL LOW (ref >=60)
GLUCOSE: 93 mg/dL (ref 65–99)
Potassium: 3.3 mmol/L — ABNORMAL LOW (ref 3.5–5.3)
Sodium: 138 mmol/L (ref 135–146)

## 2015-12-20 LAB — HEPATITIS PANEL, ACUTE
HCV AB: NEGATIVE
HEP B S AG: NEGATIVE
Hep A IgM: NONREACTIVE
Hep B C IgM: NONREACTIVE

## 2015-12-20 LAB — EPSTEIN-BARR VIRUS VCA ANTIBODY PANEL
EBV EA IgG: 12.6 U/mL — ABNORMAL HIGH
EBV NA IgG: 572 U/mL — ABNORMAL HIGH
EBV VCA IgG: 268 U/mL — ABNORMAL HIGH
EBV VCA IgM: <10 U/mL

## 2015-12-20 LAB — URINALYSIS, ROUTINE W REFLEX MICROSCOPIC
BILIRUBIN URINE: NEGATIVE
Glucose, UA: NEGATIVE
Hgb urine dipstick: NEGATIVE
KETONES UR: NEGATIVE
Leukocytes, UA: NEGATIVE
Nitrite: NEGATIVE
Protein, ur: NEGATIVE
SPECIFIC GRAVITY, URINE: 1.02 (ref 1.001–1.035)
pH: 5.5 (ref 5.0–8.0)

## 2015-12-20 LAB — URINE CULTURE
Colony Count: NO GROWTH
Organism ID, Bacteria: NO GROWTH

## 2015-12-20 LAB — MAGNESIUM: MAGNESIUM: 2 mg/dL (ref 1.5–2.5)

## 2015-12-20 LAB — CK: CK TOTAL: 35 U/L (ref 7–177)

## 2015-12-20 NOTE — Patient Instructions (Signed)
Stop topamax, pravastatin, no tylenol okay to take ibuprofen/aleve  Go to the ER if severe pain, can't hold anything down/ N/V  Increase potassium to 2 a day

## 2015-12-20 NOTE — Progress Notes (Signed)
Subjective:    Patient ID: Tracy Paul, female    DOB: July 22, 1951, 65 y.o.   MRN: [id number]  HPI 65 y.o. WF with history of HTN, hypothyroidism, DM2 presents with feeling sick x 5 days, came in yesterday and had grossly elevated LFTs. She has had fever, chills, neck pain, back pain/myaglias, numbness/tingling in her fingers and toes, "hurts all over" She states her neck is doing better, had gross diaphoresis last night while sleeping. Still has appetite, no diarrhea, constipation, nausea.   Lab Results  Component Value Date   ALT 210* 12/19/2015   AST 265* 12/19/2015   ALKPHOS 314* 12/19/2015   BILITOT 1.1 12/19/2015    Blood pressure 130/80, pulse 88, temperature 97.5 F (36.4 C), temperature source Temporal, resp. rate 16, height 5' 3.25" (1.607 m), weight 149 lb (67.586 kg), SpO2 99 %.  Past Medical History  Diagnosis Date  . Hypertension     no treatment after 120 lb weight loss  . Hyperlipidemia   . Hypothyroidism   . Thyroid nodule   . Allergic rhinitis   . Arthritis   . Cholecystitis, chronic   . Dysrhythmia     Hx bradycardia - hx normal cardiac cath x2 / ECHO 2009 EF 60% LAE MYOVIEW 3/12 EF 60% NO ISCHEMIA  . Prediabetes    Current Outpatient Prescriptions on File Prior to Visit  Medication Sig Dispense Refill  . aspirin 81 MG tablet Take 81 mg by mouth daily.    . Biotin 10 MG TABS Take 10,000 each by mouth daily.    . Calcium Citrate-Vitamin D (CALCIUM CITRATE + D3 PO) Take 600 mg by mouth daily.     . Ferrous Sulfate (SLOW RELEASE IRON PO) Take by mouth daily.    . fish oil-omega-3 fatty acids 1000 MG capsule Take 1 g by mouth daily.    . fluticasone (FLONASE) 50 MCG/ACT nasal spray Place 2 sprays into both nostrils daily. 16 g 0  . hydrochlorothiazide (HYDRODIURIL) 25 MG tablet TAKE ONE TABLET BY MOUTH ONCE DAILY FOR BLOOD PRESSURE AND  FLUID 90 tablet 1  . HYDROcodone-acetaminophen (NORCO) 5-325 MG tablet Take 1 tablet by mouth every 6 (six) hours as  needed for moderate pain (Cough). Max: 4 tablets a day 30 tablet 0  . Magnesium 250 MG TABS Take by mouth daily.    . potassium gluconate 595 MG TABS Take 595 mg by mouth daily.     . pravastatin (PRAVACHOL) 40 MG tablet TAKE ONE TABLET BY MOUTH ONCE DAILY 90 tablet 2  . predniSONE (DELTASONE) 20 MG tablet 2 tablets daily for 3 days, 1 tablet daily for 4 days. 10 tablet 0  . ranitidine (ZANTAC) 300 MG tablet Take 1 to 2 tablets daily for heartburn & reflux 180 tablet 1  . rOPINIRole (REQUIP) 0.5 MG tablet     . topiramate (TOPAMAX) 25 MG tablet TAKE ONE TABLET BY MOUTH AT BEDTIME 30 tablet 0  . vitamin B-12 (CYANOCOBALAMIN) 1000 MCG tablet Take 1,000 mcg by mouth daily.     . Vitamin D, Ergocalciferol, (DRISDOL) 50000 units CAPS capsule TAKE ONE CAPSULE BY MOUTH ONCE DAILY 90 capsule 1   No current facility-administered medications on file prior to visit.    Review of Systems  Constitutional: Positive for chills, diaphoresis (last night bed was "soaked through" with sweat) and fatigue. Negative for fever, activity change, appetite change and unexpected weight change.  HENT: Negative.   Respiratory: Positive for cough. Negative for apnea, choking, chest  tightness, shortness of breath, wheezing and stridor.   Cardiovascular: Negative.   Gastrointestinal: Positive for abdominal pain. Negative for nausea, vomiting, diarrhea, constipation, blood in stool, abdominal distention, anal bleeding and rectal pain.  Genitourinary: Negative.   Musculoskeletal: Positive for myalgias, back pain and arthralgias. Negative for joint swelling, gait problem, neck pain and neck stiffness.  Neurological: Negative.  Negative for dizziness.  Hematological: Negative.   Psychiatric/Behavioral: Negative.        Objective:   Physical Exam  Constitutional: She is oriented to person, place, and time. She appears well-developed and well-nourished.  HENT:  Head: Normocephalic.  Right Ear: External ear normal.  Left  Ear: External ear normal.  Mouth/Throat: No oropharyngeal exudate.  Eyes: Conjunctivae are normal. Pupils are equal, round, and reactive to light.  Neck: Normal range of motion. Neck supple. Muscular tenderness present. No rigidity. No Brudzinski's sign and no Kernig's sign noted. No thyroid mass present.  Cardiovascular: Regular rhythm.   No murmur heard. Sinus bradycardia  Pulmonary/Chest: Effort normal and breath sounds normal. She has no wheezes.  Abdominal: Soft. Bowel sounds are normal. She exhibits no distension and no mass. There is no hepatosplenomegaly. There is tenderness (RLQ). There is positive Murphy's sign. There is no rigidity, no rebound, no guarding, no CVA tenderness and no tenderness at McBurney's point.  Musculoskeletal: Normal range of motion. She exhibits tenderness (diffuse).  Neurological: She is alert and oriented to person, place, and time. No cranial nerve deficit.  Skin: Skin is warm and dry. No rash noted.       Assessment & Plan:  Fever, chills, myaglias- dry cough/back pain/severely elevated LFTs + RUQ and epigastric pain s/p choley ? Viral versus rule out cancer/choleodocolithaisis.  Stop all tylenol, denies ETOH, get CXR, increase potassium to two a day Stop the topamax, tylenol, pravastatin.  Recheck CBC, GGT, INR, amylase, get AB [place] Go to ER if pain worsens.

## 2015-12-21 ENCOUNTER — Other Ambulatory Visit: Payer: Self-pay | Admitting: Physician Assistant

## 2015-12-21 ENCOUNTER — Ambulatory Visit
Admission: RE | Admit: 2015-12-21 | Discharge: 2015-12-21 | Disposition: A | Discharge status: Home / Self Care | Payer: Federal, State, Local not specified - PPO | Source: Ambulatory Visit | Attending: Physician Assistant | Admitting: Physician Assistant

## 2015-12-21 DIAGNOSIS — R748 Abnormal levels of other serum enzymes: Secondary | ICD-10-CM

## 2015-12-21 DIAGNOSIS — R1010 Upper abdominal pain, unspecified: Secondary | ICD-10-CM

## 2015-12-21 LAB — BASIC METABOLIC PANEL WITH GFR
BUN: 20 mg/dL (ref 7–25)
CALCIUM: 9.1 mg/dL (ref 8.6–10.4)
CO2: 27 mmol/L (ref 20–31)
Chloride: 101 mmol/L (ref 98–110)
Creat: 0.95 mg/dL (ref 0.50–0.99)
GFR, EST AFRICAN AMERICAN: 74 mL/min (ref >=60)
GFR, EST NON AFRICAN AMERICAN: 64 mL/min (ref >=60)
GLUCOSE: 87 mg/dL (ref 65–99)
Potassium: 3.1 mmol/L — ABNORMAL LOW (ref 3.5–5.3)
SODIUM: 137 mmol/L (ref 135–146)

## 2015-12-21 LAB — GAMMA GT: GGT: 145 U/L — AB (ref 7–51)

## 2015-12-21 LAB — HEPATIC FUNCTION PANEL
ALT: 151 U/L — ABNORMAL HIGH (ref 6–29)
AST: 98 U/L — ABNORMAL HIGH (ref 10–35)
Albumin: 3.6 g/dL (ref 3.6–5.1)
Alkaline Phosphatase: 275 U/L — ABNORMAL HIGH (ref 33–130)
BILIRUBIN INDIRECT: 0.6 mg/dL (ref 0.2–1.2)
BILIRUBIN TOTAL: 0.8 mg/dL (ref 0.2–1.2)
Bilirubin, Direct: 0.2 mg/dL (ref <=0.2)
TOTAL PROTEIN: 6.6 g/dL (ref 6.1–8.1)

## 2015-12-21 LAB — PROTIME-INR
INR: 0.97 (ref <1.50)
Prothrombin Time: 13 seconds (ref 11.6–15.2)

## 2015-12-21 LAB — AMYLASE: AMYLASE: 29 U/L (ref 0–105)

## 2015-12-21 MED ORDER — POTASSIUM CHLORIDE ER 20 MEQ PO TBCR: 10.0000 meq | EXTENDED_RELEASE_TABLET | Freq: Two times a day (BID) | ORAL | Status: DC

## 2015-12-21 NOTE — Addendum Note (Signed)
Addended by: Vicie Mutters R on: 12/21/2015 12:57 PM   Modules accepted: Orders

## 2015-12-29 ENCOUNTER — Other Ambulatory Visit: Payer: Federal, State, Local not specified - PPO

## 2015-12-29 DIAGNOSIS — E782 Mixed hyperlipidemia: Secondary | ICD-10-CM | POA: Diagnosis not present

## 2015-12-29 DIAGNOSIS — R748 Abnormal levels of other serum enzymes: Secondary | ICD-10-CM

## 2015-12-29 DIAGNOSIS — Z79899 Other long term (current) drug therapy: Secondary | ICD-10-CM | POA: Diagnosis not present

## 2015-12-29 DIAGNOSIS — E876 Hypokalemia: Secondary | ICD-10-CM

## 2015-12-29 LAB — HEPATIC FUNCTION PANEL
ALK PHOS: 128 U/L (ref 33–130)
ALT: 25 U/L (ref 6–29)
AST: 18 U/L (ref 10–35)
Albumin: 3.1 g/dL — ABNORMAL LOW (ref 3.6–5.1)
BILIRUBIN INDIRECT: 0.9 mg/dL (ref 0.2–1.2)
Bilirubin, Direct: 0.2 mg/dL (ref <=0.2)
TOTAL PROTEIN: 5.9 g/dL — AB (ref 6.1–8.1)
Total Bilirubin: 1.1 mg/dL (ref 0.2–1.2)

## 2015-12-29 LAB — BASIC METABOLIC PANEL WITH GFR
BUN: 13 mg/dL (ref 7–25)
CALCIUM: 8.5 mg/dL — AB (ref 8.6–10.4)
CO2: 26 mmol/L (ref 20–31)
Chloride: 105 mmol/L (ref 98–110)
Creat: 0.88 mg/dL (ref 0.50–0.99)
GFR, EST AFRICAN AMERICAN: 81 mL/min (ref >=60)
GFR, EST NON AFRICAN AMERICAN: 70 mL/min (ref >=60)
GLUCOSE: 80 mg/dL (ref 65–99)
Potassium: 4.2 mmol/L (ref 3.5–5.3)
SODIUM: 140 mmol/L (ref 135–146)

## 2015-12-29 LAB — MAGNESIUM: Magnesium: 2 mg/dL (ref 1.5–2.5)

## 2016-02-15 ENCOUNTER — Encounter: Payer: Self-pay | Admitting: Physician Assistant

## 2016-02-15 ENCOUNTER — Ambulatory Visit (INDEPENDENT_AMBULATORY_CARE_PROVIDER_SITE_OTHER): Payer: Federal, State, Local not specified - PPO | Admitting: Physician Assistant

## 2016-02-15 ENCOUNTER — Other Ambulatory Visit: Payer: Self-pay

## 2016-02-15 VITALS — BP 132/76 | HR 45 | Temp 97.3°F | Resp 16 | Ht 63.25 in | Wt 153.8 lb

## 2016-02-15 DIAGNOSIS — G2581 Restless legs syndrome: Secondary | ICD-10-CM | POA: Diagnosis not present

## 2016-02-15 DIAGNOSIS — Z79899 Other long term (current) drug therapy: Secondary | ICD-10-CM | POA: Diagnosis not present

## 2016-02-15 DIAGNOSIS — R748 Abnormal levels of other serum enzymes: Secondary | ICD-10-CM

## 2016-02-15 DIAGNOSIS — E785 Hyperlipidemia, unspecified: Secondary | ICD-10-CM

## 2016-02-15 DIAGNOSIS — R7303 Prediabetes: Secondary | ICD-10-CM

## 2016-02-15 DIAGNOSIS — E559 Vitamin D deficiency, unspecified: Secondary | ICD-10-CM

## 2016-02-15 DIAGNOSIS — I10 Essential (primary) hypertension: Secondary | ICD-10-CM

## 2016-02-15 DIAGNOSIS — R350 Frequency of micturition: Secondary | ICD-10-CM

## 2016-02-15 LAB — BASIC METABOLIC PANEL WITH GFR
BUN: 12 mg/dL (ref 7–25)
CALCIUM: 8.5 mg/dL — AB (ref 8.6–10.4)
CHLORIDE: 104 mmol/L (ref 98–110)
CO2: 26 mmol/L (ref 20–31)
CREATININE: 1 mg/dL — AB (ref 0.50–0.99)
GFR, Est African American: 69 mL/min (ref >=60)
GFR, Est Non African American: 60 mL/min (ref >=60)
Glucose, Bld: 83 mg/dL (ref 65–99)
Potassium: 3.7 mmol/L (ref 3.5–5.3)
SODIUM: 138 mmol/L (ref 135–146)

## 2016-02-15 LAB — CBC WITH DIFFERENTIAL/PLATELET
BASOS ABS: 45 {cells}/uL (ref 0–200)
Basophils Relative: 1 %
EOS PCT: 4 %
Eosinophils Absolute: 180 cells/uL (ref 15–500)
HCT: 34.6 % — ABNORMAL LOW (ref 35.0–45.0)
Hemoglobin: 11.2 g/dL — ABNORMAL LOW (ref 11.7–15.5)
Lymphocytes Relative: 53 %
Lymphs Abs: 2385 cells/uL (ref 850–3900)
MCH: 29.6 pg (ref 27.0–33.0)
MCHC: 32.4 g/dL (ref 32.0–36.0)
MCV: 91.3 fL (ref 80.0–100.0)
MONOS PCT: 9 %
MPV: 10.1 fL (ref 7.5–12.5)
Monocytes Absolute: 405 cells/uL (ref 200–950)
NEUTROS ABS: 1485 {cells}/uL — AB (ref 1500–7800)
Neutrophils Relative %: 33 %
PLATELETS: 279 10*3/uL (ref 140–400)
RBC: 3.79 MIL/uL — AB (ref 3.80–5.10)
RDW: 14.1 % (ref 11.0–15.0)
WBC: 4.5 10*3/uL (ref 3.8–10.8)

## 2016-02-15 LAB — HEPATIC FUNCTION PANEL
ALT: 13 U/L (ref 6–29)
AST: 20 U/L (ref 10–35)
Albumin: 3.5 g/dL — ABNORMAL LOW (ref 3.6–5.1)
Alkaline Phosphatase: 94 U/L (ref 33–130)
BILIRUBIN DIRECT: 0.3 mg/dL — AB (ref <=0.2)
BILIRUBIN TOTAL: 1.3 mg/dL — AB (ref 0.2–1.2)
Indirect Bilirubin: 1 mg/dL (ref 0.2–1.2)
Total Protein: 6 g/dL — ABNORMAL LOW (ref 6.1–8.1)

## 2016-02-15 LAB — LIPID PANEL
CHOL/HDL RATIO: 1.9 ratio (ref <=5.0)
CHOLESTEROL: 144 mg/dL (ref 125–200)
HDL: 76 mg/dL (ref >=46)
LDL Cholesterol: 58 mg/dL (ref <130)
Triglycerides: 52 mg/dL (ref <150)
VLDL: 10 mg/dL (ref <30)

## 2016-02-15 LAB — TSH: TSH: 0.92 m[IU]/L

## 2016-02-15 LAB — MAGNESIUM: Magnesium: 1.8 mg/dL (ref 1.5–2.5)

## 2016-02-15 LAB — HEMOGLOBIN A1C
Hgb A1c MFr Bld: 5.6 % (ref <5.7)
Mean Plasma Glucose: 114 mg/dL

## 2016-02-15 MED ORDER — ROPINIROLE HCL 0.5 MG PO TABS: ORAL_TABLET | ORAL | Status: DC

## 2016-02-15 NOTE — Progress Notes (Signed)
Assessment and Plan:  Hypertension: Continue medication, monitor blood pressure at home. Continue DASH diet.  Reminder to go to the ER if any CP, SOB, nausea, dizziness, severe HA, changes vision/speech, left arm numbness and tingling, and jaw pain. Cholesterol: Continue diet and exercise. Check cholesterol.  Pre-diabetes-Continue diet and exercise. Check A1C Vitamin D Def- check level and continue medications.  RLS- restart requip, given information RLQ pain- no rebound, check labs, follow up GI  Continue diet and meds as discussed. Further disposition pending results of labs. Future Appointments Date Time Provider [place]  05/23/2016 11:00 AM [name], MD [facility] None    HPI 65 y.o. female  presents for 3 month follow up with hypertension, hyperlipidemia, prediabetes and vitamin D, she had gastric bypass and has done a great job with weight loss getting off her meds.   Her blood pressure has been controlled at home, she is back on half of lisinopril 10mg  from Dr. Percival Spanish, today their BP is BP: 132/76 mmHg  She does not workout. She denies chest pain, shortness of breath, dizziness.  She is on cholesterol medication, pravastatin 40mg  1/2 pill every other day but has been off due to elevated liver function. and denies myalgias. Her cholesterol is at goal. The cholesterol last visit was:   Lab Results  Component Value Date   CHOL 118* 11/16/2015   HDL 61 11/16/2015   LDLCALC 45 11/16/2015   TRIG 61 11/16/2015   CHOLHDL 1.9 11/16/2015    She has been working on diet and exercise for prediabetes, and denies paresthesia of the feet, polydipsia, polyuria and visual disturbances. Last A1C in the office was:  Lab Results  Component Value Date   HGBA1C 5.9* 11/16/2015   Patient is on Vitamin D supplement, on 50,000 QOD.   Lab Results  Component Value Date   VD25OH 92 11/16/2015     She is on thyroid medication. Her medication was not changed last visit.   Lab Results   Component Value Date   TSH 0.806 11/16/2015  .  BMI is Body mass index is 27.01 kg/(m^2)., she is working on diet and exercise. Wt Readings from Last 3 Encounters:  02/15/16 153 lb 12.8 oz (69.763 kg)  12/20/15 149 lb (67.586 kg)  12/19/15 146 lb (66.225 kg)   She still complains of bilateral leg pain, worse when she lies down at night, better when she moves them, will have some foot burning as well. She had an elevated liver function and was taken off her topamax, pravasatin and norco last visit. Her LFts trended down and we will recheck them today, normal AB [place], she has been struggling with her legs at night and wants to restart things.  Lab Results  Component Value Date   IRON 108 05/16/2015   TIBC 320 05/16/2015   FERRITIN 42 05/16/2015   Lab Results  Component Value Date   ALT 25 12/29/2015   AST 18 12/29/2015   ALKPHOS 128 12/29/2015   BILITOT 1.1 12/29/2015    Lab Results  Component Value Date   [id number] 05/16/2015    Current Medications:  Current Outpatient Prescriptions on File Prior to Visit  Medication Sig Dispense Refill  . aspirin 81 MG tablet Take 81 mg by mouth daily.    . Biotin 10 MG TABS Take 10,000 each by mouth daily.    . Calcium Citrate-Vitamin D (CALCIUM CITRATE + D3 PO) Take 600 mg by mouth daily.     . Ferrous Sulfate (SLOW  RELEASE IRON PO) Take by mouth daily.    . fish oil-omega-3 fatty acids 1000 MG capsule Take 1 g by mouth daily.    . fluticasone (FLONASE) 50 MCG/ACT nasal spray Place 2 sprays into both nostrils daily. 16 g 0  . HYDROcodone-acetaminophen (NORCO) 5-325 MG tablet Take 1 tablet by mouth every 6 (six) hours as needed for moderate pain (Cough). Max: 4 tablets a day 30 tablet 0  . Magnesium 250 MG TABS Take by mouth daily.    . potassium chloride 20 MEQ TBCR Take 10 mEq by mouth 2 (two) times daily. 60 tablet 1  . potassium gluconate 595 MG TABS Take 595 mg by mouth daily.     . pravastatin (PRAVACHOL) 40 MG tablet TAKE ONE  TABLET BY MOUTH ONCE DAILY 90 tablet 2  . ranitidine (ZANTAC) 300 MG tablet Take 1 to 2 tablets daily for heartburn & reflux 180 tablet 1  . rOPINIRole (REQUIP) 0.5 MG tablet     . topiramate (TOPAMAX) 25 MG tablet TAKE ONE TABLET BY MOUTH AT BEDTIME 30 tablet 0  . vitamin B-12 (CYANOCOBALAMIN) 1000 MCG tablet Take 1,000 mcg by mouth daily.     . Vitamin D, Ergocalciferol, (DRISDOL) 50000 units CAPS capsule TAKE ONE CAPSULE BY MOUTH ONCE DAILY 90 capsule 1   No current facility-administered medications on file prior to visit.   Medical History:  Past Medical History  Diagnosis Date  . Hypertension     no treatment after 120 lb weight loss  . Hyperlipidemia   . Hypothyroidism   . Thyroid nodule   . Allergic rhinitis   . Arthritis   . Cholecystitis, chronic   . Dysrhythmia     Hx bradycardia - hx normal cardiac cath x2 / ECHO 2009 EF 60% LAE MYOVIEW 3/12 EF 60% NO ISCHEMIA  . Prediabetes    Allergies:  Allergies  Allergen Reactions  . Codeine Other (See Comments)    "puts me out of it".  Dysphoria.  Patient tolerates Tramadol without complications.  . Nabumetone     GI upset     Review of Systems:  Review of Systems  Constitutional: Negative.   HENT: Negative.   Eyes: Negative.   Respiratory: Negative.   Cardiovascular: Negative.   Gastrointestinal: Positive for abdominal pain (right lower quadrant, will last seconds to 3 mins, has happened 4 times, no accompaniments, nothing better or worse) and diarrhea. Negative for heartburn, nausea, vomiting, constipation, blood in stool and melena.  Genitourinary: Negative.   Musculoskeletal: Positive for myalgias (worse at night/rest, better with movement), back pain and neck pain. Negative for joint pain and falls.  Skin: Negative.   Neurological: Negative.   Endo/Heme/Allergies: Negative.   Psychiatric/Behavioral: Negative.     Family history- Review and unchanged Social history- Review and unchanged Physical Exam: BP 132/76  mmHg  Pulse 45  Temp(Src) 97.3 F (36.3 C) (Temporal)  Resp 16  Ht 5' 3.25" (1.607 m)  Wt 153 lb 12.8 oz (69.763 kg)  BMI 27.01 kg/m2  SpO2 99%  LMP  (LMP Unknown) Wt Readings from Last 3 Encounters:  02/15/16 153 lb 12.8 oz (69.763 kg)  12/20/15 149 lb (67.586 kg)  12/19/15 146 lb (66.225 kg)   General Appearance: Well nourished, in no apparent distress. Eyes: PERRLA, EOMs, conjunctiva no swelling or erythema Sinuses: No Frontal/maxillary tenderness ENT/Mouth: Ext aud canals clear, TMs without erythema, bulging. No erythema, swelling, or exudate on post pharynx.  Tonsils not swollen or erythematous. Hearing normal.  Neck:  Supple, thyroid normal.  Respiratory: Respiratory effort normal, BS equal bilaterally without rales, rhonchi, wheezing or stridor.  Cardio: RRR with no MRGs. Brisk peripheral pulses without edema.  Abdomen: Soft, + BS,  Mild RLQ tender, no guarding, rebound, hernias, masses. Lymphatics: Non tender without lymphadenopathy.  Musculoskeletal: Full ROM, 5/5 strength, Normal gait.  Skin: Warm, dry without rashes, lesions, ecchymosis.  Neuro: Cranial nerves intact. Normal muscle tone, no cerebellar symptoms. Psych: Awake and oriented X 3, normal affect, Insight and Judgment appropriate.    Vicie Mutters, PA-C 10:18 AM [facility]

## 2016-02-16 LAB — URINALYSIS, ROUTINE W REFLEX MICROSCOPIC
Bilirubin Urine: NEGATIVE
Glucose, UA: NEGATIVE
Hgb urine dipstick: NEGATIVE
Ketones, ur: NEGATIVE
Leukocytes, UA: NEGATIVE
NITRITE: NEGATIVE
PH: 5.5 (ref 5.0–8.0)
Protein, ur: NEGATIVE
SPECIFIC GRAVITY, URINE: 1.018 (ref 1.001–1.035)

## 2016-02-16 LAB — URINE CULTURE: Colony Count: 1000

## 2016-02-16 LAB — VITAMIN D 25 HYDROXY (VIT D DEFICIENCY, FRACTURES): Vit D, 25-Hydroxy: 78 ng/mL (ref 30–100)

## 2016-02-17 ENCOUNTER — Other Ambulatory Visit: Payer: Self-pay | Admitting: Physician Assistant

## 2016-02-17 DIAGNOSIS — D649 Anemia, unspecified: Secondary | ICD-10-CM

## 2016-02-27 ENCOUNTER — Other Ambulatory Visit: Payer: Self-pay | Admitting: Internal Medicine

## 2016-02-27 IMAGING — CR DG CHEST 2V
2 series · 2 of 2 positions shown · non-contrast
Comparison: 12/29/2012

CLINICAL DATA: Nonproductive cough and chest congestion for several
days. Fever. Chills.

EXAM:
CHEST  2 VIEW

[chest pa]
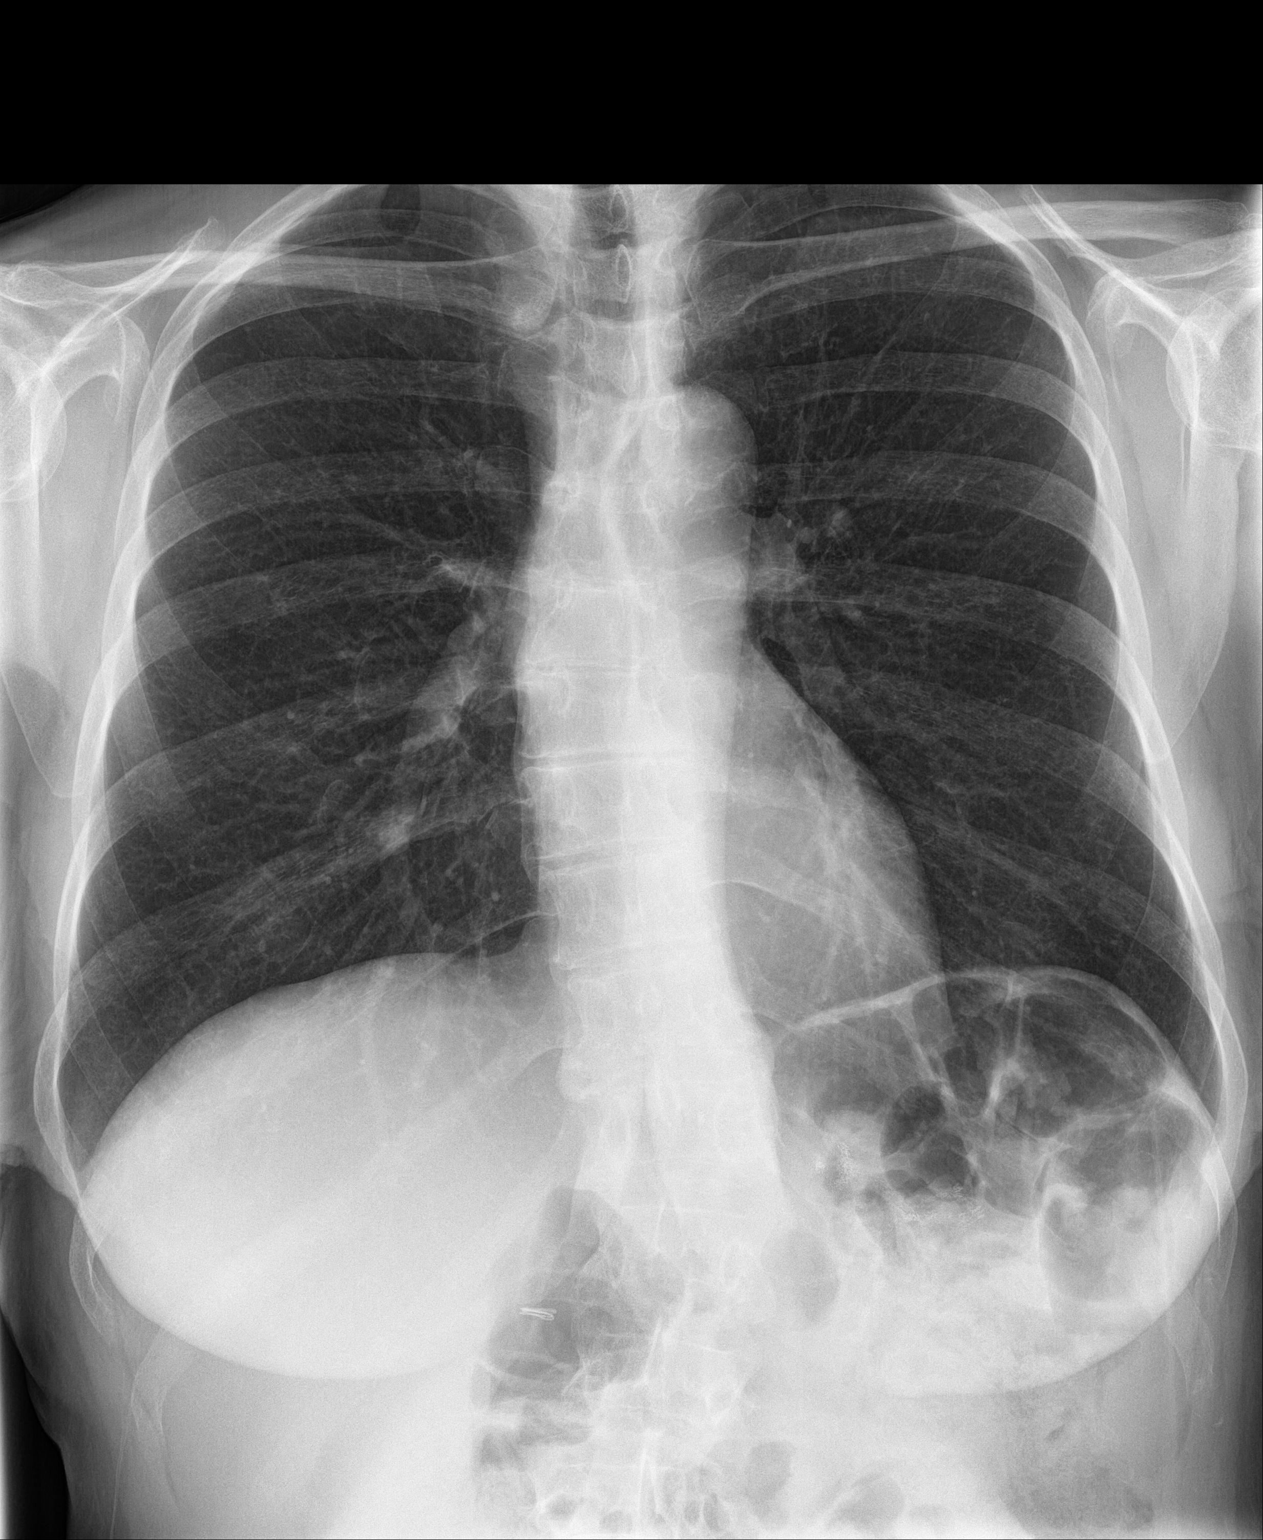

[chest lat]
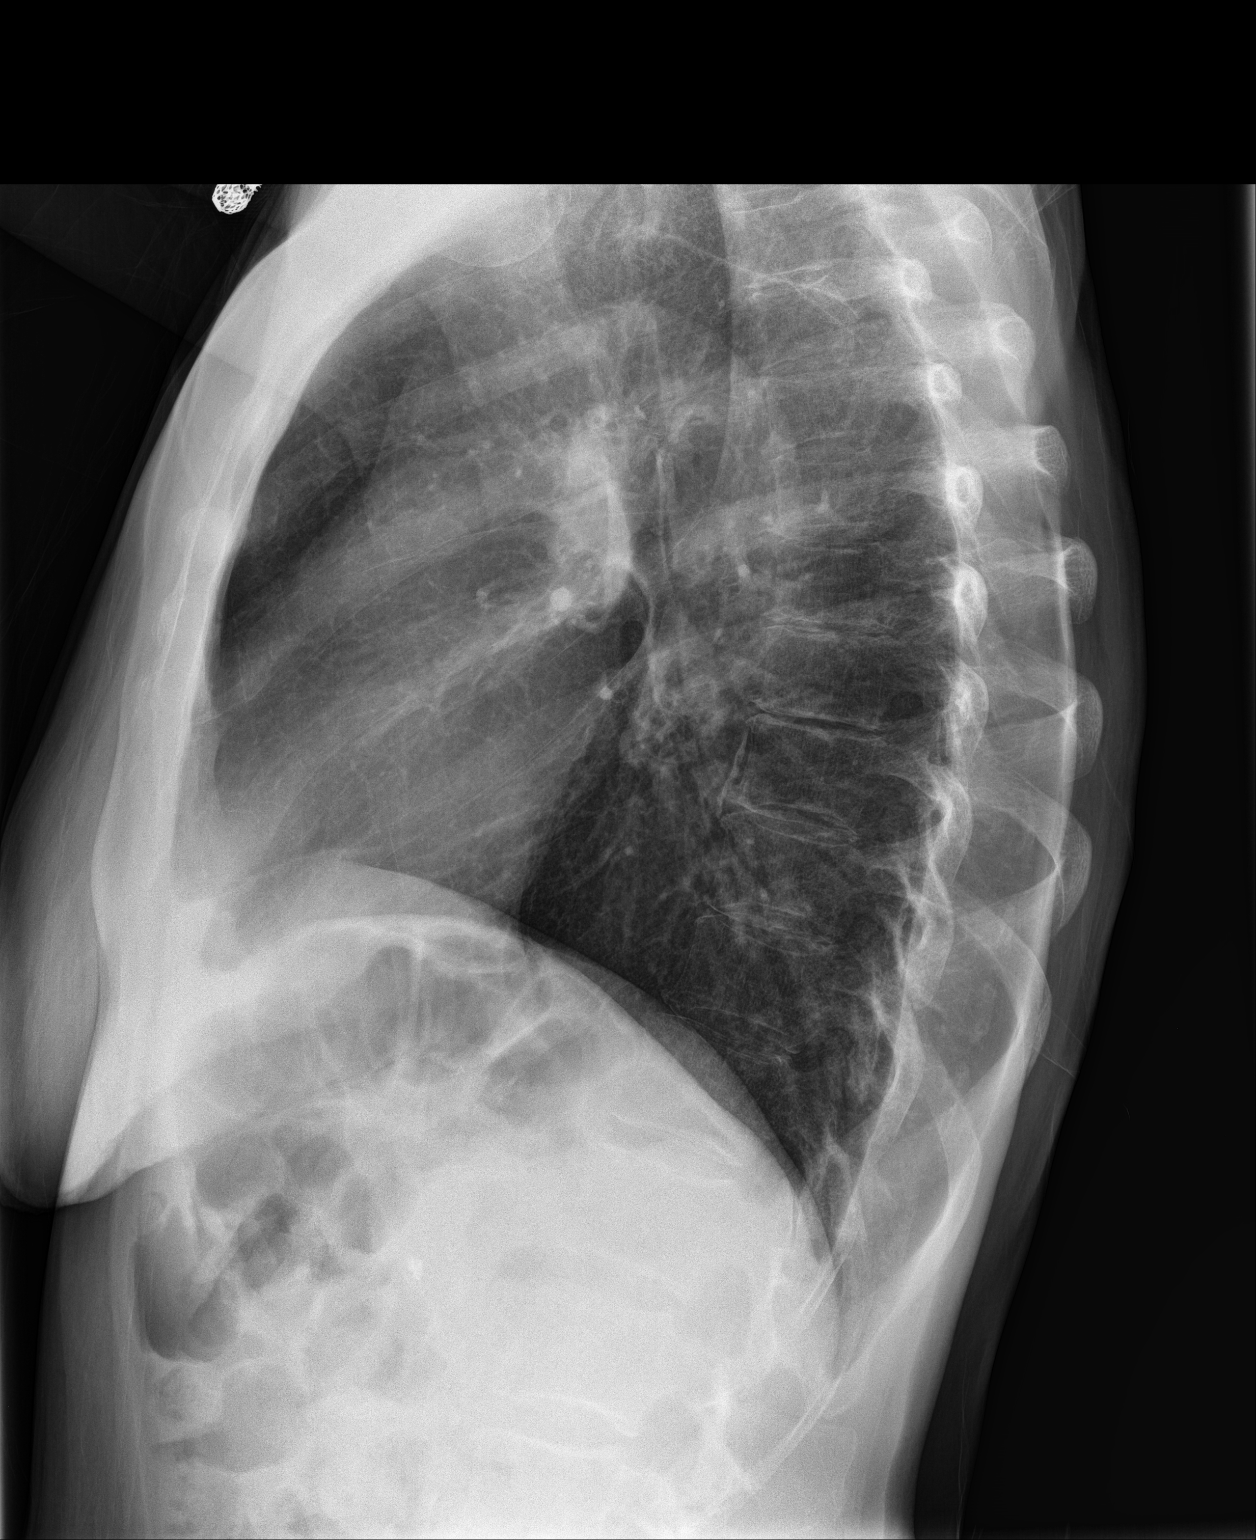

[2 of 2 positions shown; findings below may reference images not displayed]

FINDINGS: The heart size and mediastinal contours are within normal limits.
Both lungs are clear. The visualized skeletal structures are
unremarkable.
IMPRESSION: No active cardiopulmonary disease.

## 2016-03-07 ENCOUNTER — Other Ambulatory Visit: Payer: Federal, State, Local not specified - PPO

## 2016-03-07 DIAGNOSIS — R42 Dizziness and giddiness: Secondary | ICD-10-CM | POA: Diagnosis not present

## 2016-03-07 DIAGNOSIS — D649 Anemia, unspecified: Secondary | ICD-10-CM

## 2016-03-07 LAB — CBC WITH DIFFERENTIAL/PLATELET
BASOS PCT: 1 %
Basophils Absolute: 44 cells/uL (ref 0–200)
EOS ABS: 352 {cells}/uL (ref 15–500)
Eosinophils Relative: 8 %
HEMATOCRIT: 35.7 % (ref 35.0–45.0)
Hemoglobin: 11.7 g/dL (ref 11.7–15.5)
LYMPHS PCT: 47 %
Lymphs Abs: 2068 cells/uL (ref 850–3900)
MCH: 29.8 pg (ref 27.0–33.0)
MCHC: 32.8 g/dL (ref 32.0–36.0)
MCV: 90.8 fL (ref 80.0–100.0)
MONO ABS: 352 {cells}/uL (ref 200–950)
MONOS PCT: 8 %
MPV: 10 fL (ref 7.5–12.5)
NEUTROS PCT: 36 %
Neutro Abs: 1584 cells/uL (ref 1500–7800)
Platelets: 296 10*3/uL (ref 140–400)
RBC: 3.93 MIL/uL (ref 3.80–5.10)
RDW: 14.2 % (ref 11.0–15.0)
WBC: 4.4 10*3/uL (ref 3.8–10.8)

## 2016-03-07 LAB — FERRITIN: Ferritin: 14 ng/mL — ABNORMAL LOW (ref 20–288)

## 2016-03-07 LAB — VITAMIN B12: VITAMIN B 12: 1504 pg/mL — AB (ref 200–1100)

## 2016-03-07 LAB — IRON AND TIBC
%SAT: 28 % (ref 11–50)
Iron: 101 ug/dL (ref 45–160)
TIBC: 360 ug/dL (ref 250–450)
UIBC: 259 ug/dL (ref 125–400)

## 2016-03-08 LAB — FOLATE RBC: RBC FOLATE: 485 ng/mL (ref >280)

## 2016-04-04 ENCOUNTER — Other Ambulatory Visit: Payer: Self-pay | Admitting: *Deleted

## 2016-04-04 MED ORDER — ACYCLOVIR 400 MG PO TABS: 400.0000 mg | ORAL_TABLET | Freq: Every day | ORAL | Status: DC

## 2016-04-05 ENCOUNTER — Other Ambulatory Visit: Payer: Self-pay | Admitting: Internal Medicine

## 2016-04-05 MED ORDER — ACYCLOVIR 5 % EX OINT: TOPICAL_OINTMENT | CUTANEOUS | Status: AC

## 2016-04-16 ENCOUNTER — Ambulatory Visit: Payer: Federal, State, Local not specified - PPO | Admitting: Podiatry

## 2016-05-16 ENCOUNTER — Ambulatory Visit: Payer: Federal, State, Local not specified - PPO | Admitting: Podiatry

## 2016-05-23 ENCOUNTER — Encounter: Payer: Self-pay | Admitting: Internal Medicine

## 2016-05-23 ENCOUNTER — Ambulatory Visit (INDEPENDENT_AMBULATORY_CARE_PROVIDER_SITE_OTHER): Payer: Federal, State, Local not specified - PPO | Admitting: Internal Medicine

## 2016-05-23 VITALS — BP 128/74 | HR 48 | Temp 97.5°F | Resp 16 | Ht 63.0 in | Wt 159.2 lb

## 2016-05-23 DIAGNOSIS — R519 Headache, unspecified: Secondary | ICD-10-CM

## 2016-05-23 DIAGNOSIS — Z0001 Encounter for general adult medical examination with abnormal findings: Secondary | ICD-10-CM

## 2016-05-23 DIAGNOSIS — R6889 Other general symptoms and signs: Secondary | ICD-10-CM | POA: Diagnosis not present

## 2016-05-23 DIAGNOSIS — E039 Hypothyroidism, unspecified: Secondary | ICD-10-CM

## 2016-05-23 DIAGNOSIS — Z136 Encounter for screening for cardiovascular disorders: Secondary | ICD-10-CM | POA: Diagnosis not present

## 2016-05-23 DIAGNOSIS — Z111 Encounter for screening for respiratory tuberculosis: Secondary | ICD-10-CM

## 2016-05-23 DIAGNOSIS — E1121 Type 2 diabetes mellitus with diabetic nephropathy: Secondary | ICD-10-CM | POA: Diagnosis not present

## 2016-05-23 DIAGNOSIS — Z79899 Other long term (current) drug therapy: Secondary | ICD-10-CM | POA: Diagnosis not present

## 2016-05-23 DIAGNOSIS — E785 Hyperlipidemia, unspecified: Secondary | ICD-10-CM

## 2016-05-23 DIAGNOSIS — Z23 Encounter for immunization: Secondary | ICD-10-CM | POA: Diagnosis not present

## 2016-05-23 DIAGNOSIS — I10 Essential (primary) hypertension: Secondary | ICD-10-CM | POA: Diagnosis not present

## 2016-05-23 DIAGNOSIS — Z1212 Encounter for screening for malignant neoplasm of rectum: Secondary | ICD-10-CM

## 2016-05-23 DIAGNOSIS — R5383 Other fatigue: Secondary | ICD-10-CM

## 2016-05-23 DIAGNOSIS — R7303 Prediabetes: Secondary | ICD-10-CM

## 2016-05-23 DIAGNOSIS — E559 Vitamin D deficiency, unspecified: Secondary | ICD-10-CM

## 2016-05-23 DIAGNOSIS — R51 Headache: Secondary | ICD-10-CM

## 2016-05-23 LAB — CBC WITH DIFFERENTIAL/PLATELET
BASOS ABS: 94 {cells}/uL (ref 0–200)
Basophils Relative: 2 %
EOS ABS: 282 {cells}/uL (ref 15–500)
Eosinophils Relative: 6 %
HEMATOCRIT: 35.1 % (ref 35.0–45.0)
Hemoglobin: 11.5 g/dL — ABNORMAL LOW (ref 11.7–15.5)
LYMPHS PCT: 53 %
Lymphs Abs: 2491 cells/uL (ref 850–3900)
MCH: 29.1 pg (ref 27.0–33.0)
MCHC: 32.8 g/dL (ref 32.0–36.0)
MCV: 88.9 fL (ref 80.0–100.0)
MONO ABS: 376 {cells}/uL (ref 200–950)
MPV: 10.2 fL (ref 7.5–12.5)
Monocytes Relative: 8 %
NEUTROS PCT: 31 %
Neutro Abs: 1457 cells/uL — ABNORMAL LOW (ref 1500–7800)
Platelets: 293 10*3/uL (ref 140–400)
RBC: 3.95 MIL/uL (ref 3.80–5.10)
RDW: 14.5 % (ref 11.0–15.0)
WBC: 4.7 10*3/uL (ref 3.8–10.8)

## 2016-05-23 LAB — IRON AND TIBC
%SAT: 23 % (ref 11–50)
IRON: 79 ug/dL (ref 45–160)
TIBC: 345 ug/dL (ref 250–450)
UIBC: 266 ug/dL (ref 125–400)

## 2016-05-23 LAB — LIPID PANEL
CHOLESTEROL: 138 mg/dL (ref 125–200)
HDL: 79 mg/dL (ref >=46)
LDL Cholesterol: 50 mg/dL (ref <130)
TRIGLYCERIDES: 43 mg/dL (ref <150)
Total CHOL/HDL Ratio: 1.7 Ratio (ref <=5.0)
VLDL: 9 mg/dL (ref <30)

## 2016-05-23 LAB — BASIC METABOLIC PANEL WITH GFR
BUN: 12 mg/dL (ref 7–25)
CALCIUM: 8.8 mg/dL (ref 8.6–10.4)
CO2: 25 mmol/L (ref 20–31)
CREATININE: 0.84 mg/dL (ref 0.50–0.99)
Chloride: 108 mmol/L (ref 98–110)
GFR, EST AFRICAN AMERICAN: 85 mL/min (ref >=60)
GFR, Est Non African American: 74 mL/min (ref >=60)
GLUCOSE: 77 mg/dL (ref 65–99)
Potassium: 3.9 mmol/L (ref 3.5–5.3)
Sodium: 140 mmol/L (ref 135–146)

## 2016-05-23 LAB — MAGNESIUM: Magnesium: 1.9 mg/dL (ref 1.5–2.5)

## 2016-05-23 LAB — TSH: TSH: 1.06 mIU/L

## 2016-05-23 LAB — HEPATIC FUNCTION PANEL
ALBUMIN: 3.8 g/dL (ref 3.6–5.1)
ALT: 16 U/L (ref 6–29)
AST: 23 U/L (ref 10–35)
Alkaline Phosphatase: 103 U/L (ref 33–130)
Bilirubin, Direct: 0.3 mg/dL — ABNORMAL HIGH (ref <=0.2)
Indirect Bilirubin: 1 mg/dL (ref 0.2–1.2)
TOTAL PROTEIN: 6.3 g/dL (ref 6.1–8.1)
Total Bilirubin: 1.3 mg/dL — ABNORMAL HIGH (ref 0.2–1.2)

## 2016-05-23 LAB — HEMOGLOBIN A1C
HEMOGLOBIN A1C: 5.5 % (ref <5.7)
MEAN PLASMA GLUCOSE: 111 mg/dL

## 2016-05-23 LAB — VITAMIN B12: Vitamin B-12: 1263 pg/mL — ABNORMAL HIGH (ref 200–1100)

## 2016-05-23 NOTE — Patient Instructions (Signed)
Preventive Care for Adults  A healthy lifestyle and preventive care can promote health and wellness. Preventive health guidelines for women include the following key practices.  A routine yearly physical is a good way to check with your health care provider about your health and preventive screening. It is a chance to share any concerns and updates on your health and to receive a thorough exam.  Visit your dentist for a routine exam and preventive care every 6 months. Brush your teeth twice a day and floss once a day. Good oral hygiene prevents tooth decay and gum disease.  The frequency of eye exams is based on your age, health, family medical history, use of contact lenses, and other factors. Follow your health care provider's recommendations for frequency of eye exams.  Eat a healthy diet. Foods like vegetables, fruits, whole grains, low-fat dairy products, and lean protein foods contain the nutrients you need without too many calories. Decrease your intake of foods high in solid fats, added sugars, and salt. Eat the right amount of calories for you.Get information about a proper diet from your health care provider, if necessary.  Regular physical exercise is one of the most important things you can do for your health. Most adults should get at least 150 minutes of moderate-intensity exercise (any activity that increases your heart rate and causes you to sweat) each week. In addition, most adults need muscle-strengthening exercises on 2 or more days a week.  Maintain a healthy weight. The body mass index (BMI) is a screening tool to identify possible weight problems. It provides an estimate of body fat based on height and weight. Your health care provider can find your BMI and can help you achieve or maintain a healthy weight.For adults 20 years and older:  A BMI below 18.5 is considered underweight.  A BMI of 18.5 to 24.9 is normal.  A BMI of 25 to 29.9 is considered overweight.  A BMI  of 30 and above is considered obese.  Maintain normal blood lipids and cholesterol levels by exercising and minimizing your intake of saturated fat. Eat a balanced diet with plenty of fruit and vegetables. Blood tests for lipids and cholesterol should begin at age 58 and be repeated every 5 years. If your lipid or cholesterol levels are high, you are over 50, or you are at high risk for heart disease, you may need your cholesterol levels checked more frequently.Ongoing high lipid and cholesterol levels should be treated with medicines if diet and exercise are not working.  If you smoke, find out from your health care provider how to quit. If you do not use tobacco, do not start.  Lung cancer screening is recommended for adults aged 58-80 years who are at high risk for developing lung cancer because of a history of smoking. A yearly low-dose CT scan of the lungs is recommended for people who have at least a 30-pack-year history of smoking and are a current smoker or have quit within the past 15 years. A pack year of smoking is smoking an average of 1 pack of cigarettes a day for 1 year (for example: 1 pack a day for 30 years or 2 packs a day for 15 years). Yearly screening should continue until the smoker has stopped smoking for at least 15 years. Yearly screening should be stopped for people who develop a health problem that would prevent them from having lung cancer treatment.  High blood pressure causes heart disease and increases the risk of  stroke. Your blood pressure should be checked at least every 1 to 2 years. Ongoing high blood pressure should be treated with medicines if weight loss and exercise do not work.  If you are 55-79 years old, ask your health care provider if you should take aspirin to prevent strokes.  Diabetes screening involves taking a blood sample to check your fasting blood sugar level. This should be done once every 3 years, after age 45, if you are within normal weight and  without risk factors for diabetes. Testing should be considered at a younger age or be carried out more frequently if you are overweight and have at least 1 risk factor for diabetes.  Breast cancer screening is essential preventive care for women. You should practice "breast self-awareness." This means understanding the normal appearance and feel of your breasts and may include breast self-examination. Any changes detected, no matter how small, should be reported to a health care provider. Women in their 20s and 30s should have a clinical breast exam (CBE) by a health care provider as part of a regular health exam every 1 to 3 years. After age 40, women should have a CBE every year. Starting at age 40, women should consider having a mammogram (breast X-ray test) every year. Women who have a family history of breast cancer should talk to their health care provider about genetic screening. Women at a high risk of breast cancer should talk to their health care providers about having an MRI and a mammogram every year.  Breast cancer gene (BRCA)-related cancer risk assessment is recommended for women who have family members with BRCA-related cancers. BRCA-related cancers include breast, ovarian, tubal, and peritoneal cancers. Having family members with these cancers may be associated with an increased risk for harmful changes (mutations) in the breast cancer genes BRCA1 and BRCA2. Results of the assessment will determine the need for genetic counseling and BRCA1 and BRCA2 testing.  Routine pelvic exams to screen for cancer are no longer recommended for nonpregnant women who are considered low risk for cancer of the pelvic organs (ovaries, uterus, and vagina) and who do not have symptoms. Ask your health care provider if a screening pelvic exam is right for you.  If you have had past treatment for cervical cancer or a condition that could lead to cancer, you need Pap tests and screening for cancer for at least 20  years after your treatment. If Pap tests have been discontinued, your risk factors (such as having a new sexual partner) need to be reassessed to determine if screening should be resumed. Some women have medical problems that increase the chance of getting cervical cancer. In these cases, your health care provider may recommend more frequent screening and Pap tests.  Colorectal cancer can be detected and often prevented. Most routine colorectal cancer screening begins at the age of 50 years and continues through age 75 years. However, your health care provider may recommend screening at an earlier age if you have risk factors for colon cancer. On a yearly basis, your health care provider may provide home test kits to check for hidden blood in the stool. Use of a small camera at the end of a tube, to directly examine the colon (sigmoidoscopy or colonoscopy), can detect the earliest forms of colorectal cancer. Talk to your health care provider about this at age 50, when routine screening begins. Direct exam of the colon should be repeated every 5-10 years through age 75 years, unless early forms of pre-cancerous   polyps or small growths are found.  Hepatitis C blood testing is recommended for all people born from 1945 through 1965 and any individual with known risks for hepatitis C.  Pra  Osteoporosis is a disease in which the bones lose minerals and strength with aging. This can result in serious bone fractures or breaks. The risk of osteoporosis can be identified using a bone density scan. Women ages 65 years and over and women at risk for fractures or osteoporosis should discuss screening with their health care providers. Ask your health care provider whether you should take a calcium supplement or vitamin D to reduce the rate of osteoporosis.  Menopause can be associated with physical symptoms and risks. Hormone replacement therapy is available to decrease symptoms and risks. You should talk to your  health care provider about whether hormone replacement therapy is right for you.  Use sunscreen. Apply sunscreen liberally and repeatedly throughout the day. You should seek shade when your shadow is shorter than you. Protect yourself by wearing long sleeves, pants, a wide-brimmed hat, and sunglasses year round, whenever you are outdoors.  Once a month, do a whole body skin exam, using a mirror to look at the skin on your back. Tell your health care provider of new moles, moles that have irregular borders, moles that are larger than a pencil eraser, or moles that have changed in shape or color.  Stay current with required vaccines (immunizations).  Influenza vaccine. All adults should be immunized every year.  Tetanus, diphtheria, and acellular pertussis (Td, Tdap) vaccine. Pregnant women should receive 1 dose of Tdap vaccine during each pregnancy. The dose should be obtained regardless of the length of time since the last dose. Immunization is preferred during the 27th-36th week of gestation. An adult who has not previously received Tdap or who does not know her vaccine status should receive 1 dose of Tdap. This initial dose should be followed by tetanus and diphtheria toxoids (Td) booster doses every 10 years. Adults with an unknown or incomplete history of completing a 3-dose immunization series with Td-containing vaccines should begin or complete a primary immunization series including a Tdap dose. Adults should receive a Td booster every 10 years.  Varicella vaccine. An adult without evidence of immunity to varicella should receive 2 doses or a second dose if she has previously received 1 dose. Pregnant females who do not have evidence of immunity should receive the first dose after pregnancy. This first dose should be obtained before leaving the health care facility. The second dose should be obtained 4-8 weeks after the first dose.  Human papillomavirus (HPV) vaccine. Females aged 13-26 years  who have not received the vaccine previously should obtain the 3-dose series. The vaccine is not recommended for use in pregnant females. However, pregnancy testing is not needed before receiving a dose. If a female is found to be pregnant after receiving a dose, no treatment is needed. In that case, the remaining doses should be delayed until after the pregnancy. Immunization is recommended for any person with an immunocompromised condition through the age of 26 years if she did not get any or all doses earlier. During the 3-dose series, the second dose should be obtained 4-8 weeks after the first dose. The third dose should be obtained 24 weeks after the first dose and 16 weeks after the second dose.  Zoster vaccine. One dose is recommended for adults aged 60 years or older unless certain conditions are present.  Measles, mumps, and rubella (  MMR) vaccine. Adults born before [birth year] generally are considered immune to measles and mumps. Adults born in [birth year] or later should have 1 or more doses of MMR vaccine unless there is a contraindication to the vaccine or there is laboratory evidence of immunity to each of the three diseases. A routine second dose of MMR vaccine should be obtained at least 28 days after the first dose for students attending postsecondary schools, health care workers, or international travelers. People who received inactivated measles vaccine or an unknown type of measles vaccine during 1963-1967 should receive 2 doses of MMR vaccine. People who received inactivated mumps vaccine or an unknown type of mumps vaccine before 1979 and are at high risk for mumps infection should consider immunization with 2 doses of MMR vaccine. For females of childbearing age, rubella immunity should be determined. If there is no evidence of immunity, females who are not pregnant should be vaccinated. If there is no evidence of immunity, females who are pregnant should delay immunization until after pregnancy.  Unvaccinated health care workers born before [birth year] who lack laboratory evidence of measles, mumps, or rubella immunity or laboratory confirmation of disease should consider measles and mumps immunization with 2 doses of MMR vaccine or rubella immunization with 1 dose of MMR vaccine.  Pneumococcal 13-valent conjugate (PCV13) vaccine. When indicated, a person who is uncertain of her immunization history and has no record of immunization should receive the PCV13 vaccine. An adult aged 39 years or older who has certain medical conditions and has not been previously immunized should receive 1 dose of PCV13 vaccine. This PCV13 should be followed with a dose of pneumococcal polysaccharide (PPSV23) vaccine. The PPSV23 vaccine dose should be obtained at least 8 weeks after the dose of PCV13 vaccine. An adult aged 62 years or older who has certain medical conditions and previously received 1 or more doses of PPSV23 vaccine should receive 1 dose of PCV13. The PCV13 vaccine dose should be obtained 1 or more years after the last PPSV23 vaccine dose.    Pneumococcal polysaccharide (PPSV23) vaccine. When PCV13 is also indicated, PCV13 should be obtained first. All adults aged 67 years and older should be immunized. An adult younger than age 45 years who has certain medical conditions should be immunized. Any person who resides in a nursing home or long-term care facility should be immunized. An adult smoker should be immunized. People with an immunocompromised condition and certain other conditions should receive both PCV13 and PPSV23 vaccines. People with human immunodeficiency virus (HIV) infection should be immunized as soon as possible after diagnosis. Immunization during chemotherapy or radiation therapy should be avoided. Routine use of PPSV23 vaccine is not recommended for American Indians, [place] Natives, or people younger than 65 years unless there are medical conditions that require PPSV23 vaccine. When indicated,  people who have unknown immunization and have no record of immunization should receive PPSV23 vaccine. One-time revaccination 5 years after the first dose of PPSV23 is recommended for people aged 19-64 years who have chronic kidney failure, nephrotic syndrome, asplenia, or immunocompromised conditions. People who received 1-2 doses of PPSV23 before age 23 years should receive another dose of PPSV23 vaccine at age 35 years or later if at least 5 years have passed since the previous dose. Doses of PPSV23 are not needed for people immunized with PPSV23 at or after age 38 years.  Preventive Services / Frequency   Ages 43 to 86 years  Blood pressure check.  Lipid and cholesterol check.  Lung  cancer screening. / Every year if you are aged 30-80 years and have a 30-pack-year history of smoking and currently smoke or have quit within the past 15 years. Yearly screening is stopped once you have quit smoking for at least 15 years or develop a health problem that would prevent you from having lung cancer treatment.  Clinical breast exam.** / Every year after age 56 years.  BRCA-related cancer risk assessment.** / For women who have family members with a BRCA-related cancer (breast, ovarian, tubal, or peritoneal cancers).  Mammogram.** / Every year beginning at age 59 years and continuing for as long as you are in good health. Consult with your health care provider.  Pap test.** / Every 3 years starting at age 48 years through age 84 or 33 years with a history of 3 consecutive normal Pap tests.  HPV screening.** / Every 3 years from ages 6 years through ages 46 to 70 years with a history of 3 consecutive normal Pap tests.  Fecal occult blood test (FOBT) of stool. / Every year beginning at age 41 years and continuing until age 72 years. You may not need to do this test if you get a colonoscopy every 10 years.  Flexible sigmoidoscopy or colonoscopy.** / Every 5 years for a flexible sigmoidoscopy or  every 10 years for a colonoscopy beginning at age 74 years and continuing until age 3 years.  Hepatitis C blood test.** / For all people born from [birth year] through 1965 and any individual with known risks for hepatitis C.  Skin self-exam. / Monthly.  Influenza vaccine. / Every year.  Tetanus, diphtheria, and acellular pertussis (Tdap/Td) vaccine.** / Consult your health care provider. Pregnant women should receive 1 dose of Tdap vaccine during each pregnancy. 1 dose of Td every 10 years.  Varicella vaccine.** / Consult your health care provider. Pregnant females who do not have evidence of immunity should receive the first dose after pregnancy.  Zoster vaccine.** / 1 dose for adults aged 95 years or older.  Pneumococcal 13-valent conjugate (PCV13) vaccine.** / Consult your health care provider.  Pneumococcal polysaccharide (PPSV23) vaccine.** / 1 to 2 doses if you smoke cigarettes or if you have certain conditions.  Meningococcal vaccine.** / Consult your health care provider.  Hepatitis A vaccine.** / Consult your health care provider.  Hepatitis B vaccine.** / Consult your health care provider. Screening for abdominal aortic aneurysm (AAA)  by ultrasound is recommended for people over 50 who have history of high blood pressure or who are current or former smokers. ++++++++++++++++++++++++++++++++++++ Recommend Adult Low Dose Aspirin or  coated  Aspirin 81 mg daily  To reduce risk of Colon Cancer 20 %,  Skin Cancer 26 % ,  Melanoma 46%  and  Pancreatic cancer 60% ++++++++++++++++++++++++++++++++++++++++++++++++++++++ Vitamin D goal  is between 70-100.  Please make sure that you are taking your Vitamin D as directed.  It is very important as a natural anti-inflammatory  helping hair, skin, and nails, as well as reducing stroke and heart attack risk.  It helps your bones and helps with mood. It also decreases numerous cancer risks so please take it as directed.  Low Vit D is  associated with a 200-300% higher risk for CANCER  and 200-300% higher risk for HEART   ATTACK  &  STROKE.   .....................................Marland Kitchen It is also associated with higher death rate at younger ages,  autoimmune diseases like Rheumatoid arthritis, Lupus, Multiple Sclerosis.    Also many other serious conditions, like depression,  Alzheimer's Dementia, infertility, muscle aches, fatigue, fibromyalgia - just to name a few. ++++++++++++++++++ Recommend the book "The END of DIETING" by Dr Joel Fuhrman  & the book "The END of DIABETES " by Dr Joel Fuhrman At [employer] - get book & Audio CD's    Being diabetic has a  300% increased risk for heart attack, stroke, cancer, and alzheimer- type vascular dementia. It is very important that you work harder with diet by avoiding all foods that are white. Avoid white rice (brown & wild rice is OK), white potatoes (sweetpotatoes in moderation is OK), White bread or wheat bread or anything made out of white flour like bagels, donuts, rolls, buns, biscuits, cakes, pastries, cookies, pizza crust, and pasta (made from white flour & egg whites) - vegetarian pasta or spinach or wheat pasta is OK. Multigrain breads like Arnold's or Pepper[employer], or multigrain sandwich thins or flatbreads.  Diet, exercise and weight loss can reverse and cure diabetes in the early stages.  Diet, exercise and weight loss is very important in the control and prevention of complications of diabetes which affects every system in your body, ie. Brain - dementia/stroke, eyes - glaucoma/blindness, heart - heart attack/heart failure, kidneys - dialysis, stomach - gastric paralysis, intestines - malabsorption, nerves - severe painful neuritis, circulation - gangrene & loss of a leg(s), and finally cancer and Alzheimers.    I recommend avoid fried & greasy foods,  sweets/candy, white rice (brown or wild rice or Quinoa is OK), white potatoes (sweet potatoes are OK) - anything made from white flour - bagels, doughnuts, rolls, buns, biscuits,white  and wheat breads, pizza crust and traditional pasta made of white flour & egg white(vegetarian pasta or spinach or wheat pasta is OK).  Multi-grain bread is OK - like multi-grain flat bread or sandwich thins. Avoid alcohol in excess. Exercise is also important.    Eat all the vegetables you want - avoid meat, especially red meat and dairy - especially cheese.  Cheese is the most concentrated form of trans-fats which is the worst thing to clog up our arteries. Veggie cheese is OK which can be found in the fresh produce section at [employer] or [employer] or [employer]  ++++++++++++++++++++++ DASH Eating Plan  DASH stands for "Dietary Approaches to Stop Hypertension."   The DASH eating plan is a healthy eating plan that has been shown to reduce high blood pressure (hypertension). Additional health benefits may include reducing the risk of type 2 diabetes mellitus, heart disease, and stroke. The DASH eating plan may also help with weight loss. WHAT DO I NEED TO KNOW ABOUT THE DASH EATING PLAN? For the DASH eating plan, you will follow these general guidelines:  Choose foods with a percent daily value for sodium of less than 5% (as listed on the food label).  Use salt-free seasonings or herbs instead of table salt or sea salt.  Check with your health care provider or pharmacist before using salt substitutes.  Eat lower-sodium products, often labeled as "lower sodium" or "no salt added."  Eat fresh foods.  Eat more vegetables, fruits, and low-fat dairy products.  Choose whole grains. Look for the word "whole" as the first word in the ingredient list.  Choose fish   Limit sweets, desserts, sugars, and sugary drinks.  Choose heart-healthy fats.  Eat veggie cheese   Eat more home-cooked food and less restaurant, buffet, and fast food.  Limit fried foods.  Cook foods using methods other than frying.  Limit canned   canned vegetables. If you do use them, rinse them well to decrease the sodium.  When eating at a restaurant, ask that your food be prepared with less salt, or no salt if possible.                      WHAT FOODS CAN I EAT? Read Dr Fara Olden Fuhrman's books on The End of Dieting & The End of Diabetes  Grains Whole grain or whole wheat bread. Brown rice. Whole grain or whole wheat pasta. Quinoa, bulgur, and whole grain cereals. Low-sodium cereals. Corn or whole wheat flour tortillas. Whole grain cornbread. Whole grain crackers. Low-sodium crackers.  Vegetables Fresh or frozen vegetables (raw, steamed, roasted, or grilled). Low-sodium or reduced-sodium tomato and vegetable juices. Low-sodium or reduced-sodium tomato sauce and paste. Low-sodium or reduced-sodium canned vegetables.   Fruits All fresh, canned (in natural juice), or frozen fruits.  Protein Products  All fish and seafood.  Dried beans, peas, or lentils. Unsalted nuts and seeds. Unsalted canned beans.  Dairy Low-fat dairy products, such as skim or 1% milk, 2% or reduced-fat cheeses, low-fat ricotta or cottage cheese, or plain low-fat yogurt. Low-sodium or reduced-sodium cheeses.  Fats and Oils Tub margarines without trans fats. Light or reduced-fat mayonnaise and salad dressings (reduced sodium). Avocado. Safflower, olive, or canola oils. Natural peanut or almond butter.  Other Unsalted popcorn and pretzels. The items listed above may not be a complete list of recommended foods or beverages. Contact your dietitian for more options.  +++++++++++++++++++++++++++++++++++++++++++  WHAT FOODS ARE NOT RECOMMENDED? Grains/ White flour or wheat flour White bread. White pasta. White rice. Refined cornbread. Bagels and croissants. Crackers that contain trans fat.  Vegetables  Creamed or fried vegetables. Vegetables in a . Regular canned vegetables. Regular canned tomato sauce and paste. Regular  tomato and vegetable juices.  Fruits Dried fruits. Canned fruit in light or heavy syrup. Fruit juice.  Meat and Other Protein Products Meat in general - RED mwaet & White meat.  Fatty cuts of meat. Ribs, chicken wings, bacon, sausage, bologna, salami, chitterlings, fatback, hot dogs, bratwurst, and packaged luncheon meats.  Dairy Whole or 2% milk, cream, half-and-half, and cream cheese. Whole-fat or sweetened yogurt. Full-fat cheeses or blue cheese. Nondairy creamers and whipped toppings. Processed cheese, cheese spreads, or cheese curds.  Condiments Onion and garlic salt, seasoned salt, table salt, and sea salt. Canned and packaged gravies. Worcestershire sauce. Tartar sauce. Barbecue sauce. Teriyaki sauce. Soy sauce, including reduced sodium. Steak sauce. Fish sauce. Oyster sauce. Cocktail sauce. Horseradish. Ketchup and mustard. Meat flavorings and tenderizers. Bouillon cubes. Hot sauce. Tabasco sauce. Marinades. Taco seasonings. Relishes.  Fats and Oils Butter, stick margarine, lard, shortening and bacon fat. Coconut, palm kernel, or palm oils. Regular salad dressings.  Pickles and olives. Salted popcorn and pretzels.  The items listed above may not be a complete list of foods and beverages to avoid.

## 2016-05-23 NOTE — Progress Notes (Signed)
[facility]                   [name], M.D.    [name]. [name], P.A.-C      [name], P.A.-C   [facility]                [address] [place] [id number] Telephone [phone] Telefax [phone]  Annual Screening/Preventative Visit And Comprehensive Evaluation &  Examination     This very nice 65 y.o. MWF presents for a Wellness/Preventative Visit & comprehensive evaluation and management of multiple medical co-morbidities.  Patient has been followed for HTN, T2_NIDDM  Prediabetes, Hyperlipidemia and Vitamin D Deficiency.      HTN predates circa 1995.  Patient had negative & Normal Heart caths x 2 in 1995 & 2000. Patient's BP has been controlled at home and patient denies any cardiac symptoms as chest pain, palpitations, shortness of breath, dizziness or ankle swelling. Today's BP: 128/74      Patient's hyperlipidemia is controlled with diet and medications. Patient denies myalgias or other medication SE's. 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  Patient denies reactive hypoglycemic symptoms, visual blurring, diabetic polys, or paresthesias. Last A1c was back in normal range.  Lab Results  Component Value Date   HGBA1C 5.6 02/15/2016      Finally, patient has history of Vitamin D Deficiency of "28" in 2008  and last Vitamin D was  Lab Results  Component Value Date   VD25OH 78 02/15/2016   Current Outpatient Prescriptions on File Prior to Visit  Medication Sig  . acyclovir (ZOVIRAX) 400 MG tablet Take 1 tablet (400 mg total) by mouth daily.   Marland Kitchen acyclovir ointment (ZOVIRAX) 5 % Apply thin layer to affected area daily  . aspirin 81 MG tablet Take 81 mg by mouth daily.  . Biotin 10 MG TABS Take 10,000 each by mouth daily.  . Calcium Citrate-Vitamin D (CALCIUM CITRATE + D3 PO) Take 600 mg by mouth daily.   . Ferrous Sulfate (SLOW RELEASE IRON PO) Take by mouth daily.  . fish oil-omega-3 fatty acids 1000 MG capsule Take 1 g by mouth daily.  . fluticasone (FLONASE) 50 MCG/ACT nasal spray Place 2 sprays into both nostrils daily.  . hydrochlorothiazide (HYDRODIURIL) 25 MG tablet   . levothyroxine (SYNTHROID, LEVOTHROID) 75 MCG tablet TAKE ONE TABLET BY MOUTH ONCE DAILY  . Magnesium 250 MG TABS Take by mouth daily.  . potassium chloride 20 MEQ TBCR Take 10 mEq by mouth 2 (two) times daily.  . pravastatin (PRAVACHOL) 40 MG tablet TAKE ONE TABLET BY MOUTH ONCE DAILY  . ranitidine (ZANTAC) 300 MG tablet Take 1 to 2 tablets daily for heartburn & reflux  . rOPINIRole (REQUIP) 0.5 MG tablet 1-2 at night for restless legs  . vitamin B-12 (CYANOCOBALAMIN) 1000 MCG tablet Take 1,000 mcg by mouth daily.   . Vitamin D, Ergocalciferol, (DRISDOL) 50000 units CAPS capsule TAKE ONE CAPSULE BY MOUTH  ONCE DAILY   No current facility-administered medications on file prior to visit.    Allergies  Allergen Reactions  . Codeine Other (See Comments)    "puts me out of it".  Dysphoria.  Patient tolerates Tramadol without complications.  . Nabumetone     GI upset   Past Medical History:  Diagnosis Date  . Allergic rhinitis   . Arthritis   . Cholecystitis, chronic   . Dysrhythmia    Hx bradycardia - hx normal cardiac cath x2 / ECHO 2009 EF 60% LAE MYOVIEW 3/12 EF 60% NO ISCHEMIA  . Hyperlipidemia   . Hypertension    no treatment after 120 lb weight loss  . Hypothyroidism   . Prediabetes   . Thyroid nodule    Health Maintenance  Topic Date Due  . FOOT EXAM  [birth year]/06/1962  . OPHTHALMOLOGY EXAM  05/06/1962  . PNEUMOCOCCAL POLYSACCHARIDE VACCINE (2)  10/18/2013  . PAP SMEAR  12/20/2013  . MAMMOGRAM  07/25/2014  . URINE MICROALBUMIN  05/03/2016  . INFLUENZA VACCINE  05/29/2016  . TETANUS/TDAP  08/08/2016  . HEMOGLOBIN A1C  08/16/2016  . COLONOSCOPY  03/17/2024  . ZOSTAVAX  Completed  . Hepatitis C Screening  Completed  . HIV Screening  Completed   Immunization History  Administered Date(s) Administered  . Influenza Split 08/25/2013, 09/02/2014, 08/10/2015  . PPD Test 02/23/2014, 05/23/2016  . Pneumococcal Polysaccharide-23 10/18/2008  . Td 08/08/2006  . Tdap 05/23/2016  . Zoster 10/15/2012   Past Surgical History:  Procedure Laterality Date  . Bilateral carpal tunnel surgery    . BREATH TEK H PYLORI  07/01/2012   Procedure: BREATH TEK H PYLORI;  Surgeon: Edward Jolly, MD;  Location: [facility];  Service: General;  Laterality: N/A;  . CHOLECYSTECTOMY N/A 11/27/2013   Procedure: LAPAROSCOPIC CHOLECYSTECTOMY WITH INTRAOPERATIVE CHOLANGIOGRAM;  Surgeon: Pedro Earls, MD;  Location: [facility];  Service: General;  Laterality: N/A;  . GASTRIC ROUX-EN-Y N/A 12/16/2012   Procedure: LAPAROSCOPIC ROUX-EN-Y GASTRIC BYPASS WITH UPPER ENDOSCOPY;  Surgeon: Pedro Earls, MD;  Location: [facility];  Service: General;  Laterality: N/A;  Gastric Bypass  . NASAL SINUS SURGERY    . REPLACEMENT TOTAL KNEE BILATERAL     bilat  . TUBAL LIGATION     Family History  Problem Relation Age of Onset  . Breast cancer Mother   . Cancer Mother     breast cancer  . Anuerysm Mother     brain anuerysm  . Heart attack Father 53  . Heart attack Sister 87   Social History  Substance Use Topics  . Smoking status: Former Smoker    Packs/day: 0.80    Years: 10.00    Types: Cigarettes    Quit date: 10/30/1979  . Smokeless tobacco: Not on file  . Alcohol use No    ROS Constitutional: Denies fever, chills, weight loss/gain, headaches, insomnia,  night sweats, and change in appetite. Does c/o fatigue. Eyes: Denies redness, blurred vision, diplopia,  discharge, itchy, watery eyes.  ENT: Denies discharge, congestion, post nasal drip, epistaxis, sore throat, earache, hearing loss, dental pain, Tinnitus, Vertigo, Sinus pain, snoring.  Cardio: Denies chest pain, palpitations, irregular heartbeat, syncope, dyspnea, diaphoresis, orthopnea, PND, claudication, edema Respiratory: denies cough, dyspnea, DOE, pleurisy, hoarseness, laryngitis, wheezing.  Gastrointestinal: Denies dysphagia, heartburn, reflux, water brash, pain, cramps, nausea, vomiting, bloating, diarrhea, constipation, hematemesis, melena, hematochezia, jaundice, hemorrhoids Genitourinary: Denies dysuria, frequency, urgency, nocturia, hesitancy, discharge, hematuria, flank pain Breast: Breast lumps, nipple discharge, bleeding.  Musculoskeletal:  Denies arthralgia, myalgia, stiffness, Jt. Swelling, pain, limp, and strain/sprain. Denies falls. Skin: Denies puritis, rash, hives, warts, acne, eczema, changing in skin lesion Neuro: No weakness, tremor, incoordination, spasms, paresthesia, pain Psychiatric: Denies confusion, memory loss, sensory loss. Denies Depression. Endocrine: Denies change in weight, skin, hair change, nocturia, and paresthesia, diabetic polys, visual blurring, hyper / hypo glycemic episodes.  Heme/Lymph: No excessive bleeding, bruising, enlarged lymph nodes.  Physical Exam  BP 128/74   Pulse (!) 48   Temp 97.5 F (36.4 C)   Resp 16   Ht 5\' 3"  (1.6 m)   Wt 159 lb 3.2 oz (72.2 kg)   LMP  (LMP Unknown)   BMI 28.20 kg/m   General Appearance: Well nourished and in no apparent distress. Eyes: PERRLA, EOMs, conjunctiva no swelling or erythema, normal fundi and vessels. Sinuses: No frontal/maxillary tenderness ENT/Mouth: EACs patent / TMs  nl. Nares clear without erythema, swelling, mucoid exudates. Oral hygiene is good. No erythema, swelling, or exudate. Tongue normal, non-obstructing. Tonsils not swollen or erythematous. Hearing normal.  Neck: Supple, thyroid normal.  No bruits, nodes or JVD. Respiratory: Respiratory effort normal.  BS equal and clear bilateral without rales, rhonci, wheezing or stridor. Cardio: Heart sounds are normal with regular rate and rhythm and no murmurs, rubs or gallops. Peripheral pulses are normal and equal bilaterally without edema. No aortic or femoral bruits. Chest: symmetric with normal excursions and percussion. Breasts: Symmetric, without lumps, nipple discharge, retractions, or fibrocystic changes.  Abdomen: Flat, soft with bowel sounds active. Nontender, no guarding, rebound, hernias, masses, or organomegaly.  Lymphatics: Non tender without lymphadenopathy.  Genitourinary:  Musculoskeletal: Full ROM all peripheral extremities, joint stability, 5/5 strength, and normal gait. Skin: Warm and dry without rashes, lesions, cyanosis, clubbing or  ecchymosis.  Neuro: Cranial nerves intact, reflexes equal bilaterally. Normal muscle tone, no cerebellar symptoms. Sensation intact.  Pysch: Alert and oriented X 3, normal affect, Insight and Judgment appropriate.   Assessment and Plan  1. Annual Preventative Screening Examination  - Microalbumin / creatinine urine ratio - EKG 12-Lead - [place], RETROPERITNL ABD,  LTD - POC Hemoccult Bld/Stl  - Urinalysis, Routine w reflex microscopic  - Vitamin B12 - Iron and TIBC - HM DIABETES FOOT EXAM - LOW EXTREMITY NEUR EXAM DOCUM - CBC with Differential/Platelet - BASIC METABOLIC PANEL WITH GFR - Hepatic function panel - Magnesium - Lipid panel - TSH - Hemoglobin A1c - Insulin, random - VITAMIN D 25 Hydroxy  2. Essential hypertension  - EKG 12-Lead - [place], RETROPERITNL ABD,  LTD - TSH  3. Hyperlipidemia  - EKG 12-Lead - [place], RETROPERITNL ABD,  LTD - Lipid panel - TSH  4. Prediabetes   5. Type 2 diabetes mellitus with diabetic nephropathy, unspecified long term insulin use status (HCC)  - Microalbumin / creatinine urine ratio - EKG 12-Lead - [place], RETROPERITNL ABD,  LTD -  HM DIABETES FOOT EXAM - LOW EXTREMITY NEUR EXAM DOCUM - Hemoglobin A1c - Insulin, random  6. Vitamin D deficiency  - VITAMIN D 25 Hydroxy  7. Hypothyroidism  - TSH  8. Encounter for screening for malignant neoplasm of rectum  - POC Hemoccult Bld/Stl   9. Screening for ischemic heart disease  - EKG 12-Lead  10. Screening for AAA (aortic abdominal aneurysm)  - [place], RETROPERITNL ABD,  LTD  11. Other fatigue  - Vitamin B12 - Iron and TIBC - CBC with Differential/Platelet - TSH  12. Medication management  - Urinalysis, Routine w reflex microscopic  - CBC  with Differential/Platelet - BASIC METABOLIC PANEL WITH GFR - Hepatic function panel - Magnesium  13. Type 2 diabetes mellitus with diabetic nephropathy, without long-term current use of insulin ([facility])   14. Screening examination for pulmonary tuberculosis  - PPD  15. Need for prophylactic vaccination with combined diphtheria-tetanus-pertussis (DTP) vaccine  - Tdap vaccine greater than or equal to 7yo IM   Continue prudent diet as discussed, weight control, BP monitoring, regular exercise, and medications. Discussed med's effects and SE's. Screening labs and tests as requested with regular follow-up as recommended. Over 40 minutes of exam, counseling, chart review and high complex critical decision making was performed.

## 2016-05-24 ENCOUNTER — Other Ambulatory Visit: Payer: Self-pay | Admitting: Internal Medicine

## 2016-05-24 LAB — URINALYSIS, MICROSCOPIC ONLY
CASTS: NONE SEEN [LPF]
CRYSTALS: NONE SEEN [HPF]
RBC / HPF: NONE SEEN RBC/HPF (ref <=2)
WBC, UA: >60 WBC/HPF — AB (ref <=5)
YEAST: NONE SEEN [HPF]

## 2016-05-24 LAB — MICROALBUMIN / CREATININE URINE RATIO
Creatinine, Urine: 97 mg/dL (ref 20–320)
MICROALB UR: 1 mg/dL
Microalb Creat Ratio: 10 mcg/mg creat (ref <30)

## 2016-05-24 LAB — URINALYSIS, ROUTINE W REFLEX MICROSCOPIC
Bilirubin Urine: NEGATIVE
Glucose, UA: NEGATIVE
HGB URINE DIPSTICK: NEGATIVE
Ketones, ur: NEGATIVE
NITRITE: NEGATIVE
PH: 5.5 (ref 5.0–8.0)
Protein, ur: NEGATIVE
Specific Gravity, Urine: 1.012 (ref 1.001–1.035)

## 2016-05-24 LAB — VITAMIN D 25 HYDROXY (VIT D DEFICIENCY, FRACTURES): Vit D, 25-Hydroxy: 80 ng/mL (ref 30–100)

## 2016-05-24 LAB — INSULIN, RANDOM: INSULIN: 2.8 u[IU]/mL (ref 2.0–19.6)

## 2016-05-24 MED ORDER — CIPROFLOXACIN HCL 250 MG PO TABS: ORAL_TABLET | ORAL | 0 refills | Status: AC

## 2016-05-29 ENCOUNTER — Encounter: Payer: Self-pay | Admitting: Internal Medicine

## 2016-05-29 LAB — TB SKIN TEST
Induration: 0 mm
TB SKIN TEST: NEGATIVE

## 2016-06-06 ENCOUNTER — Ambulatory Visit (INDEPENDENT_AMBULATORY_CARE_PROVIDER_SITE_OTHER): Payer: Federal, State, Local not specified - PPO | Admitting: Podiatry

## 2016-06-06 ENCOUNTER — Encounter: Payer: Self-pay | Admitting: Podiatry

## 2016-06-06 VITALS — BP 148/74 | HR 69 | Resp 12

## 2016-06-06 DIAGNOSIS — L600 Ingrowing nail: Secondary | ICD-10-CM | POA: Diagnosis not present

## 2016-06-06 MED ORDER — NEOMYCIN-POLYMYXIN-HC 3.5-10000-1 OT SOLN: OTIC | 0 refills | Status: DC

## 2016-06-06 NOTE — Progress Notes (Signed)
She presents today with chief complaint of ingrown toenail to the tibial border hallux left. States his been bothering her for quite some time now.  Objective: Vital signs are stable alert and oriented 3. Pulses are palpable. Neurologic sensorium is intact. Sharp incurvated nail margin with erythema and edema along the tibial border hallux left is was a little tender on palpation yields an ingrown nail.  Assessment: Ingrown nail tibial border hallux left.  Plan: Chemical weight secondary was performed to the tibial border today after local anesthesia was administered she tolerated the procedure well and a prescription for Cortisporin Otic was sent over electronically today to her pharmacy. We also provided her with both oral and written home-going instructions for care and soaking her toe and I will follow-up with her in 1 week.

## 2016-06-06 NOTE — Patient Instructions (Signed)

## 2016-06-18 ENCOUNTER — Encounter: Payer: Self-pay | Admitting: Podiatry

## 2016-06-18 ENCOUNTER — Ambulatory Visit (INDEPENDENT_AMBULATORY_CARE_PROVIDER_SITE_OTHER): Payer: Federal, State, Local not specified - PPO | Admitting: Podiatry

## 2016-06-18 DIAGNOSIS — L600 Ingrowing nail: Secondary | ICD-10-CM

## 2016-06-18 NOTE — Progress Notes (Signed)
She presents today for follow-up of her matrixectomy hallux left. She denies fever chills nausea vomiting muscle aches and pains. He continues to soak in Betadine and warm water.  Objective: Vital signs are stable alert and oriented 3. Pulses are palpable. Neurologic sensorium is intact. Surgical site along the tibial and fibular margins appear to be healing nicely SE no signs of infection.  Assessment: Well-healed matrixectomy hallux left.  Plan: Discontinue Betadine*with Epsom salts and warm water soaks covered in the daytime leave open at bedtime. Continue to soak until completely resolved 100% if she would develops pain or drainage she is to call us immediately.

## 2016-06-18 NOTE — Patient Instructions (Signed)

## 2016-06-25 ENCOUNTER — Other Ambulatory Visit: Payer: Self-pay | Admitting: Physician Assistant

## 2016-07-19 ENCOUNTER — Encounter: Payer: Self-pay | Admitting: Internal Medicine

## 2016-07-19 ENCOUNTER — Ambulatory Visit (INDEPENDENT_AMBULATORY_CARE_PROVIDER_SITE_OTHER): Payer: Federal, State, Local not specified - PPO | Admitting: Internal Medicine

## 2016-07-19 VITALS — BP 138/82 | HR 52 | Temp 98.2°F | Resp 16 | Ht 63.0 in | Wt 162.0 lb

## 2016-07-19 DIAGNOSIS — Z2089 Contact with and (suspected) exposure to other communicable diseases: Secondary | ICD-10-CM

## 2016-07-19 DIAGNOSIS — J029 Acute pharyngitis, unspecified: Secondary | ICD-10-CM | POA: Diagnosis not present

## 2016-07-19 DIAGNOSIS — Z20818 Contact with and (suspected) exposure to other bacterial communicable diseases: Secondary | ICD-10-CM

## 2016-07-19 MED ORDER — AZITHROMYCIN 250 MG PO TABS: ORAL_TABLET | ORAL | 0 refills | Status: DC

## 2016-07-19 MED ORDER — PREDNISONE 20 MG PO TABS: ORAL_TABLET | ORAL | 0 refills | Status: DC

## 2016-07-19 MED ORDER — LIDOCAINE VISCOUS 2 % MT SOLN: 20.0000 mL | OROMUCOSAL | 0 refills | Status: DC | PRN

## 2016-07-19 NOTE — Patient Instructions (Signed)
Take the zpak until it is gone.  Take the prednisone as prescribed until it is gone.  Please take zantac twice daily.  Please take claritin or zyrtec   Please use flonase every day.  Please use the lidocaine up to 3 times per day.  You can swish and gargle or you can swallow.  Up to you.

## 2016-07-19 NOTE — Progress Notes (Signed)
HPI  Patient presents to the office for evaluation of sore throat and cough.  It has been going on for 4 days.  Patient reports night > day, dry, barky, worse with lying down.  They also endorse change in voice, chills, fever, postnasal drip and clear rhinorrhea, sore throat, headache, body aches. .  They have tried mucinex, flonase.  They report that nothing has worked.  They admits to other sick contacts.  She reports that her grandchildren that she takes care of both have strep throat.    Review of Systems  Constitutional: Positive for malaise/fatigue. Negative for chills and fever.  HENT: Positive for congestion, ear pain, hearing loss and sore throat.   Respiratory: Positive for cough. Negative for sputum production, shortness of breath and wheezing.   Cardiovascular: Negative for chest pain, palpitations and leg swelling.  Neurological: Positive for headaches.    PE:  Vitals:   07/19/16 0958  BP: 138/82  Pulse: (!) 52  Resp: 16  Temp: 98.2 F (36.8 C)   General:  Alert and non-toxic, WDWN, NAD HEENT: NCAT, PERLA, EOM normal, no occular discharge or erythema.  Nasal mucosal edema with sinus tenderness to palpation.  Oropharynx clear with minimal oropharyngeal edema and erythema.  Mucous membranes moist and pink. Neck:  Cervical adenopathy Chest:  RRR no MRGs.  Lungs clear to auscultation A&P with no wheezes rhonchi or rales.   Abdomen: +BS x 4 quadrants, soft, non-tender, no guarding, rigidity, or rebound. Skin: warm and dry no rash Neuro: A&Ox4, CN II-XII grossly intact  Assessment and Plan:   1. Exposure to strep throat -treat prophylactically  2. Acute pharyngitis, unspecified etiology -zpak -prednisone -viscous lidocaine

## 2016-07-31 ENCOUNTER — Other Ambulatory Visit: Payer: Self-pay | Admitting: Internal Medicine

## 2016-07-31 MED ORDER — AZITHROMYCIN 250 MG PO TABS: ORAL_TABLET | ORAL | 0 refills | Status: DC

## 2016-07-31 NOTE — Progress Notes (Signed)
Patient reports continued sinus drainage since visit on 07/19/16.  Requesting second zpak.  Will send it in.

## 2016-08-10 ENCOUNTER — Ambulatory Visit (INDEPENDENT_AMBULATORY_CARE_PROVIDER_SITE_OTHER): Payer: Federal, State, Local not specified - PPO | Admitting: *Deleted

## 2016-08-10 DIAGNOSIS — Z23 Encounter for immunization: Secondary | ICD-10-CM | POA: Diagnosis not present

## 2016-09-05 ENCOUNTER — Ambulatory Visit: Payer: Self-pay | Admitting: Physician Assistant

## 2016-09-24 ENCOUNTER — Ambulatory Visit (INDEPENDENT_AMBULATORY_CARE_PROVIDER_SITE_OTHER): Payer: Federal, State, Local not specified - PPO | Admitting: Physician Assistant

## 2016-09-24 ENCOUNTER — Other Ambulatory Visit: Payer: Self-pay | Admitting: Physician Assistant

## 2016-09-24 ENCOUNTER — Encounter: Payer: Self-pay | Admitting: Physician Assistant

## 2016-09-24 VITALS — BP 134/80 | HR 52 | Temp 98.0°F | Resp 16 | Ht 63.0 in | Wt 160.0 lb

## 2016-09-24 DIAGNOSIS — R1031 Right lower quadrant pain: Secondary | ICD-10-CM

## 2016-09-24 DIAGNOSIS — Z79899 Other long term (current) drug therapy: Secondary | ICD-10-CM | POA: Diagnosis not present

## 2016-09-24 DIAGNOSIS — G2581 Restless legs syndrome: Secondary | ICD-10-CM

## 2016-09-24 DIAGNOSIS — E559 Vitamin D deficiency, unspecified: Secondary | ICD-10-CM

## 2016-09-24 DIAGNOSIS — E785 Hyperlipidemia, unspecified: Secondary | ICD-10-CM

## 2016-09-24 DIAGNOSIS — R42 Dizziness and giddiness: Secondary | ICD-10-CM | POA: Diagnosis not present

## 2016-09-24 DIAGNOSIS — I10 Essential (primary) hypertension: Secondary | ICD-10-CM | POA: Diagnosis not present

## 2016-09-24 LAB — HEPATIC FUNCTION PANEL
ALBUMIN: 3.7 g/dL (ref 3.6–5.1)
ALK PHOS: 89 U/L (ref 33–130)
ALT: 16 U/L (ref 6–29)
AST: 28 U/L (ref 10–35)
Bilirubin, Direct: 0.2 mg/dL (ref <=0.2)
Indirect Bilirubin: 0.8 mg/dL (ref 0.2–1.2)
TOTAL PROTEIN: 6.4 g/dL (ref 6.1–8.1)
Total Bilirubin: 1 mg/dL (ref 0.2–1.2)

## 2016-09-24 LAB — CBC WITH DIFFERENTIAL/PLATELET
BASOS PCT: 1 %
Basophils Absolute: 47 cells/uL (ref 0–200)
EOS ABS: 235 {cells}/uL (ref 15–500)
Eosinophils Relative: 5 %
HEMATOCRIT: 35.7 % (ref 35.0–45.0)
Hemoglobin: 11.5 g/dL — ABNORMAL LOW (ref 11.7–15.5)
LYMPHS PCT: 47 %
Lymphs Abs: 2209 cells/uL (ref 850–3900)
MCH: 29.8 pg (ref 27.0–33.0)
MCHC: 32.2 g/dL (ref 32.0–36.0)
MCV: 92.5 fL (ref 80.0–100.0)
MONO ABS: 423 {cells}/uL (ref 200–950)
MPV: 9.8 fL (ref 7.5–12.5)
Monocytes Relative: 9 %
Neutro Abs: 1786 cells/uL (ref 1500–7800)
Neutrophils Relative %: 38 %
Platelets: 400 10*3/uL (ref 140–400)
RBC: 3.86 MIL/uL (ref 3.80–5.10)
RDW: 13.6 % (ref 11.0–15.0)
WBC: 4.7 10*3/uL (ref 3.8–10.8)

## 2016-09-24 LAB — BASIC METABOLIC PANEL WITH GFR
BUN: 7 mg/dL (ref 7–25)
CHLORIDE: 106 mmol/L (ref 98–110)
CO2: 26 mmol/L (ref 20–31)
Calcium: 9 mg/dL (ref 8.6–10.4)
Creat: 0.92 mg/dL (ref 0.50–0.99)
GFR, Est African American: 76 mL/min (ref >=60)
GFR, Est Non African American: 66 mL/min (ref >=60)
GLUCOSE: 81 mg/dL (ref 65–99)
POTASSIUM: 4.2 mmol/L (ref 3.5–5.3)
Sodium: 141 mmol/L (ref 135–146)

## 2016-09-24 LAB — TSH: TSH: 0.78 mIU/L

## 2016-09-24 LAB — LIPID PANEL
Cholesterol: 138 mg/dL (ref <200)
HDL: 64 mg/dL (ref >50)
LDL CALC: 64 mg/dL (ref <100)
Total CHOL/HDL Ratio: 2.2 Ratio (ref <5.0)
Triglycerides: 48 mg/dL (ref <150)
VLDL: 10 mg/dL (ref <30)

## 2016-09-24 LAB — MAGNESIUM: MAGNESIUM: 2.1 mg/dL (ref 1.5–2.5)

## 2016-09-24 MED ORDER — ELUXADOLINE 75 MG PO TABS: 75.0000 mg | ORAL_TABLET | Freq: Two times a day (BID) | ORAL | 3 refills | Status: DC

## 2016-09-24 MED ORDER — ROPINIROLE HCL 0.5 MG PO TABS: ORAL_TABLET | ORAL | 0 refills | Status: DC

## 2016-09-24 NOTE — Progress Notes (Signed)
Assessment and Plan:  1. Essential hypertension - continue medications, DASH diet, exercise and monitor at home. Call if greater than 130/80.  - CBC with Differential/Platelet - BASIC METABOLIC PANEL WITH GFR - Hepatic function panel - TSH  2. Hyperlipidemia, unspecified hyperlipidemia type -continue medications, check lipids, decrease fatty foods, increase activity.  - Lipid panel  3. Vitamin D deficiency - VITAMIN D 25 Hydroxy (Vit-D Deficiency, Fractures)  4. Medication management - Magnesium  5. RLS (restless legs syndrome) Check labs, increase requip to 1 pill 5pm and 1 at bed time, if this does not help we will try klonopin  6. RLQ abdominal pain Mild diffuse tenderness, normal [place], + IBs symptoms, will try viberzi, but follow up with GI, if any worsening pain go to ER   Continue diet and meds as discussed. Further disposition pending results of labs. Future Appointments Date Time Provider [place]  09/24/2016 11:00 AM Vicie Mutters, PA-C [facility] None  12/19/2016 11:30 AM [name], MD [facility] None  06/20/2017 9:00 AM [name], MD [facility] None    HPI 65 y.o. female  presents for 3 month follow up with hypertension, hyperlipidemia, prediabetes and vitamin D, she had gastric bypass and has done a great job with weight loss getting off her meds.   Her blood pressure has been controlled at home, she is back on half of lisinopril 10mg  from Dr. Percival Spanish, today their BP is BP: 134/80  She does not workout. She denies chest pain, shortness of breath, dizziness.  She is on cholesterol medication, pravastatin 40mg  1/2 pill every other day but has been off due to elevated liver function. and denies myalgias. Her cholesterol is at goal. The cholesterol last visit was:   Lab Results  Component Value Date   CHOL 138 05/23/2016   HDL 79 05/23/2016   LDLCALC 50 05/23/2016   TRIG 43 05/23/2016   CHOLHDL 1.7 05/23/2016    She has been working on diet and  exercise for prediabetes, and denies paresthesia of the feet, polydipsia, polyuria and visual disturbances. Last A1C in the office was:  Lab Results  Component Value Date   HGBA1C 5.5 05/23/2016   Patient is on Vitamin D supplement, on 50,000 QOD.   Lab Results  Component Value Date   VD25OH 53 05/23/2016     She is on thyroid medication. Her medication was not changed last visit.   Lab Results  Component Value Date   TSH 1.06 05/23/2016  .  BMI is Body mass index is 28.34 kg/m., she is working on diet and exercise. Wt Readings from Last 3 Encounters:  09/24/16 160 lb (72.6 kg)  07/19/16 162 lb (73.5 kg)  05/23/16 159 lb 3.2 oz (72.2 kg)   She still complains of bilateral leg pain, worse when she lies down at night, better when she moves them, will have some foot burning as well. She will take requip 0.5 at 8 or 9 PM, if she takes two will feel groggy in the morning.  She will have RLQ pain intermittent x 1-2 years, worse this last time, normal AB [place], will check labs and refer to GI.  Lab Results  Component Value Date   IRON 79 05/23/2016   TIBC 345 05/23/2016   FERRITIN 14 (L) 03/07/2016   Lab Results  Component Value Date   ALT 16 05/23/2016   AST 23 05/23/2016   ALKPHOS 103 05/23/2016   BILITOT 1.3 (H) 05/23/2016    Lab Results  Component Value Date  [id number] 1,263 (H) 05/23/2016    Current Medications:  Current Outpatient Prescriptions on File Prior to Visit  Medication Sig Dispense Refill  . acyclovir (ZOVIRAX) 400 MG tablet Take 1 tablet (400 mg total) by mouth daily. 90 tablet 1  . acyclovir ointment (ZOVIRAX) 5 % Apply thin layer to affected area daily 15 g 0  . aspirin 81 MG tablet Take 81 mg by mouth daily.    . Biotin 10 MG TABS Take 10,000 each by mouth daily.    . Calcium Citrate-Vitamin D (CALCIUM CITRATE + D3 PO) Take 600 mg by mouth daily.     . Ferrous Sulfate (SLOW RELEASE IRON PO) Take by mouth daily.    . fish oil-omega-3 fatty acids 1000 MG  capsule Take 1 g by mouth daily.    . fluticasone (FLONASE) 50 MCG/ACT nasal spray Place 2 sprays into both nostrils daily. 16 g 0  . hydrochlorothiazide (HYDRODIURIL) 25 MG tablet     . levothyroxine (SYNTHROID, LEVOTHROID) 75 MCG tablet TAKE ONE TABLET BY MOUTH ONCE DAILY 90 tablet 1  . lidocaine (XYLOCAINE) 2 % solution Use as directed 20 mLs in the mouth or throat as needed for mouth pain. 100 mL 0  . Magnesium 250 MG TABS Take by mouth daily.    Marland Kitchen neomycin-polymyxin-hydrocortisone (CORTISPORIN) otic solution Apply one to two drops to toe after soaking twice daily. 10 mL 0  . potassium chloride (K-DUR) 10 MEQ tablet TAKE ONE TABLET BY MOUTH TWICE DAILY 120 tablet 1  . pravastatin (PRAVACHOL) 40 MG tablet TAKE ONE TABLET BY MOUTH ONCE DAILY 90 tablet 2  . ranitidine (ZANTAC) 300 MG tablet Take 1 to 2 tablets daily for heartburn & reflux 180 tablet 1  . rOPINIRole (REQUIP) 0.5 MG tablet START BY TAKING 1/2 TABLET AT NIGHT, CAN INCREASE TO 1 TABLET, THEN MAY INCREASE UP TO 4 TABLETS AT NIGHT 90 tablet 0  . topiramate (TOPAMAX) 25 MG tablet     . vitamin B-12 (CYANOCOBALAMIN) 1000 MCG tablet Take 1,000 mcg by mouth daily.     . Vitamin D, Ergocalciferol, (DRISDOL) 50000 units CAPS capsule TAKE ONE CAPSULE BY MOUTH ONCE DAILY 90 capsule 1   No current facility-administered medications on file prior to visit.    Medical History:  Past Medical History:  Diagnosis Date  . Allergic rhinitis   . Arthritis   . Cholecystitis, chronic   . Dysrhythmia    Hx bradycardia - hx normal cardiac cath x2 / ECHO 2009 EF 60% LAE MYOVIEW 3/12 EF 60% NO ISCHEMIA  . Hyperlipidemia   . Hypertension    no treatment after 120 lb weight loss  . Hypothyroidism   . Prediabetes   . Thyroid nodule    Allergies:  Allergies  Allergen Reactions  . Codeine Other (See Comments)    "puts me out of it".  Dysphoria.  Patient tolerates Tramadol without complications.  . Nabumetone     GI upset     Review of  Systems:  Review of Systems  Constitutional: Negative.   HENT: Negative.   Eyes: Negative.   Respiratory: Negative.   Cardiovascular: Negative.   Gastrointestinal: Positive for abdominal pain (right lower quadrant, has gotten progressively worse, no accompaniments, nothing better or worse) and diarrhea. Negative for blood in stool, constipation, heartburn, melena, nausea and vomiting.  Genitourinary: Negative.   Musculoskeletal: Positive for back pain, myalgias (worse at night/rest, better with movement) and neck pain. Negative for falls and joint pain.  Skin: Negative.  Neurological: Negative.   Endo/Heme/Allergies: Negative.   Psychiatric/Behavioral: Negative.     Family history- Review and unchanged Social history- Review and unchanged Physical Exam: BP 134/80   Pulse (!) 52   Temp 98 F (36.7 C) (Temporal)   Resp 16   Ht 5\' 3"  (1.6 m)   Wt 160 lb (72.6 kg)   LMP  (LMP Unknown)   BMI 28.34 kg/m  Wt Readings from Last 3 Encounters:  09/24/16 160 lb (72.6 kg)  07/19/16 162 lb (73.5 kg)  05/23/16 159 lb 3.2 oz (72.2 kg)   General Appearance: Well nourished, in no apparent distress. Eyes: PERRLA, EOMs, conjunctiva no swelling or erythema Sinuses: No Frontal/maxillary tenderness ENT/Mouth: Ext aud canals clear, TMs without erythema, bulging. No erythema, swelling, or exudate on post pharynx.  Tonsils not swollen or erythematous. Hearing normal.  Neck: Supple, thyroid normal.  Respiratory: Respiratory effort normal, BS equal bilaterally without rales, rhonchi, wheezing or stridor.  Cardio: RRR with no MRGs. Brisk peripheral pulses without edema.  Abdomen: Soft, + BS,  Mild RLQ tender, no guarding, rebound, hernias, masses. Lymphatics: Non tender without lymphadenopathy.  Musculoskeletal: Full ROM, 5/5 strength, Normal gait.  Skin: Warm, dry without rashes, lesions, ecchymosis.  Neuro: Cranial nerves intact. Normal muscle tone, no cerebellar symptoms. Psych: Awake and  oriented X 3, normal affect, Insight and Judgment appropriate.    Vicie Mutters, PA-C 10:53 AM [facility]

## 2016-09-24 NOTE — Patient Instructions (Addendum)
Follow up GI for AB pain If worse go to ER Try viberzi samples once daily for 1 week and then then try twice daily   Take requip around 5 and then 8 pm If this does not help I will call in a different medication for you to take We will stop the topamax.   Abdominal Pain, Adult Abdominal pain can be caused by many things. Often, abdominal pain is not serious and it gets better with no treatment or by being treated at home. However, sometimes abdominal pain is serious. Your health care provider will do a medical history and a physical exam to try to determine the cause of your abdominal pain. Follow these instructions at home:  Take over-the-counter and prescription medicines only as told by your health care provider. Do not take a laxative unless told by your health care provider.  Drink enough fluid to keep your urine clear or pale yellow.  Watch your condition for any changes.  Keep all follow-up visits as told by your health care provider. This is important. Contact a health care provider if:  Your abdominal pain changes or gets worse.  You are not hungry or you lose weight without trying.  You are constipated or have diarrhea for more than 2-3 days.  You have pain when you urinate or have a bowel movement.  Your abdominal pain wakes you up at night.  Your pain gets worse with meals, after eating, or with certain foods.  You are throwing up and cannot keep anything down.  You have a fever. Get help right away if:  Your pain does not go away as soon as your health care provider told you to expect.  You cannot stop throwing up.  Your pain is only in areas of the abdomen, such as the right side or the left lower portion of the abdomen.  You have bloody or black stools, or stools that look like tar.  You have severe pain, cramping, or bloating in your abdomen.  You have signs of dehydration, such as:  Dark urine, very little urine, or no urine.  Cracked lips.  Dry  mouth.  Sunken eyes.  Sleepiness.  Weakness. This information is not intended to replace advice given to you by your health care provider. Make sure you discuss any questions you have with your health care provider. Document Released: 07/25/2005 Document Revised: 05/04/2016 Document Reviewed: 03/28/2016 Elsevier Interactive Patient Education  2017 [employer].

## 2016-09-25 LAB — FERRITIN: FERRITIN: 62 ng/mL (ref 20–288)

## 2016-09-25 LAB — IRON AND TIBC
%SAT: 20 % (ref 11–50)
Iron: 59 ug/dL (ref 45–160)
TIBC: 298 ug/dL (ref 250–450)
UIBC: 239 ug/dL (ref 125–400)

## 2016-09-25 LAB — VITAMIN D 25 HYDROXY (VIT D DEFICIENCY, FRACTURES): Vit D, 25-Hydroxy: 74 ng/mL (ref 30–100)

## 2016-10-10 ENCOUNTER — Ambulatory Visit: Payer: Self-pay | Admitting: Internal Medicine

## 2016-10-10 ENCOUNTER — Encounter: Payer: Self-pay | Admitting: Physician Assistant

## 2016-10-10 ENCOUNTER — Ambulatory Visit (INDEPENDENT_AMBULATORY_CARE_PROVIDER_SITE_OTHER): Payer: Federal, State, Local not specified - PPO | Admitting: Physician Assistant

## 2016-10-10 ENCOUNTER — Ambulatory Visit (HOSPITAL_COMMUNITY)
Admission: RE | Admit: 2016-10-10 | Discharge: 2016-10-10 | Disposition: A | Discharge status: Home / Self Care | Payer: Federal, State, Local not specified - PPO | Source: Ambulatory Visit | Attending: Physician Assistant | Admitting: Physician Assistant

## 2016-10-10 VITALS — BP 130/70 | HR 75 | Resp 14 | Ht 63.0 in | Wt 161.0 lb

## 2016-10-10 DIAGNOSIS — M542 Cervicalgia: Secondary | ICD-10-CM | POA: Diagnosis not present

## 2016-10-10 DIAGNOSIS — J0100 Acute maxillary sinusitis, unspecified: Secondary | ICD-10-CM | POA: Diagnosis not present

## 2016-10-10 DIAGNOSIS — M47892 Other spondylosis, cervical region: Secondary | ICD-10-CM | POA: Insufficient documentation

## 2016-10-10 DIAGNOSIS — R079 Chest pain, unspecified: Secondary | ICD-10-CM

## 2016-10-10 LAB — CBC WITH DIFFERENTIAL/PLATELET
BASOS PCT: 1 %
Basophils Absolute: 62 cells/uL (ref 0–200)
EOS PCT: 2 %
Eosinophils Absolute: 124 cells/uL (ref 15–500)
HCT: 35.9 % (ref 35.0–45.0)
Hemoglobin: 11.8 g/dL (ref 11.7–15.5)
LYMPHS PCT: 32 %
Lymphs Abs: 1984 cells/uL (ref 850–3900)
MCH: 30 pg (ref 27.0–33.0)
MCHC: 32.9 g/dL (ref 32.0–36.0)
MCV: 91.3 fL (ref 80.0–100.0)
MONOS PCT: 12 %
MPV: 10.2 fL (ref 7.5–12.5)
Monocytes Absolute: 744 cells/uL (ref 200–950)
Neutro Abs: 3286 cells/uL (ref 1500–7800)
Neutrophils Relative %: 53 %
PLATELETS: 242 10*3/uL (ref 140–400)
RBC: 3.93 MIL/uL (ref 3.80–5.10)
RDW: 13.7 % (ref 11.0–15.0)
WBC: 6.2 10*3/uL (ref 3.8–10.8)

## 2016-10-10 LAB — D-DIMER, QUANTITATIVE (NOT AT ARMC): D DIMER QUANT: 0.43 ug{FEU}/mL (ref <0.50)

## 2016-10-10 MED ORDER — AZITHROMYCIN 250 MG PO TABS: ORAL_TABLET | ORAL | 1 refills | Status: AC

## 2016-10-10 MED ORDER — DEXAMETHASONE SODIUM PHOSPHATE 100 MG/10ML IJ SOLN: 10.0000 mg | Freq: Once | INTRAMUSCULAR | Status: DC

## 2016-10-10 MED ORDER — BENZONATATE 100 MG PO CAPS: 200.0000 mg | ORAL_CAPSULE | Freq: Three times a day (TID) | ORAL | 0 refills | Status: DC | PRN

## 2016-10-10 MED ORDER — PREDNISONE 20 MG PO TABS: ORAL_TABLET | ORAL | 0 refills | Status: DC

## 2016-10-10 NOTE — Patient Instructions (Signed)
Get on prilosec or pepcid while on the prednisone May need to send to GI for EGD to rule out ulcers since bypass  Get neck x ray  If you have any more chest pain with shortness of breath, dizziness, sweating, nausea, go to the ER  Continue low dose aspirin daily  Make sure you are on an allergy pill, see below for more details. Please take the prednisone as directed below, this is NOT an antibiotic so you do NOT have to finish it. You can take it for a few days and stop it if you are doing better.   Please take the prednisone to help decrease inflammation and therefore decrease symptoms. Take it it with food to avoid GI upset. It can cause increased energy but on the other hand it can make it hard to sleep at night so please take it AT [place], it takes 8-12 hours to start working so it will NOT affect your sleeping if you take it at night with your food!!  If you are diabetic it will increase your sugars so decrease carbs and monitor your sugars closely.     HOW TO TREAT VIRAL COUGH AND COLD SYMPTOMS:  -Symptoms usually last at least 1 week with the worst symptoms being around day 4.  - colds usually start with a sore throat and end with a cough, and the cough can take 2 weeks to get better.  -No antibiotics are needed for colds, flu, sore throats, cough, bronchitis UNLESS symptoms are longer than 7 days OR if you are getting better then get drastically worse.  -There are a lot of combination medications (Dayquil, Nyquil, Vicks 44, tyelnol cold and sinus, ETC). Please look at the ingredients on the back so that you are treating the correct symptoms and not doubling up on medications/ingredients.    Medicines you can use  Nasal congestion  - pseudoephedrine (Sudafed)- behind the counter, do not use if you have high blood pressure, medicine that have -D in them.  - phenylephrine (Sudafed PE) -Dextormethorphan + chlorpheniramine (Coridcidin HBP)- okay if you have high blood  pressure -Oxymetazoline (Afrin) nasal spray- LIMIT to 3 days -Saline nasal spray -Neti pot (used distilled or bottled water)  Ear pain/congestion  -pseudoephedrine (sudafed) - Nasonex/flonase nasal spray  Fever  -Acetaminophen (Tyelnol) -Ibuprofen (Advil, motrin, aleve)  Sore Throat  -Acetaminophen (Tyelnol) -Ibuprofen (Advil, motrin, aleve) -Drink a lot of water -Gargle with salt water - Rest your voice (don't talk) -Throat sprays -Cough drops  Body Aches  -Acetaminophen (Tyelnol) -Ibuprofen (Advil, motrin, aleve)  Headache  -Acetaminophen (Tyelnol) -Ibuprofen (Advil, motrin, aleve) - Exedrin, Exedrin Migraine  Allergy symptoms (cough, sneeze, runny nose, itchy eyes) -Claritin or loratadine cheapest but likely the weakest  -Zyrtec or certizine at night because it can make you sleepy -The strongest is allegra or fexafinadine  Cheapest at [employer], [employer], [employer]  Cough  -Dextromethorphan (Delsym)- medicine that has DM in it -Guafenesin (Mucinex/Robitussin) - cough drops - drink lots of water  Chest Congestion  -Guafenesin (Mucinex/Robitussin)  Red Itchy Eyes  - Naphcon-A  Upset Stomach  - Bland diet (nothing spicy, greasy, fried, and high acid foods like tomatoes, oranges, berries) -OKAY- cereal, bread, soup, crackers, rice -Eat smaller more frequent meals -reduce caffeine, no alcohol -Loperamide (Imodium-AD) if diarrhea -Prevacid for heart burn  General health when sick  -Hydration -wash your hands frequently -keep surfaces clean -change pillow cases and sheets often -Get fresh air but do not exercise strenuously -Vitamin D,  double up on it - Vitamin C -Zinc      Nonspecific Chest Pain Chest pain can be caused by many different conditions. There is always a chance that your pain could be related to something serious, such as a heart attack or a blood clot in your lungs. Chest pain can also be caused by conditions that are not life-threatening. If  you have chest pain, it is very important to follow up with your health care provider. What are the causes? Causes of this condition include:  Heartburn.  Pneumonia or bronchitis.  Anxiety or stress.  Inflammation around your heart (pericarditis) or lung (pleuritis or pleurisy).  A blood clot in your lung.  A collapsed lung (pneumothorax). This can develop suddenly on its own (spontaneous pneumothorax) or from trauma to the chest.  Shingles infection (varicella-zoster virus).  Heart attack.  Damage to the bones, muscles, and cartilage that make up your chest wall. This can include:  Bruised bones due to injury.  Strained muscles or cartilage due to frequent or repeated coughing or overwork.  Fracture to one or more ribs.  Sore cartilage due to inflammation (costochondritis). What increases the risk? Risk factors for this condition may include:  Activities that increase your risk for trauma or injury to your chest.  Respiratory infections or conditions that cause frequent coughing.  Medical conditions or overeating that can cause heartburn.  Heart disease or family history of heart disease.  Conditions or health behaviors that increase your risk of developing a blood clot.  Having had chicken pox (varicella zoster). What are the signs or symptoms? Chest pain can feel like:  Burning or tingling on the surface of your chest or deep in your chest.  Crushing, pressure, aching, or squeezing pain.  Dull or sharp pain that is worse when you move, cough, or take a deep breath.  Pain that is also felt in your back, neck, shoulder, or arm, or pain that spreads to any of these areas. Your chest pain may come and go, or it may stay constant. How is this diagnosed? Lab tests or other studies may be needed to find the cause of your pain. Your health care provider may have you take a test called an ECG (electrocardiogram). An ECG records your heartbeat patterns at the time the  test is performed. You may also have other tests, such as:  Transthoracic echocardiogram (TTE). In this test, sound waves are used to create a picture of the heart structures and to look at how blood flows through your heart.  Transesophageal echocardiogram (TEE).This is a more advanced imaging test that takes images from inside your body. It allows your health care provider to see your heart in finer detail.  Cardiac monitoring. This allows your health care provider to monitor your heart rate and rhythm in real time.  Holter monitor. This is a portable device that records your heartbeat and can help to diagnose abnormal heartbeats. It allows your health care provider to track your heart activity for several days, if needed.  Stress tests. These can be done through exercise or by taking medicine that makes your heart beat more quickly.  Blood tests.  Other imaging tests. How is this treated? Treatment depends on what is causing your chest pain. Treatment may include:  Medicines. These may include:  Acid blockers for heartburn.  Anti-inflammatory medicine.  Pain medicine for inflammatory conditions.  Antibiotic medicine, if an infection is present.  Medicines to dissolve blood clots.  Medicines to treat  coronary artery disease (CAD).  Supportive care for conditions that do not require medicines. This may include:  Resting.  Applying heat or cold packs to injured areas.  Limiting activities until pain decreases. Follow these instructions at home: Medicines  If you were prescribed an antibiotic, take it as told by your health care provider. Do not stop taking the antibiotic even if you start to feel better.  Take over-the-counter and prescription medicines only as told by your health care provider. Lifestyle  Do not use any products that contain nicotine or tobacco, such as cigarettes and e-cigarettes. If you need help quitting, ask your health care provider.  Do not  drink alcohol.  Make lifestyle changes as directed by your health care provider. These may include:  Getting regular exercise. Ask your health care provider to suggest some activities that are safe for you.  Eating a heart-healthy diet. A registered dietitian can help you to learn healthy eating options.  Maintaining a healthy weight.  Managing diabetes, if necessary.  Reducing stress, such as with yoga or relaxation techniques. General instructions  Avoid any activities that bring on chest pain.  If heartburn is the cause for your chest pain, raise (elevate) the head of your bed about 6 inches (15 cm) by putting blocks under the legs. Sleeping with more pillows does not effectively relieve heartburn because it only changes the position of your head.  Keep all follow-up visits as told by your health care provider. This is important. This includes any further testing if your chest pain does not go away. Contact a health care provider if:  Your chest pain does not go away.  You have a rash with blisters on your chest.  You have a fever.  You have chills. Get help right away if:  Your chest pain is worse.  You have a cough that gets worse, or you cough up blood.  You have severe pain in your abdomen.  You have severe weakness.  You faint.  You have sudden, unexplained chest discomfort.  You have sudden, unexplained discomfort in your arms, back, neck, or jaw.  You have shortness of breath at any time.  You suddenly start to sweat, or your skin gets clammy.  You feel nauseous or you vomit.  You suddenly feel light-headed or dizzy.  Your heart begins to beat quickly, or it feels like it is skipping beats. These symptoms may represent a serious problem that is an emergency. Do not wait to see if the symptoms will go away. Get medical help right away. Call your local emergency services (911 in the [place] Do not drive yourself to the hospital.  This information is not  intended to replace advice given to you by your health care provider. Make sure you discuss any questions you have with your health care provider. Document Released: 07/25/2005 Document Revised: 07/09/2016 Document Reviewed: 07/09/2016 Elsevier Interactive Patient Education  2017 [employer].

## 2016-10-10 NOTE — Progress Notes (Signed)
Subjective:    Patient ID: Tracy Paul, female    DOB: [birth year], 65 y.o.   MRN: [id number]  HPI 66 y.o. WF with history of DM2, HTN, chol, GERD, former smoker Q 81 with 8 pack year smoking history, s/p gastric bypass presents with sore throat, HA, congestion x Saturday, with fever, chills, started very quickly.  Normal stress test 2012. No sick contacts. Did get flu shot.   Tracy Paul states while waiting out in her car, nonexertional, Tracy Paul had bilateral arm numbness, chest pain x 15-20 mins that eased up. Diffuse substernal chest pain with some in her back, denies dizziness, nausea, sweating, SOB. No symptoms with exertion over last 2-3 months, has history of neck pain, will occ get numbness in her hands.   Blood pressure 130/70, pulse 75, resp. rate 14, height 5\' 3"  (1.6 m), weight 161 lb (73 kg), SpO2 97 %.  Medications Current Outpatient Prescriptions on File Prior to Visit  Medication Sig  . acyclovir (ZOVIRAX) 400 MG tablet Take 1 tablet (400 mg total) by mouth daily.  Marland Kitchen acyclovir ointment (ZOVIRAX) 5 % Apply thin layer to affected area daily  . aspirin 81 MG tablet Take 81 mg by mouth daily.  . Biotin 10 MG TABS Take 10,000 each by mouth daily.  . Calcium Citrate-Vitamin D (CALCIUM CITRATE + D3 PO) Take 600 mg by mouth daily.   . Eluxadoline (VIBERZI) 75 MG TABS Take 75 mg by mouth 2 (two) times daily.  . Ferrous Sulfate (SLOW RELEASE IRON PO) Take by mouth daily.  . fish oil-omega-3 fatty acids 1000 MG capsule Take 1 g by mouth daily.  . fluticasone (FLONASE) 50 MCG/ACT nasal spray Place 2 sprays into both nostrils daily.  . hydrochlorothiazide (HYDRODIURIL) 25 MG tablet   . levothyroxine (SYNTHROID, LEVOTHROID) 75 MCG tablet TAKE ONE TABLET BY MOUTH ONCE DAILY  . lidocaine (XYLOCAINE) 2 % solution Use as directed 20 mLs in the mouth or throat as needed for mouth pain.  . Magnesium 250 MG TABS Take by mouth daily.  Marland Kitchen neomycin-polymyxin-hydrocortisone (CORTISPORIN) otic solution  Apply one to two drops to toe after soaking twice daily.  . potassium chloride (K-DUR) 10 MEQ tablet TAKE ONE TABLET BY MOUTH TWICE DAILY  . pravastatin (PRAVACHOL) 40 MG tablet TAKE ONE TABLET BY MOUTH ONCE DAILY  . ranitidine (ZANTAC) 300 MG tablet Take 1 to 2 tablets daily for heartburn & reflux  . rOPINIRole (REQUIP) 0.5 MG tablet START BY TAKING 1/2 TABLET AT NIGHT, CAN INCREASE TO 1 TABLET, THEN MAY INCREASE UP TO 3 TABLETS AT NIGHT  . vitamin B-12 (CYANOCOBALAMIN) 1000 MCG tablet Take 1,000 mcg by mouth daily.   . Vitamin D, Ergocalciferol, (DRISDOL) 50000 units CAPS capsule TAKE ONE CAPSULE BY MOUTH ONCE DAILY   No current facility-administered medications on file prior to visit.     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  Review of Systems  Constitutional: Positive for fatigue. Negative for chills, diaphoresis and fever.  HENT: Positive for congestion, postnasal drip, sinus pressure, sneezing and sore throat. Negative for ear pain and trouble swallowing.   Respiratory: Positive for cough. Negative for choking, chest tightness, shortness of breath, wheezing and stridor.   Cardiovascular: Positive for chest pain. Negative  for palpitations and leg swelling.  Gastrointestinal: Negative.   Genitourinary: Negative.   Musculoskeletal: Positive for back pain and myalgias. Negative for arthralgias, gait problem, joint swelling, neck pain and neck stiffness.  Neurological: Positive for headaches. Negative for speech difficulty, weakness, light-headedness and numbness.  Psychiatric/Behavioral: Negative.        Objective:   Physical Exam  Constitutional: Tracy Paul is  oriented to person, place, and time. Tracy Paul appears well-developed and well-nourished.  HENT:  Head: Normocephalic and atraumatic.  Right Ear: External ear normal.  Left Ear: External ear normal.  Nose: Right sinus exhibits maxillary sinus tenderness. Left sinus exhibits maxillary sinus tenderness.  Mouth/Throat: There is trismus in the jaw. Posterior oropharyngeal erythema present. No oropharyngeal exudate, posterior oropharyngeal edema or tonsillar abscesses.  Eyes: Conjunctivae and EOM are normal. Pupils are equal, round, and reactive to light.  Neck: Normal range of motion. Neck supple.  Cardiovascular: Regular rhythm, normal heart sounds and normal pulses.  Bradycardia present.  PMI is not displaced.   No murmur heard. Pulmonary/Chest: Effort normal and breath sounds normal. Tracy Paul has no wheezes. Tracy Paul has no rhonchi. Tracy Paul has no rales.  Abdominal: Soft. Bowel sounds are normal. There is tenderness (epigastric) in the epigastric area. There is no rigidity, no rebound, no guarding, no tenderness at McBurney's point and negative Murphy's sign.  Musculoskeletal: Normal range of motion.  Lymphadenopathy:    Tracy Paul has no cervical adenopathy.  Neurological: Tracy Paul is alert and oriented to person, place, and time. Tracy Paul has normal strength and normal reflexes. Tracy Paul displays normal reflexes. No cranial nerve deficit or sensory deficit. Tracy Paul displays a negative Romberg sign.  Skin: Skin is warm and dry. No rash noted. Tracy Paul is not diaphoretic.       Assessment & Plan:  1. Acute maxillary sinusitis, recurrence not specified Will get CBC to see if it is flu, hold zpak, get on allergy pill, flonase, prednisone, if not better start zpak - predniSONE (DELTASONE) 20 MG tablet; 2 tablets daily for 3 days, 1 tablet daily for 4 days.  Dispense: 10 tablet; Refill: 0 - azithromycin (ZITHROMAX) 250 MG tablet; Take 2 tablets (500 mg) on  Day 1,  followed by 1 tablet (250 mg) once daily on Days 2 through 5.  Dispense: 6 each;  Refill: 1 - benzonatate (TESSALON PERLES) 100 MG capsule; Take 2 capsules (200 mg total) by mouth 3 (three) times daily as needed for cough (Max: 600mg  per day).  Dispense: 60 capsule; Refill: 0 - dexamethasone (DECADRON) injection 10 mg; Inject 1 mL (10 mg total) into the muscle once.  2. Chest pain, unspecified type Atypical, nonexertional, no accompaniments, no changes EKG, low risk PE will get ddimer, check for pancreatitis/GERD/ulcers with epigastric pain- start prilosec, samples of carafate given, will go to Er if any more CP or any accompaniments with it - CBC with Differential/Platelet - Hepatic function panel - Amylase - D-dimer, quantitative (not at [facility]) - EKG 12-Lead  3. Neck pain Has history, likely cause of arm numbness - DG Cervical Spine Complete; Future

## 2016-10-11 LAB — HEPATIC FUNCTION PANEL
ALT: 18 U/L (ref 6–29)
AST: 22 U/L (ref 10–35)
Albumin: 3.7 g/dL (ref 3.6–5.1)
Alkaline Phosphatase: 106 U/L (ref 33–130)
BILIRUBIN DIRECT: 0.2 mg/dL (ref <=0.2)
BILIRUBIN INDIRECT: 0.9 mg/dL (ref 0.2–1.2)
Total Bilirubin: 1.1 mg/dL (ref 0.2–1.2)
Total Protein: 6.2 g/dL (ref 6.1–8.1)

## 2016-10-11 LAB — AMYLASE: AMYLASE: 38 U/L (ref 0–105)

## 2016-10-16 ENCOUNTER — Other Ambulatory Visit: Payer: Self-pay | Admitting: Physician Assistant

## 2016-10-16 ENCOUNTER — Encounter: Payer: Self-pay | Admitting: Physician Assistant

## 2016-10-16 DIAGNOSIS — K580 Irritable bowel syndrome with diarrhea: Secondary | ICD-10-CM

## 2016-10-16 MED ORDER — ELUXADOLINE 75 MG PO TABS: 75.0000 mg | ORAL_TABLET | Freq: Two times a day (BID) | ORAL | 3 refills | Status: DC

## 2016-10-16 NOTE — Progress Notes (Signed)
VIBERZI tablet was call into pharmacy @4 :25pm

## 2016-12-19 ENCOUNTER — Ambulatory Visit: Payer: Self-pay | Admitting: Internal Medicine

## 2016-12-25 ENCOUNTER — Other Ambulatory Visit: Payer: Self-pay | Admitting: Internal Medicine

## 2016-12-25 ENCOUNTER — Other Ambulatory Visit: Payer: Self-pay | Admitting: Physician Assistant

## 2016-12-25 ENCOUNTER — Encounter: Payer: Self-pay | Admitting: Internal Medicine

## 2016-12-25 ENCOUNTER — Ambulatory Visit (INDEPENDENT_AMBULATORY_CARE_PROVIDER_SITE_OTHER): Payer: Federal, State, Local not specified - PPO | Admitting: Internal Medicine

## 2016-12-25 VITALS — BP 134/72 | HR 56 | Temp 97.5°F | Resp 16 | Ht 63.0 in | Wt 161.6 lb

## 2016-12-25 DIAGNOSIS — E1121 Type 2 diabetes mellitus with diabetic nephropathy: Secondary | ICD-10-CM

## 2016-12-25 DIAGNOSIS — E559 Vitamin D deficiency, unspecified: Secondary | ICD-10-CM

## 2016-12-25 DIAGNOSIS — Z79899 Other long term (current) drug therapy: Secondary | ICD-10-CM | POA: Diagnosis not present

## 2016-12-25 DIAGNOSIS — I10 Essential (primary) hypertension: Secondary | ICD-10-CM | POA: Diagnosis not present

## 2016-12-25 DIAGNOSIS — E782 Mixed hyperlipidemia: Secondary | ICD-10-CM | POA: Diagnosis not present

## 2016-12-25 DIAGNOSIS — E039 Hypothyroidism, unspecified: Secondary | ICD-10-CM

## 2016-12-25 LAB — BASIC METABOLIC PANEL WITH GFR
BUN: 11 mg/dL (ref 7–25)
CALCIUM: 8.7 mg/dL (ref 8.6–10.4)
CO2: 24 mmol/L (ref 20–31)
Chloride: 106 mmol/L (ref 98–110)
Creat: 0.88 mg/dL (ref 0.50–0.99)
GFR, EST AFRICAN AMERICAN: 80 mL/min (ref >=60)
GFR, Est Non African American: 70 mL/min (ref >=60)
Glucose, Bld: 85 mg/dL (ref 65–99)
POTASSIUM: 4.2 mmol/L (ref 3.5–5.3)
Sodium: 140 mmol/L (ref 135–146)

## 2016-12-25 LAB — CBC WITH DIFFERENTIAL/PLATELET
BASOS PCT: 1 %
Basophils Absolute: 47 cells/uL (ref 0–200)
Eosinophils Absolute: 282 cells/uL (ref 15–500)
Eosinophils Relative: 6 %
HEMATOCRIT: 35.2 % (ref 35.0–45.0)
Hemoglobin: 11.4 g/dL — ABNORMAL LOW (ref 11.7–15.5)
LYMPHS PCT: 52 %
Lymphs Abs: 2444 cells/uL (ref 850–3900)
MCH: 30 pg (ref 27.0–33.0)
MCHC: 32.4 g/dL (ref 32.0–36.0)
MCV: 92.6 fL (ref 80.0–100.0)
MONOS PCT: 11 %
MPV: 10.4 fL (ref 7.5–12.5)
Monocytes Absolute: 517 cells/uL (ref 200–950)
NEUTROS PCT: 30 %
Neutro Abs: 1410 cells/uL — ABNORMAL LOW (ref 1500–7800)
PLATELETS: 255 10*3/uL (ref 140–400)
RBC: 3.8 MIL/uL (ref 3.80–5.10)
RDW: 14 % (ref 11.0–15.0)
WBC: 4.7 10*3/uL (ref 3.8–10.8)

## 2016-12-25 LAB — HEPATIC FUNCTION PANEL
ALBUMIN: 3.6 g/dL (ref 3.6–5.1)
ALT: 18 U/L (ref 6–29)
AST: 22 U/L (ref 10–35)
Alkaline Phosphatase: 95 U/L (ref 33–130)
BILIRUBIN DIRECT: 0.3 mg/dL — AB (ref <=0.2)
Indirect Bilirubin: 1.2 mg/dL (ref 0.2–1.2)
TOTAL PROTEIN: 6.1 g/dL (ref 6.1–8.1)
Total Bilirubin: 1.5 mg/dL — ABNORMAL HIGH (ref 0.2–1.2)

## 2016-12-25 LAB — LIPID PANEL
CHOL/HDL RATIO: 1.9 ratio (ref <5.0)
CHOLESTEROL: 133 mg/dL (ref <200)
HDL: 71 mg/dL (ref >50)
LDL Cholesterol: 50 mg/dL (ref <100)
Triglycerides: 58 mg/dL (ref <150)
VLDL: 12 mg/dL (ref <30)

## 2016-12-25 NOTE — Progress Notes (Signed)
[facility] [name], M.D.        Tracy Paul. Tracy Paul, P.A.-C       Tracy Paul, P.A.-C  [facility]                [address] [place] [id number] Telephone [phone] Telefax [phone] ______________________________________________________________________     This very nice 66 y.o. MWF presents for 6 month follow up with Hypertension, Hyperlipidemia, Pre-Diabetes and Vitamin D Deficiency. Other problems include GERD controlled diet and meds.      Patient is treated for HTN (1995) & BP has been controlled at home. Patient had normal Heart Caths x 2 (1995 and 2000).  Today's BP is at goal -  134/72. Patient has had no complaints of any cardiac type chest pain, palpitations, dyspnea/orthopnea/PND, dizziness, claudication, or dependent edema.     Hyperlipidemia is controlled with diet & meds. Patient denies myalgias or other med SE's. 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 In Dec 2013 she underwent Lap Band Roux-en-Y Gastric Bipass and lost about 120# with A1c's normalizing. She denies  symptoms of reactive hypoglycemia, diabetic polys, paresthesias or visual blurring.  Last A1c was at goal: Lab Results  Component Value Date   HGBA1C 5.5 05/23/2016      Patient also is on thyroid replacement (2008). Further, the patient also has history of Vitamin D Deficiency ("28" in 2008)  and supplements vitamin D without any suspected side-effects. Last vitamin D was at goal:  Lab Results  Component Value Date   VD25OH 74 09/24/2016   Current Outpatient Prescriptions on File Prior to Visit  Medication Sig  . acyclovir (ZOVIRAX) 400 MG tablet Take 1 tablet (400 mg total) by mouth  daily.  Marland Kitchen acyclovir ointment (ZOVIRAX) 5 % Apply thin layer to affected area daily  . aspirin 81 MG tablet Take 81 mg by mouth daily.  . Biotin 10 MG TABS Take 10,000 each by mouth daily.  . Calcium Citrate-Vitamin D (CALCIUM CITRATE + D3 PO) Take 600 mg by mouth daily.   . Ferrous Sulfate (SLOW RELEASE IRON PO) Take by mouth daily.  . fish oil-omega-3 fatty acids 1000 MG capsule Take 1 g by mouth daily.  . fluticasone (FLONASE) 50 MCG/ACT nasal spray Place 2 sprays into both nostrils daily.  Marland Kitchen levothyroxine (SYNTHROID, LEVOTHROID) 75 MCG tablet TAKE ONE TABLET BY MOUTH ONCE DAILY  . Magnesium 250 MG TABS Take by mouth daily.  . potassium chloride (K-DUR) 10 MEQ tablet TAKE ONE TABLET BY MOUTH TWICE DAILY  . pravastatin (PRAVACHOL) 40 MG tablet TAKE ONE TABLET BY MOUTH ONCE DAILY  . ranitidine (ZANTAC) 300 MG tablet Take 1 to 2 tablets daily for heartburn & reflux  . vitamin B-12 (CYANOCOBALAMIN) 1000 MCG tablet Take 1,000 mcg by mouth daily.    No current facility-administered medications on file prior to visit.    Allergies  Allergen Reactions  . Codeine Other (See Comments)    "puts me out of it".  Dysphoria.  Patient tolerates Tramadol without complications.  . Nabumetone     GI upset   PMHx:  Past Medical History:  Diagnosis Date  . Allergic rhinitis   . Arthritis   . Cholecystitis, chronic   . Dysrhythmia    Hx bradycardia - hx normal cardiac cath x2 / ECHO 2009 EF 60% LAE MYOVIEW 3/12 EF 60% NO ISCHEMIA  . Hyperlipidemia   . Hypertension    no treatment after 120 lb weight loss  . Hypothyroidism   . Prediabetes   . Thyroid nodule    Immunization History  Administered Date(s) Administered  . Influenza Split 08/25/2013, 09/02/2014, 08/10/2015  . Influenza,inj,quad, With Preservative 08/10/2016  . PPD Test 02/23/2014, 05/23/2016  . Pneumococcal Polysaccharide-23 10/18/2008  . Td 08/08/2006  . Tdap 05/23/2016  . Zoster 10/15/2012   Past Surgical History:   Procedure Laterality Date  . Bilateral carpal tunnel surgery    . BREATH TEK H PYLORI  07/01/2012   Procedure: BREATH TEK H PYLORI;  Surgeon: Edward Jolly, MD;  Location: [facility];  Service: General;  Laterality: N/A;  . CHOLECYSTECTOMY N/A 11/27/2013   Procedure: LAPAROSCOPIC CHOLECYSTECTOMY WITH INTRAOPERATIVE CHOLANGIOGRAM;  Surgeon: Pedro Earls, MD;  Location: [facility];  Service: General;  Laterality: N/A;  . GASTRIC ROUX-EN-Y N/A 12/16/2012   Procedure: LAPAROSCOPIC ROUX-EN-Y GASTRIC BYPASS WITH UPPER ENDOSCOPY;  Surgeon: Pedro Earls, MD;  Location: [facility];  Service: General;  Laterality: N/A;  Gastric Bypass  . NASAL SINUS SURGERY    . REPLACEMENT TOTAL KNEE BILATERAL     bilat  . TUBAL LIGATION     FHx:    Reviewed / unchanged  SHx:    Reviewed / unchanged  Systems Review:  Constitutional: Denies fever, chills, wt changes, headaches, insomnia, fatigue, night sweats, change in appetite. Eyes: Denies redness, blurred vision, diplopia, discharge, itchy, watery eyes.  ENT: Denies discharge, congestion, post nasal drip, epistaxis, sore throat, earache, hearing loss, dental pain, tinnitus, vertigo, sinus pain, snoring.  CV: Denies chest pain, palpitations, irregular heartbeat, syncope, dyspnea, diaphoresis, orthopnea, PND, claudication or edema. Respiratory: denies cough, dyspnea, DOE, pleurisy, hoarseness, laryngitis, wheezing.  Gastrointestinal: Denies dysphagia, odynophagia, heartburn, reflux, water brash, abdominal pain or cramps, nausea, vomiting, bloating, diarrhea, constipation, hematemesis, melena, hematochezia  or hemorrhoids. Genitourinary: Denies dysuria, frequency, urgency, nocturia, hesitancy, discharge, hematuria or flank pain. Musculoskeletal: Denies arthralgias, myalgias, stiffness, jt. swelling, pain, limping or strain/sprain.  Skin: Denies pruritus, rash, hives, warts, acne, eczema or change in skin lesion(s). Neuro: No weakness, tremor, incoordination,  spasms, paresthesia or pain. Psychiatric: Denies confusion, memory loss or sensory loss. Endo: Denies change in weight, skin or hair change.  Heme/Lymph: No excessive bleeding, bruising or enlarged lymph nodes.  Physical Exam  BP 134/72   Pulse (!) 56   Temp 97.5 F (36.4 C)   Resp 16   Ht 5\' 3"  (1.6 m)   Wt 161 lb 9.6 oz (73.3 kg)   LMP  (LMP Unknown)   BMI 28.63 kg/m   Appears well nourished and in no distress.  Eyes: PERRLA, EOMs, conjunctiva no swelling or erythema. Sinuses: No frontal/maxillary tenderness ENT/Mouth: EAC's clear, TM's nl w/o erythema, bulging. Nares clear w/o erythema, swelling, exudates. Oropharynx clear without erythema or exudates. Oral hygiene is good. Tongue normal, non obstructing. Hearing intact.  Neck: Supple. Thyroid nl. Car 2+/2+ without bruits, nodes or JVD. Chest: Respirations nl with BS clear & equal w/o rales, rhonchi, wheezing or stridor.  Cor: Heart sounds normal w/ regular rate and rhythm without sig. murmurs, gallops, clicks, or rubs. Peripheral pulses normal and equal  without edema.  Abdomen:  Soft & bowel sounds normal. Non-tender w/o guarding, rebound, hernias, masses, or organomegaly.  Lymphatics: Unremarkable.  Musculoskeletal: Full ROM all peripheral extremities, joint stability, 5/5 strength, and normal gait.  Skin: Warm, dry without exposed rashes, lesions or ecchymosis apparent.  Neuro: Cranial nerves intact, reflexes equal bilaterally. Sensory-motor testing grossly intact. Tendon reflexes grossly intact.  Pysch: Alert & oriented x 3.  Insight and judgement nl & appropriate. No ideations.  Assessment and Plan:  1. Essential hypertension  - Continue medication, monitor blood pressure at home.  - Continue DASH diet. Reminder to go to the ER if any CP,  SOB, nausea, dizziness, severe HA, changes vision/speech,  left arm numbness and tingling and jaw pain. - CBC with Differential/Platelet - BASIC METABOLIC PANEL WITH GFR -  Magnesium - TSH  2. Mixed hyperlipidemia  - Continue diet/meds, exercise,& lifestyle modifications.  - Continue monitor periodic cholesterol/liver & renal functions  - Hepatic function panel - Lipid panel - TSH  3. Type 2 diabetes mellitus with diabetic nephropathy,(HCC)  - Continue diet, exercise, lifestyle modifications.  - Monitor appropriate labs. - Hemoglobin A1c - Insulin, random  4. Vitamin D deficiency  - Continue supplementation. - VITAMIN D 25 Hydroxy   5. Hypothyroidism  - TSH  6. Medication management  - CBC with Differential/Platelet - BASIC METABOLIC PANEL WITH GFR - Hepatic function panel - Magnesium - Lipid panel - TSH - Hemoglobin A1c - Insulin, random - VITAMIN D 25 Hydroxy        Recommended regular exercise, BP monitoring, weight control, and discussed med and SE's. Recommended labs to assess and monitor clinical status. Further disposition pending results of labs. Over 30 minutes of exam, counseling, chart review was performed

## 2016-12-25 NOTE — Patient Instructions (Signed)

## 2016-12-26 LAB — MAGNESIUM: MAGNESIUM: 1.9 mg/dL (ref 1.5–2.5)

## 2016-12-26 LAB — HEMOGLOBIN A1C
HEMOGLOBIN A1C: 5.3 % (ref <5.7)
MEAN PLASMA GLUCOSE: 105 mg/dL

## 2016-12-26 LAB — INSULIN, RANDOM: INSULIN: 2.3 u[IU]/mL (ref 2.0–19.6)

## 2016-12-26 LAB — TSH: TSH: 1.44 m[IU]/L

## 2016-12-26 LAB — VITAMIN D 25 HYDROXY (VIT D DEFICIENCY, FRACTURES): VIT D 25 HYDROXY: 67 ng/mL (ref 30–100)

## 2017-01-14 ENCOUNTER — Encounter: Payer: Self-pay | Admitting: Internal Medicine

## 2017-01-14 ENCOUNTER — Ambulatory Visit (INDEPENDENT_AMBULATORY_CARE_PROVIDER_SITE_OTHER): Payer: Federal, State, Local not specified - PPO | Admitting: Internal Medicine

## 2017-01-14 VITALS — BP 148/84 | HR 56 | Temp 97.8°F | Resp 16 | Ht 63.0 in | Wt 166.0 lb

## 2017-01-14 DIAGNOSIS — M25512 Pain in left shoulder: Secondary | ICD-10-CM | POA: Diagnosis not present

## 2017-01-14 MED ORDER — MELOXICAM 15 MG PO TABS: 15.0000 mg | ORAL_TABLET | Freq: Every day | ORAL | 0 refills | Status: DC

## 2017-01-14 MED ORDER — CYCLOBENZAPRINE HCL 10 MG PO TABS: 10.0000 mg | ORAL_TABLET | Freq: Three times a day (TID) | ORAL | 1 refills | Status: DC | PRN

## 2017-01-14 NOTE — Progress Notes (Signed)
Assessment and Plan:   1. Acute pain of left shoulder -likely impingement syndrome of the shoulder with possible spasm from strain of surrounding supporting muscles.  No indication for xray at this time.  Will try oral antiinflammatory medications and flexeril.  If no relief will need to see ortho for further evaluation and possible injection of the shoulder.  Patient aware that overhead lifting will likely worsen the pain.  She is to use heat.  She is aware that flexeril is sedating and that it should not be taken 4 hours prior to driving.  She will call office if no relief.   - cyclobenzaprine (FLEXERIL) 10 MG tablet; Take 1 tablet (10 mg total) by mouth 3 (three) times daily as needed for muscle spasms.  Dispense: 90 tablet; Refill: 1 - meloxicam (MOBIC) 15 MG tablet; Take 1 tablet (15 mg total) by mouth daily.  Dispense: 90 tablet; Refill: 0    HPI 66 y.o.female presents for left scapular pain bilaterally that radiates down the left arm.  She reports that this has been happening for the last 2 weeks.  She reports that it is a deep constant aching.  She reports that the pain was bad enough that she had to take some pain medication that she had to take it.  The prescription was percocet.  She reports no injury that she can think of.  She has been lifting stuff and doing overhead lifting.  She reports that the pain is worse on the left side.  She is not having any swelling.  She reports that she is not having tingling or numbness.  No weakness.  She reports that the pain wakes her from sleep.  She has never had shoulder surgery or neck surgeries.   Past Medical History:  Diagnosis Date  . Allergic rhinitis   . Arthritis   . Cholecystitis, chronic   . Dysrhythmia    Hx bradycardia - hx normal cardiac cath x2 / ECHO 2009 EF 60% LAE MYOVIEW 3/12 EF 60% NO ISCHEMIA  . Hyperlipidemia   . Hypertension    no treatment after 120 lb weight loss  . Hypothyroidism   . Prediabetes   . Thyroid nodule       Allergies  Allergen Reactions  . Codeine Other (See Comments)    "puts me out of it".  Dysphoria.  Patient tolerates Tramadol without complications.  . Nabumetone     GI upset      Current Outpatient Prescriptions on File Prior to Visit  Medication Sig Dispense Refill  . acyclovir (ZOVIRAX) 400 MG tablet Take 1 tablet (400 mg total) by mouth daily. 90 tablet 1  . acyclovir ointment (ZOVIRAX) 5 % Apply thin layer to affected area daily 15 g 0  . Ascorbic Acid (VITAMIN C) 500 MG CAPS Take by mouth daily.    Marland Kitchen aspirin 81 MG tablet Take 81 mg by mouth daily.    . Biotin 10 MG TABS Take 10,000 each by mouth daily.    . Calcium Citrate-Vitamin D (CALCIUM CITRATE + D3 PO) Take 600 mg by mouth daily.     . diphenhydramine-acetaminophen (TYLENOL PM) 25-500 MG TABS tablet Take 1 tablet by mouth at bedtime as needed.    . Ferrous Sulfate (SLOW RELEASE IRON PO) Take by mouth daily.    . fish oil-omega-3 fatty acids 1000 MG capsule Take 1 g by mouth daily.    . fluticasone (FLONASE) 50 MCG/ACT nasal spray Place 2 sprays into both nostrils daily. [place]  g 0  . levothyroxine (SYNTHROID, LEVOTHROID) 75 MCG tablet TAKE ONE TABLET BY MOUTH ONCE DAILY 90 tablet 1  . Magnesium 250 MG TABS Take by mouth daily.    . Melatonin 10 MG TABS Take 1 tablet by mouth at bedtime.    Marland Kitchen OVER THE COUNTER MEDICATION Takes Allegra 25 mg at bedtime.    . potassium chloride (K-DUR) 10 MEQ tablet TAKE ONE TABLET BY MOUTH TWICE DAILY 120 tablet 1  . pravastatin (PRAVACHOL) 40 MG tablet TAKE ONE TABLET BY MOUTH ONCE DAILY 90 tablet 2  . ranitidine (ZANTAC) 300 MG tablet Take 1 to 2 tablets daily for heartburn & reflux 180 tablet 1  . rOPINIRole (REQUIP) 0.5 MG tablet START BY TAKING ONE-HALF TABLET BY MOUTH AT NIGHT. CAN INCREASE TO TAKE ONE TABLET. THEN MAY INCREASE UP TO FOUR TABLETS AT NIGHT 90 tablet 0  . vitamin B-12 (CYANOCOBALAMIN) 1000 MCG tablet Take 1,000 mcg by mouth daily.     . Vitamin D, Ergocalciferol,  (DRISDOL) 50000 units CAPS capsule TAKE ONE CAPSULE BY MOUTH ONCE DAILY 90 capsule 1   No current facility-administered medications on file prior to visit.     ROS: all negative except above.   Physical Exam: Filed Weights   01/14/17 1048  Weight: 166 lb (75.3 kg)   BP (!) 148/84   Pulse (!) 56   Temp 97.8 F (36.6 C) (Temporal)   Resp 16   Ht 5\' 3"  (1.6 m)   Wt 166 lb (75.3 kg)   LMP  (LMP Unknown)   BMI 29.41 kg/m  General Appearance: Well developed well nourished, non-toxic appearing in no apparent distress. Eyes: PERRLA, EOMs, conjunctiva w/ no swelling or erythema or discharge Sinuses: No Frontal/maxillary tenderness ENT/Mouth: Ear canals clear without swelling or erythema.  TM's normal bilaterally with no retractions, bulging, or loss of landmarks.   Neck: Supple, thyroid normal, no notable JVD  Respiratory: Respiratory effort normal, Clear breath sounds anteriorly and posteriorly bilaterally without rales, rhonchi, wheezing or stridor. No retractions or accessory muscle usage. Cardio: RRR with no MRGs.   Abdomen: Soft, + BS.  Non tender, no guarding, rebound, hernias, masses.  Musculoskeletal: Patient rises slowly from sitting to standing.  They walk without an antalgic gait.  There is no evidence of erythema, ecchymosis, or gross deformity.  There is no tenderness to palpation over the bony cervical or thoracic spine.  There is palpable spasm over the bilateral trapezius.  There is tenderness to palpation on the left periscapular area wihtout visible winging..  Active ROM is full.  There is pain with internal rotation of the shoulder.  Sensation to light touch is intact over all extremities.  Bilateral upper extremities neurovascularly intact.  Left arm with positive hawkins kennedy, positive speeds test, and positive empty can for weakness.  Negative belly press and negative lift off test.  Full ROM, 5/5 strength, normal gait.  Skin: Warm, dry without rashes  Neuro: Awake and  oriented X 3, Cranial nerves intact. Normal muscle tone, no cerebellar symptoms. Sensation intact.  Psych: normal affect, Insight and Judgment appropriate.     [name], PA-C 11:15 AM [facility]

## 2017-01-14 NOTE — Patient Instructions (Signed)
Shoulder Impingement Syndrome Shoulder impingement syndrome is a condition that causes pain when connective tissues (tendons) surrounding the shoulder joint become pinched. These tendons are part of the group of muscles and tissues that help to stabilize the shoulder (rotator cuff). Beneath the rotator cuff is a fluid-filled sac (bursa) that allows the muscles and tendons to glide smoothly. The bursa may become swollen or irritated (bursitis). Bursitis, swelling in the rotator cuff tendons, or both conditions can decrease how much space is under a bone in the shoulder joint (acromion), resulting in impingement. What are the causes? Shoulder impingement syndrome can be caused by bursitis or swelling of the rotator cuff tendons, which may result from:  Repetitive overhead arm movements.  Falling onto the shoulder.  Weakness in the shoulder muscles. What increases the risk? You may be more likely to develop this condition if you are an athlete who participates in:  Sports that involve throwing, such as baseball.  Tennis.  Swimming.  Volleyball. Some people are also more likely to develop impingement syndrome because of the shape of their acromion bone. What are the signs or symptoms? The main symptom of this condition is pain on the front or side of the shoulder. Pain may:  Get worse when lifting or raising the arm.  Get worse at night.  Wake you up from sleeping.  Feel sharp when the shoulder is moved, and then fade to an ache. Other signs and symptoms may include:  Tenderness.  Stiffness.  Inability to raise the arm above shoulder level or behind the body.  Weakness. How is this diagnosed? This condition may be diagnosed based on:  Your symptoms.  Your medical history.  A physical exam.  Imaging tests, such as:  X-rays.  MRI.  Ultrasound. How is this treated? Treatment for this condition may include:  Resting your shoulder and avoiding all activities that  cause pain or put stress on the shoulder.  Icing your shoulder.  NSAIDs to help reduce pain and swelling.  One or more injections of medicines to numb the area and reduce inflammation.  Physical therapy.  Surgery. This may be needed if nonsurgical treatments have not helped. Surgery may involve repairing the rotator cuff, reshaping the acromion, or removing the bursa. Follow these instructions at home: Managing pain, stiffness, and swelling   If directed, apply ice to the injured area.  Put ice in a plastic bag.  Place a towel between your skin and the bag.  Leave the ice on for 20 minutes, 2-3 times a day. Activity   Rest and return to your normal activities as told by your health care provider. Ask your health care provider what activities are safe for you.  Do exercises as told by your health care provider. General instructions   Do not use any tobacco products, including cigarettes, chewing tobacco, or e-cigarettes. Tobacco can delay healing. If you need help quitting, ask your health care provider.  Ask your health care provider when it is safe for you to drive.  Take over-the-counter and prescription medicines only as told by your health care provider.  Keep all follow-up visits as told by your health care provider. This is important. How is this prevented?  Give your body time to rest between periods of activity.  Be safe and responsible while being active to avoid falls.  Maintain physical fitness, including strength and flexibility. Contact a health care provider if:  Your symptoms have not improved after 1-2 months of treatment and rest.  You   cannot lift your arm away from your body. This information is not intended to replace advice given to you by your health care provider. Make sure you discuss any questions you have with your health care provider. Document Released: 10/15/2005 Document Revised: 06/21/2016 Document Reviewed: 09/17/2015 Elsevier  Interactive Patient Education  2017 [employer]  

## 2017-01-24 ENCOUNTER — Encounter: Payer: Self-pay | Admitting: Internal Medicine

## 2017-01-24 ENCOUNTER — Ambulatory Visit (INDEPENDENT_AMBULATORY_CARE_PROVIDER_SITE_OTHER): Payer: Federal, State, Local not specified - PPO | Admitting: Internal Medicine

## 2017-01-24 DIAGNOSIS — J069 Acute upper respiratory infection, unspecified: Secondary | ICD-10-CM | POA: Diagnosis not present

## 2017-01-24 MED ORDER — PROMETHAZINE-DM 6.25-15 MG/5ML PO SYRP: ORAL_SOLUTION | ORAL | 1 refills | Status: DC

## 2017-01-24 MED ORDER — PREDNISONE 20 MG PO TABS: ORAL_TABLET | ORAL | 0 refills | Status: DC

## 2017-01-24 MED ORDER — DOXYCYCLINE HYCLATE 100 MG PO CAPS: 100.0000 mg | ORAL_CAPSULE | Freq: Two times a day (BID) | ORAL | 0 refills | Status: DC

## 2017-01-24 MED ORDER — FLUTICASONE PROPIONATE 50 MCG/ACT NA SUSP: 2.0000 | Freq: Every day | NASAL | 0 refills | Status: DC

## 2017-01-24 NOTE — Progress Notes (Signed)
HPI  Patient presents to the office for evaluation of cough.  It has been going on for 10 days.  Patient reports no coughing.  She does have to clear her throat a lot.  They also endorse change in voice, postnasal drip and sinus pressure, ear pain on the right, sore throat, mild chills.  .  They have tried mucinex and nasal saline .  They report that nothing has worked.  They denies other sick contacts.  Review of Systems  Constitutional: Positive for malaise/fatigue. Negative for chills and fever.  HENT: Positive for congestion, ear pain, hearing loss and sore throat.   Respiratory: Positive for cough. Negative for sputum production, shortness of breath and wheezing.   Cardiovascular: Negative for chest pain, palpitations and leg swelling.  Neurological: Positive for headaches.    PE:  Vitals:   01/24/17 1036  BP: 136/68  Pulse: 60  Resp: 16  Temp: 98 F (36.7 C)    General:  Alert and non-toxic, WDWN, NAD HEENT: NCAT, PERLA, EOM normal, no occular discharge or erythema.  Nasal mucosal edema with sinus tenderness to palpation.  Oropharynx clear with minimal oropharyngeal edema and erythema.  Mucous membranes moist and pink. Neck:  Cervical adenopathy Chest:  RRR no MRGs.  Lungs clear to auscultation A&P with no wheezes rhonchi or rales.   Abdomen: +BS x 4 quadrants, soft, non-tender, no guarding, rigidity, or rebound. Skin: warm and dry no rash Neuro: A&Ox4, CN II-XII grossly intact  Assessment and Plan:   1. Acute URI  - fluticasone (FLONASE) 50 MCG/ACT nasal spray; Place 2 sprays into both nostrils daily.  Dispense: 16 g; Refill: 0 - doxycycline (VIBRAMYCIN) 100 MG capsule; Take 1 capsule (100 mg total) by mouth 2 (two) times daily. One po bid x 7 days  Dispense: 14 capsule; Refill: 0 - predniSONE (DELTASONE) 20 MG tablet; 3 tabs po daily x 3 days, then 2 tabs x 3 days, then 1.5 tabs x 3 days, then 1 tab x 3 days, then 0.5 tabs x 3 days  Dispense: 27 tablet; Refill: 0 -  promethazine-dextromethorphan (PROMETHAZINE-DM) 6.25-15 MG/5ML syrup; Take 5-10 ML PO q8hrs prn for congestion and cough  Dispense: 180 mL; Refill: 1 -cont nasal saline -cont increased fluid intake -cont daily antihistamine -return to office if no relief -recommended continuing flonase through May to cover for allergy season

## 2017-02-27 ENCOUNTER — Other Ambulatory Visit: Payer: Self-pay | Admitting: Physician Assistant

## 2017-04-04 ENCOUNTER — Ambulatory Visit: Payer: Self-pay | Admitting: Internal Medicine

## 2017-04-15 NOTE — Progress Notes (Signed)
Assessment and Plan:  Essential hypertension - continue medications, DASH diet, exercise and monitor at home. Call if greater than 130/80.  - CBC with Differential/Platelet - BASIC METABOLIC PANEL WITH GFR - Hepatic function panel - TSH  Hyperlipidemia, unspecified hyperlipidemia type -continue medications, check lipids, decrease fatty foods, increase activity.  - Lipid panel   Vitamin D deficiency - VITAMIN D 25 Hydroxy (Vit-D Deficiency, Fractures)   Medication management - Magnesium   Continue diet and meds as discussed. Further disposition pending results of labs. Future Appointments Date Time Provider [place]  07/31/2017 10:00 AM [name], MD [facility] None    HPI 66 y.o. female  presents for 3 month follow up with hypertension, hyperlipidemia, prediabetes and vitamin D, she had gastric bypass and has done a great job with weight loss getting off her meds.  Patient also complains  of symptoms of a URI, possible sinusitis. Symptoms include congestion, facial pain, nasal congestion, no  fever, sinus pressure and tooth pain. Onset of symptoms was 2 weeks ago, and has been gradually worsening since that time. Treatment to date: mucinex, allergy pill, flonase.  Her blood pressure has been controlled at home, she is back on half of lisinopril 10mg  from Dr. Percival Spanish, today their BP is BP: 138/84  She does not workout. She denies chest pain, shortness of breath, dizziness.  She is on cholesterol medication, pravastatin 40mg  1/2 pill every other day but has been off due to elevated liver function. and denies myalgias. Her cholesterol is at goal. The cholesterol last visit was:   Lab Results  Component Value Date   CHOL 133 12/25/2016   HDL 71 12/25/2016   LDLCALC 50 12/25/2016   TRIG 58 12/25/2016   CHOLHDL 1.9 12/25/2016    She has been working on diet and exercise for prediabetes, and denies paresthesia of the feet, polydipsia, polyuria and visual disturbances.  Last A1C in the office was:  Lab Results  Component Value Date   HGBA1C 5.3 12/25/2016   Patient is on Vitamin D supplement, on 50,000 QOD.   Lab Results  Component Value Date   VD25OH 67 12/25/2016     She is on thyroid medication. Her medication was not changed last visit.   Lab Results  Component Value Date   TSH 1.44 12/25/2016  .  BMI is Body mass index is 28.95 kg/m., she is working on diet and exercise. Wt Readings from Last 3 Encounters:  04/16/17 163 lb 6.4 oz (74.1 kg)  01/24/17 165 lb (74.8 kg)  01/14/17 166 lb (75.3 kg)    Current Medications:  Current Outpatient Prescriptions on File Prior to Visit  Medication Sig Dispense Refill  . acyclovir (ZOVIRAX) 400 MG tablet Take 1 tablet (400 mg total) by mouth daily. 90 tablet 1  . Ascorbic Acid (VITAMIN C) 500 MG CAPS Take by mouth daily.    Marland Kitchen aspirin 81 MG tablet Take 81 mg by mouth daily.    . Biotin 10 MG TABS Take 10,000 each by mouth daily.    . Calcium Citrate-Vitamin D (CALCIUM CITRATE + D3 PO) Take 600 mg by mouth daily.     . cyclobenzaprine (FLEXERIL) 10 MG tablet Take 1 tablet (10 mg total) by mouth 3 (three) times daily as needed for muscle spasms. 90 tablet 1  . diphenhydramine-acetaminophen (TYLENOL PM) 25-500 MG TABS tablet Take 1 tablet by mouth at bedtime as needed.    . Ferrous Sulfate (SLOW RELEASE IRON PO) Take by mouth daily.    Marland Kitchen  fish oil-omega-3 fatty acids 1000 MG capsule Take 1 g by mouth daily.    . fluticasone (FLONASE) 50 MCG/ACT nasal spray Place 2 sprays into both nostrils daily. 16 g 0  . levothyroxine (SYNTHROID, LEVOTHROID) 75 MCG tablet TAKE ONE TABLET BY MOUTH ONCE DAILY 90 tablet 1  . Magnesium 250 MG TABS Take by mouth daily.    . Melatonin 10 MG TABS Take 1 tablet by mouth at bedtime.    . meloxicam (MOBIC) 15 MG tablet Take 1 tablet (15 mg total) by mouth daily. 90 tablet 0  . OVER THE COUNTER MEDICATION Takes Allegra 25 mg at bedtime.    . potassium chloride (K-DUR) 10 MEQ tablet  TAKE ONE TABLET BY MOUTH TWICE DAILY 120 tablet 1  . pravastatin (PRAVACHOL) 40 MG tablet TAKE ONE TABLET BY MOUTH ONCE DAILY 90 tablet 2  . ranitidine (ZANTAC) 300 MG tablet Take 1 to 2 tablets daily for heartburn & reflux 180 tablet 1  . rOPINIRole (REQUIP) 0.5 MG tablet START BY TAKING ONE-HALF TABLET AT NIGHT. CAN INCREASE TO TAKE ONE TABLET, THEN MAY INCREASE UP TO FOUR TABLETS AT NIGHT AS DIRECTED 90 tablet 1  . vitamin B-12 (CYANOCOBALAMIN) 1000 MCG tablet Take 1,000 mcg by mouth daily.     . Vitamin D, Ergocalciferol, (DRISDOL) 50000 units CAPS capsule TAKE ONE CAPSULE BY MOUTH ONCE DAILY 90 capsule 1   No current facility-administered medications on file prior to visit.    Medical History:  Past Medical History:  Diagnosis Date  . Allergic rhinitis   . Arthritis   . Cholecystitis, chronic   . Dysrhythmia    Hx bradycardia - hx normal cardiac cath x2 / ECHO 2009 EF 60% LAE MYOVIEW 3/12 EF 60% NO ISCHEMIA  . Hyperlipidemia   . Hypertension    no treatment after 120 lb weight loss  . Hypothyroidism   . Prediabetes   . Thyroid nodule    Allergies:  Allergies  Allergen Reactions  . Codeine Other (See Comments)    "puts me out of it".  Dysphoria.  Patient tolerates Tramadol without complications.  . Nabumetone     GI upset     Review of Systems:  Review of Systems  Constitutional: Negative.   HENT: Negative.   Eyes: Negative.   Respiratory: Negative.   Cardiovascular: Negative.   Gastrointestinal: Negative for abdominal pain, blood in stool, constipation, diarrhea, heartburn, melena, nausea and vomiting.  Genitourinary: Negative.   Musculoskeletal: Positive for back pain, myalgias (worse at night/rest, better with movement) and neck pain. Negative for falls and joint pain.  Skin: Negative.   Neurological: Negative.   Endo/Heme/Allergies: Negative.   Psychiatric/Behavioral: Negative.     Family history- Review and unchanged Social history- Review and  unchanged Physical Exam: BP 138/84   Pulse (!) 58   Temp 97.7 F (36.5 C)   Resp 16   Ht 5\' 3"  (1.6 m)   Wt 163 lb 6.4 oz (74.1 kg)   LMP  (LMP Unknown)   BMI 28.95 kg/m  Wt Readings from Last 3 Encounters:  04/16/17 163 lb 6.4 oz (74.1 kg)  01/24/17 165 lb (74.8 kg)  01/14/17 166 lb (75.3 kg)   General Appearance: Well nourished, in no apparent distress. Eyes: PERRLA, EOMs, conjunctiva no swelling or erythema Sinuses: No Frontal/maxillary tenderness ENT/Mouth: Ext aud canals clear, TMs without erythema, bulging. No erythema, swelling, or exudate on post pharynx.  Tonsils not swollen or erythematous. Hearing normal.  Neck: Supple, thyroid normal.  Respiratory: Respiratory effort normal, BS equal bilaterally without rales, rhonchi, wheezing or stridor.  Cardio: RRR with no MRGs. Brisk peripheral pulses without edema.  Abdomen: Soft, + BS,  Mild RLQ tender, no guarding, rebound, hernias, masses. Lymphatics: Non tender without lymphadenopathy.  Musculoskeletal: Full ROM, 5/5 strength, Normal gait.  Skin: Warm, dry without rashes, lesions, ecchymosis.  Neuro: Cranial nerves intact. Normal muscle tone, no cerebellar symptoms. Psych: Awake and oriented X 3, normal affect, Insight and Judgment appropriate.    Vicie Mutters, PA-C 11:31 AM [facility]

## 2017-04-16 ENCOUNTER — Ambulatory Visit (INDEPENDENT_AMBULATORY_CARE_PROVIDER_SITE_OTHER): Payer: Federal, State, Local not specified - PPO | Admitting: Physician Assistant

## 2017-04-16 VITALS — BP 138/84 | HR 58 | Temp 97.7°F | Resp 16 | Ht 63.0 in | Wt 163.4 lb

## 2017-04-16 DIAGNOSIS — E782 Mixed hyperlipidemia: Secondary | ICD-10-CM | POA: Diagnosis not present

## 2017-04-16 DIAGNOSIS — E1121 Type 2 diabetes mellitus with diabetic nephropathy: Secondary | ICD-10-CM | POA: Diagnosis not present

## 2017-04-16 DIAGNOSIS — J069 Acute upper respiratory infection, unspecified: Secondary | ICD-10-CM

## 2017-04-16 DIAGNOSIS — E559 Vitamin D deficiency, unspecified: Secondary | ICD-10-CM | POA: Diagnosis not present

## 2017-04-16 DIAGNOSIS — Z79899 Other long term (current) drug therapy: Secondary | ICD-10-CM

## 2017-04-16 DIAGNOSIS — I10 Essential (primary) hypertension: Secondary | ICD-10-CM

## 2017-04-16 DIAGNOSIS — E039 Hypothyroidism, unspecified: Secondary | ICD-10-CM | POA: Diagnosis not present

## 2017-04-16 DIAGNOSIS — Z9884 Bariatric surgery status: Secondary | ICD-10-CM

## 2017-04-16 LAB — CBC WITH DIFFERENTIAL/PLATELET
Basophils Absolute: 56 cells/uL (ref 0–200)
Basophils Relative: 1 %
EOS PCT: 5 %
Eosinophils Absolute: 280 cells/uL (ref 15–500)
HEMATOCRIT: 35.3 % (ref 35.0–45.0)
Hemoglobin: 11.7 g/dL (ref 11.7–15.5)
LYMPHS PCT: 53 %
Lymphs Abs: 2968 cells/uL (ref 850–3900)
MCH: 30.7 pg (ref 27.0–33.0)
MCHC: 33.1 g/dL (ref 32.0–36.0)
MCV: 92.7 fL (ref 80.0–100.0)
MONO ABS: 448 {cells}/uL (ref 200–950)
MONOS PCT: 8 %
MPV: 10 fL (ref 7.5–12.5)
NEUTROS PCT: 33 %
Neutro Abs: 1848 cells/uL (ref 1500–7800)
Platelets: 276 10*3/uL (ref 140–400)
RBC: 3.81 MIL/uL (ref 3.80–5.10)
RDW: 14 % (ref 11.0–15.0)
WBC: 5.6 10*3/uL (ref 3.8–10.8)

## 2017-04-16 LAB — BASIC METABOLIC PANEL WITH GFR
BUN: 9 mg/dL (ref 7–25)
CALCIUM: 9 mg/dL (ref 8.6–10.4)
CO2: 25 mmol/L (ref 20–31)
Chloride: 105 mmol/L (ref 98–110)
Creat: 0.89 mg/dL (ref 0.50–0.99)
GFR, EST AFRICAN AMERICAN: 79 mL/min (ref >=60)
GFR, EST NON AFRICAN AMERICAN: 69 mL/min (ref >=60)
Glucose, Bld: 84 mg/dL (ref 65–99)
Potassium: 4.3 mmol/L (ref 3.5–5.3)
Sodium: 139 mmol/L (ref 135–146)

## 2017-04-16 LAB — HEPATIC FUNCTION PANEL
ALT: 15 U/L (ref 6–29)
AST: 19 U/L (ref 10–35)
Albumin: 3.9 g/dL (ref 3.6–5.1)
Alkaline Phosphatase: 100 U/L (ref 33–130)
BILIRUBIN INDIRECT: 1.6 mg/dL — AB (ref 0.2–1.2)
Bilirubin, Direct: 0.4 mg/dL — ABNORMAL HIGH (ref <=0.2)
TOTAL PROTEIN: 6.2 g/dL (ref 6.1–8.1)
Total Bilirubin: 2 mg/dL — ABNORMAL HIGH (ref 0.2–1.2)

## 2017-04-16 LAB — LIPID PANEL
CHOLESTEROL: 136 mg/dL (ref <200)
HDL: 69 mg/dL (ref >50)
LDL Cholesterol: 56 mg/dL (ref <100)
TRIGLYCERIDES: 57 mg/dL (ref <150)
Total CHOL/HDL Ratio: 2 Ratio (ref <5.0)
VLDL: 11 mg/dL (ref <30)

## 2017-04-16 LAB — TSH: TSH: 1.06 m[IU]/L

## 2017-04-16 MED ORDER — AZITHROMYCIN 250 MG PO TABS: ORAL_TABLET | ORAL | 1 refills | Status: AC

## 2017-04-16 MED ORDER — PREDNISONE 20 MG PO TABS: ORAL_TABLET | ORAL | 0 refills | Status: DC

## 2017-04-16 NOTE — Patient Instructions (Signed)
The majority of colds are caused by viruses and do not require antibiotics. Please read the rest of this hand out to learn more about the common cold and what you can do to help yourself as well as help prevent the over use of antibiotics.   COMMON COLD SIGNS AND SYMPTOMS - The common cold usually causes nasal congestion, runny nose, and sneezing. A sore throat may be present on the first day but usually resolves quickly. If a cough occurs, it generally develops on about the fourth or fifth day of symptoms, and is always the last thing to go away.   COMMON COLD COMPLICATIONS - In most cases, colds do not cause serious illness or complications. Most colds last for three to seven days, although many people continue to have symptoms (coughing, sneezing, congestion) for up to two weeks.  COMMON COLD TREATMENT - There is no specific treatment for the viruses that cause the common cold. Most treatments are aimed at relieving some of the symptoms of the cold, but do not shorten or cure the cold.   Antibiotics are not useful for treating the common cold; antibiotics are only used to treat illnesses caused by bacteria, not viruses. Unnecessary use of antibiotics for the treatment of the common cold can cause allergic reactions, diarrhea, or other gastrointestinal symptoms in some patients.   The symptoms of a cold will resolve over time, even without any treatment. People with underlying medical conditions and those who use other over-the-counter or prescription medications should speak with their healthcare provider or pharmacist to ensure that it is safe to use these treatments. The following are treatments that may reduce the symptoms caused by the common cold.  Nasal congestion - Decongestants are good for nasal congestion- if you feel very stuffy but no mucus is coming out, this is the medication that will help you the most.  Pseudoephedrine is a decongestant that can improve nasal congestion. Although a  prescription is not required, drugstores in the [place] keep pseudoephedrine behind the counter, so it must be requested from a pharmacist. If you have a heart condition or high blood pressure please use Coricidin BPH instead.   Runny nose - Antihistamines such as diphenhydramine (Benadryl), certazine (Zyrtec) which are best taking at night because they can make you tired OR loratadine (Claritin),  fexafinadine (Allegra) help with a runny nose.   Nasal sprays such an oxymetazoline (Afrin and others) may also give temporary relief of nasal congestion. However, these sprays should never be used for more than two to three days; use for more than three days use can worsen congestion.  Nasocort is now over the counter and can help decrease a runny nose. Please stop the medication if you have blurry vision or nose bleeds.   Sore throat and headache - Sore throat and headache are best treated with a mild pain reliever such as acetaminophen (Tylenol) or a non-steroidal anti-inflammatory agent such as ibuprofen or naproxen (Motrin or Aleve). These medications should be taken with food to prevent stomach problems. As well as gargling with warm water and salt.   Cough - Common cough medicine ingredients include guaifenesin and dextromethorphan; these are often combined with other medications in over-the-counter cold formulas. Often a cough is worse at night or first in the morning due to post nasal drip from you nose. You can try to sleep at an angle to decrease a cough.   Alternative treatments - Heated, humidified air can improve symptoms of nasal congestion and   runny nose, and causes few to no side effects. A number of alternative products, including vitamin C, doubling up on your vitamin D and herbal products such as echinacea, may help. Certain products, such as nasal gels that contain zinc (eg, Zicam), have been associated with a permanent loss of smell.  Antibiotics - Antibiotics should not be used to  treat an uncomplicated common cold. Often you need to give your body 7-10 days to fight off a common cold while treating the symptoms with the medications listed above. If after 7-10 days your symptoms are not improving, you are getting worse, you have shortness of breath, chest pain, a fever of over 103 you should seek medical help immediately.   PREVENTION IS THE BEST MEDICINE - Hand washing is an essential and highly effective way to prevent the spread of infection.  Alcohol-based hand rubs are a good alternative for disinfecting hands if a sink is not available.  Hands should be washed before preparing food and eating and after coughing, blowing the nose, or sneezing. While it is not always possible to limit contact with people who may be infected with a cold, touching the eyes, nose, or mouth after direct contact should be avoided when possible. Sneezing/coughing into the sleeve of one's clothing (at the inner elbow) is another means of containing sprays of saliva and secretions and does not contaminate the hands.     

## 2017-04-17 LAB — MAGNESIUM: MAGNESIUM: 2 mg/dL (ref 1.5–2.5)

## 2017-04-17 LAB — HEMOGLOBIN A1C
Hgb A1c MFr Bld: 5.3 % (ref <5.7)
Mean Plasma Glucose: 105 mg/dL

## 2017-05-16 ENCOUNTER — Other Ambulatory Visit: Payer: Self-pay | Admitting: Physician Assistant

## 2017-06-20 ENCOUNTER — Encounter: Payer: Self-pay | Admitting: Internal Medicine

## 2017-07-16 ENCOUNTER — Encounter (HOSPITAL_COMMUNITY): Payer: Self-pay

## 2017-07-31 ENCOUNTER — Encounter: Payer: Self-pay | Admitting: Internal Medicine

## 2017-07-31 ENCOUNTER — Ambulatory Visit (INDEPENDENT_AMBULATORY_CARE_PROVIDER_SITE_OTHER): Payer: Federal, State, Local not specified - PPO | Admitting: Internal Medicine

## 2017-07-31 VITALS — BP 134/78 | HR 60 | Temp 97.5°F | Resp 18 | Ht 63.0 in | Wt 162.2 lb

## 2017-07-31 DIAGNOSIS — N182 Chronic kidney disease, stage 2 (mild): Secondary | ICD-10-CM

## 2017-07-31 DIAGNOSIS — Z23 Encounter for immunization: Secondary | ICD-10-CM

## 2017-07-31 DIAGNOSIS — E559 Vitamin D deficiency, unspecified: Secondary | ICD-10-CM

## 2017-07-31 DIAGNOSIS — E039 Hypothyroidism, unspecified: Secondary | ICD-10-CM

## 2017-07-31 DIAGNOSIS — Z0000 Encounter for general adult medical examination without abnormal findings: Secondary | ICD-10-CM

## 2017-07-31 DIAGNOSIS — R5383 Other fatigue: Secondary | ICD-10-CM

## 2017-07-31 DIAGNOSIS — I10 Essential (primary) hypertension: Secondary | ICD-10-CM

## 2017-07-31 DIAGNOSIS — Z1212 Encounter for screening for malignant neoplasm of rectum: Secondary | ICD-10-CM

## 2017-07-31 DIAGNOSIS — Z79899 Other long term (current) drug therapy: Secondary | ICD-10-CM | POA: Diagnosis not present

## 2017-07-31 DIAGNOSIS — B373 Candidiasis of vulva and vagina: Secondary | ICD-10-CM

## 2017-07-31 DIAGNOSIS — Z136 Encounter for screening for cardiovascular disorders: Secondary | ICD-10-CM

## 2017-07-31 DIAGNOSIS — E782 Mixed hyperlipidemia: Secondary | ICD-10-CM

## 2017-07-31 DIAGNOSIS — R1031 Right lower quadrant pain: Secondary | ICD-10-CM

## 2017-07-31 DIAGNOSIS — Z1211 Encounter for screening for malignant neoplasm of colon: Secondary | ICD-10-CM

## 2017-07-31 DIAGNOSIS — E1122 Type 2 diabetes mellitus with diabetic chronic kidney disease: Secondary | ICD-10-CM

## 2017-07-31 DIAGNOSIS — Z0001 Encounter for general adult medical examination with abnormal findings: Secondary | ICD-10-CM

## 2017-07-31 DIAGNOSIS — G8929 Other chronic pain: Secondary | ICD-10-CM

## 2017-07-31 DIAGNOSIS — B3731 Acute candidiasis of vulva and vagina: Secondary | ICD-10-CM

## 2017-07-31 MED ORDER — FLUCONAZOLE 150 MG PO TABS: ORAL_TABLET | ORAL | 3 refills | Status: DC

## 2017-07-31 NOTE — Patient Instructions (Signed)

## 2017-07-31 NOTE — Progress Notes (Signed)
[facility] Tracy Paul, M.D.     Tracy Paul. Tracy Paul, P.A.-C Tracy Paul, Tracy Paul [address] [place], [place] [address] Telephone [phone] Telefax [phone] Annual Screening/Preventative Visit & Comprehensive Evaluation &  Examination     This very nice 66 y.o. MWF presents for a Screening/Preventative Visit & comprehensive evaluation and management of multiple medical co-morbidities.  Patient has been followed for HTN, T2_NIDDM  Prediabetes, Hyperlipidemia and Vitamin D Deficiency.      HTN predates circa 1995. Patient's BP has been controlled at home and patient denies any cardiac symptoms as chest pain, palpitations, shortness of breath, dizziness or ankle swelling. Patient has had Normal Heart Caths x 2 in 1995/2000. Today's BP is at goal - 134/78.      Patient's hyperlipidemia is controlled with diet and medications. Patient denies myalgias or other medication SE's. 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 With this weight loss, her A1c's have normalized and her Diabetes cured.  Patient denies reactive hypoglycemic symptoms, visual blurring, diabetic polys, or paresthesias. Last A1c was at goal: Lab Results  Component Value Date   HGBA1C 5.3 04/16/2017      Patient was dx'd Hypothyroid in 2008 and has been on replacement therapy since. Finally, patient has history of Vitamin D Deficiency ("28" / 2008)  and last Vitamin D was at goal: Lab Results  Component Value Date   VD25OH 67 12/25/2016   Current Outpatient Prescriptions on File Prior to Visit  Medication Sig  . acyclovir  (ZOVIRAX) 400 MG tablet Take 1 tablet (400 mg total) by mouth daily.  . Ascorbic Acid (VITAMIN C) 500 MG CAPS Take by mouth daily.  Marland Kitchen aspirin 81 MG tablet Take 81 mg by mouth daily.  . Biotin 10 MG TABS Take 10,000 each by mouth daily.  . Calcium Citrate-Vitamin D (CALCIUM CITRATE + D3 PO) Take 600 mg by mouth daily.   . cyclobenzaprine (FLEXERIL) 10 MG tablet Take 1 tablet (10 mg total) by mouth 3 (three) times daily as needed for muscle spasms.  . diphenhydramine-acetaminophen (TYLENOL PM) 25-500 MG TABS tablet Take 1 tablet by mouth at bedtime as needed.  . Ferrous Sulfate (SLOW RELEASE IRON PO) Take by mouth daily.  . fish oil-omega-3 fatty acids 1000 MG capsule Take 1 g by mouth daily.  . fluticasone (FLONASE) 50 MCG/ACT nasal spray Place 2 sprays into both nostrils daily.  Marland Kitchen levothyroxine (SYNTHROID, LEVOTHROID) 75 MCG tablet TAKE ONE TABLET BY MOUTH ONCE DAILY  . Magnesium 250 MG TABS Take by mouth daily.  . Melatonin 10 MG TABS Take 1 tablet by mouth at bedtime.  . meloxicam (MOBIC) 15 MG tablet Take 1 tablet (15 mg total) by mouth daily.  Marland Kitchen OVER THE COUNTER MEDICATION Takes Allegra 25 mg at bedtime.  . potassium chloride (K-DUR) 10 MEQ tablet TAKE ONE TABLET BY MOUTH TWICE DAILY  . pravastatin (PRAVACHOL) 40 MG tablet TAKE ONE TABLET BY MOUTH ONCE DAILY  . ranitidine (ZANTAC) 300 MG tablet Take 1 to 2 tablets daily for heartburn & reflux  . rOPINIRole (REQUIP) 0.5 MG  tablet START BY TAKING ONE-HALF TABLET AT NIGHT. CAN INCREASE TO TAKE ONE TABLET, THEN MAY INCREASE UP TO FOUR TABLETS AT NIGHT AS DIRECTED  . vitamin B-12 (CYANOCOBALAMIN) 1000 MCG tablet Take 1,000 mcg by mouth daily.   . Vitamin D, Ergocalciferol, (DRISDOL) 50000 units CAPS capsule TAKE ONE CAPSULE BY MOUTH ONCE DAILY   No current facility-administered medications on file prior to visit.    Allergies  Allergen Reactions  . Codeine Other (See Comments)    "puts me out of it".  Dysphoria.  Patient tolerates Tramadol  without complications.  . Nabumetone     GI upset   Past Medical History:  Diagnosis Date  . Allergic rhinitis   . Arthritis   . Cholecystitis, chronic   . Dysrhythmia    Hx bradycardia - hx normal cardiac cath x2 / ECHO 2009 EF 60% LAE MYOVIEW 3/12 EF 60% NO ISCHEMIA  . Hyperlipidemia   . Hypertension    no treatment after 120 lb weight loss  . Hypothyroidism   . Prediabetes   . Thyroid nodule    Health Maintenance  Topic Date Due  . OPHTHALMOLOGY EXAM  05/06/1962  . PAP SMEAR  12/20/2013  . MAMMOGRAM  07/25/2014  . PNA vac Low Risk Adult (1 of 2 - PCV13) 05/06/2017  . FOOT EXAM  05/23/2017  . URINE MICROALBUMIN  05/23/2017  . INFLUENZA VACCINE  05/29/2017  . HEMOGLOBIN A1C  10/16/2017  . COLONOSCOPY  03/17/2024  . TETANUS/TDAP  05/23/2026  . DEXA SCAN  Completed  . Hepatitis C Screening  Completed  . HIV Screening  Completed   Immunization History  Administered Date(s) Administered  . Influenza Split 08/25/2013, 09/02/2014, 08/10/2015  . Influenza,inj,quad, With Preservative 08/10/2016  . PPD Test 02/23/2014, 05/23/2016  . Pneumococcal Polysaccharide-23 10/18/2008  . Td 08/08/2006  . Tdap 05/23/2016  . Zoster 10/15/2012   Past Surgical History:  Procedure Laterality Date  . Bilateral carpal tunnel surgery    . BREATH TEK H PYLORI  07/01/2012   Procedure: BREATH TEK H PYLORI;  Surgeon: Edward Jolly, MD;  Location: [facility];  Service: General;  Laterality: N/A;  . CHOLECYSTECTOMY N/A 11/27/2013   Procedure: LAPAROSCOPIC CHOLECYSTECTOMY WITH INTRAOPERATIVE CHOLANGIOGRAM;  Surgeon: Pedro Earls, MD;  Location: [facility];  Service: General;  Laterality: N/A;  . GASTRIC ROUX-EN-Y N/A 12/16/2012   Procedure: LAPAROSCOPIC ROUX-EN-Y GASTRIC BYPASS WITH UPPER ENDOSCOPY;  Surgeon: Pedro Earls, MD;  Location: [facility];  Service: General;  Laterality: N/A;  Gastric Bypass  . NASAL SINUS SURGERY    . REPLACEMENT TOTAL KNEE BILATERAL     bilat  . TUBAL LIGATION      Family History  Problem Relation Age of Onset  . Breast cancer Mother   . Cancer Mother        breast cancer  . Anuerysm Mother        brain anuerysm  . Heart attack Father 72  . Heart attack Sister 83   Social History  Substance Use Topics  . Smoking status: Former Smoker    Packs/day: 0.80    Years: 10.00    Types: Cigarettes    Quit date: 10/30/1979  . Smokeless tobacco: Never Used  . Alcohol use No    ROS Constitutional: Denies fever, chills, weight loss/gain, headaches, insomnia,  night sweats, and change in appetite. Does c/o fatigue. Eyes: Denies redness, blurred vision, diplopia, discharge, itchy, watery eyes.  ENT: Denies discharge, congestion, post nasal drip, epistaxis, sore throat,  earache, hearing loss, dental pain, Tinnitus, Vertigo, Sinus pain, snoring.  Cardio: Denies chest pain, palpitations, irregular heartbeat, syncope, dyspnea, diaphoresis, orthopnea, PND, claudication, edema Respiratory: denies cough, dyspnea, DOE, pleurisy, hoarseness, laryngitis, wheezing.  Gastrointestinal: Denies dysphagia, heartburn, reflux, water brash, pain, cramps, nausea, vomiting, bloating, diarrhea, constipation, hematemesis, melena, hematochezia, jaundice, hemorrhoids. C/o a vague discomfort in the RLQ. Genitourinary: Denies dysuria, frequency, urgency, nocturia, hesitancy,  hematuria, flank pain. C/o of a vag discharge & inguinal intertrigo.  Breast: Breast lumps, nipple discharge, bleeding.  Musculoskeletal: Denies arthralgia, myalgia, stiffness, Jt. Swelling, pain, limp, and strain/sprain. Denies falls. Skin: Denies puritis, rash, hives, warts, acne, eczema, changing in skin lesion Neuro: No weakness, tremor, incoordination, spasms, paresthesia, pain Psychiatric: Denies confusion, memory loss, sensory loss. Denies Depression. Endocrine: Denies change in weight, skin, hair change, nocturia, and paresthesia, diabetic polys, visual blurring, hyper / hypo glycemic episodes.   Heme/Lymph: No excessive bleeding, bruising, enlarged lymph nodes.  Physical Exam  BP 134/78   Pulse 60   Temp (!) 97.5 F (36.4 C)   Resp 18   Ht 5\' 3"  (1.6 m)   Wt 162 lb 3.2 oz (73.6 kg)   LMP  (LMP Unknown)   BMI 28.73 kg/m   General Appearance: Well nourished, well groomed and in no apparent distress.  Eyes: PERRLA, EOMs, conjunctiva no swelling or erythema, normal fundi and vessels. Sinuses: No frontal/maxillary tenderness ENT/Mouth: EACs patent / TMs  nl. Nares clear without erythema, swelling, mucoid exudates. Oral hygiene is good. No erythema, swelling, or exudate. Tongue normal, non-obstructing. Tonsils not swollen or erythematous. Hearing normal.  Neck: Supple, thyroid normal. No bruits, nodes or JVD. Respiratory: Respiratory effort normal.  BS equal and clear bilateral without rales, rhonci, wheezing or stridor. Cardio: Heart sounds are normal with regular rate and rhythm and no murmurs, rubs or gallops. Peripheral pulses are normal and equal bilaterally without edema. No aortic or femoral bruits. Chest: symmetric with normal excursions and percussion. Breasts: Symmetric, without lumps, nipple discharge, retractions, or fibrocystic changes.  Abdomen: Soft with bowel sounds active. Vague tenderness and ? of fullness in the RLQ w/o G or RB. BS Nl.  Lymphatics: Non tender without lymphadenopathy.  Musculoskeletal: Full ROM all peripheral extremities, joint stability, 5/5 strength, and normal gait. Skin: Warm and dry without lesions, cyanosis, clubbing or  ecchymosis. Inguinal intertrigo suspect candidiasis.  Neuro: Cranial nerves intact, reflexes equal bilaterally. Normal muscle tone, no cerebellar symptoms. Sensation intact.  Pysch: Alert and oriented X 3, normal affect, Insight and Judgment appropriate.   Assessment and Plan  1. Annual Preventative Screening Examination  2. Essential hypertension  - EKG 12-Lead - Urinalysis, Routine w reflex microscopic -  Microalbumin / creatinine urine ratio - CBC with Differential/Platelet - BASIC METABOLIC PANEL WITH GFR - Magnesium - TSH  3. Hyperlipidemia, mixed  - EKG 12-Lead - Hepatic function panel - Lipid panel - TSH  4. Type 2 diabetes mellitus with stage 2 chronic kidney disease, without long-term current use of insulin (HCC)  - EKG 12-Lead - Urinalysis, Routine w reflex microscopic - Microalbumin / creatinine urine ratio - HM DIABETES FOOT EXAM - LOW EXTREMITY NEUR EXAM DOCUM - Hemoglobin A1c - Insulin, random  5. Vitamin D deficiency  - VITAMIN D 25 Hydroxy   6. Hypothyroidism  - TSH  7. Screening for ischemic heart disease  - EKG 12-Lead  8. Screening for colorectal cancer  - POC Hemoccult Bld/Stl   9. Other fatigue  - Iron,Total/Total Iron Binding Cap - Vitamin B12 -  CBC with Differential/Platelet  10. Medication management  - Urinalysis, Routine w reflex microscopic - CBC with Differential/Platelet - BASIC METABOLIC PANEL WITH GFR - Hepatic function panel - Magnesium - Lipid panel - TSH - Hemoglobin A1c - Insulin, random - VITAMIN D 25 Hydroxy  11. Chronic RLQ pain  - CT Abdomen Pelvis Wo Contrast  12. Right lower quadrant abdominal pain  - CT Abdomen Pelvis Wo Contrast; Future  13. Need for immunization against influenza  - Flu vaccine HIGH DOSE PF (Fluzone High dose)          Patient was counseled in prudent diet to achieve/maintain BMI less than 25 for weight control, BP monitoring, regular exercise and medications. Discussed med's effects and SE's. Screening labs and tests as requested with regular follow-up as recommended. Over 40 minutes of exam, counseling, chart review and high complex critical decision making was performed.

## 2017-08-01 ENCOUNTER — Ambulatory Visit
Admission: RE | Admit: 2017-08-01 | Discharge: 2017-08-01 | Disposition: A | Discharge status: Home / Self Care | Payer: Medicare Other | Source: Ambulatory Visit | Attending: Internal Medicine | Admitting: Internal Medicine

## 2017-08-01 DIAGNOSIS — G8929 Other chronic pain: Secondary | ICD-10-CM

## 2017-08-01 DIAGNOSIS — R1031 Right lower quadrant pain: Secondary | ICD-10-CM

## 2017-08-01 LAB — LIPID PANEL
CHOLESTEROL: 157 mg/dL (ref <200)
HDL: 76 mg/dL (ref >50)
LDL Cholesterol (Calc): 67 mg/dL (calc)
Non-HDL Cholesterol (Calc): 81 mg/dL (calc) (ref <130)
TRIGLYCERIDES: 67 mg/dL (ref <150)
Total CHOL/HDL Ratio: 2.1 (calc) (ref <5.0)

## 2017-08-01 LAB — HEPATIC FUNCTION PANEL
AG Ratio: 1.6 (calc) (ref 1.0–2.5)
ALBUMIN MSPROF: 4.1 g/dL (ref 3.6–5.1)
ALT: 16 U/L (ref 6–29)
AST: 22 U/L (ref 10–35)
Alkaline phosphatase (APISO): 109 U/L (ref 33–130)
BILIRUBIN TOTAL: 1.3 mg/dL — AB (ref 0.2–1.2)
Bilirubin, Direct: 0.3 mg/dL — ABNORMAL HIGH (ref 0.0–0.2)
Globulin: 2.6 g/dL (calc) (ref 1.9–3.7)
Indirect Bilirubin: 1 mg/dL (calc) (ref 0.2–1.2)
TOTAL PROTEIN: 6.7 g/dL (ref 6.1–8.1)

## 2017-08-01 LAB — URINALYSIS, ROUTINE W REFLEX MICROSCOPIC
BILIRUBIN URINE: NEGATIVE
Bacteria, UA: NONE SEEN /HPF
GLUCOSE, UA: NEGATIVE
Hgb urine dipstick: NEGATIVE
Hyaline Cast: NONE SEEN /LPF
KETONES UR: NEGATIVE
NITRITE: NEGATIVE
PROTEIN: NEGATIVE
RBC / HPF: NONE SEEN /HPF (ref 0–2)
SPECIFIC GRAVITY, URINE: 1.011 (ref 1.001–1.03)
SQUAMOUS EPITHELIAL / LPF: NONE SEEN /HPF (ref <=5)
WBC, UA: NONE SEEN /HPF (ref 0–5)
pH: 5.5 (ref 5.0–8.0)

## 2017-08-01 LAB — CBC WITH DIFFERENTIAL/PLATELET
BASOS ABS: 72 {cells}/uL (ref 0–200)
Basophils Relative: 1.3 %
EOS PCT: 2.9 %
Eosinophils Absolute: 160 cells/uL (ref 15–500)
HCT: 37.7 % (ref 35.0–45.0)
Hemoglobin: 12.7 g/dL (ref 11.7–15.5)
LYMPHS ABS: 2552 {cells}/uL (ref 850–3900)
MCH: 30.6 pg (ref 27.0–33.0)
MCHC: 33.7 g/dL (ref 32.0–36.0)
MCV: 90.8 fL (ref 80.0–100.0)
MONOS PCT: 7.7 %
MPV: 10.6 fL (ref 7.5–12.5)
NEUTROS PCT: 41.7 %
Neutro Abs: 2294 cells/uL (ref 1500–7800)
PLATELETS: 310 10*3/uL (ref 140–400)
RBC: 4.15 10*6/uL (ref 3.80–5.10)
RDW: 12.2 % (ref 11.0–15.0)
TOTAL LYMPHOCYTE: 46.4 %
WBC mixed population: 424 cells/uL (ref 200–950)
WBC: 5.5 10*3/uL (ref 3.8–10.8)

## 2017-08-01 LAB — BASIC METABOLIC PANEL WITH GFR
BUN: 11 mg/dL (ref 7–25)
CALCIUM: 9.3 mg/dL (ref 8.6–10.4)
CHLORIDE: 103 mmol/L (ref 98–110)
CO2: 28 mmol/L (ref 20–32)
Creat: 0.98 mg/dL (ref 0.50–0.99)
GFR, EST NON AFRICAN AMERICAN: 61 mL/min/{1.73_m2} (ref >=60)
GFR, Est African American: 70 mL/min/{1.73_m2} (ref >=60)
GLUCOSE: 92 mg/dL (ref 65–99)
POTASSIUM: 4 mmol/L (ref 3.5–5.3)
Sodium: 140 mmol/L (ref 135–146)

## 2017-08-01 LAB — TSH: TSH: 1.57 mIU/L (ref 0.40–4.50)

## 2017-08-01 LAB — VITAMIN D 25 HYDROXY (VIT D DEFICIENCY, FRACTURES): Vit D, 25-Hydroxy: 60 ng/mL (ref 30–100)

## 2017-08-01 LAB — MICROALBUMIN / CREATININE URINE RATIO: CREATININE, URINE: 69 mg/dL (ref 20–275)

## 2017-08-01 LAB — VITAMIN B12

## 2017-08-01 LAB — MAGNESIUM: MAGNESIUM: 2.1 mg/dL (ref 1.5–2.5)

## 2017-08-01 LAB — HEMOGLOBIN A1C
HEMOGLOBIN A1C: 5.4 %{Hb} (ref <5.7)
MEAN PLASMA GLUCOSE: 108 (calc)
eAG (mmol/L): 6 (calc)

## 2017-08-01 LAB — INSULIN, RANDOM: Insulin: 2.8 u[IU]/mL (ref 2.0–19.6)

## 2017-08-01 LAB — IRON, TOTAL/TOTAL IRON BINDING CAP
%SAT: 31 % (calc) (ref 11–50)
Iron: 103 ug/dL (ref 45–160)
TIBC: 330 ug/dL (ref 250–450)

## 2017-08-02 ENCOUNTER — Other Ambulatory Visit: Payer: Self-pay | Admitting: Internal Medicine

## 2017-08-02 MED ORDER — PROMETHAZINE-DM 6.25-15 MG/5ML PO SYRP: ORAL_SOLUTION | ORAL | 1 refills | Status: DC

## 2017-08-02 MED ORDER — AZITHROMYCIN 250 MG PO TABS: ORAL_TABLET | ORAL | 1 refills | Status: DC

## 2017-08-02 MED ORDER — PREDNISONE 20 MG PO TABS: ORAL_TABLET | ORAL | 0 refills | Status: DC

## 2017-09-02 ENCOUNTER — Other Ambulatory Visit: Payer: Self-pay | Admitting: Internal Medicine

## 2017-10-03 ENCOUNTER — Telehealth: Payer: Self-pay | Admitting: *Deleted

## 2017-10-03 DIAGNOSIS — B3731 Acute candidiasis of vulva and vagina: Secondary | ICD-10-CM

## 2017-10-03 DIAGNOSIS — B373 Candidiasis of vulva and vagina: Secondary | ICD-10-CM

## 2017-10-03 MED ORDER — FLUCONAZOLE 150 MG PO TABS: ORAL_TABLET | ORAL | 0 refills | Status: DC

## 2017-10-03 NOTE — Telephone Encounter (Signed)
Patient called and states she completed her RX for Diflucan from her 07/31/2017 appointment. She continues to have a yeast infection.  Per Dr Melford Aase, a new RX has been sent for Diflucan 150 mg  and she should take 1 tablet 3 days a week x 4 weeks.  The patient is aware.

## 2017-10-10 DIAGNOSIS — I700 Atherosclerosis of aorta: Secondary | ICD-10-CM | POA: Insufficient documentation

## 2017-10-10 NOTE — Progress Notes (Signed)
Assessment and Plan:  Tracy Paul was seen today for abdominal pain.  Diagnoses and all orders for this visit:  Periumbilical abdominal pain Gastroenteritis vs ulcer - vague symptoms; recent CT negative; if she continues to experience ongoing pain may need to consider GI referral as she has multiple ongoing GI concerns -     CBC with Differential/Platelet -     BASIC METABOLIC PANEL WITH GFR -     Hepatic function panel -     Amylase -     H. pylori breath test -     pantoprazole (PROTONIX) 40 MG tablet; Take 1 tablet (40 mg total) by mouth daily. -     Urinalysis w microscopic + reflex cultur -     sucralfate (CARAFATE) 1 g tablet; Take 1 tablet (1 g total) by mouth 3 (three) times daily as needed. Please go to the ER if you have any severe AB pain, unable to hold down food/water, blood in stool or vomit, chest pain, shortness of breath, or any worsening symptoms.   Aortic atherosclerosis ([place]) Noted on review of CT from 08/01/2017 Control blood pressure, cholesterol, glucose, increase exercise.   Black stool -     POC Hemoccult Bld/Stl (3-Cd Home Screen); Future  Further disposition pending results of labs. Discussed med's effects and SE's.   Over 20 minutes of exam, counseling, chart review, and critical decision making was performed.   Future Appointments  Date Time Provider Tracy Paul  10/31/2017  9:15 AM Tracy Comber, NP [facility] None  03/05/2018  9:30 AM Tracy Pinto, MD [facility] None  07/31/2018 10:00 AM Tracy Pinto, MD [facility] None    ------------------------------------------------------------------------------------------------------------------   HPI Pulse (!) 56   Temp 99.1 F (37.3 C)   Ht 5\' 3"  (1.6 m)   Wt 164 lb (74.4 kg)   LMP  (LMP Unknown)   SpO2 99%   BMI 29.05 kg/m   66 y.o.female presents for periumbilical abdominal pain going on x 4 days with radiating pain epigastric/pelvic, sharp in character - - 4/10 up to 9/10 - pain worsens  intermittently - not associated with eating, movement, time of day. She has not taken anything for the pain, reports sometimes the pain does go away. She reports 1 episode of nausea and emesis. 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 She denies fever/chills, family members with similar symptoms, blood/purulence in BM, constipation, CP/SOB/wheezing, symptoms worse with exertion or improving with rest.   Colonoscopy 2015, Abdominal surgeries - roux-en-y 2014, cholecystectomy 2015, tubal ligation.  She has also been complaining of vague abdominal pain for several months (reports today's pain is separate and new) - CT abd/pelvis w/o contrast was obtained at the last visit and reviewed:   IMPRESSION: 1. No specific explanation for abdominal pain. 2. Widespread colonic diverticulosis. 3. 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  Past Medical History:  Diagnosis Date  . Allergic rhinitis   . Arthritis   . Cholecystitis, chronic   . Dysrhythmia    Hx bradycardia - hx normal cardiac cath x2 / ECHO 2009 EF 60% LAE MYOVIEW 3/12 EF 60% NO ISCHEMIA  . Hyperlipidemia   . Hypertension    no treatment after 120 lb weight loss  . Hypothyroidism   . Prediabetes   . Thyroid nodule      Allergies  Allergen Reactions  . Codeine Other (See Comments)    "puts me out of it".  Dysphoria.  Patient tolerates Tramadol without complications.  . Nabumetone     GI upset    Current Outpatient Medications on File Prior to Visit  Medication Sig  . acyclovir (ZOVIRAX) 400 MG tablet Take 1 tablet (400 mg total) by mouth daily.  . Ascorbic Acid (VITAMIN C) 500 MG CAPS Take by mouth daily.  Marland Kitchen aspirin 81 MG tablet Take 81 mg by mouth daily.  Marland Kitchen azithromycin (ZITHROMAX) 250 MG tablet Take  2 tablets (500 mg) on  Day 1,  followed by 1 tablet (250 mg) once daily on Days 2 through 5.  . Biotin 10 MG TABS Take 10,000 each by mouth daily.  . Calcium Citrate-Vitamin D (CALCIUM CITRATE + D3 PO) Take 600 mg by mouth daily.   . cyclobenzaprine (FLEXERIL) 10 MG tablet Take 1 tablet (10 mg total) by mouth 3 (three) times daily as needed for muscle spasms.  . diphenhydramine-acetaminophen (TYLENOL PM) 25-500 MG TABS tablet Take 1 tablet by mouth at bedtime as needed.  . Ferrous Sulfate (SLOW RELEASE IRON PO) Take by mouth daily.  . fish oil-omega-3 fatty acids 1000 MG capsule Take 1 g by mouth daily.  . fluconazole (DIFLUCAN) 150 MG tablet Take 1 tablet 3 days a  week  for yeast infection, for 4 weeks.  . fluticasone (FLONASE) 50 MCG/ACT nasal spray Place 2 sprays into both nostrils daily.  Marland Kitchen levothyroxine (SYNTHROID, LEVOTHROID) 75 MCG tablet TAKE ONE TABLET BY MOUTH ONCE DAILY  . Magnesium 250 MG TABS Take by mouth daily.  . Melatonin 10 MG TABS Take 1 tablet by mouth at bedtime.  . meloxicam (MOBIC) 15 MG tablet Take 1 tablet (15 mg total) by mouth daily.  Marland Kitchen OVER THE COUNTER MEDICATION Takes Allegra 25 mg at bedtime.  . potassium chloride (K-DUR) 10 MEQ tablet TAKE ONE TABLET BY MOUTH TWICE DAILY  . pravastatin (PRAVACHOL) 40 MG tablet TAKE ONE TABLET BY MOUTH ONCE DAILY  . predniSONE (DELTASONE) 20 MG tablet 1 tab 3 x day for 3 days, then 1 tab 2 x day for 3 days, then 1 tab 1 x day for 5 days  . promethazine-dextromethorphan (PROMETHAZINE-DM) 6.25-15 MG/5ML syrup Take 1 to 2 tsp enery 4 hours if needed for cough  . ranitidine (ZANTAC) 300 MG tablet Take 1 to 2 tablets daily for heartburn & reflux  . rOPINIRole (REQUIP) 0.5 MG tablet TAKE ONE-HALF (1/2) TABLET AT NIGHT. CAN INCREASE TO TAKE ONE TABLET, THEN MAY INCREASE UP TO 4 TABLETS AT NIGHT AS DIRECTED.  Marland Kitchen vitamin B-12 (CYANOCOBALAMIN) 1000 MCG tablet Take 1,000 mcg by mouth daily.   . Vitamin D, Ergocalciferol, (DRISDOL) 50000 units  CAPS capsule TAKE ONE CAPSULE BY MOUTH ONCE DAILY   No current facility-administered medications on file prior to visit.     ROS: Review of Systems  Constitutional: Negative for chills, diaphoresis, fever, malaise/fatigue and weight loss.  Eyes: Negative for blurred vision and double vision.  Respiratory: Negative for cough, sputum production, shortness of breath and wheezing.   Cardiovascular: Negative for chest pain, palpitations, orthopnea, claudication and leg swelling.  Gastrointestinal: Positive for abdominal pain, diarrhea (Consitent with baseline), nausea (Mild) and vomiting (Single episode). Negative for blood in stool, constipation, heartburn and melena.  Genitourinary: Negative for dysuria, flank pain, frequency, hematuria and urgency.  Musculoskeletal: Negative for joint pain and myalgias.  Skin: Negative for rash.  Neurological: Negative for dizziness, focal  weakness, weakness and headaches.  Endo/Heme/Allergies: Negative for polydipsia.  Psychiatric/Behavioral: Negative.     Physical Exam:  Pulse (!) 56   Temp 99.1 F (37.3 C)   Ht 5\' 3"  (1.6 m)   Wt 164 lb (74.4 kg)   LMP  (LMP Unknown)   SpO2 99%   BMI 29.05 kg/m   General Appearance: Well nourished, in no apparent distress. Eyes: PERRLA, EOMs, conjunctiva no swelling or erythema Sinuses: No Frontal/maxillary tenderness ENT/Mouth: Ext aud canals clear, TMs without erythema, bulging. No erythema, swelling, or exudate on post pharynx.  Tonsils not swollen or erythematous. Hearing normal.  Neck: Supple, thyroid normal.  Respiratory: Respiratory effort normal, BS equal bilaterally without rales, rhonchi, wheezing or stridor.  Cardio: RRR with no MRGs. Brisk peripheral pulses without edema.  Abdomen: Soft, + BS.  no guarding, rebound, palpable hernias or masses. She is mildly tender throughout lower quadrants, reports most tenderness with epigastric palpation. No notable tenderness upper L/R quadrants.  Lymphatics:  Non tender without lymphadenopathy.  Musculoskeletal: Full ROM, 5/5 strength, normal gait.  Skin: Warm, dry without rashes, lesions, ecchymosis.  Psych: Awake and oriented X 3, normal affect, Insight and Judgment appropriate.     Izora Ribas, NP 11:49 AM [facility]

## 2017-10-11 ENCOUNTER — Ambulatory Visit: Payer: Federal, State, Local not specified - PPO | Admitting: Adult Health

## 2017-10-11 ENCOUNTER — Encounter: Payer: Self-pay | Admitting: Adult Health

## 2017-10-11 VITALS — HR 56 | Temp 99.1°F | Ht 63.0 in | Wt 164.0 lb

## 2017-10-11 DIAGNOSIS — R1033 Periumbilical pain: Secondary | ICD-10-CM

## 2017-10-11 DIAGNOSIS — I700 Atherosclerosis of aorta: Secondary | ICD-10-CM | POA: Diagnosis not present

## 2017-10-11 DIAGNOSIS — K921 Melena: Secondary | ICD-10-CM | POA: Diagnosis not present

## 2017-10-11 MED ORDER — PANTOPRAZOLE SODIUM 40 MG PO TBEC: 40.0000 mg | DELAYED_RELEASE_TABLET | Freq: Every day | ORAL | 3 refills | Status: DC

## 2017-10-11 MED ORDER — SUCRALFATE 1 G PO TABS: 1.0000 g | ORAL_TABLET | Freq: Three times a day (TID) | ORAL | 1 refills | Status: DC | PRN

## 2017-10-11 NOTE — Patient Instructions (Signed)
  May dissolve carafate in water to help coat esophagus if difficult to swallow  Please go to the ER if you have any severe AB pain, unable to hold down food/water, blood in stool or vomit, chest pain, shortness of breath, or any worsening symptoms.    Gastritis, Adult Gastritis is inflammation of the stomach. There are two kinds of gastritis:  Acute gastritis. This kind develops suddenly.  Chronic gastritis. This kind lasts for a long time.  Gastritis happens when the lining of the stomach becomes weak or gets damaged. Without treatment, gastritis can lead to stomach bleeding and ulcers. What are the causes? This condition may be caused by:  An infection.  Drinking too much alcohol.  Certain medicines.  Having too much acid in the stomach.  A disease of the intestines or stomach.  Stress.  What are the signs or symptoms? Symptoms of this condition include:  Pain or a burning in the upper abdomen.  Nausea.  Vomiting.  An uncomfortable feeling of fullness after eating.  In some cases, there are no symptoms. How is this diagnosed? This condition may be diagnosed with:  A description of your symptoms.  A physical exam.  Tests. These can include: ? Blood tests. ? Stool tests. ? A test in which a thin, flexible instrument with a light and camera on the end is passed down the esophagus and into the stomach (upper endoscopy). ? A test in which a sample of tissue is taken for testing (biopsy).  How is this treated? This condition may be treated with medicines. If the condition is caused by a bacterial infection, you may be given antibiotic medicines. If it is caused by too much acid in the stomach, you may get medicines called H2 blockers, proton pump inhibitors, or antacids. Treatment may also involve stopping the use of certain medicines, such as aspirin, ibuprofen, or other nonsteroidal anti-inflammatory drugs (NSAIDs). Follow these instructions at home:  Take  over-the-counter and prescription medicines only as told by your health care provider.  If you were prescribed an antibiotic, take it as told by your health care provider. Do not stop taking the antibiotic even if you start to feel better.  Drink enough fluid to keep your urine clear or pale yellow.  Eat small, frequent meals instead of large meals. Contact a health care provider if:  Your symptoms get worse.  Your symptoms return after treatment. Get help right away if:  You vomit blood or material that looks like coffee grounds.  You have black or dark red stools.  You are unable to keep fluids down.  Your abdominal pain gets worse.  You have a fever.  You do not feel better after 1 week. This information is not intended to replace advice given to you by your health care provider. Make sure you discuss any questions you have with your health care provider. Document Released: 10/09/2001 Document Revised: 06/13/2016 Document Reviewed: 07/09/2015 Elsevier Interactive Patient Education  [employer].

## 2017-10-14 ENCOUNTER — Other Ambulatory Visit: Payer: Self-pay | Admitting: Adult Health

## 2017-10-14 ENCOUNTER — Telehealth: Payer: Self-pay

## 2017-10-14 ENCOUNTER — Encounter: Payer: Self-pay | Admitting: Adult Health

## 2017-10-14 DIAGNOSIS — K289 Gastrojejunal ulcer, unspecified as acute or chronic, without hemorrhage or perforation: Secondary | ICD-10-CM

## 2017-10-14 DIAGNOSIS — R1033 Periumbilical pain: Secondary | ICD-10-CM

## 2017-10-14 DIAGNOSIS — B9681 Helicobacter pylori [H. pylori] as the cause of diseases classified elsewhere: Secondary | ICD-10-CM

## 2017-10-14 HISTORY — DX: Helicobacter pylori (H. pylori) as the cause of diseases classified elsewhere: B96.81

## 2017-10-14 LAB — CBC WITH DIFFERENTIAL/PLATELET
BASOS PCT: 0.9 %
Basophils Absolute: 61 cells/uL (ref 0–200)
EOS PCT: 1.5 %
Eosinophils Absolute: 102 cells/uL (ref 15–500)
HCT: 34.5 % — ABNORMAL LOW (ref 35.0–45.0)
Hemoglobin: 11.6 g/dL — ABNORMAL LOW (ref 11.7–15.5)
Lymphs Abs: 3271 cells/uL (ref 850–3900)
MCH: 30.6 pg (ref 27.0–33.0)
MCHC: 33.6 g/dL (ref 32.0–36.0)
MCV: 91 fL (ref 80.0–100.0)
MPV: 10.9 fL (ref 7.5–12.5)
Monocytes Relative: 6.1 %
Neutro Abs: 2951 cells/uL (ref 1500–7800)
Neutrophils Relative %: 43.4 %
PLATELETS: 332 10*3/uL (ref 140–400)
RBC: 3.79 10*6/uL — AB (ref 3.80–5.10)
RDW: 12 % (ref 11.0–15.0)
TOTAL LYMPHOCYTE: 48.1 %
WBC: 6.8 10*3/uL (ref 3.8–10.8)
WBCMIX: 415 {cells}/uL (ref 200–950)

## 2017-10-14 LAB — URINALYSIS W MICROSCOPIC + REFLEX CULTURE
BACTERIA UA: NONE SEEN /HPF
Bilirubin Urine: NEGATIVE
GLUCOSE, UA: NEGATIVE
HYALINE CAST: NONE SEEN /LPF
Hgb urine dipstick: NEGATIVE
Ketones, ur: NEGATIVE
Nitrites, Initial: NEGATIVE
Protein, ur: NEGATIVE
Specific Gravity, Urine: 1.015 (ref 1.001–1.03)
pH: <=5 (ref 5.0–8.0)

## 2017-10-14 LAB — AMYLASE: AMYLASE: 43 U/L (ref 21–101)

## 2017-10-14 LAB — HEPATIC FUNCTION PANEL
AG Ratio: 1.6 (calc) (ref 1.0–2.5)
ALT: 19 U/L (ref 6–29)
AST: 24 U/L (ref 10–35)
Albumin: 3.9 g/dL (ref 3.6–5.1)
Alkaline phosphatase (APISO): 103 U/L (ref 33–130)
Bilirubin, Direct: 0.2 mg/dL (ref 0.0–0.2)
GLOBULIN: 2.4 g/dL (ref 1.9–3.7)
Indirect Bilirubin: 0.8 mg/dL (calc) (ref 0.2–1.2)
TOTAL PROTEIN: 6.3 g/dL (ref 6.1–8.1)
Total Bilirubin: 1 mg/dL (ref 0.2–1.2)

## 2017-10-14 LAB — URINE CULTURE
MICRO NUMBER:: 81412824
SPECIMEN QUALITY: ADEQUATE

## 2017-10-14 LAB — H. PYLORI BREATH TEST: H. pylori Breath Test: DETECTED — AB

## 2017-10-14 LAB — BASIC METABOLIC PANEL WITH GFR
BUN/Creatinine Ratio: 13 (calc) (ref 6–22)
BUN: 13 mg/dL (ref 7–25)
CALCIUM: 9 mg/dL (ref 8.6–10.4)
CHLORIDE: 105 mmol/L (ref 98–110)
CO2: 27 mmol/L (ref 20–32)
Creat: 1.04 mg/dL — ABNORMAL HIGH (ref 0.50–0.99)
GFR, Est African American: 65 mL/min/{1.73_m2} (ref >=60)
GFR, Est Non African American: 56 mL/min/{1.73_m2} — ABNORMAL LOW (ref >=60)
Glucose, Bld: 86 mg/dL (ref 65–99)
POTASSIUM: 4 mmol/L (ref 3.5–5.3)
Sodium: 141 mmol/L (ref 135–146)

## 2017-10-14 LAB — CULTURE INDICATED

## 2017-10-14 MED ORDER — AMOXICILLIN 500 MG PO TABS: 1000.0000 mg | ORAL_TABLET | Freq: Two times a day (BID) | ORAL | 0 refills | Status: AC

## 2017-10-14 MED ORDER — LEVOFLOXACIN 500 MG PO TABS
500.0000 mg | ORAL_TABLET | Freq: Every day | ORAL | 0 refills | Status: AC
Start: 2017-10-14 — End: 2017-10-28

## 2017-10-14 NOTE — Progress Notes (Signed)
H. Pylori + patient called to notify of treatment, and to increase PPI to BID. Patient does not tolerate bismuth; will send in triple therapy x14 days with levaquin and amoxicillin. Will follow up as scheduled in January and discuss progress and retesting for h. hylori.

## 2017-10-14 NOTE — Telephone Encounter (Signed)
Opened in error

## 2017-10-18 ENCOUNTER — Other Ambulatory Visit: Payer: Self-pay | Admitting: Internal Medicine

## 2017-10-30 DIAGNOSIS — E663 Overweight: Secondary | ICD-10-CM | POA: Insufficient documentation

## 2017-10-30 NOTE — Progress Notes (Signed)
FOLLOW UP  Assessment and Plan:   Diagnoses and all orders for this visit:  Gastrointestinal ulcer due to Helicobacter pylori Will restart triple therapy-amoxicillin, levaquin, protonix - due to intolerance of bismuth- followed by PPI taper.  Call office when off PPI and will schedule lab visit to follow up on H.pylori  Hypothyroidism, unspecified type continue medications the same: reminded to take on an empty stomach 30-82mins before food.  check TSH level  Hypertension Well controlled with current medications  Monitor blood pressure at home; patient to call if consistently greater than 130/80 Continue DASH diet.   Reminder to go to the ER if any CP, SOB, nausea, dizziness, severe HA, changes vision/speech, left arm numbness and tingling and jaw pain.  Cholesterol Currently at goal;  Continue low cholesterol diet and exercise.  Check lipid panel.   Other abnormal glucose Discussed risks associated with elevated glucose Continue diet and exercise.  Perform daily foot/skin check, notify office of any concerning changes.  Check A1C  Overweight Long discussion about weight loss, diet, and exercise Recommended diet heavy in fruits and veggies and low in animal meats, cheeses, and dairy products, appropriate calorie intake Discussed ideal weight for height Patient will work on increasing water, adding beans, add weight lifting Will follow up in 3 months  Vitamin D Def At goal at last visit; continue supplementation to maintain goal of 70-100 Defer Vit D level  Continue diet and meds as discussed. Further disposition pending results of labs. Discussed med's effects and SE's.   Over 30 minutes of exam, counseling, chart review, and critical decision making was performed.   Future Appointments  Date Time Provider [place]  03/05/2018  9:30 AM [name], MD [facility] None  07/31/2018 10:00 AM [name], MD [facility] None     ----------------------------------------------------------------------------------------------------------------------  HPI 68 y.o. female  presents for 3 month follow up on hypertension, cholesterol, glucose management, weight (s/p roux-en-y) and vitamin D deficiency. She is recently diagnosed with H.pylori + ulcer 12/17 with hx of GERD/hiatal hernia- tx by protonix BID, amoxicillin and levaquin therapy due to intolerance of bismuth. She reports her ulcer symptoms are doing much better but she never received levaquin. Will restart therapy will all agents.   BMI is Body mass index is 29.19 kg/m., she has been working on diet and exercise. Wt Readings from Last 3 Encounters:  10/31/17 164 lb 12.8 oz (74.8 kg)  10/11/17 164 lb (74.4 kg)  07/31/17 162 lb 3.2 oz (73.6 kg)   Her blood pressure has been controlled at home, today their BP is BP: 132/70  She does workout. She denies chest pain, shortness of breath, dizziness.   She is not on cholesterol medication and denies myalgias. Her cholesterol is at goal. The cholesterol last visit was:   Lab Results  Component Value Date   CHOL 157 07/31/2017   HDL 76 07/31/2017   LDLCALC 56 04/16/2017   TRIG 67 07/31/2017   CHOLHDL 2.1 07/31/2017    She has been working on diet and exercise for glucose management, and denies increased appetite, nausea, paresthesia of the feet, polydipsia, polyuria, visual disturbances, vomiting and weight loss. Last A1C in the office was:  Lab Results  Component Value Date   HGBA1C 5.4 07/31/2017   She is on thyroid medication. Her medication was not changed last visit.   Lab Results  Component Value Date   TSH 1.57 07/31/2017   Patient is on Vitamin D supplement.   Lab Results  Component Value  Date   VD25OH 60 07/31/2017        Current Medications:  Current Outpatient Medications on File Prior to Visit  Medication Sig  . acyclovir (ZOVIRAX) 400 MG tablet Take 1 tablet (400 mg total) by mouth  daily.  . Ascorbic Acid (VITAMIN C) 500 MG CAPS Take by mouth daily.  Marland Kitchen aspirin 81 MG tablet Take 81 mg by mouth daily.  . Biotin 10 MG TABS Take 10,000 each by mouth daily.  . Calcium Citrate-Vitamin D (CALCIUM CITRATE + D3 PO) Take 600 mg by mouth daily.   . cyclobenzaprine (FLEXERIL) 10 MG tablet Take 1 tablet (10 mg total) by mouth 3 (three) times daily as needed for muscle spasms.  . diphenhydramine-acetaminophen (TYLENOL PM) 25-500 MG TABS tablet Take 1 tablet by mouth at bedtime as needed.  . Ferrous Sulfate (SLOW RELEASE IRON PO) Take by mouth daily.  . fish oil-omega-3 fatty acids 1000 MG capsule Take 1 g by mouth daily.  . fluconazole (DIFLUCAN) 150 MG tablet Take 1 tablet 3 days a  week  for yeast infection, for 4 weeks.  . fluticasone (FLONASE) 50 MCG/ACT nasal spray Place 2 sprays into both nostrils daily.  Marland Kitchen levothyroxine (SYNTHROID, LEVOTHROID) 75 MCG tablet TAKE ONE TABLET BY MOUTH ONCE DAILY  . Magnesium 250 MG TABS Take by mouth daily.  . Melatonin 10 MG TABS Take 1 tablet by mouth at bedtime.  . meloxicam (MOBIC) 15 MG tablet Take 1 tablet (15 mg total) by mouth daily.  Marland Kitchen OVER THE COUNTER MEDICATION Takes Allegra 25 mg at bedtime.  . pantoprazole (PROTONIX) 40 MG tablet Take 1 tablet (40 mg total) by mouth daily.  . potassium chloride (K-DUR) 10 MEQ tablet TAKE ONE TABLET BY MOUTH TWICE DAILY  . pravastatin (PRAVACHOL) 40 MG tablet TAKE ONE TABLET BY MOUTH ONCE DAILY  . predniSONE (DELTASONE) 20 MG tablet 1 tab 3 x day for 3 days, then 1 tab 2 x day for 3 days, then 1 tab 1 x day for 5 days  . promethazine-dextromethorphan (PROMETHAZINE-DM) 6.25-15 MG/5ML syrup Take 1 to 2 tsp enery 4 hours if needed for cough  . ranitidine (ZANTAC) 300 MG tablet TAKE ONE TO TWO TABLETS BY MOUTH DAILY FOR  HEARTBURN & REFLUX  . rOPINIRole (REQUIP) 0.5 MG tablet TAKE ONE-HALF (1/2) TABLET AT NIGHT. CAN INCREASE TO TAKE ONE TABLET, THEN MAY INCREASE UP TO 4 TABLETS AT NIGHT AS DIRECTED.  Marland Kitchen  sucralfate (CARAFATE) 1 g tablet Take 1 tablet (1 g total) by mouth 3 (three) times daily as needed.  . vitamin B-12 (CYANOCOBALAMIN) 1000 MCG tablet Take 1,000 mcg by mouth daily.   . Vitamin D, Ergocalciferol, (DRISDOL) 50000 units CAPS capsule TAKE ONE CAPSULE BY MOUTH ONCE DAILY   No current facility-administered medications on file prior to visit.      Allergies:  Allergies  Allergen Reactions  . Codeine Other (See Comments)    "puts me out of it".  Dysphoria.  Patient tolerates Tramadol without complications.  . Nabumetone     GI upset     Medical History:  Past Medical History:  Diagnosis Date  . Allergic rhinitis   . Arthritis   . Cholecystitis, chronic   . Dysrhythmia    Hx bradycardia - hx normal cardiac cath x2 / ECHO 2009 EF 60% LAE MYOVIEW 3/12 EF 60% NO ISCHEMIA  . Hyperlipidemia   . Hypertension    no treatment after 120 lb weight loss  . Hypothyroidism   .  Prediabetes   . Thyroid nodule    Family history- Reviewed and unchanged Social history- Reviewed and unchanged   Review of Systems:  Review of Systems  Constitutional: Negative for malaise/fatigue and weight loss.  HENT: Negative for hearing loss and tinnitus.   Eyes: Negative for blurred vision and double vision.  Respiratory: Negative for cough, shortness of breath and wheezing.   Cardiovascular: Negative for chest pain, palpitations, orthopnea, claudication and leg swelling.  Gastrointestinal: Negative for abdominal pain, blood in stool, constipation, diarrhea, heartburn, melena, nausea and vomiting.  Genitourinary: Negative.   Musculoskeletal: Negative for joint pain and myalgias.  Skin: Negative for rash.  Neurological: Negative for dizziness, tingling, sensory change, weakness and headaches.  Endo/Heme/Allergies: Negative for polydipsia.  Psychiatric/Behavioral: Negative.  Negative for depression. The patient is not nervous/anxious and does not have insomnia.   All other systems reviewed and  are negative.     Physical Exam: BP 132/70   Pulse (!) 52   Temp (!) 97.5 F (36.4 C)   Ht 5\' 3"  (1.6 m)   Wt 164 lb 12.8 oz (74.8 kg)   LMP  (LMP Unknown)   SpO2 99%   BMI 29.19 kg/m  Wt Readings from Last 3 Encounters:  10/31/17 164 lb 12.8 oz (74.8 kg)  10/11/17 164 lb (74.4 kg)  07/31/17 162 lb 3.2 oz (73.6 kg)   General Appearance: Well nourished, in no apparent distress. Eyes: PERRLA, EOMs, conjunctiva no swelling or erythema Sinuses: No Frontal/maxillary tenderness ENT/Mouth: Ext aud canals clear, TMs without erythema, bulging. No erythema, swelling, or exudate on post pharynx.  Tonsils not swollen or erythematous. Hearing normal.  Neck: Supple, thyroid normal.  Respiratory: Respiratory effort normal, BS equal bilaterally without rales, rhonchi, wheezing or stridor.  Cardio: RRR with no MRGs. Brisk peripheral pulses without edema.  Abdomen: Soft, + BS.  Non tender, no guarding, rebound, hernias, masses. Lymphatics: Non tender without lymphadenopathy.  Musculoskeletal: Full ROM, 5/5 strength, Normal gait Skin: Warm, dry without rashes, lesions, ecchymosis.  Neuro: Cranial nerves intact. No cerebellar symptoms.  Psych: Awake and oriented X 3, normal affect, Insight and Judgment appropriate.    Izora Ribas, NP 9:29 AM [facility]

## 2017-10-31 ENCOUNTER — Encounter: Payer: Self-pay | Admitting: Adult Health

## 2017-10-31 ENCOUNTER — Ambulatory Visit: Payer: Federal, State, Local not specified - PPO | Admitting: Adult Health

## 2017-10-31 VITALS — BP 132/70 | HR 52 | Temp 97.5°F | Ht 63.0 in | Wt 164.8 lb

## 2017-10-31 DIAGNOSIS — E663 Overweight: Secondary | ICD-10-CM | POA: Diagnosis not present

## 2017-10-31 DIAGNOSIS — B9681 Helicobacter pylori [H. pylori] as the cause of diseases classified elsewhere: Secondary | ICD-10-CM | POA: Diagnosis not present

## 2017-10-31 DIAGNOSIS — E559 Vitamin D deficiency, unspecified: Secondary | ICD-10-CM

## 2017-10-31 DIAGNOSIS — R7309 Other abnormal glucose: Secondary | ICD-10-CM

## 2017-10-31 DIAGNOSIS — Z79899 Other long term (current) drug therapy: Secondary | ICD-10-CM

## 2017-10-31 DIAGNOSIS — K289 Gastrojejunal ulcer, unspecified as acute or chronic, without hemorrhage or perforation: Secondary | ICD-10-CM

## 2017-10-31 DIAGNOSIS — E039 Hypothyroidism, unspecified: Secondary | ICD-10-CM

## 2017-10-31 DIAGNOSIS — I10 Essential (primary) hypertension: Secondary | ICD-10-CM | POA: Diagnosis not present

## 2017-10-31 DIAGNOSIS — E782 Mixed hyperlipidemia: Secondary | ICD-10-CM | POA: Diagnosis not present

## 2017-10-31 MED ORDER — LEVOFLOXACIN 500 MG PO TABS: 500.0000 mg | ORAL_TABLET | Freq: Every day | ORAL | 0 refills | Status: AC

## 2017-10-31 MED ORDER — GABAPENTIN 300 MG PO CAPS: 300.0000 mg | ORAL_CAPSULE | Freq: Every evening | ORAL | 2 refills | Status: DC | PRN

## 2017-10-31 MED ORDER — AMOXICILLIN 500 MG PO CAPS: 500.0000 mg | ORAL_CAPSULE | Freq: Four times a day (QID) | ORAL | 0 refills | Status: AC

## 2017-10-31 NOTE — Patient Instructions (Signed)
I will resend in antibiotics for H. Pylori - once done, start PPI taper. Can continue with ranitidine instead. Message me when you are successfully off of protonix so we can schedule a lab visit 2 weeks after to recheck for h. pylori    GETTING OFF OF PPI's    Nexium/protonix/prilosec/Omeprazole/Dexilant/Aciphex are called PPI's, they are great at healing your stomach but should only be taken for a short period of time.     Recent studies have shown that taken for a long time they  can increase the risk of osteoporosis (weakening of your bones), pneumonia, low magnesium, restless legs, Cdiff (infection that causes diarrhea), DEMENTIA and most recently kidney damage / disease / insufficiency.     Due to this information we want to try to stop the PPI but if you try to stop it abruptly this can cause rebound acid and worsening symptoms.   So this is how we want you to get off the PPI: Generic is always fine!!  - Start taking the nexium/protonix/prilosec/PPI  every other day with  zantac (ranitidine) OR pepcid famotadine 2 x a day for 2-4 weeks - some people stay on this dosage and can not taper off further. Our main goal is to limit the dosage and amount you are taking so if you need to stay on this dose.   - then decrease the PPI to every 3 days while taking the zantac or pepcid 300mg  twice a day the other  days for 2-4  Weeks  - then you can try the zantac or pepcid 300mg  once at night or up to 2 x day as needed.  - you can continue on this once at night or stop all together  - Avoid alcohol, spicy foods, NSAIDS (aleve, ibuprofen) at this time. See foods below.   +++++++++++++++++++++++++++++++++++++++++++  Food Choices for Gastroesophageal Reflux Disease  When you have gastroesophageal reflux disease (GERD), the foods you eat and your eating habits are very important. Choosing the right foods can help ease the discomfort of GERD. WHAT GENERAL GUIDELINES DO I NEED TO FOLLOW?  Choose  fruits, vegetables, whole grains, low-fat dairy products, and low-fat meat, fish, and poultry.  Limit fats such as oils, salad dressings, butter, nuts, and avocado.  Keep a food diary to identify foods that cause symptoms.  Avoid foods that cause reflux. These may be different for different people.  Eat frequent small meals instead of three large meals each day.  Eat your meals slowly, in a relaxed setting.  Limit fried foods.  Cook foods using methods other than frying.  Avoid drinking alcohol.  Avoid drinking large amounts of liquids with your meals.  Avoid bending over or lying down until 2-3 hours after eating.   WHAT FOODS ARE NOT RECOMMENDED? The following are some foods and drinks that may worsen your symptoms:  Vegetables Tomatoes. Tomato juice. Tomato and spaghetti sauce. Chili peppers. Onion and garlic. Horseradish. Fruits Oranges, grapefruit, and lemon (fruit and juice). Meats High-fat meats, fish, and poultry. This includes hot dogs, ribs, ham, sausage, salami, and bacon. Dairy Whole milk and chocolate milk. Sour cream. Cream. Butter. Ice cream. Cream cheese.  Beverages Coffee and tea, with or without caffeine. Carbonated beverages or energy drinks. Condiments Hot sauce. Barbecue sauce.  Sweets/Desserts Chocolate and cocoa. Donuts. Peppermint and spearmint. Fats and Oils High-fat foods, including [place] fries and potato chips. Other Vinegar. Strong spices, such as black pepper, white pepper, red pepper, cayenne, curry powder, cloves, ginger, and chili powder.

## 2017-11-01 LAB — CBC WITH DIFFERENTIAL/PLATELET
BASOS ABS: 90 {cells}/uL (ref 0–200)
BASOS PCT: 1.8 %
EOS PCT: 4.6 %
Eosinophils Absolute: 230 cells/uL (ref 15–500)
HEMATOCRIT: 36.1 % (ref 35.0–45.0)
HEMOGLOBIN: 11.9 g/dL (ref 11.7–15.5)
LYMPHS ABS: 2600 {cells}/uL (ref 850–3900)
MCH: 30.1 pg (ref 27.0–33.0)
MCHC: 33 g/dL (ref 32.0–36.0)
MCV: 91.4 fL (ref 80.0–100.0)
MONOS PCT: 8 %
MPV: 11.4 fL (ref 7.5–12.5)
NEUTROS ABS: 1680 {cells}/uL (ref 1500–7800)
Neutrophils Relative %: 33.6 %
Platelets: 246 10*3/uL (ref 140–400)
RBC: 3.95 10*6/uL (ref 3.80–5.10)
RDW: 12 % (ref 11.0–15.0)
Total Lymphocyte: 52 %
WBC mixed population: 400 cells/uL (ref 200–950)
WBC: 5 10*3/uL (ref 3.8–10.8)

## 2017-11-01 LAB — BASIC METABOLIC PANEL WITH GFR
BUN: 9 mg/dL (ref 7–25)
CALCIUM: 9.3 mg/dL (ref 8.6–10.4)
CO2: 29 mmol/L (ref 20–32)
CREATININE: 0.97 mg/dL (ref 0.50–0.99)
Chloride: 106 mmol/L (ref 98–110)
GFR, EST NON AFRICAN AMERICAN: 61 mL/min/{1.73_m2} (ref >=60)
GFR, Est African American: 71 mL/min/{1.73_m2} (ref >=60)
GLUCOSE: 92 mg/dL (ref 65–99)
Potassium: 5.2 mmol/L (ref 3.5–5.3)
SODIUM: 142 mmol/L (ref 135–146)

## 2017-11-01 LAB — HEPATIC FUNCTION PANEL
AG RATIO: 1.9 (calc) (ref 1.0–2.5)
ALBUMIN MSPROF: 4.1 g/dL (ref 3.6–5.1)
ALT: 21 U/L (ref 6–29)
AST: 28 U/L (ref 10–35)
Alkaline phosphatase (APISO): 99 U/L (ref 33–130)
BILIRUBIN TOTAL: 1.3 mg/dL — AB (ref 0.2–1.2)
Bilirubin, Direct: 0.3 mg/dL — ABNORMAL HIGH (ref 0.0–0.2)
GLOBULIN: 2.2 g/dL (ref 1.9–3.7)
Indirect Bilirubin: 1 mg/dL (calc) (ref 0.2–1.2)
TOTAL PROTEIN: 6.3 g/dL (ref 6.1–8.1)

## 2017-11-01 LAB — LIPID PANEL
CHOL/HDL RATIO: 2.2 (calc) (ref <5.0)
Cholesterol: 166 mg/dL (ref <200)
HDL: 75 mg/dL (ref >50)
LDL CHOLESTEROL (CALC): 78 mg/dL
NON-HDL CHOLESTEROL (CALC): 91 mg/dL (ref <130)
TRIGLYCERIDES: 54 mg/dL (ref <150)

## 2017-11-01 LAB — TSH: TSH: 1.51 m[IU]/L (ref 0.40–4.50)

## 2017-11-04 ENCOUNTER — Telehealth: Payer: Self-pay

## 2017-11-04 ENCOUNTER — Other Ambulatory Visit: Payer: Self-pay | Admitting: Adult Health

## 2017-11-04 MED ORDER — PREDNISONE 20 MG PO TABS: ORAL_TABLET | ORAL | 0 refills | Status: DC

## 2017-11-04 NOTE — Telephone Encounter (Signed)
Patient was seen last week for sinus issues. Patient requesting another rx. Patient has headaches, dizziness, and sinus pressure. Patient states that it takes 2 z-paks for her to get over it. Would like it sent to [facility] in [place].

## 2017-11-04 NOTE — Telephone Encounter (Signed)
Patient aware of note and Prednisone being available to be picked up at the pharmacy

## 2017-11-04 NOTE — Telephone Encounter (Signed)
Please let the patient know that she is on very strong antibiotics for her stomach ulcer that should dover URI/sinus issues as well, not appropriate at this time to add back zpak.  I can resend in prednisone for sinus pressure, she can do saline flushes, make sure on daily allergy medication, flonase daily, guaifenesin/mucinex to help loosen secretions.

## 2017-11-20 ENCOUNTER — Other Ambulatory Visit: Payer: Self-pay

## 2017-11-20 DIAGNOSIS — K921 Melena: Secondary | ICD-10-CM

## 2017-11-20 DIAGNOSIS — Z1212 Encounter for screening for malignant neoplasm of rectum: Secondary | ICD-10-CM | POA: Diagnosis not present

## 2017-11-20 DIAGNOSIS — R1033 Periumbilical pain: Secondary | ICD-10-CM

## 2017-11-20 LAB — POC HEMOCCULT BLD/STL (HOME/3-CARD/SCREEN)
Card #2 Fecal Occult Blod, POC: NEGATIVE
Card #3 Fecal Occult Blood, POC: NEGATIVE
Fecal Occult Blood, POC: NEGATIVE

## 2017-12-02 ENCOUNTER — Other Ambulatory Visit: Payer: Self-pay | Admitting: Internal Medicine

## 2018-01-15 ENCOUNTER — Ambulatory Visit: Payer: Federal, State, Local not specified - PPO | Admitting: Physician Assistant

## 2018-01-15 ENCOUNTER — Encounter: Payer: Self-pay | Admitting: Physician Assistant

## 2018-01-15 ENCOUNTER — Encounter: Payer: Self-pay | Admitting: Gastroenterology

## 2018-01-15 VITALS — BP 124/72 | HR 67 | Temp 97.6°F | Resp 16 | Ht 63.0 in | Wt 175.0 lb

## 2018-01-15 DIAGNOSIS — R1013 Epigastric pain: Secondary | ICD-10-CM | POA: Diagnosis not present

## 2018-01-15 DIAGNOSIS — K289 Gastrojejunal ulcer, unspecified as acute or chronic, without hemorrhage or perforation: Secondary | ICD-10-CM | POA: Diagnosis not present

## 2018-01-15 DIAGNOSIS — B9681 Helicobacter pylori [H. pylori] as the cause of diseases classified elsewhere: Secondary | ICD-10-CM

## 2018-01-15 LAB — CBC WITH DIFFERENTIAL/PLATELET
Basophils Absolute: 92 cells/uL (ref 0–200)
Basophils Relative: 1.7 %
EOS ABS: 513 {cells}/uL — AB (ref 15–500)
Eosinophils Relative: 9.5 %
HCT: 36.2 % (ref 35.0–45.0)
HEMOGLOBIN: 11.9 g/dL (ref 11.7–15.5)
LYMPHS ABS: 2403 {cells}/uL (ref 850–3900)
MCH: 29.9 pg (ref 27.0–33.0)
MCHC: 32.9 g/dL (ref 32.0–36.0)
MCV: 91 fL (ref 80.0–100.0)
MONOS PCT: 9.2 %
MPV: 11.1 fL (ref 7.5–12.5)
Neutro Abs: 1895 cells/uL (ref 1500–7800)
Neutrophils Relative %: 35.1 %
Platelets: 294 10*3/uL (ref 140–400)
RBC: 3.98 10*6/uL (ref 3.80–5.10)
RDW: 12.5 % (ref 11.0–15.0)
Total Lymphocyte: 44.5 %
WBC: 5.4 10*3/uL (ref 3.8–10.8)
WBCMIX: 497 {cells}/uL (ref 200–950)

## 2018-01-15 LAB — BASIC METABOLIC PANEL WITH GFR
BUN / CREAT RATIO: 7 (calc) (ref 6–22)
BUN: 8 mg/dL (ref 7–25)
CO2: 28 mmol/L (ref 20–32)
Calcium: 9.1 mg/dL (ref 8.6–10.4)
Chloride: 107 mmol/L (ref 98–110)
Creat: 1.07 mg/dL — ABNORMAL HIGH (ref 0.50–0.99)
GFR, Est African American: 63 mL/min/{1.73_m2} (ref >=60)
GFR, Est Non African American: 54 mL/min/{1.73_m2} — ABNORMAL LOW (ref >=60)
GLUCOSE: 89 mg/dL (ref 65–99)
Potassium: 4.2 mmol/L (ref 3.5–5.3)
Sodium: 140 mmol/L (ref 135–146)

## 2018-01-15 LAB — HEPATIC FUNCTION PANEL
AG Ratio: 1.7 (calc) (ref 1.0–2.5)
ALT: 19 U/L (ref 6–29)
AST: 24 U/L (ref 10–35)
Albumin: 4 g/dL (ref 3.6–5.1)
Alkaline phosphatase (APISO): 99 U/L (ref 33–130)
BILIRUBIN INDIRECT: 1.2 mg/dL (ref 0.2–1.2)
Bilirubin, Direct: 0.3 mg/dL — ABNORMAL HIGH (ref 0.0–0.2)
GLOBULIN: 2.4 g/dL (ref 1.9–3.7)
TOTAL PROTEIN: 6.4 g/dL (ref 6.1–8.1)
Total Bilirubin: 1.5 mg/dL — ABNORMAL HIGH (ref 0.2–1.2)

## 2018-01-15 MED ORDER — PANTOPRAZOLE SODIUM 40 MG PO TBEC: 40.0000 mg | DELAYED_RELEASE_TABLET | Freq: Two times a day (BID) | ORAL | 1 refills | Status: DC

## 2018-01-15 NOTE — Progress Notes (Signed)
Subjective:    Patient ID: Tracy Paul, female    DOB: 1951-06-24, 67 y.o.   MRN: [id number]  HPI 67 y.o. WF with history of GI pain, had CT AB 08/01/2017 normal, had recent + Hpylori and received treatment with levaquin, amoxicillin, PPI x 2 weeks in Dec, presents with stomach pain.  Tracy Paul continues to have similar pain, epigastric pain dull ache but at times sharp, comes and goes, no radiation to back. Some nausea right after eating, has vomiting a handful of times right after eating. No fever, chills, no blood in vomit, blood in stool, black stool, constipation/diarrhea.   Tracy Paul is NOT on protonix or mobic at this time.    Colonoscopy 2015, Abdominal surgeries - roux-en-y 2014, cholecystectomy 2015, tubal ligation.  Blood pressure 124/72, pulse 67, temperature 97.6 F (36.4 C), resp. rate 16, height 5\' 3"  (1.6 m), weight 175 lb (79.4 kg), SpO2 99 %.  Medications Current Outpatient Medications on File Prior to Visit  Medication Sig  . acyclovir (ZOVIRAX) 400 MG tablet Take 1 tablet (400 mg total) by mouth daily.  . Ascorbic Acid (VITAMIN C) 500 MG CAPS Take by mouth daily.  Marland Kitchen aspirin 81 MG tablet Take 81 mg by mouth daily.  . Biotin 10 MG TABS Take 10,000 each by mouth daily.  . Calcium Citrate-Vitamin D (CALCIUM CITRATE + D3 PO) Take 600 mg by mouth daily.   . cyclobenzaprine (FLEXERIL) 10 MG tablet Take 1 tablet (10 mg total) by mouth 3 (three) times daily as needed for muscle spasms.  . diphenhydramine-acetaminophen (TYLENOL PM) 25-500 MG TABS tablet Take 1 tablet by mouth at bedtime as needed.  . Ferrous Sulfate (SLOW RELEASE IRON PO) Take by mouth daily.  . fish oil-omega-3 fatty acids 1000 MG capsule Take 1 g by mouth daily.  . fluconazole (DIFLUCAN) 150 MG tablet Take 1 tablet 3 days a  week  for yeast infection, for 4 weeks.  . fluticasone (FLONASE) 50 MCG/ACT nasal spray Place 2 sprays into both nostrils daily.  Marland Kitchen gabapentin (NEURONTIN) 300 MG capsule Take 1 capsule (300 mg  total) by mouth at bedtime as needed.  Marland Kitchen levothyroxine (SYNTHROID, LEVOTHROID) 75 MCG tablet TAKE ONE TABLET BY MOUTH ONCE DAILY  . Magnesium 250 MG TABS Take by mouth daily.  . Melatonin 10 MG TABS Take 1 tablet by mouth at bedtime.  . meloxicam (MOBIC) 15 MG tablet Take 1 tablet (15 mg total) by mouth daily.  Marland Kitchen OVER THE COUNTER MEDICATION Takes Allegra 25 mg at bedtime.  . pantoprazole (PROTONIX) 40 MG tablet Take 1 tablet (40 mg total) by mouth daily.  . potassium chloride (K-DUR) 10 MEQ tablet TAKE ONE TABLET BY MOUTH TWICE DAILY  . pravastatin (PRAVACHOL) 40 MG tablet TAKE ONE TABLET BY MOUTH ONCE DAILY  . ranitidine (ZANTAC) 300 MG tablet TAKE ONE TO TWO TABLETS BY MOUTH DAILY FOR  HEARTBURN & REFLUX  . rOPINIRole (REQUIP) 0.5 MG tablet TAKE ONE-HALF (1/2) TABLET AT NIGHT. CAN INCREASE TO TAKE ONE TABLET, THEN MAY INCREASE UP TO 4 TABLETS AT NIGHT AS DIRECTED.  Marland Kitchen sucralfate (CARAFATE) 1 g tablet Take 1 tablet (1 g total) by mouth 3 (three) times daily as needed.  . vitamin B-12 (CYANOCOBALAMIN) 1000 MCG tablet Take 1,000 mcg by mouth daily.   . Vitamin D, Ergocalciferol, (DRISDOL) 50000 units CAPS capsule TAKE ONE CAPSULE BY MOUTH ONCE DAILY   No current facility-administered medications on file prior to visit.     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  Review of Systems See HPI    Objective:   Physical Exam  Constitutional: Tracy Paul is oriented to person, place, and time. Tracy Paul appears well-developed and well-nourished.  HENT:  Head: Normocephalic and atraumatic.  Right Ear: External ear  normal.  Left Ear: External ear normal.  Mouth/Throat: Oropharynx is clear and moist.  Eyes: Conjunctivae and EOM are normal. Pupils are equal, round, and reactive to light.  Neck: Normal range of motion. Neck supple. No thyromegaly present.  Cardiovascular: Normal rate, regular rhythm and normal heart sounds. Exam reveals no gallop and no friction rub.  No murmur heard. Pulmonary/Chest: Effort normal and breath sounds normal. No respiratory distress. Tracy Paul has no wheezes.  Abdominal: Soft. Bowel sounds are normal. Tracy Paul exhibits no shifting dullness, no distension, no abdominal bruit, no pulsatile midline mass and no mass. There is no hepatosplenomegaly. There is tenderness in the epigastric area. There is no rigidity, no rebound, no guarding, no CVA tenderness, no tenderness at McBurney's point and negative Murphy's sign. No hernia.  Musculoskeletal: Normal range of motion.  Lymphadenopathy:    Tracy Paul has no cervical adenopathy.  Neurological: Tracy Paul is alert and oriented to person, place, and time.  Skin: Skin is warm and dry.  Psychiatric: Tracy Paul has a normal mood and affect.        Assessment & Plan:  Tracy Paul was seen today for acute visit.  Gastrointestinal ulcer due to Helicobacter pylori/Epigastric pain -     Ambulatory referral to Gastroenterology -     CBC with Differential/Platelet -     BASIC METABOLIC PANEL WITH GFR -     Hepatic function panel -     pantoprazole (PROTONIX) 40 MG tablet; Take 1 tablet (40 mg total) by mouth 2 (two) times daily before a meal. Will do protonix 2 x a day until see GI  questionable if did not eradicate Hpylori versus non healing ulcer- will refer for EGD   Please go to the ER if you have any severe AB pain, unable to hold down food/water, blood in stool or vomit, chest pain, shortness of breath, or any worsening symptoms.

## 2018-01-15 NOTE — Patient Instructions (Signed)
Peptic Ulcer  A peptic ulcer is a sore in the lining of the esophagus (esophageal ulcer), the stomach (gastric ulcer), or the first part of the small intestine (duodenal ulcer). The ulcer causes gradual wearing away (erosion) into the deeper tissue.  What are the causes?  Normally, the lining of the stomach and the small intestine protects itself from the acid that digests food. The protective lining can be damaged by:   An infection caused by a germ (bacterium) called Helicobacter pylori or H. pylori.   Regular use of NSAIDs, such as ibuprofen or aspirin.   Rare tumors in the stomach, small intestine, or pancreas (Zollinger-Ellison syndrome).    What increases the risk?  The following factors may make you more likely to develop this condition:   Smoking.   Having a family history of ulcer disease.    What are the signs or symptoms?  Symptoms of this condition include:   Burning pain or gnawing in the area between the chest and the belly button. The pain may be worse on an empty stomach and at night.   Heartburn.   Nausea and vomiting.   Bloating.    If the ulcer results in bleeding, it can cause:   Black, tarry stools.   Vomiting of bright red blood.   Vomiting of material that looks like coffee grounds.    How is this diagnosed?  This condition may be diagnosed based on:   Medical history and physical exam.   Various tests or procedures, such as:  ? Blood tests, stool tests, or breath tests to check for the H. pylori bacterium.  ? An X-ray exam (upper gastrointestinal series) of the esophagus, stomach, and small intestine.  ? Upper endoscopy. The health care provider examines the esophagus, stomach, and small intestine using a small flexible tube that has a video camera at the end.  ? Biopsy. A tissue sample is removed to be examined under a microscope.    How is this treated?  Treatment for this condition may include:   Eliminating the cause of the ulcer, such as smoking or the use of NSAIDs or  alcohol.   Medicines to reduce the amount of acid in your digestive tract.   Antibiotic medicines, if the ulcer is caused by the H. pylori bacterium.   An upper endoscopy to treat a bleeding ulcer.   Surgery, if the bleeding is severe or if the ulcer created a hole somewhere in the digestive system.    Follow these instructions at home:   Avoid alcohol and caffeine.   Do not use any tobacco products, such as cigarettes, chewing tobacco, and e-cigarettes. If you need help quitting, ask your health care provider.   Take over-the-counter and prescription medicines only as told by your health care provider. Do not use over-the-counter medicines in place of prescription medicines unless your health care provider approves.   Keep all follow-up visits as told by your health care provider. This is important.  Contact a health care provider if:   Your symptoms do not improve within 7 days of starting treatment.   You have ongoing indigestion or heartburn.  Get help right away if:   You have sudden, sharp, or persistent pain in your abdomen.   You have bloody or dark black, tarry stools.   You vomit blood or material that looks like coffee grounds.   You become light-headed or you feel faint.   You become weak.   You become sweaty or clammy.    This information is not intended to replace advice given to you by your health care provider. Make sure you discuss any questions you have with your health care provider.  Document Released: 10/12/2000 Document Revised: 03/19/2016 Document Reviewed: 07/16/2015  Elsevier Interactive Patient Education  2018 Elsevier Inc[employer]

## 2018-02-05 ENCOUNTER — Other Ambulatory Visit: Payer: Self-pay

## 2018-02-05 MED ORDER — ROPINIROLE HCL 0.5 MG PO TABS: ORAL_TABLET | ORAL | 1 refills | Status: DC

## 2018-02-27 ENCOUNTER — Encounter: Payer: Self-pay | Admitting: Gastroenterology

## 2018-02-27 ENCOUNTER — Ambulatory Visit: Payer: Federal, State, Local not specified - PPO | Admitting: Gastroenterology

## 2018-02-27 VITALS — BP 130/80 | HR 62 | Ht 64.0 in | Wt 178.2 lb

## 2018-02-27 DIAGNOSIS — R1013 Epigastric pain: Secondary | ICD-10-CM

## 2018-02-27 DIAGNOSIS — Z1211 Encounter for screening for malignant neoplasm of colon: Secondary | ICD-10-CM | POA: Diagnosis not present

## 2018-02-27 DIAGNOSIS — R1310 Dysphagia, unspecified: Secondary | ICD-10-CM

## 2018-02-27 DIAGNOSIS — A048 Other specified bacterial intestinal infections: Secondary | ICD-10-CM

## 2018-02-27 DIAGNOSIS — K219 Gastro-esophageal reflux disease without esophagitis: Secondary | ICD-10-CM | POA: Diagnosis not present

## 2018-02-27 MED ORDER — OMEPRAZOLE 20 MG PO CPDR: 20.0000 mg | DELAYED_RELEASE_CAPSULE | Freq: Every day | ORAL | 3 refills | Status: DC

## 2018-02-27 MED ORDER — SUPREP BOWEL PREP KIT 17.5-3.13-1.6 GM/177ML PO SOLN: 1.0000 | ORAL | 0 refills | Status: DC

## 2018-02-27 NOTE — Patient Instructions (Addendum)
If you are age 66 or older, your body mass index should be between 23-30. Your Body mass index is 30.59 kg/m. If this is out of the aforementioned range listed, please consider follow up with your Primary Care Provider.  If you are age 37 or younger, your body mass index should be between 19-25. Your Body mass index is 30.59 kg/m. If this is out of the aformentioned range listed, please consider follow up with your Primary Care Provider.   You have been scheduled for an endoscopy. Please follow written instructions given to you at your visit today. If you use inhalers (even only as needed), please bring them with you on the day of your procedure. Your physician has requested that you go to www.startemmi.com and enter the access code given to you at your visit today. This web site gives a general overview about your procedure. However, you should still follow specific instructions given to you by our office regarding your preparation for the procedure.  You have been scheduled for a colonoscopy. Please follow written instructions given to you at your visit today.  Please pick up your prep supplies at the pharmacy within the next 1-3 days. If you use inhalers (even only as needed), please bring them with you on the day of your procedure. Your physician has requested that you go to www.startemmi.com and enter the access code given to you at your visit today. This web site gives a general overview about your procedure. However, you should still follow specific instructions given to you by our office regarding your preparation for the procedure.   We have sent the following medications to your pharmacy for you to pick up at your convenience: Omeprazole 20 mg once daily. Suprep  Thank you for choosing [facility]  Dr Silverio Decamp

## 2018-02-27 NOTE — Progress Notes (Signed)
Tracy Paul    [id number]    02-Jul-1951  Primary Care Physician:McKeown, Gwyndolyn Saxon, MD  Referring Physician: Vicie Mutters, PA-C [address] [place], [place] [address]  Chief complaint: GERD HPI: 67 year old female with history of gastric bypass, status post cholecystectomy, recently treated for H. pylori is here with complaints of epigastric abdominal pain. She had positive H. pylori breath test December 2018, prior to H. pylori treatment she was having severe epigastric abdominal pain.  It has improved to some extent after she completed the antibiotics but she continues to have epigastric discomfort Difficulty swallowing with both liquids and solids. Sometimes chokes on saliva.  She has heartburn almost every day, currently taking Zantac as needed Denies any nausea, vomiting, abdominal pain, melena or bright red blood per rectum  Colonoscopy in 2004 by Dr. Amedeo Plenty was normal patient thinks she may have had one in 2009 that was normal as well, report not available.  Outpatient Encounter Medications as of 02/27/2018  Medication Sig  . acyclovir (ZOVIRAX) 400 MG tablet Take 1 tablet (400 mg total) by mouth daily.  . Ascorbic Acid (VITAMIN C) 500 MG CAPS Take by mouth daily.  Marland Kitchen aspirin 81 MG tablet Take 81 mg by mouth daily.  . Biotin 10 MG TABS Take 10,000 each by mouth daily.  . Calcium Citrate-Vitamin D (CALCIUM CITRATE + D3 PO) Take 600 mg by mouth daily.   . cyclobenzaprine (FLEXERIL) 10 MG tablet Take 1 tablet (10 mg total) by mouth 3 (three) times daily as needed for muscle spasms.  . diphenhydramine-acetaminophen (TYLENOL PM) 25-500 MG TABS tablet Take 1 tablet by mouth at bedtime as needed.  . Ferrous Sulfate (SLOW RELEASE IRON PO) Take by mouth daily.  . fish oil-omega-3 fatty acids 1000 MG capsule Take 1 g by mouth daily.  . fluticasone (FLONASE) 50 MCG/ACT nasal spray Place 2 sprays into both nostrils daily.  Marland Kitchen gabapentin (NEURONTIN) 300 MG  capsule Take 1 capsule (300 mg total) by mouth at bedtime as needed.  Marland Kitchen levothyroxine (SYNTHROID, LEVOTHROID) 75 MCG tablet TAKE ONE TABLET BY MOUTH ONCE DAILY  . Magnesium 250 MG TABS Take by mouth daily.  . Melatonin 10 MG TABS Take 1 tablet by mouth at bedtime.  . meloxicam (MOBIC) 15 MG tablet Take 1 tablet (15 mg total) by mouth daily.  Marland Kitchen OVER THE COUNTER MEDICATION Takes Allegra 25 mg at bedtime.  . pantoprazole (PROTONIX) 40 MG tablet Take 1 tablet (40 mg total) by mouth 2 (two) times daily before a meal.  . potassium chloride (K-DUR) 10 MEQ tablet TAKE ONE TABLET BY MOUTH TWICE DAILY  . pravastatin (PRAVACHOL) 40 MG tablet TAKE ONE TABLET BY MOUTH ONCE DAILY  . ranitidine (ZANTAC) 300 MG tablet TAKE ONE TO TWO TABLETS BY MOUTH DAILY FOR  HEARTBURN & REFLUX  . rOPINIRole (REQUIP) 0.5 MG tablet TAKE ONE-HALF (1/2) TABLET AT NIGHT. CAN INCREASE TO TAKE ONE TABLET, THEN MAY INCREASE UP TO 4 TABLETS AT NIGHT AS DIRECTED.  Marland Kitchen sucralfate (CARAFATE) 1 g tablet Take 1 tablet (1 g total) by mouth 3 (three) times daily as needed.  . vitamin B-12 (CYANOCOBALAMIN) 1000 MCG tablet Take 1,000 mcg by mouth daily.   . Vitamin D, Ergocalciferol, (DRISDOL) 50000 units CAPS capsule TAKE ONE CAPSULE BY MOUTH ONCE DAILY  . [DISCONTINUED] fluconazole (DIFLUCAN) 150 MG tablet Take 1 tablet 3 days a  week  for yeast infection, for 4 weeks. (Patient not taking:  Reported on 02/27/2018)   No facility-administered encounter medications on file as of 02/27/2018.     Allergies as of 02/27/2018 - Review Complete 02/27/2018  Allergen Reaction Noted  . Codeine Other (See Comments)   . Nabumetone  11/29/2013    Past Medical History:  Diagnosis Date  . Allergic rhinitis   . Arthritis   . Cholecystitis, chronic   . Dysrhythmia    Hx bradycardia - hx normal cardiac cath x2 / ECHO 2009 EF 60% LAE MYOVIEW 3/12 EF 60% NO ISCHEMIA  . Hyperlipidemia   . Hypertension    no treatment after 120 lb weight loss  .  Hypothyroidism   . Prediabetes   . Thyroid nodule     Past Surgical History:  Procedure Laterality Date  . Bilateral carpal tunnel surgery    . BREATH TEK H PYLORI  07/01/2012   Procedure: BREATH TEK H PYLORI;  Surgeon: Edward Jolly, MD;  Location: [facility];  Service: General;  Laterality: N/A;  . CHOLECYSTECTOMY N/A 11/27/2013   Procedure: LAPAROSCOPIC CHOLECYSTECTOMY WITH INTRAOPERATIVE CHOLANGIOGRAM;  Surgeon: Pedro Earls, MD;  Location: [facility];  Service: General;  Laterality: N/A;  . GASTRIC ROUX-EN-Y N/A 12/16/2012   Procedure: LAPAROSCOPIC ROUX-EN-Y GASTRIC BYPASS WITH UPPER ENDOSCOPY;  Surgeon: Pedro Earls, MD;  Location: [facility];  Service: General;  Laterality: N/A;  Gastric Bypass  . NASAL SINUS SURGERY    . REPLACEMENT TOTAL KNEE BILATERAL     bilat  . TUBAL LIGATION      Family History  Problem Relation Age of Onset  . Breast cancer Mother   . Cancer Mother        breast cancer  . Anuerysm Mother        brain anuerysm  . Heart attack Father 38  . Heart attack Sister 25    Social History   Socioeconomic History  . Marital status: Married    Spouse name: Herbie Baltimore  . Number of children: 2  . Years of education: Not on file  . Highest education level: Not on file  Occupational History  . Occupation: Surveyor, quantity: [employer]  . Financial resource strain: Not on file  . Food insecurity:    Worry: Not on file    Inability: Not on file  . Transportation needs:    Medical: Not on file    Non-medical: Not on file  Tobacco Use  . Smoking status: Former Smoker    Packs/day: 0.80    Years: 10.00    Pack years: 8.00    Types: Cigarettes    Last attempt to quit: 10/30/1979    Years since quitting: 38.3  . Smokeless tobacco: Never Used  Substance and Sexual Activity  . Alcohol use: No  . Drug use: No  . Sexual activity: Not on file  Lifestyle  . Physical activity:    Days per week: Not on file    Minutes per session:  Not on file  . Stress: Not on file  Relationships  . Social connections:    Talks on phone: Not on file    Gets together: Not on file    Attends religious service: Not on file    Active member of club or organization: Not on file    Attends meetings of clubs or organizations: Not on file    Relationship status: Not on file  . Intimate partner violence:    Fear of current or ex partner: Not on  file    Emotionally abused: Not on file    Physically abused: Not on file    Forced sexual activity: Not on file  Other Topics Concern  . Not on file  Social History Narrative  . Not on file      Review of systems: Review of Systems  Constitutional: Negative for fever and chills.  HENT: Positive for allergies, sinus trouble Eyes: Negative for blurred vision.  Respiratory: Negative for cough, shortness of breath and wheezing.   Cardiovascular: Negative for chest pain and palpitations.  Gastrointestinal: as per HPI Genitourinary: Negative for dysuria, urgency, frequency and hematuria.  Musculoskeletal: Positive for myalgias, back pain and joint pain.  Skin: Negative for itching and rash.  Neurological: Negative for dizziness, tremors, focal weakness, seizures and loss of consciousness.  Endo/Heme/Allergies: Positive for seasonal allergies.  Psychiatric/Behavioral: Negative for depression, suicidal ideas and hallucinations.  All other systems reviewed and are negative.   Physical Exam: Vitals:   02/27/18 0932  BP: 130/80  Pulse: 62   Body mass index is 30.59 kg/m. Gen:      No acute distress HEENT:  EOMI, sclera anicteric Neck:     No masses; no thyromegaly Lungs:    Clear to auscultation bilaterally; normal respiratory effort CV:         Regular rate and rhythm; no murmurs Abd:      + bowel sounds; soft, non-tender; no palpable masses, no distension Ext:    No edema; adequate peripheral perfusion Skin:      Warm and dry; no rash Neuro: alert and oriented x 3 Psych: normal  mood and affect  Data Reviewed:  Reviewed labs, radiology imaging, old records and pertinent past GI work up   Assessment and Plan/Recommendations:  67 year old female with history of gastric bypass, status post cholecystectomy, breath test positive for H. pylori status post treatment here with complaints of heartburn, dysphagia and epigastric discomfort  H pylori: Patient has not been tested to confirm eradication of H. Pylori  Dysphagia: We will schedule for EGD to evaluate for dysphagia and possible esophageal dilation if needed  Epigastric astric abdominal pain: Status post gastric bypass surgery During EGD will evaluate for possible anastomotic ulcer Also obtain gastric biopsies to confirm H. pylori eradication  GERD: Start omeprazole 20 mg daily for heartburn likely secondary to gastroesophageal reflux disease Discussed lifestyle modification and antireflux measures   Colorectal cancer screening Due for screening colonoscopy as last colonoscopy was about 10 years ago per patient Obtain records from [facility]  The risks and benefits as well as alternatives of endoscopic procedure(s) have been discussed and reviewed. All questions answered. The patient agrees to proceed.    Damaris Hippo , MD [phone]    CC: Vicie Mutters, PA-C

## 2018-03-05 ENCOUNTER — Encounter: Payer: Self-pay | Admitting: Gastroenterology

## 2018-03-05 ENCOUNTER — Encounter: Payer: Self-pay | Admitting: Internal Medicine

## 2018-03-05 ENCOUNTER — Ambulatory Visit: Payer: Federal, State, Local not specified - PPO | Admitting: Internal Medicine

## 2018-03-05 ENCOUNTER — Other Ambulatory Visit: Payer: Self-pay | Admitting: *Deleted

## 2018-03-05 VITALS — BP 136/86 | HR 60 | Temp 97.5°F | Resp 16 | Ht 64.0 in | Wt 177.0 lb

## 2018-03-05 DIAGNOSIS — Z79899 Other long term (current) drug therapy: Secondary | ICD-10-CM

## 2018-03-05 DIAGNOSIS — R7309 Other abnormal glucose: Secondary | ICD-10-CM | POA: Diagnosis not present

## 2018-03-05 DIAGNOSIS — E782 Mixed hyperlipidemia: Secondary | ICD-10-CM | POA: Diagnosis not present

## 2018-03-05 DIAGNOSIS — E039 Hypothyroidism, unspecified: Secondary | ICD-10-CM

## 2018-03-05 DIAGNOSIS — I10 Essential (primary) hypertension: Secondary | ICD-10-CM

## 2018-03-05 DIAGNOSIS — Z8639 Personal history of other endocrine, nutritional and metabolic disease: Secondary | ICD-10-CM

## 2018-03-05 DIAGNOSIS — E559 Vitamin D deficiency, unspecified: Secondary | ICD-10-CM

## 2018-03-05 MED ORDER — GABAPENTIN 100 MG PO CAPS: ORAL_CAPSULE | ORAL | 1 refills | Status: DC

## 2018-03-05 NOTE — Patient Instructions (Signed)

## 2018-03-05 NOTE — Progress Notes (Signed)
This very nice 67 y.o. MWF presents for 6 month follow up with HTN, HLD, Pre-Diabetes and Vitamin D Deficiency. Patients GERD sx's ar e con5trolled with her meds    Patient is scheduled for Colon & EGD w/ Dr Silverio Decamp on May 30th.      Patient is treated for HTN (1995) & BP has been controlled at home. Today's BP was initially sl elevated and rechecked at goal - 136/86.  Patient has had 2 Normal Heart Caths (1995 and 2000).  Patient has had no complaints of any cardiac type chest pain, palpitations, dyspnea / orthopnea / PND, dizziness, claudication, or dependent edema.     Hyperlipidemia is controlled with diet & meds. Patient denies myalgias or other med SE's. 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 She denies symptoms of reactive hypoglycemia, diabetic polys, paresthesias or visual blurring.  Last A1c was Normal: Lab Results  Component Value Date   HGBA1C 5.4 07/31/2017      In 2008, patient was dx'd Hypothyroid and has since been on replacement. Further, the patient also has history of Vitamin D Deficiency ("28"/2008) and supplements vitamin D without any suspected side-effects. Last vitamin D was at goal: Lab Results  Component Value Date   VD25OH 60 07/31/2017   Current Outpatient Medications on File Prior to Visit  Medication Sig  . acyclovir (ZOVIRAX) 400 MG tablet Take 1 tablet (400 mg total) by mouth daily.  . Ascorbic Acid (VITAMIN C) 500 MG CAPS Take by mouth daily.  Marland Kitchen aspirin 81 MG tablet Take 81 mg by mouth daily.  . Biotin 10 MG TABS Take 10,000 each by mouth daily.  . Calcium  Citrate-Vitamin D (CALCIUM CITRATE + D3 PO) Take 600 mg by mouth daily.   . cyclobenzaprine (FLEXERIL) 10 MG tablet Take 1 tablet (10 mg total) by mouth 3 (three) times daily as needed for muscle spasms.  . diphenhydramine-acetaminophen (TYLENOL PM) 25-500 MG TABS tablet Take 1 tablet by mouth at bedtime as needed.  . Ferrous Sulfate (SLOW RELEASE IRON PO) Take by mouth daily.  . fish oil-omega-3 fatty acids 1000 MG capsule Take 1 g by mouth daily.  . fluticasone (FLONASE) 50 MCG/ACT nasal spray Place 2 sprays into both nostrils daily.  Marland Kitchen levothyroxine (SYNTHROID, LEVOTHROID) 75 MCG tablet TAKE ONE TABLET BY MOUTH ONCE DAILY  . Magnesium 250 MG TABS Take by mouth daily.  . Melatonin 10 MG TABS Take 1 tablet by mouth at bedtime.  . meloxicam (MOBIC) 15 MG tablet Take 1 tablet (15 mg total) by mouth daily.  Marland Kitchen omeprazole (PRILOSEC) 20 MG capsule Take 1 capsule (20 mg total) by mouth daily.  Marland Kitchen OVER THE COUNTER MEDICATION Takes Allegra 25 mg at bedtime.  . potassium chloride (K-DUR) 10 MEQ tablet TAKE ONE TABLET BY MOUTH TWICE DAILY  . pravastatin (PRAVACHOL) 40 MG tablet TAKE ONE TABLET BY MOUTH ONCE DAILY  . ranitidine (ZANTAC) 300 MG tablet TAKE ONE TO TWO TABLETS BY MOUTH DAILY FOR  HEARTBURN & REFLUX  . rOPINIRole (REQUIP)  0.5 MG tablet TAKE ONE-HALF (1/2) TABLET AT NIGHT. CAN INCREASE TO TAKE ONE TABLET, THEN MAY INCREASE UP TO 4 TABLETS AT NIGHT AS DIRECTED.  Marland Kitchen sucralfate (CARAFATE) 1 g tablet Take 1 tablet (1 g total) by mouth 3 (three) times daily as needed.  Manus Gunning BOWEL PREP KIT 17.5-3.13-1.6 GM/177ML SOLN Take 1 kit by mouth as directed.  . vitamin B-12 (CYANOCOBALAMIN) 1000 MCG tablet Take 1,000 mcg by mouth daily.   . Vitamin D, Ergocalciferol, (DRISDOL) 50000 units CAPS capsule TAKE ONE CAPSULE BY MOUTH ONCE DAILY   No current facility-administered medications on file prior to visit.    Allergies  Allergen Reactions  . Codeine Other (See Comments)    "puts me out of it".   Dysphoria.  Patient tolerates Tramadol without complications.  . Nabumetone     GI upset   PMHx:   Past Medical History:  Diagnosis Date  . Allergic rhinitis   . Arthritis   . Cholecystitis, chronic   . Dysrhythmia    Hx bradycardia - hx normal cardiac cath x2 / ECHO 2009 EF 60% LAE MYOVIEW 3/12 EF 60% NO ISCHEMIA  . Hyperlipidemia   . Hypertension    no treatment after 120 lb weight loss  . Hypothyroidism   . Prediabetes   . Thyroid nodule    Immunization History  Administered Date(s) Administered  . Influenza Split 08/25/2013, 09/02/2014, 08/10/2015  . Influenza, High Dose Seasonal PF 07/31/2017  . Influenza,inj,quad, With Preservative 08/10/2016  . PPD Test 02/23/2014, 05/23/2016  . Pneumococcal Polysaccharide-23 10/18/2008  . Td 08/08/2006  . Tdap 05/23/2016  . Zoster 10/15/2012   Past Surgical History:  Procedure Laterality Date  . Bilateral carpal tunnel surgery    . BREATH TEK H PYLORI  07/01/2012   Procedure: BREATH TEK H PYLORI;  Surgeon: Edward Jolly, MD;  Location: [facility];  Service: General;  Laterality: N/A;  . CHOLECYSTECTOMY N/A 11/27/2013   Procedure: LAPAROSCOPIC CHOLECYSTECTOMY WITH INTRAOPERATIVE CHOLANGIOGRAM;  Surgeon: Pedro Earls, MD;  Location: [facility];  Service: General;  Laterality: N/A;  . GASTRIC ROUX-EN-Y N/A 12/16/2012   Procedure: LAPAROSCOPIC ROUX-EN-Y GASTRIC BYPASS WITH UPPER ENDOSCOPY;  Surgeon: Pedro Earls, MD;  Location: [facility];  Service: General;  Laterality: N/A;  Gastric Bypass  . NASAL SINUS SURGERY    . REPLACEMENT TOTAL KNEE BILATERAL     bilat  . TUBAL LIGATION     FHx:    Reviewed / unchanged  SHx:    Reviewed / unchanged   Systems Review:  Constitutional: Denies fever, chills, wt changes, headaches, insomnia, fatigue, night sweats, change in appetite. Eyes: Denies redness, blurred vision, diplopia, discharge, itchy, watery eyes.  ENT: Denies discharge, congestion, post nasal drip, epistaxis, sore throat,  earache, hearing loss, dental pain, tinnitus, vertigo, sinus pain, snoring.  CV: Denies chest pain, palpitations, irregular heartbeat, syncope, dyspnea, diaphoresis, orthopnea, PND, claudication or edema. Respiratory: denies cough, dyspnea, DOE, pleurisy, hoarseness, laryngitis, wheezing.  Gastrointestinal: Denies dysphagia, odynophagia, heartburn, reflux, water brash, abdominal pain or cramps, nausea, vomiting, bloating, diarrhea, constipation, hematemesis, melena, hematochezia  or hemorrhoids. Genitourinary: Denies dysuria, frequency, urgency, nocturia, hesitancy, discharge, hematuria or flank pain. Musculoskeletal: Denies arthralgias, myalgias, stiffness, jt. swelling, pain, limping or strain/sprain.  Skin: Denies pruritus, rash, hives, warts, acne, eczema or change in skin lesion(s). Neuro: No weakness, tremor, incoordination, spasms, paresthesia or pain. Psychiatric: Denies confusion, memory loss or sensory loss. Endo: Denies change in weight, skin or hair change.  Heme/Lymph: No excessive bleeding, bruising  or enlarged lymph nodes.  Physical Exam  BP 136/86   Pulse 60   Temp (!) 97.5 F (36.4 C)   Resp 16   Ht 5' 4" (1.626 m)   Wt 177 lb (80.3 kg)   LMP  (LMP Unknown)   BMI 30.38 kg/m   Appears  well nourished, well groomed  and in no distress.  Eyes: PERRLA, EOMs, conjunctiva no swelling or erythema. Sinuses: No frontal/maxillary tenderness ENT/Mouth: EAC's clear, TM's nl w/o erythema, bulging. Nares clear w/o erythema, swelling, exudates. Oropharynx clear without erythema or exudates. Oral hygiene is good. Tongue normal, non obstructing. Hearing intact.  Neck: Supple. Thyroid not palpable. Car 2+/2+ without bruits, nodes or JVD. Chest: Respirations nl with BS clear & equal w/o rales, rhonchi, wheezing or stridor.  Cor: Heart sounds normal w/ regular rate and rhythm without sig. murmurs, gallops, clicks or rubs. Peripheral pulses normal and equal  without edema.  Abdomen: Soft  & bowel sounds normal. Non-tender w/o guarding, rebound, hernias, masses or organomegaly.  Lymphatics: Unremarkable.  Musculoskeletal: Full ROM all peripheral extremities, joint stability, 5/5 strength and normal gait.  Skin: Warm, dry without exposed rashes, lesions or ecchymosis apparent.  Neuro: Cranial nerves intact, reflexes equal bilaterally. Sensory-motor testing grossly intact. Tendon reflexes grossly intact.  Pysch: Alert & oriented x 3.  Insight and judgement nl & appropriate. No ideations.  Assessment and Plan:  1. Essential hypertension  - Continue medication, monitor blood pressure at home.  - Continue DASH diet.  Reminder to go to the ER if any CP,  SOB, nausea, dizziness, severe HA, changes vision/speech.  - CBC with Differential/Platelet - COMPLETE METABOLIC PANEL WITH GFR - Magnesium - TSH  2. Hyperlipidemia, mixed  - Continue diet/meds, exercise,& lifestyle modifications.  - Continue monitor periodic cholesterol/liver & renal functions   - Lipid panel - TSH  3. History of diet-controlled diabetes  - Continue diet, exercise, lifestyle modifications.  - Monitor appropriate labs.  - Hemoglobin A1c - Insulin, random  4. Vitamin D deficiency  - Continue supplementation.   - VITAMIN D 25 Hydroxyl  5. Hypothyroidism  - TSH  6. Medication management  - CBC with Differential/Platelet - COMPLETE METABOLIC PANEL WITH GFR - Magnesium - Lipid panel - TSH - Hemoglobin A1c - Insulin, random - VITAMIN D 25 Hydroxyl          Discussed  regular exercise, BP monitoring, weight control to achieve/maintain BMI less than 25 and discussed med and SE's. Recommended labs to assess and monitor clinical status with further disposition pending results of labs. Over 30 minutes of exam, counseling, chart review was performed.

## 2018-03-06 LAB — COMPLETE METABOLIC PANEL WITH GFR
AG Ratio: 1.6 (calc) (ref 1.0–2.5)
ALKALINE PHOSPHATASE (APISO): 103 U/L (ref 33–130)
ALT: 16 U/L (ref 6–29)
AST: 18 U/L (ref 10–35)
Albumin: 4.2 g/dL (ref 3.6–5.1)
BUN: 12 mg/dL (ref 7–25)
CO2: 25 mmol/L (ref 20–32)
Calcium: 9.5 mg/dL (ref 8.6–10.4)
Chloride: 106 mmol/L (ref 98–110)
Creat: 0.89 mg/dL (ref 0.50–0.99)
GFR, Est African American: 79 mL/min/{1.73_m2} (ref >=60)
GFR, Est Non African American: 68 mL/min/{1.73_m2} (ref >=60)
GLOBULIN: 2.6 g/dL (ref 1.9–3.7)
Glucose, Bld: 105 mg/dL — ABNORMAL HIGH (ref 65–99)
POTASSIUM: 4 mmol/L (ref 3.5–5.3)
SODIUM: 140 mmol/L (ref 135–146)
Total Bilirubin: 1.1 mg/dL (ref 0.2–1.2)
Total Protein: 6.8 g/dL (ref 6.1–8.1)

## 2018-03-06 LAB — CBC WITH DIFFERENTIAL/PLATELET
Basophils Absolute: 9 cells/uL (ref 0–200)
Basophils Relative: 0.1 %
EOS PCT: 0 %
Eosinophils Absolute: 0 cells/uL — ABNORMAL LOW (ref 15–500)
HEMATOCRIT: 35.9 % (ref 35.0–45.0)
Hemoglobin: 12.1 g/dL (ref 11.7–15.5)
Lymphs Abs: 1382 cells/uL (ref 850–3900)
MCH: 30.5 pg (ref 27.0–33.0)
MCHC: 33.7 g/dL (ref 32.0–36.0)
MCV: 90.4 fL (ref 80.0–100.0)
MONOS PCT: 5.9 %
MPV: 11.1 fL (ref 7.5–12.5)
NEUTROS ABS: 6890 {cells}/uL (ref 1500–7800)
NEUTROS PCT: 78.3 %
PLATELETS: 285 10*3/uL (ref 140–400)
RBC: 3.97 10*6/uL (ref 3.80–5.10)
RDW: 12.1 % (ref 11.0–15.0)
TOTAL LYMPHOCYTE: 15.7 %
WBC: 8.8 10*3/uL (ref 3.8–10.8)
WBCMIX: 519 {cells}/uL (ref 200–950)

## 2018-03-06 LAB — LIPID PANEL
CHOLESTEROL: 175 mg/dL (ref <200)
HDL: 76 mg/dL (ref >50)
LDL Cholesterol (Calc): 86 mg/dL (calc)
Non-HDL Cholesterol (Calc): 99 mg/dL (calc) (ref <130)
Total CHOL/HDL Ratio: 2.3 (calc) (ref <5.0)
Triglycerides: 47 mg/dL (ref <150)

## 2018-03-06 LAB — VITAMIN D 25 HYDROXY (VIT D DEFICIENCY, FRACTURES): Vit D, 25-Hydroxy: 51 ng/mL (ref 30–100)

## 2018-03-06 LAB — MAGNESIUM: MAGNESIUM: 1.9 mg/dL (ref 1.5–2.5)

## 2018-03-06 LAB — HEMOGLOBIN A1C
HEMOGLOBIN A1C: 5.5 %{Hb} (ref <5.7)
MEAN PLASMA GLUCOSE: 111 (calc)
eAG (mmol/L): 6.2 (calc)

## 2018-03-06 LAB — TSH: TSH: 0.58 mIU/L (ref 0.40–4.50)

## 2018-03-06 LAB — INSULIN, RANDOM: INSULIN: 13 u[IU]/mL (ref 2.0–19.6)

## 2018-03-27 ENCOUNTER — Other Ambulatory Visit: Payer: Self-pay

## 2018-03-27 ENCOUNTER — Ambulatory Visit (INDEPENDENT_AMBULATORY_CARE_PROVIDER_SITE_OTHER): Payer: Federal, State, Local not specified - PPO | Admitting: Gastroenterology

## 2018-03-27 ENCOUNTER — Encounter: Payer: Self-pay | Admitting: Gastroenterology

## 2018-03-27 VITALS — BP 117/78 | HR 50 | Temp 99.1°F | Resp 15 | Ht 64.0 in | Wt 177.0 lb

## 2018-03-27 DIAGNOSIS — R1310 Dysphagia, unspecified: Secondary | ICD-10-CM

## 2018-03-27 DIAGNOSIS — Z86010 Personal history of colonic polyps: Secondary | ICD-10-CM

## 2018-03-27 DIAGNOSIS — D125 Benign neoplasm of sigmoid colon: Secondary | ICD-10-CM | POA: Diagnosis not present

## 2018-03-27 DIAGNOSIS — K219 Gastro-esophageal reflux disease without esophagitis: Secondary | ICD-10-CM | POA: Diagnosis not present

## 2018-03-27 DIAGNOSIS — R1013 Epigastric pain: Secondary | ICD-10-CM

## 2018-03-27 DIAGNOSIS — Z1211 Encounter for screening for malignant neoplasm of colon: Secondary | ICD-10-CM

## 2018-03-27 MED ORDER — SODIUM CHLORIDE 0.9 % IV SOLN: 500.0000 mL | Freq: Once | INTRAVENOUS | Status: DC

## 2018-03-27 NOTE — Progress Notes (Signed)
Pt's states no medical or surgical changes since previsit or office visit. 

## 2018-03-27 NOTE — Patient Instructions (Signed)
Thank you for allowing [place] to care for you today!  Await pathology results (for H. Pylori and polyp)   by mail, approximately 2 weeks.  Repeat colonoscopy with more extensive bowel prep.  Appointment to be determined.  Resume current diet and medications.        YOU HAD AN ENDOSCOPIC PROCEDURE TODAY AT [facility]:   Refer to the procedure report that was given to you for any specific questions about what was found during the examination.  If the procedure report does not answer your questions, please call your gastroenterologist to clarify.  If you requested that your care partner not be given the details of your procedure findings, then the procedure report has been included in a sealed envelope for you to review at your convenience later.  YOU SHOULD EXPECT: Some feelings of bloating in the abdomen. Passage of more gas than usual.  Walking can help get rid of the air that was put into your GI tract during the procedure and reduce the bloating. If you had a lower endoscopy (such as a colonoscopy or flexible sigmoidoscopy) you may notice spotting of blood in your stool or on the toilet paper. If you underwent a bowel prep for your procedure, you may not have a normal bowel movement for a few days.  Please Note:  You might notice some irritation and congestion in your nose or some drainage.  This is from the oxygen used during your procedure.  There is no need for concern and it should clear up in a day or so.  SYMPTOMS TO REPORT IMMEDIATELY:   Following lower endoscopy (colonoscopy or flexible sigmoidoscopy):  Excessive amounts of blood in the stool  Significant tenderness or worsening of abdominal pains  Swelling of the abdomen that is new, acute  Fever of 100F or higher   Following upper endoscopy (EGD)  Vomiting of blood or coffee ground material  New chest pain or pain under the shoulder blades  Painful or persistently difficult swallowing  New shortness of  breath  Fever of 100F or higher  Black, tarry-looking stools  For urgent or emergent issues, a gastroenterologist can be reached at any hour by calling [phone].   DIET:  We do recommend a small meal at first, but then you may proceed to your regular diet.  Drink plenty of fluids but you should avoid alcoholic beverages for 24 hours.  ACTIVITY:  You should plan to take it easy for the rest of today and you should NOT DRIVE or use heavy machinery until tomorrow (because of the sedation medicines used during the test).    FOLLOW UP: Our staff will call the number listed on your records the next business day following your procedure to check on you and address any questions or concerns that you may have regarding the information given to you following your procedure. If we do not reach you, we will leave a message.  However, if you are feeling well and you are not experiencing any problems, there is no need to return our call.  We will assume that you have returned to your regular daily activities without incident.  If any biopsies were taken you will be contacted by phone or by letter within the next 1-3 weeks.  Please call us at [phone] if you have not heard about the biopsies in 3 weeks.    SIGNATURES/CONFIDENTIALITY: You and/or your care partner have signed paperwork which will be entered into your electronic medical  record.  These signatures attest to the fact that that the information above on your After Visit Summary has been reviewed and is understood.  Full responsibility of the confidentiality of this discharge information lies with you and/or your care-partner. 

## 2018-03-27 NOTE — Progress Notes (Signed)
To PACU VSS. Report to [email or web address] 

## 2018-03-27 NOTE — Op Note (Signed)
[facility] Patient Name: Tracy Paul Procedure Date: 03/27/2018 2:40 PM MRN: [id number] Endoscopist: Mauri Pole , MD Age: 67 Referring MD:  Date of Birth: 10/29/1951 Gender: Female Account #: [id number] Procedure:                Colonoscopy Indications:              High risk colon cancer surveillance: Personal                            history of colonic polyps Medicines:                Monitored Anesthesia Care Procedure:                Pre-Anesthesia Assessment:                           - Prior to the procedure, a History and Physical                            was performed, and patient medications and                            allergies were reviewed. The patient's tolerance of                            previous anesthesia was also reviewed. The risks                            and benefits of the procedure and the sedation                            options and risks were discussed with the patient.                            All questions were answered, and informed consent                            was obtained. Prior Anticoagulants: The patient has                            taken no previous anticoagulant or antiplatelet                            agents. ASA Grade Assessment: II - A patient with                            mild systemic disease. After reviewing the risks                            and benefits, the patient was deemed in                            satisfactory condition to undergo the procedure.  After obtaining informed consent, the colonoscope                            was passed under direct vision. Throughout the                            procedure, the patient's blood pressure, pulse, and                            oxygen saturations were monitored continuously. The                            Model PCF-[id number]) scope was introduced                            through the anus and advanced to  the the hepatic                            flexure for evaluation. This was the intended                            extent. The colonoscopy was technically difficult                            and complex due to poor bowel prep. The patient                            tolerated the procedure well. The quality of the                            bowel preparation was poor. The rectum was                            photographed. Scope In: 2:50:29 PM Scope Out: 3:08:35 PM Total Procedure Duration: 0 hours 18 minutes 6 seconds  Findings:                 The perianal and digital rectal examinations were                            normal.                           A 11 mm polyp was found in the sigmoid colon. The                            polyp was sessile. The polyp was removed with a                            cold snare. Resection and retrieval were complete.                           Multiple small and large-mouthed diverticula were  found in the sigmoid colon, descending colon and                            transverse colon.                           Non-bleeding internal hemorrhoids were found during                            retroflexion. The hemorrhoids were medium-sized. Complications:            No immediate complications. Estimated Blood Loss:     Estimated blood loss was minimal. Impression:               - Preparation of the colon was poor.                           - One 11 mm polyp in the sigmoid colon, removed                            with a cold snare. Resected and retrieved.                           - Moderate diverticulosis in the sigmoid colon, in                            the descending colon and in the transverse colon.                           - Non-bleeding internal hemorrhoids. Recommendation:           - Patient has a contact number available for                            emergencies. The signs and symptoms of potential                             delayed complications were discussed with the                            patient. Return to normal activities tomorrow.                            Written discharge instructions were provided to the                            patient.                           - Resume previous diet.                           - Continue present medications.                           - Await pathology results.                           -  Repeat colonoscopy at the next available                            appointment because the bowel preparation was poor.                           - For future colonoscopy the patient will require                            an extended preparation. If there are any                            questions, please contact the gastroenterologist. Mauri Pole, MD 03/27/2018 3:23:43 PM This report has been signed electronically.

## 2018-03-27 NOTE — Op Note (Signed)
[facility] Patient Name: Tracy Paul Procedure Date: 03/27/2018 2:40 PM MRN: [id number] Endoscopist: Mauri Pole , MD Age: 67 Referring MD:  Date of Birth: 1950/11/24 Gender: Female Account #: [id number] Procedure:                Upper GI endoscopy Indications:              Dysphagia, Epigastric abdominal pain Medicines:                Monitored Anesthesia Care Procedure:                Pre-Anesthesia Assessment:                           - Prior to the procedure, a History and Physical                            was performed, and patient medications and                            allergies were reviewed. The patient's tolerance of                            previous anesthesia was also reviewed. The risks                            and benefits of the procedure and the sedation                            options and risks were discussed with the patient.                            All questions were answered, and informed consent                            was obtained. Prior Anticoagulants: The patient has                            taken no previous anticoagulant or antiplatelet                            agents. ASA Grade Assessment: II - A patient with                            mild systemic disease. After reviewing the risks                            and benefits, the patient was deemed in                            satisfactory condition to undergo the procedure.                           After obtaining informed consent, the endoscope was  passed under direct vision. Throughout the                            procedure, the patient's blood pressure, pulse, and                            oxygen saturations were monitored continuously. The                            Endoscope was introduced through the mouth, and                            advanced to the second part of duodenum. The upper                            GI endoscopy  was accomplished without difficulty.                            The patient tolerated the procedure well. Scope In: Scope Out: Findings:                 Esophagogastric landmarks were identified: the                            Z-line was found at 35 cm and the gastroesophageal                            junction was found at 35 cm from the incisors.                           No endoscopic abnormality was evident in the                            esophagus to explain the patient's complaint of                            dysphagia.                           Evidence of a gastric bypass was found. A gastric                            pouch with a normal size was found. The staple line                            appeared intact. The gastrojejunal anastomosis was                            characterized by healthy appearing mucosa. This was                            traversed. The pouch-to-jejunum limb was  characterized by healthy appearing mucosa. The                            jejunojejunal anastomosis was characterized by                            healthy appearing mucosa. Biopsies were taken with                            a cold forceps for Helicobacter pylori testing                            using CLOtest. Complications:            No immediate complications. Estimated Blood Loss:     Estimated blood loss was minimal. Impression:               - Esophagogastric landmarks identified.                           - No endoscopic esophageal abnormality to explain                            patient's dysphagia.                           - Gastric bypass with a normal-sized pouch and                            intact staple line. Gastrojejunal anastomosis                            characterized by healthy appearing mucosa. Biopsied. Recommendation:           - Patient has a contact number available for                            emergencies. The signs and  symptoms of potential                            delayed complications were discussed with the                            patient. Return to normal activities tomorrow.                            Written discharge instructions were provided to the                            patient.                           - Resume previous diet.                           - Continue present medications.                           -  Await pathology results. Mauri Pole, MD 03/27/2018 3:18:47 PM This report has been signed electronically.

## 2018-03-27 NOTE — Progress Notes (Signed)
Called to room to assist during endoscopic procedure.  Patient ID and intended procedure confirmed with present staff. Received instructions for my participation in the procedure from the performing physician.  

## 2018-03-28 ENCOUNTER — Telehealth: Payer: Self-pay

## 2018-03-28 ENCOUNTER — Other Ambulatory Visit: Payer: Self-pay | Admitting: Physician Assistant

## 2018-03-28 LAB — HELICOBACTER PYLORI SCREEN-BIOPSY: UREASE: NEGATIVE

## 2018-03-28 NOTE — Telephone Encounter (Signed)
  Follow up Call-  Call back number 03/27/2018  Post procedure Call Back phone  # [phone]  Permission to leave phone message Yes  Some recent data might be hidden     Patient questions:  Do you have a fever, pain , or abdominal swelling? No. Pain Score  0 *  Have you tolerated food without any problems? Yes.    Have you been able to return to your normal activities? Yes.    Do you have any questions about your discharge instructions: Diet   No. Medications  No. Follow up visit  No.  Do you have questions or concerns about your Care? No.  Actions: * If pain score is 4 or above: No action needed, pain <4.

## 2018-03-31 ENCOUNTER — Ambulatory Visit: Payer: Federal, State, Local not specified - PPO | Admitting: Internal Medicine

## 2018-03-31 ENCOUNTER — Encounter: Payer: Self-pay | Admitting: Internal Medicine

## 2018-03-31 VITALS — BP 142/84 | HR 56 | Temp 97.7°F | Resp 16 | Ht 64.0 in | Wt 183.0 lb

## 2018-03-31 DIAGNOSIS — J0140 Acute pansinusitis, unspecified: Secondary | ICD-10-CM | POA: Diagnosis not present

## 2018-03-31 DIAGNOSIS — J40 Bronchitis, not specified as acute or chronic: Secondary | ICD-10-CM

## 2018-03-31 MED ORDER — LEVOFLOXACIN 500 MG PO TABS: ORAL_TABLET | ORAL | 1 refills | Status: DC

## 2018-03-31 MED ORDER — PREDNISONE 20 MG PO TABS: ORAL_TABLET | ORAL | 0 refills | Status: DC

## 2018-03-31 NOTE — Patient Instructions (Signed)

## 2018-03-31 NOTE — Progress Notes (Signed)
Subjective:    Patient ID: Tracy Paul, female    DOB: 1951/10/06, 67 y.o.   MRN: [id number]  HPI  This very nice 67 yo MWF presents with head & chest congestion and cough productive of a yellow-green sputum. Sinus HA & pressure has persisted x 3-4 weeks w/o resolution. Denies fevers, chills, sweats, rash or dyspnea.   Outpatient Medications Prior to Visit  Medication Sig Dispense Refill  . acyclovir (ZOVIRAX) 400 MG tablet Take 1 tablet (400 mg total) by mouth daily. 90 tablet 1  . Ascorbic Acid (VITAMIN C) 500 MG CAPS Take by mouth daily.    Marland Kitchen aspirin 81 MG tablet Take 81 mg by mouth daily.    . Biotin 10 MG TABS Take 10,000 each by mouth daily.    . Calcium Citrate-Vitamin D (CALCIUM CITRATE + D3 PO) Take 600 mg by mouth daily.     . cyclobenzaprine (FLEXERIL) 10 MG tablet Take 1 tablet (10 mg total) by mouth 3 (three) times daily as needed for muscle spasms. 90 tablet 1  . diphenhydramine-acetaminophen (TYLENOL PM) 25-500 MG TABS tablet Take 1 tablet by mouth at bedtime as needed.    . Ferrous Sulfate (SLOW RELEASE IRON PO) Take by mouth daily.    . fish oil-omega-3 fatty acids 1000 MG capsule Take 1 g by mouth daily.    . fluticasone (FLONASE) 50 MCG/ACT nasal spray Place 2 sprays into both nostrils daily. 16 g 0  . gabapentin (NEURONTIN) 100 MG capsule Take 1 capsule 4 x / day for Neuropathy Ppain 360 capsule 1  . levothyroxine (SYNTHROID, LEVOTHROID) 75 MCG tablet TAKE ONE TABLET BY MOUTH ONCE DAILY 90 tablet 1  . Magnesium 250 MG TABS Take by mouth daily.    . Melatonin 10 MG TABS Take 1 tablet by mouth at bedtime.    . meloxicam (MOBIC) 15 MG tablet Take 1 tablet (15 mg total) by mouth daily. 90 tablet 0  . omeprazole (PRILOSEC) 20 MG capsule Take 1 capsule (20 mg total) by mouth daily. 30 capsule 3  . OVER THE COUNTER MEDICATION Takes Allegra 25 mg at bedtime.    . potassium chloride (K-DUR) 10 MEQ tablet TAKE 1 TABLET BY MOUTH TWICE DAILY 120 tablet 1  . pravastatin  (PRAVACHOL) 40 MG tablet TAKE ONE TABLET BY MOUTH ONCE DAILY 90 tablet 2  . ranitidine (ZANTAC) 300 MG tablet TAKE ONE TO TWO TABLETS BY MOUTH DAILY FOR  HEARTBURN & REFLUX 180 tablet 1  . rOPINIRole (REQUIP) 0.5 MG tablet TAKE ONE-HALF (1/2) TABLET AT NIGHT. CAN INCREASE TO TAKE ONE TABLET, THEN MAY INCREASE UP TO 4 TABLETS AT NIGHT AS DIRECTED. 360 tablet 1  . sucralfate (CARAFATE) 1 g tablet Take 1 tablet (1 g total) by mouth 3 (three) times daily as needed. 60 tablet 1  . vitamin B-12 (CYANOCOBALAMIN) 1000 MCG tablet Take 1,000 mcg by mouth daily.     . Vitamin D, Ergocalciferol, (DRISDOL) 50000 units CAPS capsule TAKE ONE CAPSULE BY MOUTH ONCE DAILY 90 capsule 1   Facility-Administered Medications Prior to Visit  Medication Dose Route Frequency Provider Last Rate Last Dose  . 0.9 %  sodium chloride infusion  500 mL Intravenous Once Nandigam, Venia Minks, MD       Allergies  Allergen Reactions  . Codeine Other (See Comments)    "puts me out of it".  Dysphoria.  Patient tolerates Tramadol without complications.  . Nabumetone     GI upset   Past Medical History:  Diagnosis Date  . Allergic rhinitis   . Arthritis   . Cholecystitis, chronic   . Dysrhythmia    Hx bradycardia - hx normal cardiac cath x2 / ECHO 2009 EF 60% LAE MYOVIEW 3/12 EF 60% NO ISCHEMIA  . Hyperlipidemia   . Hypertension    no treatment after 120 lb weight loss  . Hypothyroidism   . Prediabetes   . Thyroid nodule    10 point systems review negative except as above.    Objective:   Physical Exam  BP (!) 142/84   Pulse (!) 56   Temp 97.7 F (36.5 C)   Resp 16   Ht 5\' 4"  (1.626 m)   Wt 183 lb (83 kg)   LMP  (LMP Unknown)   BMI 31.41 kg/m   Congested cough. No stridor.   HEENT - Eac's patent. TM's Nl. (+) fronto-maxillary tenderness. EOM's full. PERRLA. NasoOroPharynx clear. Neck - supple. Nl Thyroid. Carotids 2+ & No bruits, nodes, JVD Chest - Scattered  Rales and  Rhonchi, no wheezes. Cor - Nl HS.  RRR w/o sig MGR. PP 1(+). No edema. MS- FROM w/o deformities. . Gait Nl. Neuro - No obvious Cr N abnormalities. Nl w/o focal abnormalities. Skin - exposed clear w/o rash cyanosis or icterus.    Assessment & Plan:   1. Subacute pansinusitis  - predniSONE (DELTASONE) 20 MG tablet; 1 tab 3 x day for 3 days, then 1 tab 2 x day for 3 days, then 1 tab 1 x day for 5 days  Dispense: 20 tablet  - levofloxacin (LEVAQUIN) 500 MG tablet; Take 1 tablet daily with food for infection  Dispense: 15 tablet; Refill: 1  2. Bronchitis  - predniSONE (DELTASONE) 20 MG tablet; 1 tab 3 x day for 3 days, then 1 tab 2 x day for 3 days, then 1 tab 1 x day for 5 days  Dispense: 20 tablet  - levofloxacin (LEVAQUIN) 500 MG tablet; Take 1 tablet daily with food for infection  Dispense: 15 tablet; Refill: 1

## 2018-04-02 ENCOUNTER — Encounter: Payer: Self-pay | Admitting: Gastroenterology

## 2018-04-10 ENCOUNTER — Other Ambulatory Visit: Payer: Self-pay | Admitting: Internal Medicine

## 2018-04-10 MED ORDER — AZITHROMYCIN 250 MG PO TABS: ORAL_TABLET | ORAL | 1 refills | Status: DC

## 2018-04-10 MED ORDER — PREDNISONE 20 MG PO TABS: ORAL_TABLET | ORAL | 0 refills | Status: DC

## 2018-05-12 ENCOUNTER — Ambulatory Visit (AMBULATORY_SURGERY_CENTER): Payer: Self-pay | Admitting: *Deleted

## 2018-05-12 ENCOUNTER — Other Ambulatory Visit: Payer: Federal, State, Local not specified - PPO

## 2018-05-12 VITALS — Ht 62.25 in | Wt 181.0 lb

## 2018-05-12 DIAGNOSIS — Z86010 Personal history of colonic polyps: Secondary | ICD-10-CM

## 2018-05-12 MED ORDER — NA SULFATE-K SULFATE-MG SULF 17.5-3.13-1.6 GM/177ML PO SOLN: 1.0000 | Freq: Once | ORAL | 0 refills | Status: AC

## 2018-05-15 ENCOUNTER — Encounter: Payer: Self-pay | Admitting: Gastroenterology

## 2018-05-18 ENCOUNTER — Other Ambulatory Visit: Payer: Self-pay | Admitting: Physician Assistant

## 2018-05-28 ENCOUNTER — Encounter: Payer: Self-pay | Admitting: Gastroenterology

## 2018-05-28 ENCOUNTER — Ambulatory Visit (AMBULATORY_SURGERY_CENTER): Payer: Federal, State, Local not specified - PPO | Admitting: Gastroenterology

## 2018-05-28 VITALS — BP 135/70 | HR 51 | Temp 98.4°F | Resp 13 | Ht 64.0 in | Wt 183.0 lb

## 2018-05-28 DIAGNOSIS — D123 Benign neoplasm of transverse colon: Secondary | ICD-10-CM | POA: Diagnosis not present

## 2018-05-28 DIAGNOSIS — Z86010 Personal history of colonic polyps: Secondary | ICD-10-CM

## 2018-05-28 DIAGNOSIS — D120 Benign neoplasm of cecum: Secondary | ICD-10-CM | POA: Diagnosis not present

## 2018-05-28 MED ORDER — SODIUM CHLORIDE 0.9 % IV SOLN: 500.0000 mL | Freq: Once | INTRAVENOUS | Status: DC

## 2018-05-28 NOTE — Progress Notes (Signed)
Pt's states no medical or surgical changes since previsit or office visit. 

## 2018-05-28 NOTE — Patient Instructions (Signed)
HANDOUTS GIVEN FOR POLYPS, DIVERTICULOSIS AND HEMORRHOIDS  YOU HAD AN ENDOSCOPIC PROCEDURE TODAY AT THE [facility]:   Refer to the procedure report that was given to you for any specific questions about what was found during the examination.  If the procedure report does not answer your questions, please call your gastroenterologist to clarify.  If you requested that your care partner not be given the details of your procedure findings, then the procedure report has been included in a sealed envelope for you to review at your convenience later.  YOU SHOULD EXPECT: Some feelings of bloating in the abdomen. Passage of more gas than usual.  Walking can help get rid of the air that was put into your GI tract during the procedure and reduce the bloating. If you had a lower endoscopy (such as a colonoscopy or flexible sigmoidoscopy) you may notice spotting of blood in your stool or on the toilet paper. If you underwent a bowel prep for your procedure, you may not have a normal bowel movement for a few days.  Please Note:  You might notice some irritation and congestion in your nose or some drainage.  This is from the oxygen used during your procedure.  There is no need for concern and it should clear up in a day or so.  SYMPTOMS TO REPORT IMMEDIATELY:   Following lower endoscopy (colonoscopy or flexible sigmoidoscopy):  Excessive amounts of blood in the stool  Significant tenderness or worsening of abdominal pains  Swelling of the abdomen that is new, acute  Fever of 100F or higher  For urgent or emergent issues, a gastroenterologist can be reached at any hour by calling [phone].   DIET:  We do recommend a small meal at first, but then you may proceed to your regular diet.  Drink plenty of fluids but you should avoid alcoholic beverages for 24 hours.  ACTIVITY:  You should plan to take it easy for the rest of today and you should NOT DRIVE or use heavy machinery until tomorrow  (because of the sedation medicines used during the test).    FOLLOW UP: Our staff will call the number listed on your records the next business day following your procedure to check on you and address any questions or concerns that you may have regarding the information given to you following your procedure. If we do not reach you, we will leave a message.  However, if you are feeling well and you are not experiencing any problems, there is no need to return our call.  We will assume that you have returned to your regular daily activities without incident.  If any biopsies were taken you will be contacted by phone or by letter within the next 1-3 weeks.  Please call us at [phone] if you have not heard about the biopsies in 3 weeks.    SIGNATURES/CONFIDENTIALITY: You and/or your care partner have signed paperwork which will be entered into your electronic medical record.  These signatures attest to the fact that that the information above on your After Visit Summary has been reviewed and is understood.  Full responsibility of the confidentiality of this discharge information lies with you and/or your care-partner. 

## 2018-05-28 NOTE — Op Note (Signed)
[place] Patient Name: Tracy Paul Procedure Date: 05/28/2018 8:07 AM MRN: [id number] Endoscopist: Mauri Pole , MD Age: 67 Referring MD:  Date of Birth: 03-Aug-1951 Gender: Female Account #: [id number] Procedure:                Colonoscopy Indications:              High risk colon cancer surveillance: Personal                            history of colonic polyps, Surveillance: History of                            adenomatous polyps, inadequate prep on last exam                            (<69yr), High risk colon cancer surveillance:                            Personal history of adenoma (10 mm or greater in                            size) Medicines:                Monitored Anesthesia Care Procedure:                Pre-Anesthesia Assessment:                           - Prior to the procedure, a History and Physical                            was performed, and patient medications and                            allergies were reviewed. The patient's tolerance of                            previous anesthesia was also reviewed. The risks                            and benefits of the procedure and the sedation                            options and risks were discussed with the patient.                            All questions were answered, and informed consent                            was obtained. Prior Anticoagulants: The patient has                            taken no previous anticoagulant or antiplatelet  agents. ASA Grade Assessment: II - A patient with                            mild systemic disease. After reviewing the risks                            and benefits, the patient was deemed in                            satisfactory condition to undergo the procedure.                           After obtaining informed consent, the colonoscope                            was passed under direct vision. Throughout the                  procedure, the patient's blood pressure, pulse, and                            oxygen saturations were monitored continuously. The                            Colonoscope was introduced through the anus and                            advanced to the the cecum, identified by                            appendiceal orifice and ileocecal valve. The                            colonoscopy was somewhat difficult due to a                            redundant colon, significant looping and a tortuous                            colon. Successful completion of the procedure was                            aided by applying abdominal pressure. The patient                            tolerated the procedure well. The quality of the                            bowel preparation was adequate. The ileocecal                            valve, appendiceal orifice, and rectum were                            photographed. Scope  In: 8:11:08 AM Scope Out: 8:43:25 AM Scope Withdrawal Time: 0 hours 12 minutes 2 seconds  Total Procedure Duration: 0 hours 32 minutes 17 seconds  Findings:                 The perianal and digital rectal examinations were                            normal.                           A 3 mm polyp was found in the cecum. The polyp was                            sessile. The polyp was removed with a cold biopsy                            forceps. Resection and retrieval were complete.                           A 5 mm polyp was found in the splenic flexure. The                            polyp was sessile. The polyp was removed with a                            cold snare. Resection and retrieval were complete.                           Scattered small and large-mouthed diverticula were                            found in the sigmoid colon, descending colon,                            transverse colon, ascending colon and cecum. There                            was evidence of  diverticular spasm. There was                            evidence of an impacted diverticulum.                           Non-bleeding internal hemorrhoids were found during                            retroflexion. The hemorrhoids were small.                           The exam was otherwise without abnormality. Complications:            No immediate complications. Estimated Blood Loss:     Estimated blood loss was minimal. Impression:               - One  3 mm polyp in the cecum, removed with a cold                            biopsy forceps. Resected and retrieved.                           - One 5 mm polyp at the splenic flexure, removed                            with a cold snare. Resected and retrieved.                           - Moderate diverticulosis in the sigmoid colon, in                            the descending colon, in the transverse colon, in                            the ascending colon and in the cecum. There was                            evidence of diverticular spasm. There was evidence                            of an impacted diverticulum.                           - Non-bleeding internal hemorrhoids.                           - The examination was otherwise normal. Recommendation:           - Patient has a contact number available for                            emergencies. The signs and symptoms of potential                            delayed complications were discussed with the                            patient. Return to normal activities tomorrow.                            Written discharge instructions were provided to the                            patient.                           - Resume previous diet.                           - Continue present medications.                           -  Await pathology results.                           - Repeat colonoscopy in 3 years for surveillance                            based on pathology results. Mauri Pole, MD 05/28/2018 8:50:41 AM This report has been signed electronically.

## 2018-05-28 NOTE — Progress Notes (Signed)
Report given to PACU, vss 

## 2018-05-28 NOTE — Progress Notes (Signed)
HR down to 34 to 40 with junctional beats noted, vss. Dr Silverio Decamp updated. Robinal  .2 mg given IV before proceducer

## 2018-05-28 NOTE — Progress Notes (Signed)
Called to room to assist during endoscopic procedure.  Patient ID and intended procedure confirmed with present staff. Received instructions for my participation in the procedure from the performing physician.  

## 2018-05-29 ENCOUNTER — Telehealth: Payer: Self-pay

## 2018-05-29 NOTE — Telephone Encounter (Signed)
Left message

## 2018-05-29 NOTE — Telephone Encounter (Signed)
Attempted to reach pt. With follow-up call following endoscopic procedure 05/28/2018.   LM on pt. Voice mail to call if she has any questions or concerns.

## 2018-06-12 ENCOUNTER — Encounter: Payer: Self-pay | Admitting: Gastroenterology

## 2018-06-20 ENCOUNTER — Ambulatory Visit: Payer: Self-pay | Admitting: Adult Health

## 2018-06-24 ENCOUNTER — Encounter: Payer: Self-pay | Admitting: Cardiology

## 2018-07-14 ENCOUNTER — Encounter (HOSPITAL_COMMUNITY): Payer: Self-pay

## 2018-07-15 ENCOUNTER — Ambulatory Visit: Payer: Federal, State, Local not specified - PPO | Admitting: Cardiology

## 2018-07-30 ENCOUNTER — Encounter: Payer: Self-pay | Admitting: Internal Medicine

## 2018-07-30 NOTE — Patient Instructions (Signed)

## 2018-07-30 NOTE — Progress Notes (Signed)
[facility] [name], M.D.     [name]. [name], P.A.-C [name], [place] [address] [place], [place] [address] Telephone [phone] Telefax [phone] Annual Screening/Preventative Visit & Comprehensive Evaluation &  Examination     This very nice 67 y.o. MWF presents for a Screening /Preventative Visit & comprehensive evaluation and management of multiple medical co-morbidities.  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 Patient continues to c/o RLQ pains.      HTN predates since 1995. Patient's BP has been controlled at home and patient denies any cardiac symptoms as chest pain, palpitations, shortness of breath, dizziness or ankle swelling. Today's BP is elevated at 148/88. Patient had colonoscopy 7/31/2-019 with HR dropping down in the 30's and she was referred for f/u consultation with Dr Percival Spanish scheduled Oct 31. Patient has had no problems since the procedure as postural dizziness, CP or dyspnea.  Patient has had Normal Heart Caths x 2 in 1995/2000.      Patient's hyperlipidemia is controlled with diet and medications. Patient denies myalgias or other medication SE's. 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 In 2013 she underwent a Roux-en-Y Gastric Bipass and lost from 273# - down 120# to 153# essentially curing her Diabetes, but weight began creeping up again to 177# in May 2019 and 179 # today.  She denies reactive hypoglycemic symptoms, visual blurring, diabetic polys or paresthesias. Last A1c was Normal  Lab Results  Component Value Date   HGBA1C 5.5 07/31/2018      Patient has been on Thyroid Replacement since 2008.      Finally, patient has history of Vitamin D  Deficiency ("28"/2008) and last Vitamin D was near goal (70-100): Lab Results  Component Value Date   VD25OH 61 07/31/2018   Current Outpatient Medications on File Prior to Visit  Medication Sig  . acyclovir (ZOVIRAX) 400 MG tablet Take 1 tablet (400 mg total) by mouth daily.  . Ascorbic Acid (VITAMIN C) 500 MG CAPS Take by mouth daily.  Marland Kitchen aspirin 81 MG tablet Take 81 mg by mouth daily.  . Biotin 10 MG TABS Take 10,000 each by mouth daily.  . Calcium Citrate-Vitamin D (CALCIUM CITRATE + D3 PO) Take 600 mg by mouth daily.   . cyclobenzaprine (FLEXERIL) 10 MG tablet Take 1 tablet (10 mg total) by mouth 3 (three) times daily as needed for muscle spasms.  . diphenhydramine-acetaminophen (TYLENOL PM) 25-500 MG TABS tablet Take 1 tablet by mouth at bedtime as needed.  . Ferrous Sulfate (SLOW RELEASE IRON PO) Take by mouth daily.  . fish oil-omega-3 fatty acids 1000 MG capsule Take 1 g by mouth daily.  . fluticasone (FLONASE) 50 MCG/ACT nasal spray Place 2 sprays into both nostrils daily.  Marland Kitchen gabapentin (NEURONTIN) 100 MG capsule Take 1 capsule 4 x / day for Neuropathy Ppain  . levothyroxine (SYNTHROID, LEVOTHROID) 75 MCG tablet TAKE ONE TABLET BY MOUTH ONCE DAILY  . Magnesium 250 MG TABS Take by mouth daily.  . Melatonin 10 MG TABS Take 1 tablet by mouth at bedtime.  . meloxicam (MOBIC) 15 MG tablet Take 1 tablet (15 mg total) by mouth daily.  Marland Kitchen omeprazole (PRILOSEC)  20 MG capsule Take 1 capsule (20 mg total) by mouth daily.  Marland Kitchen OVER THE COUNTER MEDICATION Takes Allegra 25 mg at bedtime.  . potassium chloride (K-DUR) 10 MEQ tablet TAKE 1 TABLET BY MOUTH TWICE DAILY  . pravastatin (PRAVACHOL) 40 MG tablet TAKE ONE TABLET BY MOUTH ONCE DAILY  . rOPINIRole (REQUIP) 0.5 MG tablet TAKE ONE-HALF (1/2) TABLET AT NIGHT. CAN INCREASE TO TAKE ONE TABLET, THEN MAY INCREASE UP TO 4 TABLETS AT NIGHT AS DIRECTED.  Marland Kitchen sucralfate (CARAFATE) 1 g tablet Take 1 tablet (1 g total) by mouth 3 (three) times daily as  needed.  . vitamin B-12 (CYANOCOBALAMIN) 1000 MCG tablet Take 1,000 mcg by mouth daily.   . Vitamin D, Ergocalciferol, (DRISDOL) 50000 units CAPS capsule TAKE 1 CAPSULE BY MOUTH ONCE DAILY   No current facility-administered medications on file prior to visit.    Allergies  Allergen Reactions  . Codeine Other (See Comments)    "puts me out of it".  Dysphoria.  Patient tolerates Tramadol without complications.  . Nabumetone     GI upset   Past Medical History:  Diagnosis Date  . Allergic rhinitis   . Arthritis   . Cholecystitis, chronic   . Dysrhythmia    Hx bradycardia - hx normal cardiac cath x2 / ECHO 2009 EF 60% LAE MYOVIEW 3/12 EF 60% NO ISCHEMIA  . Hyperlipidemia   . Hypertension    no treatment after 120 lb weight loss  . Hypothyroidism   . Prediabetes   . Thyroid nodule    Health Maintenance  Topic Date Due  . MAMMOGRAM  07/25/2014  . INFLUENZA VACCINE  05/29/2018  . URINE MICROALBUMIN  08/01/2019  . PNA vac Low Risk Adult (2 of 2 - PPSV23) 08/01/2019  . COLONOSCOPY  05/29/2023  . TETANUS/TDAP  05/23/2026  . DEXA SCAN  Completed  . Hepatitis C Screening  Completed   Immunization History  Administered Date(s) Administered  . Influenza Split 08/25/2013, 09/02/2014, 08/10/2015  . Influenza, High Dose Seasonal PF 07/31/2017  . Influenza,inj,quad, With Preservative 08/10/2016  . PPD Test 02/23/2014, 05/23/2016  . Pneumococcal Conjugate-13 07/31/2018  . Pneumococcal Polysaccharide-23 10/18/2008  . Td 08/08/2006  . Tdap 05/23/2016  . Zoster 10/15/2012   Last Colon - 05/28/2018 - Dr Silverio Decamp - small polyps - recc 3 yr f/u - due July 2022.  Last MGM - last May 2013 at [facility] & patient strongly advised ro reschedule   Past Surgical History:  Procedure Laterality Date  . Bilateral carpal tunnel surgery    . BREATH TEK H PYLORI  07/01/2012   Procedure: BREATH TEK H PYLORI;  Surgeon: Edward Jolly, MD;  Location: [facility];  Service: General;  Laterality: N/A;   . CHOLECYSTECTOMY N/A 11/27/2013   Procedure: LAPAROSCOPIC CHOLECYSTECTOMY WITH INTRAOPERATIVE CHOLANGIOGRAM;  Surgeon: Pedro Earls, MD;  Location: [facility];  Service: General;  Laterality: N/A;  . GASTRIC ROUX-EN-Y N/A 12/16/2012   Procedure: LAPAROSCOPIC ROUX-EN-Y GASTRIC BYPASS WITH UPPER ENDOSCOPY;  Surgeon: Pedro Earls, MD;  Location: [facility];  Service: General;  Laterality: N/A;  Gastric Bypass  . NASAL SINUS SURGERY    . REPLACEMENT TOTAL KNEE BILATERAL     bilat  . TUBAL LIGATION     Family History  Problem Relation Age of Onset  . Breast cancer Mother   . Cancer Mother        breast cancer  . Anuerysm Mother        brain anuerysm  . Heart  attack Father 30  . Heart attack Sister 59  . Colon cancer Neg Hx   . Stomach cancer Neg Hx   . Rectal cancer Neg Hx   . Esophageal cancer Neg Hx    Social History   Tobacco Use  . Smoking status: Former Smoker    Packs/day: 0.80    Years: 10.00    Pack years: 8.00    Types: Cigarettes    Last attempt to quit: 10/30/1979    Years since quitting: 38.7  . Smokeless tobacco: Never Used  Substance Use Topics  . Alcohol use: No  . Drug use: No    ROS Constitutional: Denies fever, chills, weight loss/gain, headaches, insomnia,  night sweats, and change in appetite. Does c/o fatigue. Eyes: Denies redness, blurred vision, diplopia, discharge, itchy, watery eyes.  ENT: Denies discharge, congestion, post nasal drip, epistaxis, sore throat, earache, hearing loss, dental pain, Tinnitus, Vertigo, Sinus pain, snoring.  Cardio: Denies chest pain, palpitations, irregular heartbeat, syncope, dyspnea, diaphoresis, orthopnea, PND, claudication, edema Respiratory: denies cough, dyspnea, DOE, pleurisy, hoarseness, laryngitis, wheezing.  Gastrointestinal: Denies dysphagia, heartburn, reflux, water brash, pain, cramps, nausea, vomiting, bloating, diarrhea, constipation, hematemesis, melena, hematochezia, jaundice, hemorrhoids Genitourinary:  Denies dysuria, frequency, urgency, nocturia, hesitancy, discharge, hematuria, flank pain Breast: Breast lumps, nipple discharge, bleeding.  Musculoskeletal: Denies arthralgia, myalgia, stiffness, Jt. Swelling, pain, limp, and strain/sprain. Denies falls. Skin: Denies puritis, rash, hives, warts, acne, eczema, changing in skin lesion Neuro: No weakness, tremor, incoordination, spasms, paresthesia, pain Psychiatric: Denies confusion, memory loss, sensory loss. Denies Depression. Endocrine: Denies change in weight, skin, hair change, nocturia, and paresthesia, diabetic polys, visual blurring, hyper / hypo glycemic episodes.  Heme/Lymph: No excessive bleeding, bruising, enlarged lymph nodes.  Physical Exam  BP (!) 148/88   Pulse (!) 56   Temp (!) 97.3 F (36.3 C)   Resp 16   Ht 5' 3.5" (1.613 m)   Wt 179 lb (81.2 kg)   LMP  (LMP Unknown)   BMI 31.21 kg/m   General Appearance: Over nourished, well groomed and in no apparent distress.  Eyes: PERRLA, EOMs, conjunctiva no swelling or erythema, normal fundi and vessels. Sinuses: No frontal/maxillary tenderness ENT/Mouth: EACs patent / TMs  nl. Nares clear without erythema, swelling, mucoid exudates. Oral hygiene is good. No erythema, swelling, or exudate. Tongue normal, non-obstructing. Tonsils not swollen or erythematous. Hearing normal.  Neck: Supple, thyroid not palpable. No bruits, nodes or JVD. Respiratory: Respiratory effort normal.  BS equal and clear bilateral without rales, rhonci, wheezing or stridor. Cardio: Heart sounds are normal with regular rate and rhythm and no murmurs, rubs or gallops. Peripheral pulses are normal and equal bilaterally without edema. No aortic or femoral bruits. Chest: symmetric with normal excursions and percussion. Breasts: Symmetric, without lumps, nipple discharge, retractions, or fibrocystic changes.  Abdomen: Rotund, soft with bowel sounds active. Still tender Rt laterano guarding, rebound, hernias,  masses, or organomegaly.  Lymphatics: Non tender without lymphadenopathy.  Musculoskeletal: Full ROM all peripheral extremities, joint stability, 5/5 strength, and normal gait. Skin: Warm and dry without rashes, lesions, cyanosis, clubbing or  ecchymosis.  Neuro: Cranial nerves intact, reflexes equal bilaterally. Normal muscle tone, no cerebellar symptoms. Sensation intact.  Pysch: Alert and oriented X 3, normal affect, Insight and Judgment appropriate.   Assessment and Plan  1. Annual Preventative Screening Examination  2. Essential hypertension  - EKG 12-Lead - Urinalysis, Routine w reflex microscopic - Microalbumin / creatinine urine ratio - CBC with Differential/Platelet - COMPLETE METABOLIC PANEL WITH  GFR - Magnesium - TSH  3. Hyperlipidemia, mixed  - EKG 12-Lead - Lipid panel - TSH  4. Type 2 diabetes mellitus with stage 2 chronic kidney disease, without long-term current use of insulin (HCC)  - EKG 12-Lead - HM DIABETES FOOT EXAM - LOW EXTREMITY NEUR EXAM DOCUM - Hemoglobin A1c - Insulin, random  5. Vitamin D deficiency  - VITAMIN D 25 Hydroxyl  6. Hypothyroidism m  - TSH  7. 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 Aortic atherosclerosis (HCC)  - EKG 12-Lead  9. Screening for ischemic heart disease  - EKG 12-Lead - Lipid panel  10. Former smoker  - EKG 12-Lead  11. FHx: heart disease  - EKG 12-Lead  12. RLQ abdominal pain  - CT Abdomen Pelvis W Contrast; Future  13. Need for prophylactic vaccination against Streptococcus pneumoniae (pneumococcus)  - pneumococcal 13-valent conjugate vaccine (PREVNAR 13) injection 0.5 mL - Pneumococcal conjugate vaccine 13-valent - Urinalysis, Routine w reflex microscopic - Microalbumin / creatinine urine ratio - CBC with Differential/Platelet - COMPLETE METABOLIC PANEL WITH GFR - Magnesium - Lipid panel - TSH - Hemoglobin A1c - Insulin, random - VITAMIN D 25 Hydroxyl  14. Screening for colorectal  cancer  - POC Hemoccult Bld/Stll  15. Medication management        Patient was counseled in prudent diet to achieve/maintain BMI less than 25 for weight control, BP monitoring, regular exercise and medications. Discussed med's effects and SE's. Screening labs and tests as requested with regular follow-up as recommended. Over 40 minutes of exam, counseling, chart review and high complex critical decision making was performed.

## 2018-07-31 ENCOUNTER — Ambulatory Visit (INDEPENDENT_AMBULATORY_CARE_PROVIDER_SITE_OTHER): Payer: Federal, State, Local not specified - PPO | Admitting: Internal Medicine

## 2018-07-31 VITALS — BP 148/88 | HR 56 | Temp 97.3°F | Resp 16 | Ht 63.5 in | Wt 179.0 lb

## 2018-07-31 DIAGNOSIS — I10 Essential (primary) hypertension: Secondary | ICD-10-CM

## 2018-07-31 DIAGNOSIS — Z1389 Encounter for screening for other disorder: Secondary | ICD-10-CM

## 2018-07-31 DIAGNOSIS — E559 Vitamin D deficiency, unspecified: Secondary | ICD-10-CM

## 2018-07-31 DIAGNOSIS — Z0000 Encounter for general adult medical examination without abnormal findings: Secondary | ICD-10-CM

## 2018-07-31 DIAGNOSIS — Z131 Encounter for screening for diabetes mellitus: Secondary | ICD-10-CM

## 2018-07-31 DIAGNOSIS — Z136 Encounter for screening for cardiovascular disorders: Secondary | ICD-10-CM

## 2018-07-31 DIAGNOSIS — I700 Atherosclerosis of aorta: Secondary | ICD-10-CM

## 2018-07-31 DIAGNOSIS — E782 Mixed hyperlipidemia: Secondary | ICD-10-CM

## 2018-07-31 DIAGNOSIS — Z8249 Family history of ischemic heart disease and other diseases of the circulatory system: Secondary | ICD-10-CM

## 2018-07-31 DIAGNOSIS — Z1212 Encounter for screening for malignant neoplasm of rectum: Secondary | ICD-10-CM

## 2018-07-31 DIAGNOSIS — E039 Hypothyroidism, unspecified: Secondary | ICD-10-CM

## 2018-07-31 DIAGNOSIS — Z1329 Encounter for screening for other suspected endocrine disorder: Secondary | ICD-10-CM

## 2018-07-31 DIAGNOSIS — R1031 Right lower quadrant pain: Secondary | ICD-10-CM

## 2018-07-31 DIAGNOSIS — Z23 Encounter for immunization: Secondary | ICD-10-CM

## 2018-07-31 DIAGNOSIS — Z9884 Bariatric surgery status: Secondary | ICD-10-CM

## 2018-07-31 DIAGNOSIS — Z79899 Other long term (current) drug therapy: Secondary | ICD-10-CM | POA: Diagnosis not present

## 2018-07-31 DIAGNOSIS — Z87891 Personal history of nicotine dependence: Secondary | ICD-10-CM

## 2018-07-31 DIAGNOSIS — Z13220 Encounter for screening for lipoid disorders: Secondary | ICD-10-CM | POA: Diagnosis not present

## 2018-07-31 DIAGNOSIS — N182 Chronic kidney disease, stage 2 (mild): Secondary | ICD-10-CM

## 2018-07-31 DIAGNOSIS — Z1211 Encounter for screening for malignant neoplasm of colon: Secondary | ICD-10-CM

## 2018-07-31 DIAGNOSIS — E1122 Type 2 diabetes mellitus with diabetic chronic kidney disease: Secondary | ICD-10-CM

## 2018-07-31 DIAGNOSIS — Z0001 Encounter for general adult medical examination with abnormal findings: Secondary | ICD-10-CM

## 2018-07-31 MED ORDER — PNEUMOCOCCAL 13-VAL CONJ VACC IM SUSP: 0.5000 mL | INTRAMUSCULAR | Status: DC

## 2018-08-01 ENCOUNTER — Other Ambulatory Visit: Payer: Self-pay | Admitting: Internal Medicine

## 2018-08-01 DIAGNOSIS — N3000 Acute cystitis without hematuria: Secondary | ICD-10-CM

## 2018-08-01 LAB — CBC WITH DIFFERENTIAL/PLATELET
BASOS ABS: 71 {cells}/uL (ref 0–200)
BASOS PCT: 1.4 %
EOS ABS: 301 {cells}/uL (ref 15–500)
Eosinophils Relative: 5.9 %
HCT: 35.5 % (ref 35.0–45.0)
Hemoglobin: 11.9 g/dL (ref 11.7–15.5)
Lymphs Abs: 2504 cells/uL (ref 850–3900)
MCH: 30.9 pg (ref 27.0–33.0)
MCHC: 33.5 g/dL (ref 32.0–36.0)
MCV: 92.2 fL (ref 80.0–100.0)
MONOS PCT: 8.9 %
MPV: 11.4 fL (ref 7.5–12.5)
NEUTROS PCT: 34.7 %
Neutro Abs: 1770 cells/uL (ref 1500–7800)
PLATELETS: 275 10*3/uL (ref 140–400)
RBC: 3.85 10*6/uL (ref 3.80–5.10)
RDW: 12.3 % (ref 11.0–15.0)
TOTAL LYMPHOCYTE: 49.1 %
WBC: 5.1 10*3/uL (ref 3.8–10.8)
WBCMIX: 454 {cells}/uL (ref 200–950)

## 2018-08-01 LAB — MICROALBUMIN / CREATININE URINE RATIO
CREATININE, URINE: 72 mg/dL (ref 20–275)
Microalb Creat Ratio: 13 mcg/mg creat (ref <30)
Microalb, Ur: 0.9 mg/dL

## 2018-08-01 LAB — COMPLETE METABOLIC PANEL WITH GFR
AG RATIO: 1.7 (calc) (ref 1.0–2.5)
ALBUMIN MSPROF: 3.9 g/dL (ref 3.6–5.1)
ALT: 17 U/L (ref 6–29)
AST: 23 U/L (ref 10–35)
Alkaline phosphatase (APISO): 98 U/L (ref 33–130)
BUN: 11 mg/dL (ref 7–25)
CHLORIDE: 106 mmol/L (ref 98–110)
CO2: 26 mmol/L (ref 20–32)
Calcium: 8.9 mg/dL (ref 8.6–10.4)
Creat: 0.94 mg/dL (ref 0.50–0.99)
GFR, EST AFRICAN AMERICAN: 73 mL/min/{1.73_m2} (ref >=60)
GFR, Est Non African American: 63 mL/min/{1.73_m2} (ref >=60)
Globulin: 2.3 g/dL (calc) (ref 1.9–3.7)
Glucose, Bld: 88 mg/dL (ref 65–99)
POTASSIUM: 4.4 mmol/L (ref 3.5–5.3)
Sodium: 141 mmol/L (ref 135–146)
TOTAL PROTEIN: 6.2 g/dL (ref 6.1–8.1)
Total Bilirubin: 1.2 mg/dL (ref 0.2–1.2)

## 2018-08-01 LAB — URINALYSIS, ROUTINE W REFLEX MICROSCOPIC
BILIRUBIN URINE: NEGATIVE
Bacteria, UA: NONE SEEN /HPF
Glucose, UA: NEGATIVE
Hgb urine dipstick: NEGATIVE
Hyaline Cast: NONE SEEN /LPF
KETONES UR: NEGATIVE
NITRITE: NEGATIVE
Protein, ur: NEGATIVE
SPECIFIC GRAVITY, URINE: 1.011 (ref 1.001–1.03)
pH: <=5 (ref 5.0–8.0)

## 2018-08-01 LAB — LIPID PANEL
Cholesterol: 156 mg/dL (ref <200)
HDL: 62 mg/dL (ref >50)
LDL Cholesterol (Calc): 81 mg/dL (calc)
Non-HDL Cholesterol (Calc): 94 mg/dL (calc) (ref <130)
Total CHOL/HDL Ratio: 2.5 (calc) (ref <5.0)
Triglycerides: 57 mg/dL (ref <150)

## 2018-08-01 LAB — HEMOGLOBIN A1C
EAG (MMOL/L): 6.2 (calc)
HEMOGLOBIN A1C: 5.5 %{Hb} (ref <5.7)
Mean Plasma Glucose: 111 (calc)

## 2018-08-01 LAB — INSULIN, RANDOM: INSULIN: 2.4 u[IU]/mL (ref 2.0–19.6)

## 2018-08-01 LAB — VITAMIN D 25 HYDROXY (VIT D DEFICIENCY, FRACTURES): Vit D, 25-Hydroxy: 61 ng/mL (ref 30–100)

## 2018-08-01 LAB — TSH: TSH: 2.67 m[IU]/L (ref 0.40–4.50)

## 2018-08-01 LAB — MAGNESIUM: Magnesium: 2 mg/dL (ref 1.5–2.5)

## 2018-08-01 MED ORDER — CIPROFLOXACIN HCL 250 MG PO TABS: ORAL_TABLET | ORAL | 0 refills | Status: DC

## 2018-08-03 ENCOUNTER — Encounter: Payer: Self-pay | Admitting: Internal Medicine

## 2018-08-08 ENCOUNTER — Ambulatory Visit: Payer: Self-pay | Admitting: Internal Medicine

## 2018-08-08 ENCOUNTER — Ambulatory Visit: Payer: Federal, State, Local not specified - PPO | Admitting: Physician Assistant

## 2018-08-08 ENCOUNTER — Encounter: Payer: Self-pay | Admitting: Physician Assistant

## 2018-08-08 VITALS — BP 128/76 | HR 73 | Temp 97.4°F | Resp 14 | Ht 63.5 in | Wt 179.0 lb

## 2018-08-08 DIAGNOSIS — J0100 Acute maxillary sinusitis, unspecified: Secondary | ICD-10-CM | POA: Diagnosis not present

## 2018-08-08 DIAGNOSIS — I951 Orthostatic hypotension: Secondary | ICD-10-CM | POA: Diagnosis not present

## 2018-08-08 MED ORDER — PREDNISONE 20 MG PO TABS: ORAL_TABLET | ORAL | 0 refills | Status: DC

## 2018-08-08 MED ORDER — AZITHROMYCIN 250 MG PO TABS: ORAL_TABLET | ORAL | 1 refills | Status: AC

## 2018-08-08 NOTE — Progress Notes (Signed)
Assessment and Plan: Tracy Paul was seen today for follow-up and other.  Diagnoses and all orders for this visit:  Acute non-recurrent maxillary sinusitis -     azithromycin (ZITHROMAX) 250 MG tablet; Take 2 tablets (500 mg) on  Day 1,  followed by 1 tablet (250 mg) once daily on Days 2 through 5. -     predniSONE (DELTASONE) 20 MG tablet; 2 tablets daily for 3 days, 1 tablet daily for 4 days.  Orthostatic hypotension Stop the requip, follow up with cardiology Continue to monitor BP Push fluids     HPI 67 y.o.female presents for follow up for her blood pressure and HR.   She has had sinus issues x 2 weeks, worse with bending over, will cough productive green, sinus pressure, HA.   Weight is unchanged. Her BP is better and she states that her HR has been very erratic.  She has been taking her blood pressure sitting and standing, her BP will drop 10-20 points and her HR will increase from 44 to 58 or 67 to 101 for the highest. She goes to cardiology 29th of this month. ? If from the requip/gabapetin.    BP Readings from Last 3 Encounters:  08/08/18 128/76  07/31/18 (!) 148/88  05/28/18 135/70    BMI is Body mass index is 31.21 kg/m., she is working on diet and exercise. Wt Readings from Last 3 Encounters:  08/08/18 179 lb (81.2 kg)  07/31/18 179 lb (81.2 kg)  05/28/18 183 lb (83 kg)   Lab Results  Component Value Date   TSH 2.67 07/31/2018    Blood pressure 128/76, pulse 73, temperature (!) 97.4 F (36.3 C), resp. rate 14, height 5' 3.5" (1.613 m), weight 179 lb (81.2 kg), SpO2 98 %.   Past Medical History:  Diagnosis Date  . Allergic rhinitis   . Arthritis   . Cholecystitis, chronic   . Dysrhythmia    Hx bradycardia - hx normal cardiac cath x2 / ECHO 2009 EF 60% LAE MYOVIEW 3/12 EF 60% NO ISCHEMIA  . Hyperlipidemia   . Hypertension    no treatment after 120 lb weight loss  . Hypothyroidism   . Prediabetes   . Thyroid nodule      Allergies  Allergen  Reactions  . Codeine Other (See Comments)    "puts me out of it".  Dysphoria.  Patient tolerates Tramadol without complications.  . Nabumetone     GI upset    Current Outpatient Medications on File Prior to Visit  Medication Sig  . acyclovir (ZOVIRAX) 400 MG tablet Take 1 tablet (400 mg total) by mouth daily.  . Ascorbic Acid (VITAMIN C) 500 MG CAPS Take by mouth daily.  Marland Kitchen aspirin 81 MG tablet Take 81 mg by mouth daily.  . Biotin 10 MG TABS Take 10,000 each by mouth daily.  . Calcium Citrate-Vitamin D (CALCIUM CITRATE + D3 PO) Take 600 mg by mouth daily.   . ciprofloxacin (CIPRO) 250 MG tablet Take 1 tablet 2 x / day with food for Infection  . cyclobenzaprine (FLEXERIL) 10 MG tablet Take 1 tablet (10 mg total) by mouth 3 (three) times daily as needed for muscle spasms.  . diphenhydramine-acetaminophen (TYLENOL PM) 25-500 MG TABS tablet Take 1 tablet by mouth at bedtime as needed.  . Ferrous Sulfate (SLOW RELEASE IRON PO) Take by mouth daily.  . fish oil-omega-3 fatty acids 1000 MG capsule Take 1 g by mouth daily.  . fluticasone (FLONASE) 50 MCG/ACT nasal spray  Place 2 sprays into both nostrils daily.  Marland Kitchen gabapentin (NEURONTIN) 100 MG capsule Take 1 capsule 4 x / day for Neuropathy Ppain  . levothyroxine (SYNTHROID, LEVOTHROID) 75 MCG tablet TAKE ONE TABLET BY MOUTH ONCE DAILY  . Magnesium 250 MG TABS Take by mouth daily.  . Melatonin 10 MG TABS Take 1 tablet by mouth at bedtime.  . meloxicam (MOBIC) 15 MG tablet Take 1 tablet (15 mg total) by mouth daily.  Marland Kitchen omeprazole (PRILOSEC) 20 MG capsule Take 1 capsule (20 mg total) by mouth daily.  Marland Kitchen OVER THE COUNTER MEDICATION Takes Allegra 25 mg at bedtime.  . potassium chloride (K-DUR) 10 MEQ tablet TAKE 1 TABLET BY MOUTH TWICE DAILY  . pravastatin (PRAVACHOL) 40 MG tablet TAKE ONE TABLET BY MOUTH ONCE DAILY  . rOPINIRole (REQUIP) 0.5 MG tablet TAKE ONE-HALF (1/2) TABLET AT NIGHT. CAN INCREASE TO TAKE ONE TABLET, THEN MAY INCREASE UP TO 4  TABLETS AT NIGHT AS DIRECTED.  Marland Kitchen sucralfate (CARAFATE) 1 g tablet Take 1 tablet (1 g total) by mouth 3 (three) times daily as needed.  . vitamin B-12 (CYANOCOBALAMIN) 1000 MCG tablet Take 1,000 mcg by mouth daily.   . Vitamin D, Ergocalciferol, (DRISDOL) 50000 units CAPS capsule TAKE 1 CAPSULE BY MOUTH ONCE DAILY   No current facility-administered medications on file prior to visit.     ROS: all negative except above.   Physical Exam: Filed Weights   08/08/18 1140  Weight: 179 lb (81.2 kg)   BP 128/76   Pulse 73   Temp (!) 97.4 F (36.3 C)   Resp 14   Ht 5' 3.5" (1.613 m)   Wt 179 lb (81.2 kg)   LMP  (LMP Unknown)   SpO2 98%   BMI 31.21 kg/m  General Appearance: Well nourished, in no apparent distress. Eyes: PERRLA, EOMs, conjunctiva no swelling or erythema Sinuses: + Frontal/maxillary tenderness ENT/Mouth: Ext aud canals clear, TMs without erythema, bulging. No erythema, swelling, or exudate on post pharynx.  Tonsils not swollen or erythematous. Hearing normal.  Neck: left inferior fossa fullness. Supple, thyroid normal.  Respiratory: Respiratory effort normal, BS equal bilaterally without rales, rhonchi, wheezing or stridor.  Cardio: RRR with no MRGs. Brisk peripheral pulses without edema.  Abdomen: Soft, + BS.  Non tender, no guarding, rebound, hernias, masses. Lymphatics: Non tender without lymphadenopathy.  Musculoskeletal: Full ROM, 5/5 strength, normal gait.  Skin: Warm, dry without rashes, lesions, ecchymosis.  Neuro: Cranial nerves intact. Normal muscle tone, no cerebellar symptoms. Sensation intact.  Psych: Awake and oriented X 3, normal affect, Insight and Judgment appropriate.     Vicie Mutters, PA-C 12:11 PM [facility]

## 2018-08-08 NOTE — Patient Instructions (Signed)
Stop requip for a week and see if this helps Follow up with cardiology   Orthostatic Hypotension Orthostatic hypotension is a sudden drop in blood pressure that happens when you quickly change positions, such as when you get up from a seated or lying position. Blood pressure is a measurement of how strongly, or weakly, your blood is pressing against the walls of your arteries. Arteries are blood vessels that carry blood from your heart throughout your body. When blood pressure is too low, you may not get enough blood to your brain or to the rest of your organs. This can cause weakness, light-headedness, rapid heartbeat, and fainting. This can last for just a few seconds or for up to a few minutes. Orthostatic hypotension is usually not a serious problem. However, if it happens frequently or gets worse, it may be a sign of something more serious. What are the causes? This condition may be caused by:  Sudden changes in posture, such as standing up quickly after you have been sitting or lying down.  Blood loss.  Loss of body fluids (dehydration).  Heart problems.  Hormone (endocrine) problems.  Pregnancy.  Severe infection.  Lack of certain nutrients.  Severe allergic reactions (anaphylaxis).  Certain medicines, such as blood pressure medicine or medicines that make the body lose excess fluids (diuretics). Sometimes, this condition can be caused by not taking medicine as directed, such as taking too much of a certain medicine.  What increases the risk? Certain factors can make you more likely to develop orthostatic hypotension, including:  Age. Risk increases as you get older.  Conditions that affect the heart or the central nervous system.  Taking certain medicines, such as blood pressure medicine or diuretics.  Being pregnant.  What are the signs or symptoms? Symptoms of this condition may include:  Weakness.  Light-headedness.  Dizziness.  Blurred  vision.  Fatigue.  Rapid heartbeat.  Fainting, in severe cases.  How is this diagnosed? This condition is diagnosed based on:  Your medical history.  Your symptoms.  Your blood pressure measurement. Your health care provider will check your blood pressure when you are: ? Lying down. ? Sitting. ? Standing.  A blood pressure reading is recorded as two numbers, such as "120 over 80" (or 120/80). The first ("top") number is called the systolic pressure. It is a measure of the pressure in your arteries as your heart beats. The second ("bottom") number is called the diastolic pressure. It is a measure of the pressure in your arteries when your heart relaxes between beats. Blood pressure is measured in a unit called mm Hg. Healthy blood pressure for adults is 120/80. If your blood pressure is below 90/60, you may be diagnosed with hypotension. Other information or tests that may be used to diagnose orthostatic hypotension include:  Your other vital signs, such as your heart rate and temperature.  Blood tests.  Tilt table test. For this test, you will be safely secured to a table that moves you from a lying position to an upright position. Your heart rhythm and blood pressure will be monitored during the test.  How is this treated? Treatment for this condition may include:  Changing your diet. This may involve eating more salt (sodium) or drinking more water.  Taking medicines to raise your blood pressure.  Changing the dosage of certain medicines you are taking that might be lowering your blood pressure.  Wearing compression stockings. These stockings help to prevent blood clots and reduce swelling in  your legs.  In some cases, you may need to go to the hospital for:  Fluid replacement. This means you will receive fluids through an IV tube.  Blood replacement. This means you will receive donated blood through an IV tube (transfusion).  Treating an infection or heart problems,  if this applies.  Monitoring. You may need to be monitored while medicines that you are taking wear off.  Follow these instructions at home: Eating and drinking   Drink enough fluid to keep your urine clear or pale yellow.  Eat a healthy diet and follow instructions from your health care provider about eating or drinking restrictions. A healthy diet includes: ? Fresh fruits and vegetables. ? Whole grains. ? Lean meats. ? Low-fat dairy products.  Eat extra salt only as directed. Do not add extra salt to your diet unless your health care provider told you to do that.  Eat frequent, small meals.  Avoid standing up suddenly after eating. Medicines  Take over-the-counter and prescription medicines only as told by your health care provider. ? Follow instructions from your health care provider about changing the dosage of your current medicines, if this applies. ? Do not stop or adjust any of your medicines on your own. General instructions  Wear compression stockings as told by your health care provider.  Get up slowly from lying down or sitting positions. This gives your blood pressure a chance to adjust.  Avoid hot showers and excessive heat as directed by your health care provider.  Return to your normal activities as told by your health care provider. Ask your health care provider what activities are safe for you.  Do not use any products that contain nicotine or tobacco, such as cigarettes and e-cigarettes. If you need help quitting, ask your health care provider.  Keep all follow-up visits as told by your health care provider. This is important. Contact a health care provider if:  You vomit.  You have diarrhea.  You have a fever for more than 2-3 days.  You feel more thirsty than usual.  You feel weak and tired. Get help right away if:  You have chest pain.  You have a fast or irregular heartbeat.  You develop numbness in any part of your body.  You cannot  move your arms or your legs.  You have trouble speaking.  You become sweaty or feel lightheaded.  You faint.  You feel short of breath.  You have trouble staying awake.  You feel confused. This information is not intended to replace advice given to you by your health care provider. Make sure you discuss any questions you have with your health care provider. Document Released: 10/05/2002 Document Revised: 07/03/2016 Document Reviewed: 04/06/2016 Elsevier Interactive Patient Education  2018 [employer].

## 2018-08-12 ENCOUNTER — Ambulatory Visit
Admission: RE | Admit: 2018-08-12 | Discharge: 2018-08-12 | Disposition: A | Discharge status: Home / Self Care | Payer: Federal, State, Local not specified - PPO | Source: Ambulatory Visit | Attending: Internal Medicine | Admitting: Internal Medicine

## 2018-08-12 DIAGNOSIS — R1031 Right lower quadrant pain: Secondary | ICD-10-CM

## 2018-08-12 MED ORDER — IOPAMIDOL (ISOVUE-300) INJECTION 61%
100.0000 mL | Freq: Once | INTRAVENOUS | Status: AC | PRN
  Administered 2018-08-12: 100 mL via INTRAVENOUS

## 2018-08-20 ENCOUNTER — Ambulatory Visit (INDEPENDENT_AMBULATORY_CARE_PROVIDER_SITE_OTHER): Payer: Federal, State, Local not specified - PPO

## 2018-08-20 ENCOUNTER — Observation Stay (HOSPITAL_COMMUNITY)
Admission: EM | Admit: 2018-08-20 | Discharge: 2018-08-22 | Disposition: A | Discharge status: Home / Self Care | Payer: Federal, State, Local not specified - PPO | Attending: Internal Medicine | Admitting: Internal Medicine

## 2018-08-20 ENCOUNTER — Emergency Department (HOSPITAL_COMMUNITY): Payer: Federal, State, Local not specified - PPO

## 2018-08-20 ENCOUNTER — Encounter (HOSPITAL_COMMUNITY): Payer: Self-pay

## 2018-08-20 VITALS — Temp 98.6°F | Ht 63.5 in | Wt 181.0 lb

## 2018-08-20 DIAGNOSIS — K219 Gastro-esophageal reflux disease without esophagitis: Secondary | ICD-10-CM | POA: Diagnosis not present

## 2018-08-20 DIAGNOSIS — M25512 Pain in left shoulder: Secondary | ICD-10-CM

## 2018-08-20 DIAGNOSIS — I10 Essential (primary) hypertension: Secondary | ICD-10-CM | POA: Diagnosis not present

## 2018-08-20 DIAGNOSIS — Z79899 Other long term (current) drug therapy: Secondary | ICD-10-CM | POA: Diagnosis not present

## 2018-08-20 DIAGNOSIS — I209 Angina pectoris, unspecified: Secondary | ICD-10-CM

## 2018-08-20 DIAGNOSIS — Z87891 Personal history of nicotine dependence: Secondary | ICD-10-CM | POA: Diagnosis not present

## 2018-08-20 DIAGNOSIS — D509 Iron deficiency anemia, unspecified: Secondary | ICD-10-CM | POA: Diagnosis present

## 2018-08-20 DIAGNOSIS — R0789 Other chest pain: Secondary | ICD-10-CM | POA: Diagnosis not present

## 2018-08-20 DIAGNOSIS — Z7982 Long term (current) use of aspirin: Secondary | ICD-10-CM | POA: Insufficient documentation

## 2018-08-20 DIAGNOSIS — Z96653 Presence of artificial knee joint, bilateral: Secondary | ICD-10-CM | POA: Diagnosis not present

## 2018-08-20 DIAGNOSIS — Z23 Encounter for immunization: Secondary | ICD-10-CM | POA: Diagnosis not present

## 2018-08-20 DIAGNOSIS — E785 Hyperlipidemia, unspecified: Secondary | ICD-10-CM | POA: Diagnosis not present

## 2018-08-20 DIAGNOSIS — R001 Bradycardia, unspecified: Secondary | ICD-10-CM | POA: Diagnosis present

## 2018-08-20 DIAGNOSIS — E039 Hypothyroidism, unspecified: Secondary | ICD-10-CM | POA: Diagnosis present

## 2018-08-20 DIAGNOSIS — J069 Acute upper respiratory infection, unspecified: Secondary | ICD-10-CM

## 2018-08-20 DIAGNOSIS — R079 Chest pain, unspecified: Secondary | ICD-10-CM

## 2018-08-20 LAB — BASIC METABOLIC PANEL
ANION GAP: 5 (ref 5–15)
BUN: 11 mg/dL (ref 8–23)
CHLORIDE: 109 mmol/L (ref 98–111)
CO2: 25 mmol/L (ref 22–32)
CREATININE: 0.96 mg/dL (ref 0.44–1.00)
Calcium: 8.9 mg/dL (ref 8.9–10.3)
GFR calc non Af Amer: >60 mL/min (ref >60)
Glucose, Bld: 93 mg/dL (ref 70–99)
POTASSIUM: 4.4 mmol/L (ref 3.5–5.1)
SODIUM: 139 mmol/L (ref 135–145)

## 2018-08-20 LAB — I-STAT TROPONIN, ED: Troponin i, poc: 0.01 ng/mL (ref 0.00–0.08)

## 2018-08-20 LAB — CBC
HEMATOCRIT: 39.5 % (ref 36.0–46.0)
Hemoglobin: 11.9 g/dL — ABNORMAL LOW (ref 12.0–15.0)
MCH: 29.3 pg (ref 26.0–34.0)
MCHC: 30.1 g/dL (ref 30.0–36.0)
MCV: 97.3 fL (ref 80.0–100.0)
NRBC: 0 % (ref 0.0–0.2)
Platelets: 290 10*3/uL (ref 150–400)
RBC: 4.06 MIL/uL (ref 3.87–5.11)
RDW: 13.4 % (ref 11.5–15.5)
WBC: 10.5 10*3/uL (ref 4.0–10.5)

## 2018-08-20 NOTE — ED Triage Notes (Signed)
Pt reports that central CP started about an hour ago, with SOB, diaphoresis, radiation to back and nausea.

## 2018-08-20 NOTE — Progress Notes (Signed)
Pt presents for HD INFLUENZA Given in Lt arm Temp: 98.6

## 2018-08-21 ENCOUNTER — Observation Stay (HOSPITAL_COMMUNITY): Payer: Federal, State, Local not specified - PPO

## 2018-08-21 ENCOUNTER — Other Ambulatory Visit: Payer: Self-pay

## 2018-08-21 ENCOUNTER — Ambulatory Visit (HOSPITAL_BASED_OUTPATIENT_CLINIC_OR_DEPARTMENT_OTHER): Payer: Federal, State, Local not specified - PPO

## 2018-08-21 ENCOUNTER — Encounter (HOSPITAL_COMMUNITY): Payer: Self-pay | Admitting: Cardiology

## 2018-08-21 DIAGNOSIS — I700 Atherosclerosis of aorta: Secondary | ICD-10-CM | POA: Diagnosis not present

## 2018-08-21 DIAGNOSIS — R079 Chest pain, unspecified: Secondary | ICD-10-CM | POA: Diagnosis not present

## 2018-08-21 DIAGNOSIS — R0789 Other chest pain: Principal | ICD-10-CM

## 2018-08-21 DIAGNOSIS — I5030 Unspecified diastolic (congestive) heart failure: Secondary | ICD-10-CM | POA: Diagnosis not present

## 2018-08-21 DIAGNOSIS — E785 Hyperlipidemia, unspecified: Secondary | ICD-10-CM | POA: Diagnosis not present

## 2018-08-21 DIAGNOSIS — E039 Hypothyroidism, unspecified: Secondary | ICD-10-CM | POA: Diagnosis not present

## 2018-08-21 DIAGNOSIS — E782 Mixed hyperlipidemia: Secondary | ICD-10-CM

## 2018-08-21 DIAGNOSIS — D509 Iron deficiency anemia, unspecified: Secondary | ICD-10-CM

## 2018-08-21 DIAGNOSIS — R001 Bradycardia, unspecified: Secondary | ICD-10-CM | POA: Diagnosis present

## 2018-08-21 DIAGNOSIS — I2510 Atherosclerotic heart disease of native coronary artery without angina pectoris: Secondary | ICD-10-CM

## 2018-08-21 DIAGNOSIS — K219 Gastro-esophageal reflux disease without esophagitis: Secondary | ICD-10-CM | POA: Diagnosis not present

## 2018-08-21 DIAGNOSIS — I10 Essential (primary) hypertension: Secondary | ICD-10-CM | POA: Diagnosis not present

## 2018-08-21 DIAGNOSIS — I2584 Coronary atherosclerosis due to calcified coronary lesion: Secondary | ICD-10-CM

## 2018-08-21 LAB — TROPONIN I
Troponin I: <0.03 ng/mL (ref <0.03)
Troponin I: <0.03 ng/mL (ref <0.03)
Troponin I: <0.03 ng/mL (ref <0.03)

## 2018-08-21 LAB — LIPID PANEL
CHOLESTEROL: 156 mg/dL (ref 0–200)
HDL: 64 mg/dL (ref >40)
LDL Cholesterol: 85 mg/dL (ref 0–99)
Total CHOL/HDL Ratio: 2.4 RATIO
Triglycerides: 36 mg/dL (ref <150)
VLDL: 7 mg/dL (ref 0–40)

## 2018-08-21 LAB — HEMOGLOBIN A1C
Hgb A1c MFr Bld: 5.5 % (ref 4.8–5.6)
Mean Plasma Glucose: 111.15 mg/dL

## 2018-08-21 LAB — ECHOCARDIOGRAM COMPLETE
HEIGHTINCHES: 63 in
WEIGHTICAEL: 2872 [oz_av]

## 2018-08-21 LAB — HIV ANTIBODY (ROUTINE TESTING W REFLEX): HIV Screen 4th Generation wRfx: NONREACTIVE

## 2018-08-21 MED ORDER — ATORVASTATIN CALCIUM 40 MG PO TABS
40.0000 mg | ORAL_TABLET | Freq: Every day | ORAL | Status: DC
  Administered 2018-08-21: 40 mg via ORAL
  Filled 2018-08-21: qty 1

## 2018-08-21 MED ORDER — PANTOPRAZOLE SODIUM 40 MG PO TBEC
40.0000 mg | DELAYED_RELEASE_TABLET | Freq: Every day | ORAL | Status: DC
  Administered 2018-08-21 – 2018-08-22 (×2): 40 mg via ORAL
  Filled 2018-08-21 (×2): qty 1

## 2018-08-21 MED ORDER — NITROGLYCERIN 0.4 MG SL SUBL
SUBLINGUAL_TABLET | SUBLINGUAL | Status: AC
  Filled 2018-08-21: qty 2

## 2018-08-21 MED ORDER — NITROGLYCERIN 0.4 MG SL SUBL
0.4000 mg | SUBLINGUAL_TABLET | Freq: Once | SUBLINGUAL | Status: AC
  Administered 2018-08-21: 0.4 mg via SUBLINGUAL
  Filled 2018-08-21: qty 1

## 2018-08-21 MED ORDER — ASPIRIN 81 MG PO CHEW
81.0000 mg | CHEWABLE_TABLET | Freq: Every day | ORAL | Status: DC
  Administered 2018-08-21 – 2018-08-22 (×2): 81 mg via ORAL
  Filled 2018-08-21 (×2): qty 1

## 2018-08-21 MED ORDER — CYCLOBENZAPRINE HCL 10 MG PO TABS: 10.0000 mg | ORAL_TABLET | Freq: Three times a day (TID) | ORAL | Status: DC | PRN

## 2018-08-21 MED ORDER — ZOLPIDEM TARTRATE 5 MG PO TABS: 5.0000 mg | ORAL_TABLET | Freq: Every evening | ORAL | Status: DC | PRN

## 2018-08-21 MED ORDER — IOPAMIDOL (ISOVUE-370) INJECTION 76%
100.0000 mL | Freq: Once | INTRAVENOUS | Status: AC | PRN
  Administered 2018-08-21: 100 mL via INTRAVENOUS

## 2018-08-21 MED ORDER — MORPHINE SULFATE (PF) 2 MG/ML IV SOLN
2.0000 mg | INTRAVENOUS | Status: DC | PRN
  Administered 2018-08-21 (×3): 2 mg via INTRAVENOUS
  Filled 2018-08-21 (×3): qty 1

## 2018-08-21 MED ORDER — OMEGA-3-ACID ETHYL ESTERS 1 G PO CAPS
1.0000 g | ORAL_CAPSULE | Freq: Every day | ORAL | Status: DC
  Administered 2018-08-21 – 2018-08-22 (×2): 1 g via ORAL
  Filled 2018-08-21 (×2): qty 1

## 2018-08-21 MED ORDER — ENOXAPARIN SODIUM 40 MG/0.4ML ~~LOC~~ SOLN
40.0000 mg | SUBCUTANEOUS | Status: DC
  Administered 2018-08-21 – 2018-08-22 (×2): 40 mg via SUBCUTANEOUS
  Filled 2018-08-21 (×2): qty 0.4

## 2018-08-21 MED ORDER — PRAVASTATIN SODIUM 40 MG PO TABS: 40.0000 mg | ORAL_TABLET | Freq: Every day | ORAL | Status: DC

## 2018-08-21 MED ORDER — NITROGLYCERIN 0.4 MG SL SUBL
0.8000 mg | SUBLINGUAL_TABLET | Freq: Once | SUBLINGUAL | Status: AC
  Administered 2018-08-21: 0.8 mg via SUBLINGUAL

## 2018-08-21 MED ORDER — METOCLOPRAMIDE HCL 5 MG/ML IJ SOLN: 10.0000 mg | Freq: Four times a day (QID) | INTRAMUSCULAR | Status: DC | PRN

## 2018-08-21 MED ORDER — MORPHINE SULFATE (PF) 2 MG/ML IV SOLN
2.0000 mg | Freq: Once | INTRAVENOUS | Status: AC
  Administered 2018-08-21: 2 mg via INTRAVENOUS
  Filled 2018-08-21: qty 1

## 2018-08-21 MED ORDER — FERROUS SULFATE 325 (65 FE) MG PO TABS
325.0000 mg | ORAL_TABLET | Freq: Every day | ORAL | Status: DC
  Administered 2018-08-21 – 2018-08-22 (×2): 325 mg via ORAL
  Filled 2018-08-21 (×2): qty 1

## 2018-08-21 MED ORDER — FLUTICASONE PROPIONATE 50 MCG/ACT NA SUSP: 2.0000 | Freq: Every day | NASAL | Status: DC | PRN

## 2018-08-21 MED ORDER — ACETAMINOPHEN 325 MG PO TABS
650.0000 mg | ORAL_TABLET | ORAL | Status: DC | PRN
  Administered 2018-08-21 – 2018-08-22 (×3): 650 mg via ORAL
  Filled 2018-08-21 (×3): qty 2

## 2018-08-21 MED ORDER — ASPIRIN 81 MG PO CHEW
324.0000 mg | CHEWABLE_TABLET | Freq: Once | ORAL | Status: AC
  Administered 2018-08-21: 324 mg via ORAL
  Filled 2018-08-21: qty 4

## 2018-08-21 MED ORDER — LEVOTHYROXINE SODIUM 75 MCG PO TABS
75.0000 ug | ORAL_TABLET | ORAL | Status: DC
  Filled 2018-08-21: qty 1

## 2018-08-21 MED ORDER — DIPHENHYDRAMINE-APAP (SLEEP) 25-500 MG PO TABS: 1.0000 | ORAL_TABLET | Freq: Every day | ORAL | Status: DC

## 2018-08-21 MED ORDER — GABAPENTIN 100 MG PO CAPS
100.0000 mg | ORAL_CAPSULE | Freq: Two times a day (BID) | ORAL | Status: DC
  Administered 2018-08-21 – 2018-08-22 (×3): 100 mg via ORAL
  Filled 2018-08-21 (×3): qty 1

## 2018-08-21 MED ORDER — ONDANSETRON HCL 4 MG/2ML IJ SOLN
4.0000 mg | Freq: Four times a day (QID) | INTRAMUSCULAR | Status: DC | PRN
  Administered 2018-08-21: 4 mg via INTRAVENOUS
  Filled 2018-08-21: qty 2

## 2018-08-21 MED ORDER — AMLODIPINE BESYLATE 5 MG PO TABS
5.0000 mg | ORAL_TABLET | Freq: Every day | ORAL | Status: DC
  Administered 2018-08-21 – 2018-08-22 (×2): 5 mg via ORAL
  Filled 2018-08-21 (×2): qty 1

## 2018-08-21 MED ORDER — ALUM & MAG HYDROXIDE-SIMETH 200-200-20 MG/5ML PO SUSP: 30.0000 mL | Freq: Four times a day (QID) | ORAL | Status: DC | PRN

## 2018-08-21 MED ORDER — LEVOTHYROXINE SODIUM 75 MCG PO TABS
37.5000 ug | ORAL_TABLET | ORAL | Status: DC
  Administered 2018-08-22: 37.5 ug via ORAL
  Filled 2018-08-21: qty 1

## 2018-08-21 MED ORDER — VITAMIN B-12 1000 MCG PO TABS
1000.0000 ug | ORAL_TABLET | Freq: Every day | ORAL | Status: DC
  Administered 2018-08-21 – 2018-08-22 (×2): 1000 ug via ORAL
  Filled 2018-08-21 (×2): qty 1

## 2018-08-21 MED ORDER — KETOROLAC TROMETHAMINE 15 MG/ML IJ SOLN: 15.0000 mg | Freq: Four times a day (QID) | INTRAMUSCULAR | Status: DC | PRN

## 2018-08-21 MED ORDER — HYDRALAZINE HCL 20 MG/ML IJ SOLN: 5.0000 mg | INTRAMUSCULAR | Status: DC | PRN

## 2018-08-21 MED ORDER — NITROGLYCERIN 0.4 MG SL SUBL: 0.4000 mg | SUBLINGUAL_TABLET | SUBLINGUAL | Status: DC | PRN

## 2018-08-21 MED ORDER — MELATONIN 3 MG PO TABS
9.0000 mg | ORAL_TABLET | Freq: Every day | ORAL | Status: DC
  Filled 2018-08-21: qty 3

## 2018-08-21 MED ORDER — IBUPROFEN 200 MG PO TABS
400.0000 mg | ORAL_TABLET | Freq: Four times a day (QID) | ORAL | Status: DC | PRN
  Administered 2018-08-21: 400 mg via ORAL
  Filled 2018-08-21: qty 2

## 2018-08-21 MED ORDER — SODIUM CHLORIDE 0.9 % IV SOLN
INTRAVENOUS | Status: DC
  Administered 2018-08-21 – 2018-08-22 (×3): via INTRAVENOUS

## 2018-08-21 MED ORDER — ONDANSETRON HCL 4 MG/2ML IJ SOLN
4.0000 mg | Freq: Once | INTRAMUSCULAR | Status: AC
  Administered 2018-08-21: 4 mg via INTRAVENOUS
  Filled 2018-08-21: qty 2

## 2018-08-21 MED ORDER — DIPHENHYDRAMINE HCL 25 MG PO CAPS
25.0000 mg | ORAL_CAPSULE | Freq: Every day | ORAL | Status: DC
  Administered 2018-08-21: 25 mg via ORAL
  Filled 2018-08-21: qty 1

## 2018-08-21 MED ORDER — ACETAMINOPHEN 500 MG PO TABS
500.0000 mg | ORAL_TABLET | Freq: Every day | ORAL | Status: DC
  Administered 2018-08-21: 500 mg via ORAL
  Filled 2018-08-21: qty 1

## 2018-08-21 MED ORDER — ROPINIROLE HCL 0.5 MG PO TABS
0.5000 mg | ORAL_TABLET | Freq: Every day | ORAL | Status: DC
  Administered 2018-08-21: 0.5 mg via ORAL
  Filled 2018-08-21: qty 1

## 2018-08-21 NOTE — ED Notes (Signed)
  Attempted to call report.  RN will call me back. 

## 2018-08-21 NOTE — Consult Note (Addendum)
Cardiology Consultation:   Patient ID: Tracy Paul MRN: [id number]; DOB: June 23, 1951  Admit date: 08/20/2018 Date of Consult: 08/21/2018  Primary Care Provider: [name], MD Primary Cardiologist: Minus Breeding, MD  Primary Electrophysiologist:  None    Patient Profile:   Tracy Paul is a 67 y.o. female with a hx of bradycardia, HTN, normal cardiac cath ~18 years ago, low risk stress test in 2012, HLD, prediabetes, family h/o premature CAD, remote tobacco use hypothyroidism, Iron deficiency anemia and gastric ulcer who is being seen today for the evaluation of chest pain at the request of Dr. Starla Link, Internal Medicine.  History of Present Illness:   Tracy Paul has been followed by Dr. Percival Spanish. Her last OV with him was 07/2015. Per prior cardiology notes, she had a couple of distant caths with the last one about 18 years ago and apparently normal. She did have a stress perfusion studie last in 2012. This demonstrated no evidence of ischemia or infarct. She was referred back to Dr. Percival Spanish in 2016 for evaluation for bradycardia noted on EKG with rates in the 40s. She had had a monitor the year prior which showed bradycardic episodes and occasional ventricular ectopy but no significant dysrhythmias. She had no significant symptoms with her bradycardia and it was noted that she had chronotropic competence when she walked around the office . Given this, there was no indication for PPM at the time. Avoidance of AV nodal blocking agents recommended. She was advised to continue f/u with PCP for management of HTN (treated w/ Lisinopril). Dr. Percival Spanish stated that she could f/u with cardiology on a PRN basis.   She presented to the [facility] ED overnight with CC of chest pain and SOB. Sudden onset at approximately 8:30 PM. She had consumed a meal that consisted of a burger and baked potato ~1 hr prior to onset. CP described as a hard intense pain. Substernal/ mid epigastric and radiating to her  back. This later turned into sharp stabbing pain. She was diaphoretic and SOB. She notes her HR jumped up into the 90s with is unusual for her given her bradycardia. Pain was 10/10 at its worse. Did not seem to worsen with exertion. Non pleuritic. She tried taking antacids w/o relief. Came to the ED. No improvement with SL NTG. Pain improved with morphine. Currently with 3/10 sharp pain. No resting dyspnea.  CT angiogram of chest/abdomen/pelvis is negative for aortic dissection, but showed evidence suggestive of elevated right heart pressure. Scattered coronary artery calcifications were noted. POC troponin negative x 1 and actual troponin I negative x 1. CXR negative for active cardiopulmonary disease. CBC and BMP unremarkable. No BMP ordered. EKG shows sinus bradycardia 49 bpm.   Pt reports a family h/o premature CAD. Her father had multiple heart attacks starting in his early 44s. Her sister also has CAD and has required stents in there mid-late 50s. Pt is a former smoker but quit 38 years ago.   Past Medical History:  Diagnosis Date  . Allergic rhinitis   . Arthritis   . Cholecystitis, chronic   . Dysrhythmia    Hx bradycardia - hx normal cardiac cath x2 / ECHO 2009 EF 60% LAE MYOVIEW 3/12 EF 60% NO ISCHEMIA  . Hyperlipidemia   . Hypertension    no treatment after 120 lb weight loss  . Hypothyroidism   . Prediabetes   . Thyroid nodule     Past Surgical History:  Procedure Laterality Date  . Bilateral carpal tunnel surgery    .  BREATH TEK H PYLORI  07/01/2012   Procedure: BREATH TEK H PYLORI;  Surgeon: Edward Jolly, MD;  Location: [facility];  Service: General;  Laterality: N/A;  . CHOLECYSTECTOMY N/A 11/27/2013   Procedure: LAPAROSCOPIC CHOLECYSTECTOMY WITH INTRAOPERATIVE CHOLANGIOGRAM;  Surgeon: Pedro Earls, MD;  Location: [facility];  Service: General;  Laterality: N/A;  . GASTRIC ROUX-EN-Y N/A 12/16/2012   Procedure: LAPAROSCOPIC ROUX-EN-Y GASTRIC BYPASS WITH UPPER  ENDOSCOPY;  Surgeon: Pedro Earls, MD;  Location: [facility];  Service: General;  Laterality: N/A;  Gastric Bypass  . NASAL SINUS SURGERY    . REPLACEMENT TOTAL KNEE BILATERAL     bilat  . TUBAL LIGATION       Home Medications:  Prior to Admission medications   Medication Sig Start Date End Date Taking? Authorizing Provider  acyclovir (ZOVIRAX) 400 MG tablet Take 1 tablet (400 mg total) by mouth daily. Patient taking differently: Take 400 mg by mouth daily as needed (outbreak).  04/04/16  Yes [name], MD  Ascorbic Acid (VITAMIN C) 500 MG CAPS Take 1 tablet by mouth daily.    Yes [provider]  aspirin 81 MG tablet Take 81 mg by mouth daily.   Yes [provider]  Biotin 10 MG TABS Take 10,000 each by mouth daily.   Yes [provider]  Calcium Citrate-Vitamin D (CALCIUM CITRATE + D3 PO) Take 600 mg by mouth daily.    Yes [provider]  cyclobenzaprine (FLEXERIL) 10 MG tablet Take 1 tablet (10 mg total) by mouth 3 (three) times daily as needed for muscle spasms. 01/14/17  Yes Rolene Course, PA-C  diphenhydramine-acetaminophen (TYLENOL PM) 25-500 MG TABS tablet Take 1 tablet by mouth at bedtime.    Yes [provider]  Ferrous Sulfate (SLOW RELEASE IRON PO) Take 1 tablet by mouth daily.    Yes [provider]  fish oil-omega-3 fatty acids 1000 MG capsule Take 1 g by mouth daily.   Yes [provider]  fluticasone (FLONASE) 50 MCG/ACT nasal spray Place 2 sprays into both nostrils daily. Patient taking differently: Place 2 sprays into both nostrils daily as needed for allergies.  01/24/17  Yes Rolene Course, PA-C  gabapentin (NEURONTIN) 100 MG capsule Take 1 capsule 4 x / day for Neuropathy Ppain Patient taking differently: Take 100 mg by mouth 2 (two) times daily.  03/05/18  Yes [name], MD  levothyroxine (SYNTHROID, LEVOTHROID) 75 MCG tablet TAKE ONE TABLET BY MOUTH ONCE DAILY Patient taking differently: Take  37.5-75 mcg by mouth See admin instructions. Take 0.5 tablet everyday except Monday and Thursday take 1 tablet. 12/02/17  Yes [name], MD  Magnesium 250 MG TABS Take 1 tablet by mouth daily.    Yes [provider]  Melatonin 10 MG TABS Take 1 tablet by mouth at bedtime.   Yes [provider]  meloxicam (MOBIC) 15 MG tablet Take 1 tablet (15 mg total) by mouth daily. Patient taking differently: Take 15 mg by mouth daily as needed for pain.  01/14/17  Yes Rolene Course, PA-C  omeprazole (PRILOSEC) 20 MG capsule Take 1 capsule (20 mg total) by mouth daily. Patient taking differently: Take 20 mg by mouth daily as needed (acid reflux).  02/27/18  Yes Nandigam, Venia Minks, MD  potassium chloride (K-DUR) 10 MEQ tablet TAKE 1 TABLET BY MOUTH TWICE DAILY Patient taking differently: Take 10 mEq by mouth daily.  03/28/18  Yes [name], MD  rOPINIRole (REQUIP) 0.5 MG tablet TAKE ONE-HALF (  1/2) TABLET AT NIGHT. CAN INCREASE TO TAKE ONE TABLET, THEN MAY INCREASE UP TO 4 TABLETS AT NIGHT AS DIRECTED. Patient taking differently: Take 0.5 mg by mouth at bedtime.  02/05/18  Yes Vicie Mutters, PA-C  vitamin B-12 (CYANOCOBALAMIN) 1000 MCG tablet Take 1,000 mcg by mouth daily.    Yes [provider]  Vitamin D, Ergocalciferol, (DRISDOL) 50000 units CAPS capsule TAKE 1 CAPSULE BY MOUTH ONCE DAILY Patient taking differently: Take 50,000 Units by mouth every other day.  05/18/18  Yes [name], MD  ciprofloxacin (CIPRO) 250 MG tablet Take 1 tablet 2 x / day with food for Infection Patient not taking: Reported on 08/21/2018 08/01/18   [name], MD  pravastatin (PRAVACHOL) 40 MG tablet TAKE ONE TABLET BY MOUTH ONCE DAILY Patient not taking: Reported on 08/21/2018 12/19/15   [name], MD  predniSONE (DELTASONE) 20 MG tablet 2 tablets daily for 3 days, 1 tablet daily for 4 days. Patient not taking: Reported on 08/21/2018 08/08/18   Vicie Mutters, PA-C  sucralfate  (CARAFATE) 1 g tablet Take 1 tablet (1 g total) by mouth 3 (three) times daily as needed. Patient not taking: Reported on 08/21/2018 10/11/17 10/11/18  [name], NP    Inpatient Medications: Scheduled Meds: . diphenhydrAMINE  25 mg Oral QHS   And  . acetaminophen  500 mg Oral QHS  . amLODipine  5 mg Oral Daily  . aspirin  81 mg Oral Daily  . enoxaparin (LOVENOX) injection  40 mg Subcutaneous Q24H  . ferrous sulfate  325 mg Oral Daily  . gabapentin  100 mg Oral BID  . [START ON 08/22/2018] levothyroxine  37.5 mcg Oral Once per day on Sun Tue Wed Fri Sat  . levothyroxine  75 mcg Oral Once per day on Mon Thu  . Melatonin  9 mg Oral QHS  . omega-3 acid ethyl esters  1 g Oral Daily  . pantoprazole  40 mg Oral Daily  . pravastatin  40 mg Oral q1800  . rOPINIRole  0.5 mg Oral QHS  . vitamin B-12  1,000 mcg Oral Daily   Continuous Infusions: . sodium chloride 75 mL/hr at 08/21/18 0651   PRN Meds: acetaminophen, cyclobenzaprine, fluticasone, hydrALAZINE, morphine injection, nitroGLYCERIN, ondansetron (ZOFRAN) IV, zolpidem  Allergies:    Allergies  Allergen Reactions  . Codeine Other (See Comments)    "puts me out of it".  Dysphoria.  Patient tolerates Tramadol without complications.  . Nabumetone     GI upset    Social History:   Social History   Socioeconomic History  . Marital status: Married    Spouse name: Herbie Baltimore  . Number of children: 2  . Years of education: Not on file  . Highest education level: Not on file  Occupational History  . Occupation: Surveyor, quantity: [employer]  . Financial resource strain: Not on file  . Food insecurity:    Worry: Not on file    Inability: Not on file  . Transportation needs:    Medical: Not on file    Non-medical: Not on file  Tobacco Use  . Smoking status: Former Smoker    Packs/day: 0.80    Years: 10.00    Pack years: 8.00    Types: Cigarettes    Last attempt to quit: 10/30/1979    Years since  quitting: 38.8  . Smokeless tobacco: Never Used  Substance and Sexual Activity  . Alcohol use: No  . Drug use:  No  . Sexual activity: Not on file  Lifestyle  . Physical activity:    Days per week: Not on file    Minutes per session: Not on file  . Stress: Not on file  Relationships  . Social connections:    Talks on phone: Not on file    Gets together: Not on file    Attends religious service: Not on file    Active member of club or organization: Not on file    Attends meetings of clubs or organizations: Not on file    Relationship status: Not on file  . Intimate partner violence:    Fear of current or ex partner: Not on file    Emotionally abused: Not on file    Physically abused: Not on file    Forced sexual activity: Not on file  Other Topics Concern  . Not on file  Social History Narrative  . Not on file    Family History:    Family History  Problem Relation Age of Onset  . Breast cancer Mother   . Cancer Mother        breast cancer  . Anuerysm Mother        brain anuerysm  . Heart attack Father 63  . Heart attack Sister 52  . Colon cancer Neg Hx   . Stomach cancer Neg Hx   . Rectal cancer Neg Hx   . Esophageal cancer Neg Hx      ROS:  Please see the history of present illness.   All other ROS reviewed and negative.     Physical Exam/Data:   Vitals:   08/21/18 0401 08/21/18 0430 08/21/18 0450 08/21/18 0738  BP: (!) 147/91 129/89 (!) 148/75 (!) 141/66  Pulse: (!) 58 (!) 54 (!) 56 (!) 46  Resp: (!) 23 (!) 23    Temp:   97.7 F (36.5 C) 97.7 F (36.5 C)  TempSrc:   Oral Oral  SpO2: 95% 96% 100% 93%  Weight:   81.4 kg   Height:   5\' 3"  (1.6 m)     Intake/Output Summary (Last 24 hours) at 08/21/2018 0841 Last data filed at 08/21/2018 0651 Gross per 24 hour  Intake 117.76 ml  Output -  Net 117.76 ml   Filed Weights   08/21/18 0450  Weight: 81.4 kg   Body mass index is 31.8 kg/m.  General:  Well nourished, well developed, in no acute  distress HEENT: normal Lymph: no adenopathy Neck: no JVD Endocrine:  No thryomegaly Vascular: No carotid bruits; FA pulses 2+ bilaterally without bruits  Cardiac:  normal S1, S2; RRR; no murmur  Lungs:  clear to auscultation bilaterally, no wheezing, rhonchi or rales  Abd: soft, nontender, no hepatomegaly  Ext: no edema Musculoskeletal:  No deformities, BUE and BLE strength normal and equal Skin: warm and dry  Neuro:  CNs 2-12 intact, no focal abnormalities noted Psych:  Normal affect   EKG:  The EKG was personally reviewed and demonstrates:  Sinus bradycardia 49 bpm  Telemetry:  Telemetry was personally reviewed and demonstrates:  Sinus brady 50s  Relevant CV Studies: None  Echo pending.   Laboratory Data:  Chemistry Recent Labs  Lab 08/20/18 2138  NA 139  K 4.4  CL 109  CO2 25  GLUCOSE 93  BUN 11  CREATININE 0.96  CALCIUM 8.9  GFRNONAA >60  GFRAA >60  ANIONGAP 5    No results for input(s): PROT, ALBUMIN, AST, ALT, ALKPHOS, BILITOT in the  last 168 hours. Hematology Recent Labs  Lab 08/20/18 2138  WBC 10.5  RBC 4.06  HGB 11.9*  HCT 39.5  MCV 97.3  MCH 29.3  MCHC 30.1  RDW 13.4  PLT 290   Cardiac Enzymes Recent Labs  Lab 08/21/18 0519  TROPONINI <0.03    Recent Labs  Lab 08/20/18 2155  TROPIPOC 0.01    BNPNo results for input(s): BNP, PROBNP in the last 168 hours.  DDimer No results for input(s): DDIMER in the last 168 hours.  Radiology/Studies:  Dg Chest 2 View  Result Date: 08/20/2018 CLINICAL DATA:  Chest pain EXAM: CHEST - 2 VIEW COMPARISON:  12/20/2015 FINDINGS: The heart size and mediastinal contours are within normal limits. Both lungs are clear. The visualized skeletal structures are unremarkable. IMPRESSION: No active cardiopulmonary disease. Electronically Signed   By: Donavan Foil M.D.   On: 08/20/2018 21:56   Ct Angio Chest/abd/pel For Dissection W And/or W/wo  Result Date: 08/21/2018 CLINICAL DATA:  Chest pain radiating to the  back. Clinical concern for dissection EXAM: CT ANGIOGRAPHY CHEST, ABDOMEN AND PELVIS TECHNIQUE: Multidetector CT imaging through the chest, abdomen and pelvis was performed using the standard protocol during bolus administration of intravenous contrast. Multiplanar reconstructed images and MIPs were obtained and reviewed to evaluate the vascular anatomy. CONTRAST:  179mL ISOVUE-370 IOPAMIDOL (ISOVUE-370) INJECTION 76% COMPARISON:  Abdominal CT 08/12/2018.  Chest radiographs yesterday. FINDINGS: CTA CHEST FINDINGS Cardiovascular: Normal caliber thoracic aorta with atherosclerosis. No dissection, aneurysm, aortic hematoma or acute aortic syndrome. Common origin of the brachiocephalic and left common carotid from the aortic arch, normal variant anatomy. No filling defects in the central pulmonary arteries to suggest pulmonary embolus. Borderline cardiomegaly. Contrast refluxes into the hepatic veins and IVC. No pericardial effusion. There are scattered coronary artery calcifications. Mediastinum/Nodes: No enlarged mediastinal, hilar, or axillary lymph nodes. The esophagus is decompressed. Hypodense right thyroid nodule measures 2.3 cm in unchanged from 2014 CT. Lungs/Pleura: Minor scattered atelectasis. No confluent consolidation. No pulmonary edema or pleural fluid. No pulmonary mass. Musculoskeletal: Scoliosis and degenerative change in the spine. There are no acute or suspicious osseous abnormalities. Review of the MIP images confirms the above findings. CTA ABDOMEN AND PELVIS FINDINGS VASCULAR Aorta: Moderate atherosclerosis without aneurysm, dissection, significant stenosis or vasculitis. Celiac: Patent without dissection or significant stenosis. SMA: Patent without dissection or significant stenosis. Renals: 2 left and single right renal arteries. All renal arteries are patent. No stenosis or dissection. IMA: Patent. Inflow: Patent with mild atherosclerosis. No dissection or stenosis. Veins: No obvious venous  abnormality within the limitations of this arterial phase study. Review of the MIP images confirms the above findings. NON-VASCULAR Hepatobiliary: A hypodense lesion at the right hepatic dome, unchanged from prior. No new hepatic abnormality. Clips in the gallbladder fossa postcholecystectomy. No biliary dilatation. Pancreas: No ductal dilatation or inflammation. Spleen: Normal in size and arterial phase enhancement. Adrenals/Urinary Tract: Normal adrenal glands. No hydronephrosis or perinephric edema. Homogeneous renal enhancement. Urinary bladder is physiologically distended without wall thickening. Stomach/Bowel: Post gastric bypass. Detailed bowel evaluation better assessed on prior contrast-enhanced exam. No obstruction, wall thickening, or inflammation. There is colonic tortuosity. Mild colonic diverticulosis without diverticulitis. Normal appendix. Lymphatic: No adenopathy. Reproductive: Uterus and bilateral adnexa are unremarkable. Other: No free air or free fluid. Musculoskeletal: Scoliosis and degenerative change in the spine. There are no acute or suspicious osseous abnormalities. Review of the MIP images confirms the above findings. IMPRESSION: 1. Thoracoabdominal aortic atherosclerosis without dissection or acute aortic abnormality. 2. Mild  contrast refluxing into the hepatic veins and IVC suggest elevated right heart pressures. Borderline cardiomegaly. 3. Unchanged 2.3 cm right thyroid nodule dating back 2014. Consider elective thyroid ultrasound for characterization on an outpatient basis. 4. 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 Colonic diverticulosis without diverticulitis. Aortic Atherosclerosis (ICD10-I70.0). Electronically Signed   By: Keith Rake M.D.   On: 08/21/2018 04:41    Assessment and Plan:   DARE SPILLMAN is a 67 y.o. female with a hx of bradycardia, HTN, normal cardiac cath ~18 years ago, low risk stress test in 2012, HLD, prediabetes, family h/o premature CAD,  remote tobacco use, hypothyroidism, Iron deficiency anemia and gastric ulcer who is being seen today for the evaluation of chest pain at the request of Dr. Starla Link, Internal Medicine.  1. Chest Pain: CT angiogram of chest/abdomen/pelvis is negative for aortic dissection, but showed evidence suggestive of elevated right heart pressure. Given her SOB and elevated right heart pressures, recommend checking D-dimer to r/o PE. Also consider cardiac etiology given scatter coronary calcifications on chest CT. She has had 2 negative troponins. EKG shows sinus bradycardia at 49 bmp. She would be a good candidate for coronary CTA to define her coronary anatomy. Agree with getting 2D echo. CXR negative. If negative cardiac w/u then consider GI etiology given her h/o gastric ulcer and post prandial epigastric pain. Lipase is negative.   2. Sinus Bradycardia: long history of this. Evaluated by Dr. Percival Spanish. Outpatient monitor in 2015 showed bradycardic episodes and occasional ventricular ectopy but no significant dysrhythmias. She had no significant symptoms with her bradycardia then and it was noted that she had chronotropic competence when she walked around the office . Given this, there was no indication for PPM at the time. EKG today shows sinus bradycardia with rate of 49 bpm. She denies syncope/ near syncope. Continue to avoid AV nodal blocking agents.   MD to follow with further recommendations.    For questions or updates, please contact [facility] Please consult [email or web address] for contact info under     Signed, Lyda Jester, PA-C  08/21/2018 8:41 AM  Personally seen and examined. Agree with above.  67 year old with chest discomfort, long-standing history of bradycardia, family history of premature CAD. Currently she is feeling better with only mild epigastric discomfort.  No shortness of breath.  GEN: Well nourished, well developed, in no acute distress  HEENT: normal  Neck: no JVD, carotid  bruits, or masses Cardiac: RRR; no murmurs, rubs, or gallops,no edema  Respiratory:  clear to auscultation bilaterally, normal work of breathing GI: soft, nontender, nondistended, + BS MS: no deformity or atrophy  Skin: warm and dry, no rash Neuro:  Alert and Oriented x 3, Strength and sensation are intact Psych: euthymic mood, full affect  CT scan of chest abdomen pelvis  - No evidence of aortopathy/aortic dissection.  There is no evidence of PE in main branch segments. -There is coronary artery calcification as well as aortic atherosclerosis present. -Possible evidence of elevated right-sided heart pressures.  Lab work-troponins are normal.  Chest pain/coronary artery calcification -Differential includes GI, possibly cardiac/musculoskeletal.  Given her bradycardia I think it makes sense for [place] to proceed with coronary CTA to exclude any evidence of high risk CAD.  She does have coronary artery calcifications noted.  Prior heart catheterization several years ago was normal.  Aortic atherosclerosis -Talk to her about this as well as her coronary artery calcification and she is not currently on statin therapy, at least she did not recognize.  Looks like pravastatin was on her home medicine list.  We will go ahead and start atorvastatin 40 mg, high intensity statin to help with stabilization.  Sinus bradycardia -No indication for pacemaker.  This is long-standing.  No syncope.  Avoid beta-blockers.  Candee Furbish, MD

## 2018-08-21 NOTE — Progress Notes (Signed)
Patient ID: Tracy Paul, female   DOB: Apr 03, 1951, 67 y.o.   MRN: [id number] Patient was admitted early this morning for chest pain or shortness of breath.  Initial troponin was negative.  Cardiology has been consulted.  Patient seen and examined at bedside and plan of care discussed with her.  This morning's H&P and prior medical records was reviewed by myself.  Current labs and medications were reviewed by myself.  Await further cardiology recommendations.  Repeat a.m. labs.  Continue PPI.  We will add GI cocktail.

## 2018-08-21 NOTE — Progress Notes (Signed)
  Echocardiogram 2D Echocardiogram has been performed.  Tracy Paul 08/21/2018, 12:18 PM

## 2018-08-21 NOTE — ED Provider Notes (Signed)
[place] EMERGENCY DEPARTMENT Provider Note   CSN: [id number] Arrival date & time: 08/20/18  2120     History   Chief Complaint Chief Complaint  Patient presents with  . Chest Pain    HPI Tracy Paul is a 67 y.o. female.  Patient presents to the emergency department for evaluation of chest pain.  Patient reports that she had onset of severe central chest pain around 8:30 PM.  It felt like a pressure in the center of her chest.  She became slightly diaphoretic and nauseated, felt short of breath.  She checked her vital signs, had hypertension and tachycardia.  She reports that her resting heart rate is between 30 and 50 bpm, she was nearly 100 bpm when the pain was occurring.     Past Medical History:  Diagnosis Date  . Allergic rhinitis   . Arthritis   . Cholecystitis, chronic   . Dysrhythmia    Hx bradycardia - hx normal cardiac cath x2 / ECHO 2009 EF 60% LAE MYOVIEW 3/12 EF 60% NO ISCHEMIA  . Hyperlipidemia   . Hypertension    no treatment after 120 lb weight loss  . Hypothyroidism   . Prediabetes   . Thyroid nodule     Patient Active Problem List   Diagnosis Date Noted  . Overweight (BMI 25.0-29.9) 10/30/2017  . Gastrointestinal ulcer due to Helicobacter pylori 97/11/6376  . Aortic atherosclerosis ([place]) 10/10/2017  . Irritable bowel syndrome with diarrhea 10/16/2016  . Other abnormal glucose 05/04/2015  . Medication management 09/02/2014  . Vitamin D deficiency 12/02/2013  . 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 Hyperlipidemia   . Hypothyroidism   . GERD (gastroesophageal reflux disease)   . Essential hypertension 09/26/2010  . 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 HEADACHE, CHRONIC 09/26/2010    Past Surgical History:  Procedure Laterality Date  . Bilateral carpal tunnel surgery    . BREATH TEK H PYLORI  07/01/2012   Procedure: BREATH TEK H PYLORI;  Surgeon: Edward Jolly, MD;  Location: [facility];  Service:  General;  Laterality: N/A;  . CHOLECYSTECTOMY N/A 11/27/2013   Procedure: LAPAROSCOPIC CHOLECYSTECTOMY WITH INTRAOPERATIVE CHOLANGIOGRAM;  Surgeon: Pedro Earls, MD;  Location: [facility];  Service: General;  Laterality: N/A;  . GASTRIC ROUX-EN-Y N/A 12/16/2012   Procedure: LAPAROSCOPIC ROUX-EN-Y GASTRIC BYPASS WITH UPPER ENDOSCOPY;  Surgeon: Pedro Earls, MD;  Location: [facility];  Service: General;  Laterality: N/A;  Gastric Bypass  . NASAL SINUS SURGERY    . REPLACEMENT TOTAL KNEE BILATERAL     bilat  . TUBAL LIGATION       OB History   None      Home Medications    Prior to Admission medications   Medication Sig Start Date End Date Taking? Authorizing Provider  acyclovir (ZOVIRAX) 400 MG tablet Take 1 tablet (400 mg total) by mouth daily. 04/04/16   [name], MD  Ascorbic Acid (VITAMIN C) 500 MG CAPS Take by mouth daily.    [provider]  aspirin 81 MG tablet Take 81 mg by mouth daily.    [provider]  Biotin 10 MG TABS Take 10,000 each by mouth daily.    [provider]  Calcium Citrate-Vitamin D (CALCIUM CITRATE + D3 PO) Take 600 mg by mouth daily.     [provider]  ciprofloxacin (CIPRO) 250 MG tablet Take 1 tablet 2 x / day with food for Infection 08/01/18   Melford Aase,  Gwyndolyn Saxon, MD  cyclobenzaprine (FLEXERIL) 10 MG tablet Take 1 tablet (10 mg total) by mouth 3 (three) times daily as needed for muscle spasms. 01/14/17   Rolene Course, PA-C  diphenhydramine-acetaminophen (TYLENOL PM) 25-500 MG TABS tablet Take 1 tablet by mouth at bedtime as needed.    [provider]  Ferrous Sulfate (SLOW RELEASE IRON PO) Take by mouth daily.    [provider]  fish oil-omega-3 fatty acids 1000 MG capsule Take 1 g by mouth daily.    [provider]  fluticasone (FLONASE) 50 MCG/ACT nasal spray Place 2 sprays into both nostrils daily. 01/24/17   Rolene Course, PA-C  gabapentin (NEURONTIN) 100 MG capsule Take 1 capsule 4 x  / day for Neuropathy Ppain 03/05/18   [name], MD  levothyroxine (SYNTHROID, LEVOTHROID) 75 MCG tablet TAKE ONE TABLET BY MOUTH ONCE DAILY 12/02/17   [name], MD  Magnesium 250 MG TABS Take by mouth daily.    [provider]  Melatonin 10 MG TABS Take 1 tablet by mouth at bedtime.    [provider]  meloxicam (MOBIC) 15 MG tablet Take 1 tablet (15 mg total) by mouth daily. 01/14/17   Rolene Course, PA-C  omeprazole (PRILOSEC) 20 MG capsule Take 1 capsule (20 mg total) by mouth daily. 02/27/18   Mauri Pole, MD  OVER THE COUNTER MEDICATION Takes Allegra 25 mg at bedtime.    [provider]  potassium chloride (K-DUR) 10 MEQ tablet TAKE 1 TABLET BY MOUTH TWICE DAILY 03/28/18   [name], MD  pravastatin (PRAVACHOL) 40 MG tablet TAKE ONE TABLET BY MOUTH ONCE DAILY 12/19/15   [name], MD  predniSONE (DELTASONE) 20 MG tablet 2 tablets daily for 3 days, 1 tablet daily for 4 days. 08/08/18   Vicie Mutters, PA-C  rOPINIRole (REQUIP) 0.5 MG tablet TAKE ONE-HALF (1/2) TABLET AT NIGHT. CAN INCREASE TO TAKE ONE TABLET, THEN MAY INCREASE UP TO 4 TABLETS AT NIGHT AS DIRECTED. 02/05/18   Vicie Mutters, PA-C  sucralfate (CARAFATE) 1 g tablet Take 1 tablet (1 g total) by mouth 3 (three) times daily as needed. 10/11/17 10/11/18  [name], NP  vitamin B-12 (CYANOCOBALAMIN) 1000 MCG tablet Take 1,000 mcg by mouth daily.     [provider]  Vitamin D, Ergocalciferol, (DRISDOL) 50000 units CAPS capsule TAKE 1 CAPSULE BY MOUTH ONCE DAILY 05/18/18   [name], MD    Family History Family History  Problem Relation Age of Onset  . Breast cancer Mother   . Cancer Mother        breast cancer  . Anuerysm Mother        brain anuerysm  . Heart attack Father 45  . Heart attack Sister 45  . Colon cancer Neg Hx   . Stomach cancer Neg Hx   . Rectal cancer Neg Hx   . Esophageal cancer Neg Hx     Social History Social History    Tobacco Use  . Smoking status: Former Smoker    Packs/day: 0.80    Years: 10.00    Pack years: 8.00    Types: Cigarettes    Last attempt to quit: 10/30/1979    Years since quitting: 38.8  . Smokeless tobacco: Never Used  Substance Use Topics  . Alcohol use: No  . Drug use: No     Allergies   Codeine and Nabumetone   Review of Systems Review of Systems  Constitutional: Positive for diaphoresis.  Respiratory: Positive for shortness  of breath.   Cardiovascular: Positive for chest pain.  Gastrointestinal: Positive for nausea.  All other systems reviewed and are negative.    Physical Exam Updated Vital Signs BP (!) 164/73   Pulse (!) 49   Temp 98.3 F (36.8 C) (Oral)   Resp (!) 21   LMP  (LMP Unknown)   SpO2 98%   Physical Exam  Constitutional: She is oriented to person, place, and time. She appears well-developed and well-nourished. No distress.  HENT:  Head: Normocephalic and atraumatic.  Right Ear: Hearing normal.  Left Ear: Hearing normal.  Nose: Nose normal.  Mouth/Throat: Oropharynx is clear and moist and mucous membranes are normal.  Eyes: Pupils are equal, round, and reactive to light. Conjunctivae and EOM are normal.  Neck: Normal range of motion. Neck supple.  Cardiovascular: Regular rhythm, S1 normal and S2 normal. Exam reveals no gallop and no friction rub.  No murmur heard. Pulmonary/Chest: Effort normal and breath sounds normal. No respiratory distress. She exhibits no tenderness.  Abdominal: Soft. Normal appearance and bowel sounds are normal. There is no hepatosplenomegaly. There is no tenderness. There is no rebound, no guarding, no tenderness at McBurney's point and negative Murphy's sign. No hernia.  Musculoskeletal: Normal range of motion.  Neurological: She is alert and oriented to person, place, and time. She has normal strength. No cranial nerve deficit or sensory deficit. Coordination normal. GCS eye subscore is 4. GCS verbal subscore is 5.  GCS motor subscore is 6.  Skin: Skin is warm, dry and intact. No rash noted. No cyanosis.  Psychiatric: She has a normal mood and affect. Her speech is normal and behavior is normal. Thought content normal.  Nursing note and vitals reviewed.    ED Treatments / Results  Labs (all labs ordered are listed, but only abnormal results are displayed) Labs Reviewed  CBC - Abnormal; Notable for the following components:      Result Value   Hemoglobin 11.9 (*)    All other components within normal limits  BASIC METABOLIC PANEL  I-STAT TROPONIN, ED    EKG EKG Interpretation  Date/Time:  Wednesday August 20 2018 21:25:35 EDT Ventricular Rate:  69 PR Interval:  156 QRS Duration: 80 QT Interval:  416 QTC Calculation: 445 R Axis:   25 Text Interpretation:  Normal sinus rhythm with sinus arrhythmia Anterior infarct , age undetermined Abnormal ECG Confirmed by Orpah Greek [phone]) on 08/21/2018 12:33:44 AM   Radiology Dg Chest 2 View  Result Date: 08/20/2018 CLINICAL DATA:  Chest pain EXAM: CHEST - 2 VIEW COMPARISON:  12/20/2015 FINDINGS: The heart size and mediastinal contours are within normal limits. Both lungs are clear. The visualized skeletal structures are unremarkable. IMPRESSION: No active cardiopulmonary disease. Electronically Signed   By: Donavan Foil M.D.   On: 08/20/2018 21:56    Procedures Procedures (including critical care time)  Medications Ordered in ED Medications  morphine 2 MG/ML injection 2 mg (has no administration in time range)  ondansetron (ZOFRAN) injection 4 mg (has no administration in time range)  aspirin chewable tablet 324 mg (324 mg Oral Given 08/21/18 0056)  nitroGLYCERIN (NITROSTAT) SL tablet 0.4 mg (0.4 mg Sublingual Given 08/21/18 0056)     Initial Impression / Assessment and Plan / ED Course  I have reviewed the triage vital signs and the nursing notes.  Pertinent labs & imaging results that were available during my care of the  patient were reviewed by me and considered in my medical decision making (see  chart for details).     Patient presents for evaluation of chest pain.  Patient has centralized chest pain that was onset earlier tonight while at rest.  She had diaphoresis, nausea and shortness of breath associated with the symptoms.  Symptoms have slowly improved but have not completely resolved.  She does not have any known heart disease, but has multiple cardiac risk factors, heart score is 4.  EKG does not show ischemia or infarct.  Troponin is negative.  Based on her risk stratification, requires hospitalization for further management.  She does have follow-up with cardiology in 5 days because of her long-standing history of bradycardia.  Final Clinical Impressions(s) / ED Diagnoses   Final diagnoses:  Chest pain, unspecified type    ED Discharge Orders    None       Orpah Greek, MD 08/21/18 [phone]

## 2018-08-21 NOTE — Progress Notes (Signed)
   Awaiting full CT FFR analysis. She will need to stay overnight until done. Spoke to Dr. Johnsie Cancel.  Spoke to her and family as well. They understand  Candee Furbish, MD

## 2018-08-21 NOTE — Plan of Care (Signed)
  Problem: Education: Goal: Knowledge of General Education information will improve Description Including pain rating scale, medication(s)/side effects and non-pharmacologic comfort measures Outcome: Completed/Met   Problem: Clinical Measurements: Goal: Respiratory complications will improve Outcome: Completed/Met

## 2018-08-21 NOTE — H&P (Signed)
History and Physical    Tracy Paul TDH:[id number] DOB: [birth year] DOA: 08/20/2018  Referring MD/NP/PA:   PCP: [name], MD   Patient coming from:  The patient is coming from home.  At baseline, pt is independent for most of ADL.   Chief Complaint: CP and SOB  HPI: Tracy Paul is a 67 y.o. female with medical history significant of hypertension, hyperlipidemia, prediabetes, hypothyroidism, depression, iron deficiency anemia, gastric ulcer disease, bradycardia, gastric Roux-en-Y bypass, who presents with chest pain shortness of breath.  Patient states that her chest pain started at about 8:30 PM, which is located in substernal area, "14 out of 10" in severity initially, currently 6 out of 10 severity, pressure-like, radiating to the upper mid back between shoulder blades.  It is associated with shortness of breath, no cough or fever or chills.  Chest pain is not pleuritic, not aggravated by deep breath.  No recent long distance traveling, no tenderness in the calf areas.  Patient has nausea, but no vomiting, diarrhea or abdominal pain pain no symptoms of UTI or unilateral weakness.  ED Course: pt was found to have negative troponin, WBC 10.5, electrolytes renal function okay, temperature normal, heart rate 49, RR 21, oxygen saturation 98% on room air, negative chest x-ray. CT angiogram of chest/abdomen/pelvis is negative for aortic dissection, but showed evidence suggestive of elevated right heart pressure. Patient is placed on telemetry bed for observation.   Review of Systems:   General: no fevers, chills, no body weight gain, has fatigue HEENT: no blurry vision, hearing changes or sore throat Respiratory: has dyspnea, no coughing, wheezing CV: has chest pain, no palpitations GI: has nausea, no vomiting, abdominal pain, diarrhea, constipation GU: no dysuria, burning on urination, increased urinary frequency, hematuria  Ext: no leg edema Neuro: no unilateral weakness,  numbness, or tingling, no vision change or hearing loss Skin: no rash, no skin tear. MSK: No muscle spasm, no deformity, no limitation of range of movement in spin Heme: No easy bruising.  Travel history: No recent long distant travel.  Allergy:  Allergies  Allergen Reactions  . Codeine Other (See Comments)    "puts me out of it".  Dysphoria.  Patient tolerates Tramadol without complications.  . Nabumetone     GI upset    Past Medical History:  Diagnosis Date  . Allergic rhinitis   . Arthritis   . Cholecystitis, chronic   . Dysrhythmia    Hx bradycardia - hx normal cardiac cath x2 / ECHO 2009 EF 60% LAE MYOVIEW 3/12 EF 60% NO ISCHEMIA  . Hyperlipidemia   . Hypertension    no treatment after 120 lb weight loss  . Hypothyroidism   . Prediabetes   . Thyroid nodule     Past Surgical History:  Procedure Laterality Date  . Bilateral carpal tunnel surgery    . BREATH TEK H PYLORI  07/01/2012   Procedure: BREATH TEK H PYLORI;  Surgeon: Edward Jolly, MD;  Location: [facility];  Service: General;  Laterality: N/A;  . CHOLECYSTECTOMY N/A 11/27/2013   Procedure: LAPAROSCOPIC CHOLECYSTECTOMY WITH INTRAOPERATIVE CHOLANGIOGRAM;  Surgeon: Pedro Earls, MD;  Location: [facility];  Service: General;  Laterality: N/A;  . GASTRIC ROUX-EN-Y N/A 12/16/2012   Procedure: LAPAROSCOPIC ROUX-EN-Y GASTRIC BYPASS WITH UPPER ENDOSCOPY;  Surgeon: Pedro Earls, MD;  Location: [facility];  Service: General;  Laterality: N/A;  Gastric Bypass  . NASAL SINUS SURGERY    . REPLACEMENT TOTAL KNEE BILATERAL  bilat  . TUBAL LIGATION      Social History:  reports that she quit smoking about 38 years ago. Her smoking use included cigarettes. She has a 8.00 pack-year smoking history. She has never used smokeless tobacco. She reports that she does not drink alcohol or use drugs.  Family History:  Family History  Problem Relation Age of Onset  . Breast cancer Mother   . Cancer Mother        breast  cancer  . Anuerysm Mother        brain anuerysm  . Heart attack Father 45  . Heart attack Sister 10  . Colon cancer Neg Hx   . Stomach cancer Neg Hx   . Rectal cancer Neg Hx   . Esophageal cancer Neg Hx      Prior to Admission medications   Medication Sig Start Date End Date Taking? Authorizing Provider  acyclovir (ZOVIRAX) 400 MG tablet Take 1 tablet (400 mg total) by mouth daily. 04/04/16   [name], MD  Ascorbic Acid (VITAMIN C) 500 MG CAPS Take by mouth daily.    [provider]  aspirin 81 MG tablet Take 81 mg by mouth daily.    [provider]  Biotin 10 MG TABS Take 10,000 each by mouth daily.    [provider]  Calcium Citrate-Vitamin D (CALCIUM CITRATE + D3 PO) Take 600 mg by mouth daily.     [provider]  ciprofloxacin (CIPRO) 250 MG tablet Take 1 tablet 2 x / day with food for Infection 08/01/18   [name], MD  cyclobenzaprine (FLEXERIL) 10 MG tablet Take 1 tablet (10 mg total) by mouth 3 (three) times daily as needed for muscle spasms. 01/14/17   Rolene Course, PA-C  diphenhydramine-acetaminophen (TYLENOL PM) 25-500 MG TABS tablet Take 1 tablet by mouth at bedtime as needed.    [provider]  Ferrous Sulfate (SLOW RELEASE IRON PO) Take by mouth daily.    [provider]  fish oil-omega-3 fatty acids 1000 MG capsule Take 1 g by mouth daily.    [provider]  fluticasone (FLONASE) 50 MCG/ACT nasal spray Place 2 sprays into both nostrils daily. 01/24/17   Rolene Course, PA-C  gabapentin (NEURONTIN) 100 MG capsule Take 1 capsule 4 x / day for Neuropathy Ppain 03/05/18   [name], MD  levothyroxine (SYNTHROID, LEVOTHROID) 75 MCG tablet TAKE ONE TABLET BY MOUTH ONCE DAILY 12/02/17   [name], MD  Magnesium 250 MG TABS Take by mouth daily.    [provider]  Melatonin 10 MG TABS Take 1 tablet by mouth at bedtime.    [provider]  meloxicam (MOBIC) 15 MG tablet Take  1 tablet (15 mg total) by mouth daily. 01/14/17   Rolene Course, PA-C  omeprazole (PRILOSEC) 20 MG capsule Take 1 capsule (20 mg total) by mouth daily. 02/27/18   Mauri Pole, MD  OVER THE COUNTER MEDICATION Takes Allegra 25 mg at bedtime.    [provider]  potassium chloride (K-DUR) 10 MEQ tablet TAKE 1 TABLET BY MOUTH TWICE DAILY 03/28/18   [name], MD  pravastatin (PRAVACHOL) 40 MG tablet TAKE ONE TABLET BY MOUTH ONCE DAILY 12/19/15   [name], MD  predniSONE (DELTASONE) 20 MG tablet 2 tablets daily for 3 days, 1 tablet daily for 4 days. 08/08/18   Vicie Mutters, PA-C  rOPINIRole (REQUIP) 0.5 MG tablet TAKE ONE-HALF (1/2) TABLET AT NIGHT. CAN INCREASE TO TAKE ONE TABLET, THEN  MAY INCREASE UP TO 4 TABLETS AT NIGHT AS DIRECTED. 02/05/18   Vicie Mutters, PA-C  sucralfate (CARAFATE) 1 g tablet Take 1 tablet (1 g total) by mouth 3 (three) times daily as needed. 10/11/17 10/11/18  [name], NP  vitamin B-12 (CYANOCOBALAMIN) 1000 MCG tablet Take 1,000 mcg by mouth daily.     [provider]  Vitamin D, Ergocalciferol, (DRISDOL) 50000 units CAPS capsule TAKE 1 CAPSULE BY MOUTH ONCE DAILY 05/18/18   [name], MD    Physical Exam: Vitals:   08/21/18 0230 08/21/18 0401 08/21/18 0430 08/21/18 0450  BP: (!) 159/78 (!) 147/91 129/89 (!) 148/75  Pulse: (!) 56 (!) 58 (!) 54 (!) 56  Resp: 17 (!) 23 (!) 23   Temp:    97.7 F (36.5 C)  TempSrc:    Oral  SpO2: 98% 95% 96% 100%  Weight:    81.4 kg  Height:    5\' 3"  (1.6 m)   General: Not in acute distress HEENT:       Eyes: PERRL, EOMI, no scleral icterus.       ENT: No discharge from the ears and nose, no pharynx injection, no tonsillar enlargement.        Neck: No JVD, no bruit, no mass felt. Heme: No neck lymph node enlargement. Cardiac: S1/S2, RRR, No murmurs, No gallops or rubs. Respiratory: No rales, wheezing, rhonchi or rubs. GI: Soft, nondistended, nontender, no rebound pain, no  organomegaly, BS present. GU: No hematuria Ext: No pitting leg edema bilaterally. 2+DP/PT pulse bilaterally. Musculoskeletal: No joint deformities, No joint redness or warmth, no limitation of ROM in spin. Skin: No rashes.  Neuro: Alert, oriented X3, cranial nerves II-XII grossly intact, moves all extremities normally.  Psych: Patient is not psychotic, no suicidal or hemocidal ideation.  Labs on Admission: I have personally reviewed following labs and imaging studies  CBC: Recent Labs  Lab 08/20/18 2138  WBC 10.5  HGB 11.9*  HCT 39.5  MCV 97.3  PLT 161   Basic Metabolic Panel: Recent Labs  Lab 08/20/18 2138  NA 139  K 4.4  CL 109  CO2 25  GLUCOSE 93  BUN 11  CREATININE 0.96  CALCIUM 8.9   GFR: Estimated Creatinine Clearance: 58.2 mL/min (by C-G formula based on SCr of 0.96 mg/dL). Liver Function Tests: No results for input(s): AST, ALT, ALKPHOS, BILITOT, PROT, ALBUMIN in the last 168 hours. No results for input(s): LIPASE, AMYLASE in the last 168 hours. No results for input(s): AMMONIA in the last 168 hours. Coagulation Profile: No results for input(s): INR, PROTIME in the last 168 hours. Cardiac Enzymes: No results for input(s): CKTOTAL, CKMB, CKMBINDEX, TROPONINI in the last 168 hours. BNP (last 3 results) No results for input(s): PROBNP in the last 8760 hours. HbA1C: Recent Labs    08/21/18 0519  HGBA1C 5.5   CBG: No results for input(s): GLUCAP in the last 168 hours. Lipid Profile: No results for input(s): CHOL, HDL, LDLCALC, TRIG, CHOLHDL, LDLDIRECT in the last 72 hours. Thyroid Function Tests: No results for input(s): TSH, T4TOTAL, FREET4, T3FREE, THYROIDAB in the last 72 hours. Anemia Panel: No results for input(s): VITAMINB12, FOLATE, FERRITIN, TIBC, IRON, RETICCTPCT in the last 72 hours. Urine analysis:    Component Value Date/Time   COLORURINE YELLOW 07/31/2018 1322   APPEARANCEUR CLOUDY (A) 07/31/2018 1322   LABSPEC 1.011 07/31/2018 1322    PHURINE < OR = 5.0 07/31/2018 1322   GLUCOSEU NEGATIVE 07/31/2018 1322   HGBUR NEGATIVE 07/31/2018  [place] 05/23/2016 [place] 07/31/2018 1322   PROTEINUR NEGATIVE 07/31/2018 1322   UROBILINOGEN 0.2 10/03/2013 1524   NITRITE NEGATIVE 07/31/2018 1322   LEUKOCYTESUR 3+ (A) 07/31/2018 1322   Sepsis Labs: @LABRCNTIP (procalcitonin:4,lacticidven:4) )No results found for this or any previous visit (from the past 240 hour(s)).   Radiological Exams on Admission: Dg Chest 2 View  Result Date: 08/20/2018 CLINICAL DATA:  Chest pain EXAM: CHEST - 2 VIEW COMPARISON:  12/20/2015 FINDINGS: The heart size and mediastinal contours are within normal limits. Both lungs are clear. The visualized skeletal structures are unremarkable. IMPRESSION: No active cardiopulmonary disease. Electronically Signed   By: Donavan Foil M.D.   On: 08/20/2018 21:56   Ct Angio Chest/abd/pel For Dissection W And/or W/wo  Result Date: 08/21/2018 CLINICAL DATA:  Chest pain radiating to the back. Clinical concern for dissection EXAM: CT ANGIOGRAPHY CHEST, ABDOMEN AND PELVIS TECHNIQUE: Multidetector CT imaging through the chest, abdomen and pelvis was performed using the standard protocol during bolus administration of intravenous contrast. Multiplanar reconstructed images and MIPs were obtained and reviewed to evaluate the vascular anatomy. CONTRAST:  137mL ISOVUE-370 IOPAMIDOL (ISOVUE-370) INJECTION 76% COMPARISON:  Abdominal CT 08/12/2018.  Chest radiographs yesterday. FINDINGS: CTA CHEST FINDINGS Cardiovascular: Normal caliber thoracic aorta with atherosclerosis. No dissection, aneurysm, aortic hematoma or acute aortic syndrome. Common origin of the brachiocephalic and left common carotid from the aortic arch, normal variant anatomy. No filling defects in the central pulmonary arteries to suggest pulmonary embolus. Borderline cardiomegaly. Contrast refluxes into the hepatic veins and IVC. No  pericardial effusion. There are scattered coronary artery calcifications. Mediastinum/Nodes: No enlarged mediastinal, hilar, or axillary lymph nodes. The esophagus is decompressed. Hypodense right thyroid nodule measures 2.3 cm in unchanged from 2014 CT. Lungs/Pleura: Minor scattered atelectasis. No confluent consolidation. No pulmonary edema or pleural fluid. No pulmonary mass. Musculoskeletal: Scoliosis and degenerative change in the spine. There are no acute or suspicious osseous abnormalities. Review of the MIP images confirms the above findings. CTA ABDOMEN AND PELVIS FINDINGS VASCULAR Aorta: Moderate atherosclerosis without aneurysm, dissection, significant stenosis or vasculitis. Celiac: Patent without dissection or significant stenosis. SMA: Patent without dissection or significant stenosis. Renals: 2 left and single right renal arteries. All renal arteries are patent. No stenosis or dissection. IMA: Patent. Inflow: Patent with mild atherosclerosis. No dissection or stenosis. Veins: No obvious venous abnormality within the limitations of this arterial phase study. Review of the MIP images confirms the above findings. NON-VASCULAR Hepatobiliary: A hypodense lesion at the right hepatic dome, unchanged from prior. No new hepatic abnormality. Clips in the gallbladder fossa postcholecystectomy. No biliary dilatation. Pancreas: No ductal dilatation or inflammation. Spleen: Normal in size and arterial phase enhancement. Adrenals/Urinary Tract: Normal adrenal glands. No hydronephrosis or perinephric edema. Homogeneous renal enhancement. Urinary bladder is physiologically distended without wall thickening. Stomach/Bowel: Post gastric bypass. Detailed bowel evaluation better assessed on prior contrast-enhanced exam. No obstruction, wall thickening, or inflammation. There is colonic tortuosity. Mild colonic diverticulosis without diverticulitis. Normal appendix. Lymphatic: No adenopathy. Reproductive: Uterus and  bilateral adnexa are unremarkable. Other: No free air or free fluid. Musculoskeletal: Scoliosis and degenerative change in the spine. There are no acute or suspicious osseous abnormalities. Review of the MIP images confirms the above findings. IMPRESSION: 1. Thoracoabdominal aortic atherosclerosis without dissection or acute aortic abnormality. 2. Mild contrast refluxing into the hepatic veins and IVC suggest elevated right heart pressures. Borderline cardiomegaly. 3. Unchanged 2.3 cm right thyroid nodule dating back 2014. Consider elective thyroid  ultrasound for characterization on an outpatient basis. 4. 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 Colonic diverticulosis without diverticulitis. Aortic Atherosclerosis (ICD10-I70.0). Electronically Signed   By: Keith Rake M.D.   On: 08/21/2018 04:41     EKG: Independently reviewed.  Sinus rhythm, QTC 445, low voltage, anteroseptal infarction pattern  Assessment/Plan Principal Problem:   Chest pain Active Problems:   Essential hypertension   Hyperlipidemia   Hypothyroidism   GERD (gastroesophageal reflux disease)   Iron deficiency anemia   Bradycardia   Chest pain: Her chest pain is radiating into the upper mid back between shoulder blades, but CT angiogram is negative for dissection.   - will place on Tele bed for obs - cycle CE q6 x3 and repeat EKG in the am  - prn Nitroglycerin, Morphine, and aspirin, pravastatin - Risk factor stratification: will check FLP, UDS and A1C  - 2d echo - inpt card consult was requested via Epic  HTN: Blood pressure 164/73.  Not taking medications at home. -Start amlodipine 5 mg daily -IV hydralazine prn  Hyperlipidemia: -Pravastatin  Hypothyroidism: Last TSH was 2.67 on 07/31/2016 -Continue home Synthroid  GERD and gastric ulcer disease -Protonix and Carafate  Iron deficiency anemia: Hemoglobin stable, 11.9. -Continue iron supplement  History of sinus bradycardia: Heart rate 49 on  admission per - Telemetry monitor -Avoid AV node blockers    DVT ppx: SQ Lovenox Code Status: Full code Family Communication:    Yes, patient's husband at bed side Disposition Plan:  Anticipate discharge back to previous home environment Consults called:  none Admission status: Obs / tele     Date of Service 08/21/2018    Ivor Costa [facility] Pager [phone]  If 7PM-7AM, please contact night-coverage [email or web address] Password [email or web address] 08/21/2018, 7:06 AM

## 2018-08-22 DIAGNOSIS — R0789 Other chest pain: Secondary | ICD-10-CM | POA: Diagnosis not present

## 2018-08-22 DIAGNOSIS — E7800 Pure hypercholesterolemia, unspecified: Secondary | ICD-10-CM | POA: Diagnosis not present

## 2018-08-22 DIAGNOSIS — I10 Essential (primary) hypertension: Secondary | ICD-10-CM

## 2018-08-22 DIAGNOSIS — E039 Hypothyroidism, unspecified: Secondary | ICD-10-CM

## 2018-08-22 DIAGNOSIS — R001 Bradycardia, unspecified: Secondary | ICD-10-CM | POA: Diagnosis not present

## 2018-08-22 DIAGNOSIS — R079 Chest pain, unspecified: Secondary | ICD-10-CM | POA: Diagnosis not present

## 2018-08-22 DIAGNOSIS — E785 Hyperlipidemia, unspecified: Secondary | ICD-10-CM

## 2018-08-22 LAB — CBC WITH DIFFERENTIAL/PLATELET
Abs Immature Granulocytes: 0.01 10*3/uL (ref 0.00–0.07)
Basophils Absolute: 0 10*3/uL (ref 0.0–0.1)
Basophils Relative: 1 %
Eosinophils Absolute: 0.3 10*3/uL (ref 0.0–0.5)
Eosinophils Relative: 4 %
HEMATOCRIT: 36 % (ref 36.0–46.0)
Hemoglobin: 11.4 g/dL — ABNORMAL LOW (ref 12.0–15.0)
Immature Granulocytes: 0 %
LYMPHS ABS: 2.6 10*3/uL (ref 0.7–4.0)
Lymphocytes Relative: 35 %
MCH: 30.2 pg (ref 26.0–34.0)
MCHC: 31.7 g/dL (ref 30.0–36.0)
MCV: 95.2 fL (ref 80.0–100.0)
MONO ABS: 0.8 10*3/uL (ref 0.1–1.0)
MONOS PCT: 10 %
Neutro Abs: 3.9 10*3/uL (ref 1.7–7.7)
Neutrophils Relative %: 50 %
Platelets: 234 10*3/uL (ref 150–400)
RBC: 3.78 MIL/uL — ABNORMAL LOW (ref 3.87–5.11)
RDW: 13.2 % (ref 11.5–15.5)
WBC: 7.6 10*3/uL (ref 4.0–10.5)
nRBC: 0 % (ref 0.0–0.2)

## 2018-08-22 LAB — COMPREHENSIVE METABOLIC PANEL
ALT: 17 U/L (ref 0–44)
AST: 18 U/L (ref 15–41)
Albumin: 3.1 g/dL — ABNORMAL LOW (ref 3.5–5.0)
Alkaline Phosphatase: 81 U/L (ref 38–126)
Anion gap: 7 (ref 5–15)
BILIRUBIN TOTAL: 1.5 mg/dL — AB (ref 0.3–1.2)
BUN: 8 mg/dL (ref 8–23)
CALCIUM: 8.7 mg/dL — AB (ref 8.9–10.3)
CHLORIDE: 108 mmol/L (ref 98–111)
CO2: 25 mmol/L (ref 22–32)
CREATININE: 0.94 mg/dL (ref 0.44–1.00)
Glucose, Bld: 90 mg/dL (ref 70–99)
Potassium: 3.9 mmol/L (ref 3.5–5.1)
Sodium: 140 mmol/L (ref 135–145)
TOTAL PROTEIN: 5.4 g/dL — AB (ref 6.5–8.1)

## 2018-08-22 LAB — MAGNESIUM: MAGNESIUM: 1.9 mg/dL (ref 1.7–2.4)

## 2018-08-22 MED ORDER — ACYCLOVIR 400 MG PO TABS: 400.0000 mg | ORAL_TABLET | Freq: Every day | ORAL | Status: DC | PRN

## 2018-08-22 MED ORDER — MELOXICAM 15 MG PO TABS: 15.0000 mg | ORAL_TABLET | Freq: Every day | ORAL | Status: DC | PRN

## 2018-08-22 MED ORDER — GABAPENTIN 100 MG PO CAPS: 100.0000 mg | ORAL_CAPSULE | Freq: Two times a day (BID) | ORAL | Status: DC

## 2018-08-22 MED ORDER — ONDANSETRON HCL 4 MG PO TABS: 4.0000 mg | ORAL_TABLET | Freq: Three times a day (TID) | ORAL | 0 refills | Status: DC | PRN

## 2018-08-22 MED ORDER — POTASSIUM CHLORIDE ER 10 MEQ PO TBCR: 10.0000 meq | EXTENDED_RELEASE_TABLET | Freq: Every day | ORAL | Status: DC

## 2018-08-22 MED ORDER — ALUM & MAG HYDROXIDE-SIMETH 200-200-20 MG/5ML PO SUSP: 15.0000 mL | Freq: Four times a day (QID) | ORAL | 0 refills | Status: DC | PRN

## 2018-08-22 MED ORDER — FLUTICASONE PROPIONATE 50 MCG/ACT NA SUSP: 2.0000 | Freq: Every day | NASAL | Status: AC | PRN

## 2018-08-22 MED ORDER — LEVOTHYROXINE SODIUM 75 MCG PO TABS: 37.5000 ug | ORAL_TABLET | ORAL | Status: DC

## 2018-08-22 MED ORDER — ATORVASTATIN CALCIUM 40 MG PO TABS: 40.0000 mg | ORAL_TABLET | Freq: Every day | ORAL | 0 refills | Status: DC

## 2018-08-22 MED ORDER — PANTOPRAZOLE SODIUM 40 MG PO TBEC: 40.0000 mg | DELAYED_RELEASE_TABLET | Freq: Every day | ORAL | 0 refills | Status: DC

## 2018-08-22 MED ORDER — NITROGLYCERIN 0.4 MG SL SUBL: 0.4000 mg | SUBLINGUAL_TABLET | SUBLINGUAL | 0 refills | Status: DC | PRN

## 2018-08-22 MED ORDER — VITAMIN D (ERGOCALCIFEROL) 1.25 MG (50000 UNIT) PO CAPS: 50000.0000 [IU] | ORAL_CAPSULE | ORAL | Status: DC

## 2018-08-22 MED ORDER — AMLODIPINE BESYLATE 5 MG PO TABS: 5.0000 mg | ORAL_TABLET | Freq: Every day | ORAL | 0 refills | Status: DC

## 2018-08-22 MED ORDER — BIOTIN 10 MG PO TABS: 1.0000 | ORAL_TABLET | Freq: Every day | ORAL | Status: DC

## 2018-08-22 NOTE — Discharge Summary (Signed)
Physician Discharge Summary  Tracy Paul GBT:[id number] DOB: [birth year] DOA: 08/20/2018  PCP: [name], MD  Admit date: 08/20/2018 Discharge date: 08/22/2018  Admitted From: Home Disposition:  Home  Recommendations for Outpatient Follow-up:  1. Follow up with PCP in 1 week 2. Outpatient follow-up with cardiology 3. Follow-up in the ED if symptoms worsen or new appear   Home Health: No Equipment/Devices: None  Discharge Condition: Stable CODE STATUS: Full Diet recommendation: Heart Healthy  Brief/Interim Summary: 67 year old female with history of hypertension, hyperlipidemia, prediabetes, hypothyroidism, depression, iron deficiency anemia, gastric ulcer disease, bradycardia, gastric Roux-en-Y bypass presented with  chest pain and shortness of breath.  Cardiology was consulted.  Troponins have been negative so far.  CT angiogram of chest/abdomen/pelvis was negative for aortic dissection.  Cardiology ordered coronary CTA to exclude any evidence of high risk CAD.  Patient is currently chest pain-free and cardiology has cleared the patient for discharge.  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  FFR analysis from CT scan showed proximal and mid vessels all have normal FFR CT.  The abnormal values are in the very distal LAD and very distal but IM/D1: Cardiology recommended medical management with 81 mg aspirin and high-intensity statin atorvastatin 40 mg daily.  Outpatient follow-up with cardiology.  Cardiology has cleared the patient for discharge  Essential hypertension -Continue amlodipine which was started during hospitalization.  Outpatient  follow-up  Hyperlipidemia -Pravastatin was switched to atorvastatin 40 mg daily  Hypothyroidism -Continue Synthroid  GERD -Continue Protonix  History of sinus bradycardia -Stable.  As symptomatic.  No indication for pacing at this time as per cardiology.  Continue to avoid AV nodal blocking agents.   Discharge Instructions  Discharge Instructions    Call MD for:  difficulty breathing, headache or visual disturbances   Complete by:  As directed    Call MD for:  extreme fatigue   Complete by:  As directed    Call MD for:  hives   Complete by:  As directed    Call MD for:  persistant dizziness or light-headedness   Complete by:  As directed    Call MD for:  persistant nausea and vomiting   Complete by:  As directed    Call MD for:  redness, tenderness, or signs of infection (pain, swelling, redness, odor or green/yellow discharge around incision site)   Complete by:  As directed    Call MD for:  temperature >100.4   Complete by:  As directed    Diet - low sodium heart healthy   Complete by:  As directed    Increase activity slowly   Complete by:  As directed      Allergies as of 08/22/2018      Reactions   Codeine Other (See Comments)   "puts me out of it".  Dysphoria.  Patient tolerates Tramadol without complications.   Nabumetone    GI upset      Medication List    STOP taking these medications   ciprofloxacin 250 MG tablet Commonly known as:  CIPRO   omeprazole 20 MG capsule Commonly known as:  PRILOSEC Replaced by:  pantoprazole 40 MG tablet   pravastatin 40 MG tablet Commonly known as:  PRAVACHOL   predniSONE 20 MG tablet Commonly known as:  DELTASONE   sucralfate 1 g tablet Commonly known as:  CARAFATE     TAKE these medications   acyclovir 400 MG tablet Commonly known as:  ZOVIRAX Take 1 tablet (400 mg total) by mouth daily as needed (outbreak).   alum & mag hydroxide-simeth 200-200-20 MG/5ML suspension Commonly known as:   MAALOX/MYLANTA Take 15 mLs by mouth every 6 (six) hours as needed for indigestion or heartburn.   amLODipine 5 MG tablet Commonly known as:  NORVASC Take 1 tablet (5 mg total) by mouth daily. Start taking on:  08/23/2018   aspirin 81 MG tablet Take 81 mg by mouth daily.   atorvastatin 40 MG tablet Commonly known as:  LIPITOR Take 1 tablet (40 mg total) by mouth daily at 6 PM.   Biotin 10 MG Tabs Take 1 tablet (10 mg total) by mouth daily. What changed:  how much to take   CALCIUM CITRATE + D3 PO Take 600 mg by mouth daily.   cyclobenzaprine 10 MG tablet Commonly known as:  FLEXERIL Take 1 tablet (10 mg total) by mouth 3 (three) times daily as needed for muscle spasms.   diphenhydramine-acetaminophen 25-500 MG Tabs tablet Commonly known as:  TYLENOL PM Take 1 tablet by mouth at bedtime.   fish oil-omega-3 fatty acids 1000 MG capsule Take 1 g by mouth daily.   fluticasone 50 MCG/ACT nasal spray Commonly known as:  FLONASE Place 2 sprays into both nostrils daily as needed for allergies.   gabapentin 100 MG capsule Commonly known as:  NEURONTIN Take 1 capsule (100 mg total) by mouth 2 (two) times daily.   levothyroxine 75 MCG tablet Commonly known as:  SYNTHROID, LEVOTHROID Take 0.5-1 tablets (37.5-75 mcg total) by mouth See admin instructions. Take 0.5 tablet everyday except Monday and Thursday take 1 tablet.   Magnesium 250 MG Tabs Take 1 tablet by mouth daily.   Melatonin 10 MG Tabs Take 1 tablet by mouth at bedtime.   meloxicam 15 MG tablet Commonly known as:  MOBIC Take 1 tablet (15 mg total) by mouth daily as needed for pain.   nitroGLYCERIN 0.4 MG SL tablet Commonly known as:  NITROSTAT Place 1 tablet (0.4 mg total) under the tongue every 5 (five) minutes as needed for chest pain.   ondansetron 4 MG tablet Commonly known as:  ZOFRAN Take 1 tablet (4 mg total) by mouth every 8 (eight) hours as needed for nausea or vomiting.   pantoprazole 40 MG  tablet Commonly known as:  PROTONIX Take 1 tablet (40 mg total) by mouth daily. Start taking on:  08/23/2018 Replaces:  omeprazole 20 MG capsule   potassium chloride 10 MEQ tablet Commonly known as:  K-DUR Take 1 tablet (10 mEq total) by mouth daily.   rOPINIRole 0.5 MG tablet Commonly known as:  REQUIP TAKE ONE-HALF (1/2) TABLET AT NIGHT. CAN INCREASE TO TAKE ONE TABLET, THEN MAY INCREASE UP TO 4 TABLETS AT NIGHT AS DIRECTED. What changed:    how much to take  how to take this  when to take this  additional instructions   SLOW RELEASE IRON PO Take 1 tablet by mouth daily.   vitamin B-12 1000 MCG tablet Commonly known as:  CYANOCOBALAMIN Take 1,000 mcg by mouth daily.   Vitamin C 500 MG Caps Take 1 tablet by mouth daily.   Vitamin D (Ergocalciferol) 50000 units Caps capsule Commonly known as:  DRISDOL Take 1 capsule (50,000 Units  total) by mouth every other day.      Follow-up Information    [name], MD. Schedule an appointment as soon as possible for a visit in 1 week(s).   Specialty:  Internal Medicine Contact information: [address] [place] [address] [phone]        Minus Breeding, MD .   Specialty:  Cardiology Contact information: [address] [place] [address] [phone]          Allergies  Allergen Reactions  . Codeine Other (See Comments)    "puts me out of it".  Dysphoria.  Patient tolerates Tramadol without complications.  . Nabumetone     GI upset    Consultations:  Cardiology   Procedures/Studies: Dg Chest 2 View  Result Date: 08/20/2018 CLINICAL DATA:  Chest pain EXAM: CHEST - 2 VIEW COMPARISON:  12/20/2015 FINDINGS: The heart size and mediastinal contours are within normal limits. Both lungs are clear. The visualized skeletal structures are unremarkable. IMPRESSION: No active cardiopulmonary disease. Electronically Signed   By: Donavan Foil M.D.   On: 08/20/2018 21:56    Ct Abdomen Pelvis W Contrast  Result Date: 08/12/2018 CLINICAL DATA:  Right-sided flank pain for the past month. History of gastric bypass and tubal ligation. EXAM: CT ABDOMEN AND PELVIS WITH CONTRAST TECHNIQUE: Multidetector CT imaging of the abdomen and pelvis was performed using the standard protocol following bolus administration of intravenous contrast. CONTRAST:  131mL ISOVUE-300 IOPAMIDOL (ISOVUE-300) INJECTION 61% COMPARISON:  08/01/2017; 10/03/2013; lumbar spine MRI-02/06/2018 FINDINGS: Lower chest: Limited visualization of the lower thorax is negative for focal airspace opacity or pleural effusion. Normal heart size.  No pericardial effusion. Hepatobiliary: Normal hepatic contour. There is an approximately 1.1 cm hypoattenuating nonenhancing cyst within the dome of the right lobe of the liver, grossly unchanged compared to the 07/2017 examination. No new hepatic lesions. Post cholecystectomy. No intra or extrahepatic biliary ductal dilatation. No ascites. Pancreas: No appearance the pancreas Spleen: Normal appearance of the spleen Adrenals/Urinary Tract: There is symmetric enhancement and excretion of the bilateral kidneys. No definite renal stones. No discrete renal lesions. No urine obstruction or perinephric stranding. Normal appearance the bilateral adrenal glands. Normal appearance of the urinary bladder given degree distention. Stomach/Bowel: Stable sequela of Roux-en-Y gastric bypass. Ingested enteric contrast extends to the level of the rectum. Scattered minimal colonic diverticulosis without evidence of superimposed acute diverticulitis. Normal appearance of the terminal ileum and retrocecal appendix. No pneumoperitoneum, pneumatosis or portal venous gas. Vascular/Lymphatic: Eccentric calcified plaque within a normal caliber abdominal aorta, not resulting in a hemodynamically significant stenosis. Major branch vessels of the abdominal aorta appear widely patent on this non CTA examination.  No bulky retroperitoneal, mesenteric, pelvic or inguinal lymphadenopathy. Reproductive: Normal appearance of the pelvic organs. No discrete adnexal lesion. No free fluid within the pelvic cul-de-sac. Other: Subcutaneous calcification about the right anterior superior iliac spine. Musculoskeletal: No acute or aggressive osseous abnormalities. Mild presumably degenerative scoliotic curvature of the thoracolumbar spine. Mild-to-moderate multilevel lumbar spine DDD, worse at L3-L4 and L5-S1 with disc space height loss, endplate irregularity and sclerosis. IMPRESSION: 1. No definite explanation for patient's right-sided flank pain. Specifically, no evidence of enteric or urinary obstruction. No evidence of nephrolithiasis on this noncontrast examination. 2. Sequela of prior gastric Roux-en-Y bypass without evidence of enteric obstruction. 3. Minimal colonic diverticulosis without evidence of diverticulitis. 4.  Aortic Atherosclerosis (ICD10-I70.0). Electronically Signed   By: Sandi Mariscal M.D.   On: 08/12/2018 10:52   Ct Coronary Morph W/cta  Cor W/score W/ca W/cm &/or Wo/cm  Addendum Date: 08/21/2018   ADDENDUM REPORT: 08/21/2018 16:58 CLINICAL DATA:  Chest pain EXAM: Cardiac CTA MEDICATIONS: Sub lingual nitro. 4mg  and lopressor 0mg  TECHNIQUE: The patient was scanned on a Siemens 527 slice scanner. Gantry rotation speed was 270 msecs. Collimation was .1mm. A 100 kV prospective scan was triggered in the descending thoracic aorta at 111 HU's with 5% padding centered around 78% of the R-R interval. Average HR during the scan was 49 bpm. The 3D data set was interpreted on a dedicated work station using MPR, MIP and VRT modes. A total of 80cc of contrast was used. FINDINGS: Non-cardiac: See separate report from [facility]. No significant findings on limited lung and soft tissue windows. Calcium Score: Calcium noted in all 3 coronary vessels Coronary Arteries: Right dominant with no anomalies LM: Normal LAD:  Ostial/proximal LAD less than 50% calcific stenosis Less than 30% calcific mid vessel stenosis D1: Normal D2: Normal Circumflex: Less than 30% calcific mid vessel stenosis OM1: Normal OM2: Normal RCA: Less than 50% proximal and mid calcific stenosis PDA: Normal PLA: Normal IMPRESSION: 1. 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 Calcium score 172 noted in all 3 vessels which is 50 th percentile for age and sex [place] Rouge Electronically Signed   By: Jenkins Rouge M.D.   On: 08/21/2018 16:58   Result Date: 08/21/2018 EXAM: OVER-READ INTERPRETATION  CT CHEST The following report is an over-read performed by radiologist Dr. Abigail Miyamoto of [facility], [place] on 08/21/2018. This over-read does not include interpretation of cardiac or coronary anatomy or pathology. The coronary CTA interpretation by the cardiologist is attached. COMPARISON:  Diagnostic CT of earlier today FINDINGS: Vascular: Normal aortic contour, without dissection. Aortic atherosclerosis. No central pulmonary embolism, on this non-dedicated study. Mediastinum/Nodes: No imaged thoracic adenopathy. Lungs/Pleura: No imaged pleural fluid. Bibasilar atelectasis. Upper Abdomen: Normal imaged portions of the liver, spleen. Surgical changes about the stomach. Musculoskeletal: No acute osseous abnormality. IMPRESSION: 1.  No acute findings in the imaged extracardiac chest. 2.  Aortic Atherosclerosis (ICD10-I70.0). Electronically Signed: By: Abigail Miyamoto M.D. On: 08/21/2018 16:28   Ct Coronary Fractional Flow Reserve Data Prep  Result Date: 08/22/2018 CLINICAL DATA:  CAD EXAM: FFR CT MEDICATIONS: None TECHNIQUE: The best systolic and diastolic phases of the patients gated cardiac CTA sent to [facility] for hemodynamic analysis FINDINGS: RCA: Proximal .98 Mid .91 LAD: Proximal .94 Mid.91 very distal vessel .6 Circumflex:.97 mid .34 D1/IM: Very distal vessel .78 IMPRESSION: The proximal and mid vessels all have normal  FFR CT. The abnormal values are in the very distal LAD and very distal IM/D1 would Rx medically [place] Electronically Signed   By: Jenkins Rouge M.D.   On: 08/22/2018 09:36   Ct Angio Chest/abd/pel For Dissection W And/or W/wo  Result Date: 08/21/2018 CLINICAL DATA:  Chest pain radiating to the back. Clinical concern for dissection EXAM: CT ANGIOGRAPHY CHEST, ABDOMEN AND PELVIS TECHNIQUE: Multidetector CT imaging through the chest, abdomen and pelvis was performed using the standard protocol during bolus administration of intravenous contrast. Multiplanar reconstructed images and MIPs were obtained and reviewed to evaluate the vascular anatomy. CONTRAST:  152mL ISOVUE-370 IOPAMIDOL (ISOVUE-370) INJECTION 76% COMPARISON:  Abdominal CT 08/12/2018.  Chest radiographs yesterday. FINDINGS: CTA CHEST FINDINGS Cardiovascular: Normal caliber thoracic aorta with atherosclerosis. No dissection, aneurysm, aortic hematoma or acute aortic syndrome. Common origin of the brachiocephalic and left common carotid from the aortic arch,  normal variant anatomy. No filling defects in the central pulmonary arteries to suggest pulmonary embolus. Borderline cardiomegaly. Contrast refluxes into the hepatic veins and IVC. No pericardial effusion. There are scattered coronary artery calcifications. Mediastinum/Nodes: No enlarged mediastinal, hilar, or axillary lymph nodes. The esophagus is decompressed. Hypodense right thyroid nodule measures 2.3 cm in unchanged from 2014 CT. Lungs/Pleura: Minor scattered atelectasis. No confluent consolidation. No pulmonary edema or pleural fluid. No pulmonary mass. Musculoskeletal: Scoliosis and degenerative change in the spine. There are no acute or suspicious osseous abnormalities. Review of the MIP images confirms the above findings. CTA ABDOMEN AND PELVIS FINDINGS VASCULAR Aorta: Moderate atherosclerosis without aneurysm, dissection, significant stenosis or vasculitis. Celiac: Patent without  dissection or significant stenosis. SMA: Patent without dissection or significant stenosis. Renals: 2 left and single right renal arteries. All renal arteries are patent. No stenosis or dissection. IMA: Patent. Inflow: Patent with mild atherosclerosis. No dissection or stenosis. Veins: No obvious venous abnormality within the limitations of this arterial phase study. Review of the MIP images confirms the above findings. NON-VASCULAR Hepatobiliary: A hypodense lesion at the right hepatic dome, unchanged from prior. No new hepatic abnormality. Clips in the gallbladder fossa postcholecystectomy. No biliary dilatation. Pancreas: No ductal dilatation or inflammation. Spleen: Normal in size and arterial phase enhancement. Adrenals/Urinary Tract: Normal adrenal glands. No hydronephrosis or perinephric edema. Homogeneous renal enhancement. Urinary bladder is physiologically distended without wall thickening. Stomach/Bowel: Post gastric bypass. Detailed bowel evaluation better assessed on prior contrast-enhanced exam. No obstruction, wall thickening, or inflammation. There is colonic tortuosity. Mild colonic diverticulosis without diverticulitis. Normal appendix. Lymphatic: No adenopathy. Reproductive: Uterus and bilateral adnexa are unremarkable. Other: No free air or free fluid. Musculoskeletal: Scoliosis and degenerative change in the spine. There are no acute or suspicious osseous abnormalities. Review of the MIP images confirms the above findings. IMPRESSION: 1. Thoracoabdominal aortic atherosclerosis without dissection or acute aortic abnormality. 2. Mild contrast refluxing into the hepatic veins and IVC suggest elevated right heart pressures. Borderline cardiomegaly. 3. Unchanged 2.3 cm right thyroid nodule dating back 2014. Consider elective thyroid ultrasound for characterization on an outpatient basis. 4. 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 Colonic diverticulosis without diverticulitis. Aortic  Atherosclerosis (ICD10-I70.0). Electronically Signed   By: Keith Rake M.D.   On: 08/21/2018 04:41    Subjective: Patient seen and examined at bedside.  She feels much better and denies any current chest pain, nausea or vomiting.  Discharge Exam: Vitals:   08/21/18 2345 08/22/18 0535  BP: 120/61 104/86  Pulse: (!) 49 (!) 49  Resp:    Temp: 98.1 F (36.7 C) 98.3 F (36.8 C)  SpO2: 94% 94%   Vitals:   08/21/18 1612 08/21/18 2026 08/21/18 2345 08/22/18 0535  BP: (!) 152/77 (!) 135/59 120/61 104/86  Pulse: (!) 50 (!) 47 (!) 49 (!) 49  Resp:      Temp: 97.8 F (36.6 C) 98.2 F (36.8 C) 98.1 F (36.7 C) 98.3 F (36.8 C)  TempSrc: Oral Oral Oral Oral  SpO2: 93% 93% 94% 94%  Weight:    79.9 kg  Height:        General: Pt is alert, awake, not in acute distress Cardiovascular: Bradycardic,, S1/S2 + Respiratory: bilateral decreased breath sounds at bases Abdominal: Soft, NT, ND, bowel sounds + Extremities: no edema, no cyanosis    The results of significant diagnostics from this hospitalization (including imaging, microbiology, ancillary and laboratory) are listed below for reference.     Microbiology: No results found for this  or any previous visit (from the past 240 hour(s)).   Labs: BNP (last 3 results) No results for input(s): BNP in the last 8760 hours. Basic Metabolic Panel: Recent Labs  Lab 08/20/18 2138 08/22/18 0630  NA 139 140  K 4.4 3.9  CL 109 108  CO2 25 25  GLUCOSE 93 90  BUN 11 8  CREATININE 0.96 0.94  CALCIUM 8.9 8.7*  MG  --  1.9   Liver Function Tests: Recent Labs  Lab 08/22/18 0630  AST 18  ALT 17  ALKPHOS 81  BILITOT 1.5*  PROT 5.4*  ALBUMIN 3.1*   No results for input(s): LIPASE, AMYLASE in the last 168 hours. No results for input(s): AMMONIA in the last 168 hours. CBC: Recent Labs  Lab 08/20/18 2138 08/22/18 0630  WBC 10.5 7.6  NEUTROABS  --  3.9  HGB 11.9* 11.4*  HCT 39.5 36.0  MCV 97.3 95.2  PLT 290 234    Cardiac Enzymes: Recent Labs  Lab 08/21/18 0519 08/21/18 0809 08/21/18 1422  TROPONINI <0.03 <0.03 <0.03   BNP: Invalid input(s): POCBNP CBG: No results for input(s): GLUCAP in the last 168 hours. D-Dimer No results for input(s): DDIMER in the last 72 hours. Hgb A1c Recent Labs    08/21/18 0519  HGBA1C 5.5   Lipid Profile Recent Labs    08/21/18 0519  CHOL 156  HDL 64  LDLCALC 85  TRIG 36  CHOLHDL 2.4   Thyroid function studies No results for input(s): TSH, T4TOTAL, T3FREE, THYROIDAB in the last 72 hours.  Invalid input(s): FREET3 Anemia work up No results for input(s): VITAMINB12, FOLATE, FERRITIN, TIBC, IRON, RETICCTPCT in the last 72 hours. Urinalysis    Component Value Date/Time   COLORURINE YELLOW 07/31/2018 1322   APPEARANCEUR CLOUDY (A) 07/31/2018 1322   LABSPEC 1.011 07/31/2018 1322   PHURINE < OR = 5.0 07/31/2018 1322   GLUCOSEU NEGATIVE 07/31/2018 1322   HGBUR NEGATIVE 07/31/2018 1322   BILIRUBINUR NEGATIVE 05/23/2016 1221   KETONESUR NEGATIVE 07/31/2018 1322   PROTEINUR NEGATIVE 07/31/2018 1322   UROBILINOGEN 0.2 10/03/2013 1524   NITRITE NEGATIVE 07/31/2018 1322   LEUKOCYTESUR 3+ (A) 07/31/2018 1322   Sepsis Labs Invalid input(s): PROCALCITONIN,  WBC,  LACTICIDVEN Microbiology No results found for this or any previous visit (from the past 240 hour(s)).   Time coordinating discharge: 35 minutes  SIGNED:   Aline August, MD  [facility] 08/22/2018, 10:38 AM Pager: [phone]  If 7PM-7AM, please contact night-coverage [email or web address] Password [email or web address]

## 2018-08-22 NOTE — Progress Notes (Addendum)
Progress Note  Patient Name: Tracy Paul Date of Encounter: 08/22/2018  Primary Cardiologist: Minus Breeding, MD   Subjective   Feeling better today. Was bothered with migraine HA yesterday. Resolved. She denies any recurrent CP. No dyspnea.   Inpatient Medications    Scheduled Meds: . diphenhydrAMINE  25 mg Oral QHS   And  . acetaminophen  500 mg Oral QHS  . amLODipine  5 mg Oral Daily  . aspirin  81 mg Oral Daily  . atorvastatin  40 mg Oral q1800  . enoxaparin (LOVENOX) injection  40 mg Subcutaneous Q24H  . ferrous sulfate  325 mg Oral Daily  . gabapentin  100 mg Oral BID  . levothyroxine  37.5 mcg Oral Once per day on Sun Tue Wed Fri Sat  . levothyroxine  75 mcg Oral Once per day on Mon Thu  . Melatonin  9 mg Oral QHS  . omega-3 acid ethyl esters  1 g Oral Daily  . pantoprazole  40 mg Oral Daily  . rOPINIRole  0.5 mg Oral QHS  . vitamin B-12  1,000 mcg Oral Daily   Continuous Infusions: . sodium chloride 75 mL/hr at 08/22/18 0653   PRN Meds: acetaminophen, alum & mag hydroxide-simeth, cyclobenzaprine, fluticasone, hydrALAZINE, ibuprofen, ketorolac, metoCLOPramide (REGLAN) injection, morphine injection, nitroGLYCERIN, ondansetron (ZOFRAN) IV, zolpidem   Vital Signs    Vitals:   08/21/18 1612 08/21/18 2026 08/21/18 2345 08/22/18 0535  BP: (!) 152/77 (!) 135/59 120/61 104/86  Pulse: (!) 50 (!) 47 (!) 49 (!) 49  Resp:      Temp: 97.8 F (36.6 C) 98.2 F (36.8 C) 98.1 F (36.7 C) 98.3 F (36.8 C)  TempSrc: Oral Oral Oral Oral  SpO2: 93% 93% 94% 94%  Weight:    79.9 kg  Height:        Intake/Output Summary (Last 24 hours) at 08/22/2018 0802 Last data filed at 08/22/2018 0653 Gross per 24 hour  Intake 1746.58 ml  Output -  Net 1746.58 ml   Filed Weights   08/21/18 0450 08/22/18 0535  Weight: 81.4 kg 79.9 kg    Telemetry    Sinus brady in the 40s, brief run of SVT 140 - Personally Reviewed  ECG    08/21/18 sinus brady 49 bpm - Personally  Reviewed  Physical Exam   GEN: No acute distress.   Neck: No JVD Cardiac:  regular rhythm, slow rate, no murmurs, rubs, or gallops.  Respiratory: Clear to auscultation bilaterally. GI: Soft, nontender, non-distended  MS: No edema; No deformity. Neuro:  Nonfocal  Psych: Normal affect   Labs    Chemistry Recent Labs  Lab 08/20/18 2138  NA 139  K 4.4  CL 109  CO2 25  GLUCOSE 93  BUN 11  CREATININE 0.96  CALCIUM 8.9  GFRNONAA >60  GFRAA >60  ANIONGAP 5     Hematology Recent Labs  Lab 08/20/18 2138 08/22/18 0630  WBC 10.5 7.6  RBC 4.06 3.78*  HGB 11.9* 11.4*  HCT 39.5 36.0  MCV 97.3 95.2  MCH 29.3 30.2  MCHC 30.1 31.7  RDW 13.4 13.2  PLT 290 234    Cardiac Enzymes Recent Labs  Lab 08/21/18 0519 08/21/18 0809 08/21/18 1422  TROPONINI <0.03 <0.03 <0.03    Recent Labs  Lab 08/20/18 2155  TROPIPOC 0.01     BNPNo results for input(s): BNP, PROBNP in the last 168 hours.   DDimer No results for input(s): DDIMER in the last 168 hours.  Radiology    Dg Chest 2 View  Result Date: 08/20/2018 CLINICAL DATA:  Chest pain EXAM: CHEST - 2 VIEW COMPARISON:  12/20/2015 FINDINGS: The heart size and mediastinal contours are within normal limits. Both lungs are clear. The visualized skeletal structures are unremarkable. IMPRESSION: No active cardiopulmonary disease. Electronically Signed   By: Donavan Foil M.D.   On: 08/20/2018 21:56   Ct Coronary Morph W/cta Cor W/score W/ca W/cm &/or Wo/cm  Addendum Date: 08/21/2018   ADDENDUM REPORT: 08/21/2018 16:58 CLINICAL DATA:  Chest pain EXAM: Cardiac CTA MEDICATIONS: Sub lingual nitro. 4mg  and lopressor 0mg  TECHNIQUE: The patient was scanned on a Siemens 546 slice scanner. Gantry rotation speed was 270 msecs. Collimation was .59mm. A 100 kV prospective scan was triggered in the descending thoracic aorta at 111 HU's with 5% padding centered around 78% of the R-R interval. Average HR during the scan was 49 bpm. The 3D data  set was interpreted on a dedicated work station using MPR, MIP and VRT modes. A total of 80cc of contrast was used. FINDINGS: Non-cardiac: See separate report from [facility]. No significant findings on limited lung and soft tissue windows. Calcium Score: Calcium noted in all 3 coronary vessels Coronary Arteries: Right dominant with no anomalies LM: Normal LAD: Ostial/proximal LAD less than 50% calcific stenosis Less than 30% calcific mid vessel stenosis D1: Normal D2: Normal Circumflex: Less than 30% calcific mid vessel stenosis OM1: Normal OM2: Normal RCA: Less than 50% proximal and mid calcific stenosis PDA: Normal PLA: Normal IMPRESSION: 1. 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 Calcium score 172 noted in all 3 vessels which is 65 th percentile for age and sex Jenkins Rouge Electronically Signed   By: Jenkins Rouge M.D.   On: 08/21/2018 16:58   Result Date: 08/21/2018 EXAM: OVER-READ INTERPRETATION  CT CHEST The following report is an over-read performed by radiologist Dr. Abigail Miyamoto of [facility], [place] on 08/21/2018. This over-read does not include interpretation of cardiac or coronary anatomy or pathology. The coronary CTA interpretation by the cardiologist is attached. COMPARISON:  Diagnostic CT of earlier today FINDINGS: Vascular: Normal aortic contour, without dissection. Aortic atherosclerosis. No central pulmonary embolism, on this non-dedicated study. Mediastinum/Nodes: No imaged thoracic adenopathy. Lungs/Pleura: No imaged pleural fluid. Bibasilar atelectasis. Upper Abdomen: Normal imaged portions of the liver, spleen. Surgical changes about the stomach. Musculoskeletal: No acute osseous abnormality. IMPRESSION: 1.  No acute findings in the imaged extracardiac chest. 2.  Aortic Atherosclerosis (ICD10-I70.0). Electronically Signed: By: Abigail Miyamoto M.D. On: 08/21/2018 16:28   Ct Angio Chest/abd/pel For Dissection W And/or W/wo  Result Date:  08/21/2018 CLINICAL DATA:  Chest pain radiating to the back. Clinical concern for dissection EXAM: CT ANGIOGRAPHY CHEST, ABDOMEN AND PELVIS TECHNIQUE: Multidetector CT imaging through the chest, abdomen and pelvis was performed using the standard protocol during bolus administration of intravenous contrast. Multiplanar reconstructed images and MIPs were obtained and reviewed to evaluate the vascular anatomy. CONTRAST:  156mL ISOVUE-370 IOPAMIDOL (ISOVUE-370) INJECTION 76% COMPARISON:  Abdominal CT 08/12/2018.  Chest radiographs yesterday. FINDINGS: CTA CHEST FINDINGS Cardiovascular: Normal caliber thoracic aorta with atherosclerosis. No dissection, aneurysm, aortic hematoma or acute aortic syndrome. Common origin of the brachiocephalic and left common carotid from the aortic arch, normal variant anatomy. No filling defects in the central pulmonary arteries to suggest pulmonary embolus. Borderline cardiomegaly. Contrast refluxes into the hepatic veins and IVC. No pericardial effusion. There are scattered coronary artery calcifications. Mediastinum/Nodes:  No enlarged mediastinal, hilar, or axillary lymph nodes. The esophagus is decompressed. Hypodense right thyroid nodule measures 2.3 cm in unchanged from 2014 CT. Lungs/Pleura: Minor scattered atelectasis. No confluent consolidation. No pulmonary edema or pleural fluid. No pulmonary mass. Musculoskeletal: Scoliosis and degenerative change in the spine. There are no acute or suspicious osseous abnormalities. Review of the MIP images confirms the above findings. CTA ABDOMEN AND PELVIS FINDINGS VASCULAR Aorta: Moderate atherosclerosis without aneurysm, dissection, significant stenosis or vasculitis. Celiac: Patent without dissection or significant stenosis. SMA: Patent without dissection or significant stenosis. Renals: 2 left and single right renal arteries. All renal arteries are patent. No stenosis or dissection. IMA: Patent. Inflow: Patent with mild atherosclerosis.  No dissection or stenosis. Veins: No obvious venous abnormality within the limitations of this arterial phase study. Review of the MIP images confirms the above findings. NON-VASCULAR Hepatobiliary: A hypodense lesion at the right hepatic dome, unchanged from prior. No new hepatic abnormality. Clips in the gallbladder fossa postcholecystectomy. No biliary dilatation. Pancreas: No ductal dilatation or inflammation. Spleen: Normal in size and arterial phase enhancement. Adrenals/Urinary Tract: Normal adrenal glands. No hydronephrosis or perinephric edema. Homogeneous renal enhancement. Urinary bladder is physiologically distended without wall thickening. Stomach/Bowel: Post gastric bypass. Detailed bowel evaluation better assessed on prior contrast-enhanced exam. No obstruction, wall thickening, or inflammation. There is colonic tortuosity. Mild colonic diverticulosis without diverticulitis. Normal appendix. Lymphatic: No adenopathy. Reproductive: Uterus and bilateral adnexa are unremarkable. Other: No free air or free fluid. Musculoskeletal: Scoliosis and degenerative change in the spine. There are no acute or suspicious osseous abnormalities. Review of the MIP images confirms the above findings. IMPRESSION: 1. Thoracoabdominal aortic atherosclerosis without dissection or acute aortic abnormality. 2. Mild contrast refluxing into the hepatic veins and IVC suggest elevated right heart pressures. Borderline cardiomegaly. 3. Unchanged 2.3 cm right thyroid nodule dating back 2014. Consider elective thyroid ultrasound for characterization on an outpatient basis. 4. 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 Colonic diverticulosis without diverticulitis. Aortic Atherosclerosis (ICD10-I70.0). Electronically Signed   By: Keith Rake M.D.   On: 08/21/2018 04:41    Cardiac Studies   Coronary CTA 08/22/18 FINDINGS: Non-cardiac: See separate report from [facility]. No significant findings on limited  lung and soft tissue windows.  Calcium Score: Calcium noted in all 3 coronary vessels  Coronary Arteries: Right dominant with no anomalies  LM: Normal  LAD: Ostial/proximal LAD less than 50% calcific stenosis Less than 30% calcific mid vessel stenosis  D1: Normal  D2: Normal  Circumflex: Less than 30% calcific mid vessel stenosis  OM1: Normal  OM2: Normal  RCA: Less than 50% proximal and mid calcific stenosis  PDA: Normal  PLA: Normal  IMPRESSION: 1. 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 Calcium score 172 noted in all 3 vessels which is 78 th percentile for age and sex  Patient Profile     Tracy Paul is a 67 y.o. female with a hx of bradycardia, HTN, normal cardiac cath ~18 years ago, low risk stress test in 2012, HLD, prediabetes, family h/o premature CAD, remote tobacco use hypothyroidism, Iron deficiency anemia and gastric ulcer who is being seen today for the evaluation of chest pain at the request of Dr. Starla Link, Internal Medicine.  Assessment & Plan    1. Chest Pain: Chest CT negative for dissection and PE. Lipase negative. Cardiac enzymes negative x 3. Coronary CTA yesterday demonstrated calcium score of 172 with calcium noted in all 3  vessels which is the 86th percentile for age and sex. Pertinent findings outlined below.   -LAD: Ostial/proximal LAD less than 50% calcific stenosis. Less than 30% calcific mid vessel stenosis  -Circ: Less than 30% calcific mid vessel stenosis  -RCA: Less than 50% proximal and mid calcific stenosis  Recommendations: Awaiting full CT FFR analysis. If hemodynamically significant lesions, she will need to undergo cath for PCI. If no hemodynamically significant stenosis>>> medical therapy + risk factor reduction. ASA 81 mg daily, statin for plaque stabilization and LDL goal of < 70 mg/dL. Control of BP <130/80. No  blocker due to bradycardia.   2. 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 Evaluated by Dr. Percival Spanish. Outpatient monitor in 2015 showed bradycardic episodes and occasional ventricular ectopy but no significant dysrhythmias. She had no significant symptoms with her bradycardia then and it was noted that she had chronotropic competence when she walked around the office . Given this, there was no indication for PPM at the time. EKGs/ tele this admit continue to show sinus bradycardia with rates in the 40s. She remains asymptomatic,  denying syncope/ near syncope. No indication for pacing therapy at this time. Continue to avoid AV nodal blocking agents.   3. HLD: lipid panel shows LDL at 85 mg/dL. Given CAD noted on chest CT, recommend statin therapy for aggressive LDL reduction and for plaque stabilization. Goal LDL < 70 mg/dL. She has been started on atorvastatin 40 mg. We will follow this in our office. She will need repeat FLP and HFTs in 6-8 weeks.     For questions or updates, please contact [place] Please consult [email or web address] for contact info under        Signed, Lyda Jester, PA-C  08/22/2018, 8:02 AM    Personally seen and examined. Agree with above.  FFR analysis from CT scan: RCA: Proximal .98 Mid .91  LAD: Proximal .94 Mid.91 very distal vessel .24  Circumflex:.97 mid .56  D1/IM: Very distal vessel .78  IMPRESSION: The proximal and mid vessels all have normal FFR CT. The abnormal values are in the very distal LAD and very distal IM/D1 would Rx medically  Jenkins Rouge  Excellent news.  Continue with aggressive medical management.  No beta-blocker secondary to underlying bradycardia.  Aspirin 81 mg.  Atorvastatin 40 mg for high intensity statin therapy.  Repeat lipid profile as outpatient.  On exam alert and oriented x3, regular rate and rhythm, lungs are clear abdomen soft no edema.  Discussed with primary team.  Okay for discharge.  [facility] will sign off.   Medication Recommendations: Atorvastatin 40, aspirin  81 Other recommendations (labs, testing, etc): No further cardiac testing, lipid panel in approximately 2 months with ALT Follow up as an outpatient: As previously scheduled  Candee Furbish, MD

## 2018-08-22 NOTE — Discharge Instructions (Signed)

## 2018-08-22 NOTE — Progress Notes (Addendum)
Bedside report received. No acute distress noted  4 p's addressed, monitoring ongoing

## 2018-08-25 NOTE — Progress Notes (Signed)
Cardiology Office Note   Date:  08/26/2018   ID:  Tracy Paul, DOB 1950/12/30, MRN [id number]  PCP:  [name], MD  Cardiologist:   Minus Breeding, MD   Chief Complaint  Patient presents with  . Coronary Artery Disease      History of Present Illness: Tracy Paul is a 67 y.o. female who presents for evaluation of chest pain.  She was in the hospital recently with chest pain.  She had a cardiac CTA with minimal coronary disease and distal vessel disease.    The patient denies any new symptoms such as chest discomfort, neck or arm discomfort. There has been no new shortness of breath, PND or orthopnea. There have been no reported palpitations, presyncope or syncope.   Of note they do mention that her heart rate occasionally runs low.  She has had some episodes where she feels slightly presyncopal for just a few seconds but she has not had any severe episodes that are prolonged or associated with frank syncope.  She was told her heart rate was low at times in the hospital.  Past Medical History:  Diagnosis Date  . Allergic rhinitis   . Arthritis   . Cholecystitis, chronic   . Dysrhythmia    Hx bradycardia - hx normal cardiac cath x2 / ECHO 2009 EF 60% LAE MYOVIEW 3/12 EF 60% NO ISCHEMIA  . Hyperlipidemia   . Hypertension    no treatment after 120 lb weight loss  . Hypothyroidism   . Prediabetes   . Thyroid nodule     Past Surgical History:  Procedure Laterality Date  . Bilateral carpal tunnel surgery    . BREATH TEK H PYLORI  07/01/2012   Procedure: BREATH TEK H PYLORI;  Surgeon: Edward Jolly, MD;  Location: [facility];  Service: General;  Laterality: N/A;  . CHOLECYSTECTOMY N/A 11/27/2013   Procedure: LAPAROSCOPIC CHOLECYSTECTOMY WITH INTRAOPERATIVE CHOLANGIOGRAM;  Surgeon: Pedro Earls, MD;  Location: [facility];  Service: General;  Laterality: N/A;  . GASTRIC ROUX-EN-Y N/A 12/16/2012   Procedure: LAPAROSCOPIC ROUX-EN-Y GASTRIC BYPASS WITH UPPER  ENDOSCOPY;  Surgeon: Pedro Earls, MD;  Location: [facility];  Service: General;  Laterality: N/A;  Gastric Bypass  . NASAL SINUS SURGERY    . REPLACEMENT TOTAL KNEE BILATERAL     bilat  . TUBAL LIGATION       Current Outpatient Medications  Medication Sig Dispense Refill  . alum & mag hydroxide-simeth (MAALOX/MYLANTA) 200-200-20 MG/5ML suspension Take 15 mLs by mouth every 6 (six) hours as needed for indigestion or heartburn. 355 mL 0  . amLODipine (NORVASC) 5 MG tablet Take 1 tablet (5 mg total) by mouth daily. 30 tablet 0  . Ascorbic Acid (VITAMIN C) 500 MG CAPS Take 1 tablet by mouth daily.     Marland Kitchen aspirin 81 MG tablet Take 81 mg by mouth daily.    Marland Kitchen atorvastatin (LIPITOR) 40 MG tablet Take 1 tablet (40 mg total) by mouth daily at 6 PM. 30 tablet 0  . Biotin 10 MG TABS Take 1 tablet (10 mg total) by mouth daily.    . Calcium Citrate-Vitamin D (CALCIUM CITRATE + D3 PO) Take 600 mg by mouth daily.     . diphenhydramine-acetaminophen (TYLENOL PM) 25-500 MG TABS tablet Take 1 tablet by mouth at bedtime.     . Ferrous Sulfate (SLOW RELEASE IRON PO) Take 1 tablet by mouth daily.     . fish oil-omega-3 fatty acids 1000 MG  capsule Take 1 g by mouth daily.    Marland Kitchen gabapentin (NEURONTIN) 100 MG capsule Take 1 capsule (100 mg total) by mouth 2 (two) times daily.    Marland Kitchen levothyroxine (SYNTHROID, LEVOTHROID) 75 MCG tablet Take 0.5-1 tablets (37.5-75 mcg total) by mouth See admin instructions. Take 0.5 tablet everyday except Monday and Thursday take 1 tablet.    . Magnesium 250 MG TABS Take 1 tablet by mouth daily.     . Melatonin 10 MG TABS Take 1 tablet by mouth at bedtime.    . meloxicam (MOBIC) 15 MG tablet Take 1 tablet (15 mg total) by mouth daily as needed for pain.    . nitroGLYCERIN (NITROSTAT) 0.4 MG SL tablet Place 1 tablet (0.4 mg total) under the tongue every 5 (five) minutes as needed for chest pain. 30 tablet 0  . ondansetron (ZOFRAN) 4 MG tablet Take 1 tablet (4 mg total) by mouth every 8  (eight) hours as needed for nausea or vomiting. 20 tablet 0  . pantoprazole (PROTONIX) 40 MG tablet Take 1 tablet (40 mg total) by mouth daily. 30 tablet 0  . potassium chloride (K-DUR) 10 MEQ tablet Take 1 tablet (10 mEq total) by mouth daily.    Marland Kitchen rOPINIRole (REQUIP) 0.5 MG tablet TAKE ONE-HALF (1/2) TABLET AT NIGHT. CAN INCREASE TO TAKE ONE TABLET, THEN MAY INCREASE UP TO 4 TABLETS AT NIGHT AS DIRECTED. (Patient taking differently: Take 0.5 mg by mouth at bedtime. ) 360 tablet 1  . vitamin B-12 (CYANOCOBALAMIN) 1000 MCG tablet Take 1,000 mcg by mouth daily.     . Vitamin D, Ergocalciferol, (DRISDOL) 50000 units CAPS capsule Take 1 capsule (50,000 Units total) by mouth every other day.    . fluticasone (FLONASE) 50 MCG/ACT nasal spray Place 2 sprays into both nostrils daily as needed for allergies. (Patient not taking: Reported on 08/26/2018)     No current facility-administered medications for this visit.     Allergies:   Codeine and Nabumetone    ROS:  Please see the history of present illness.   Otherwise, review of systems are positive for none.   All other systems are reviewed and negative.    PHYSICAL EXAM: VS:  BP 126/70   Pulse 72   Ht 5\' 3"  (1.6 m)   Wt 176 lb 12.8 oz (80.2 kg)   LMP  (LMP Unknown)   SpO2 97%   BMI 31.32 kg/m  , BMI Body mass index is 31.32 kg/m. GENERAL:  Well appearing HEENT:  Pupils equal round and reactive, fundi not visualized, oral mucosa unremarkable NECK:  No jugular venous distention, waveform within normal limits, carotid upstroke brisk and symmetric, no bruits, no thyromegaly LYMPHATICS:  No cervical, inguinal adenopathy LUNGS:  Clear to auscultation bilaterally BACK:  No CVA tenderness CHEST:  Unremarkable HEART:  PMI not displaced or sustained,S1 and S2 within normal limits, no S3, no S4, no clicks, no rubs, no murmurs ABD:  Flat, positive bowel sounds normal in frequency in pitch, no bruits, no rebound, no guarding, no midline pulsatile  mass, no hepatomegaly, no splenomegaly EXT:  2 plus pulses throughout, no edema, no cyanosis no clubbing SKIN:  No rashes no nodules NEURO:  Cranial nerves II through XII grossly intact, motor grossly intact throughout PSYCH:  Cognitively intact, oriented to person place and time    EKG:  EKG is not ordered today.   Recent Labs: 07/31/2018: TSH 2.67 08/22/2018: ALT 17; BUN 8; Creatinine, Ser 0.94; Hemoglobin 11.4; Magnesium 1.9; Platelets 234;  Potassium 3.9; Sodium 140    Lipid Panel    Component Value Date/Time   CHOL 156 08/21/2018 0519   TRIG 36 08/21/2018 0519   HDL 64 08/21/2018 0519   CHOLHDL 2.4 08/21/2018 0519   VLDL 7 08/21/2018 0519   LDLCALC 85 08/21/2018 0519   LDLCALC 81 07/31/2018 1322      Wt Readings from Last 3 Encounters:  08/26/18 176 lb 12.8 oz (80.2 kg)  08/22/18 176 lb 1.6 oz (79.9 kg)  08/20/18 181 lb (82.1 kg)      Other studies Reviewed: Additional studies/ records that were reviewed today include: Hospital records.  Reviewed CTA. Review of the above records demonstrates:  Please see elsewhere in the note.     ASSESSMENT AND PLAN:   CAD:   The patient has distal vessel small vessel disease and some nonobstructive proximal disease.  We talked at great length about risk reduction.  DYSLIPIDEMIA: She is on Lipitor now set of pravastatin.  This was changed in hospital.  She should get a lipid profile in 8 weeks.  BRADYCARDIA: She is had some vague infrequent very brief symptoms.  She will let me know if these increase in severity or frequency at which point she will probably need monitoring.   Current medicines are reviewed at length with the patient today.  The patient does not have concerns regarding medicines.  The following changes have been made:  no change  Labs/ tests ordered today include: None No orders of the defined types were placed in this encounter.    Disposition:   FU with as needed     Signed, Minus Breeding, MD    08/26/2018 5:39 PM    [facility]

## 2018-08-26 ENCOUNTER — Encounter: Payer: Self-pay | Admitting: Cardiology

## 2018-08-26 ENCOUNTER — Ambulatory Visit: Payer: Federal, State, Local not specified - PPO | Admitting: Cardiology

## 2018-08-26 VITALS — BP 126/70 | HR 72 | Ht 63.0 in | Wt 176.8 lb

## 2018-08-26 DIAGNOSIS — I2510 Atherosclerotic heart disease of native coronary artery without angina pectoris: Secondary | ICD-10-CM | POA: Diagnosis not present

## 2018-08-26 DIAGNOSIS — E785 Hyperlipidemia, unspecified: Secondary | ICD-10-CM | POA: Diagnosis not present

## 2018-08-26 DIAGNOSIS — R001 Bradycardia, unspecified: Secondary | ICD-10-CM | POA: Diagnosis not present

## 2018-08-26 NOTE — Patient Instructions (Signed)

## 2018-08-29 ENCOUNTER — Telehealth: Payer: Self-pay | Admitting: *Deleted

## 2018-08-29 NOTE — Telephone Encounter (Signed)
Called patient on 08/29/2018 , 9:04 AM in an attempt to reach the patient for a hospital follow up.   Admit date: 08/20/18 Discharge: 08/22/18   She does not  have any questions or concerns about medications from the hospital admission. The patient's medications were reviewed over the phone, they were counseled to bring in all current medications to the hospital follow up visit.   I advised the patient to call if any questions or concerns arise about the hospital admission or medications    Home health was not  started in the hospital.  All questions were answered and a follow up appointment was made.   Prior to Admission medications   Medication Sig Start Date End Date Taking? Authorizing Provider  alum & mag hydroxide-simeth (MAALOX/MYLANTA) 200-200-20 MG/5ML suspension Take 15 mLs by mouth every 6 (six) hours as needed for indigestion or heartburn. 08/22/18   Aline August, MD  amLODipine (NORVASC) 5 MG tablet Take 1 tablet (5 mg total) by mouth daily. 08/23/18   Aline August, MD  Ascorbic Acid (VITAMIN C) 500 MG CAPS Take 1 tablet by mouth daily.     [provider]  aspirin 81 MG tablet Take 81 mg by mouth daily.    [provider]  atorvastatin (LIPITOR) 40 MG tablet Take 1 tablet (40 mg total) by mouth daily at 6 PM. 08/22/18   Aline August, MD  Biotin 10 MG TABS Take 1 tablet (10 mg total) by mouth daily. 08/22/18   Aline August, MD  Calcium Citrate-Vitamin D (CALCIUM CITRATE + D3 PO) Take 600 mg by mouth daily.     [provider]  diphenhydramine-acetaminophen (TYLENOL PM) 25-500 MG TABS tablet Take 1 tablet by mouth at bedtime.     [provider]  Ferrous Sulfate (SLOW RELEASE IRON PO) Take 1 tablet by mouth daily.     [provider]  fish oil-omega-3 fatty acids 1000 MG capsule Take 1 g by mouth daily.    [provider]  fluticasone (FLONASE) 50 MCG/ACT nasal spray Place 2 sprays into both nostrils daily as needed for  allergies. Patient not taking: Reported on 08/26/2018 08/22/18   Aline August, MD  gabapentin (NEURONTIN) 100 MG capsule Take 1 capsule (100 mg total) by mouth 2 (two) times daily. 08/22/18   Aline August, MD  levothyroxine (SYNTHROID, LEVOTHROID) 75 MCG tablet Take 0.5-1 tablets (37.5-75 mcg total) by mouth See admin instructions. Take 0.5 tablet everyday except Monday and Thursday take 1 tablet. 08/22/18   Aline August, MD  Magnesium 250 MG TABS Take 1 tablet by mouth daily.     [provider]  Melatonin 10 MG TABS Take 1 tablet by mouth at bedtime.    [provider]  meloxicam (MOBIC) 15 MG tablet Take 1 tablet (15 mg total) by mouth daily as needed for pain. 08/22/18   Aline August, MD  nitroGLYCERIN (NITROSTAT) 0.4 MG SL tablet Place 1 tablet (0.4 mg total) under the tongue every 5 (five) minutes as needed for chest pain. 08/22/18   Aline August, MD  ondansetron (ZOFRAN) 4 MG tablet Take 1 tablet (4 mg total) by mouth every 8 (eight) hours as needed for nausea or vomiting. 08/22/18 08/22/19  Aline August, MD  pantoprazole (PROTONIX) 40 MG tablet Take 1 tablet (40 mg total) by mouth daily. 08/23/18   Aline August, MD  potassium chloride (K-DUR) 10 MEQ tablet Take 1 tablet (10 mEq total) by mouth daily. 08/22/18   Aline August, MD  rOPINIRole (REQUIP) 0.5 MG tablet TAKE ONE-HALF (1/2) TABLET AT NIGHT. CAN INCREASE TO TAKE ONE TABLET, THEN MAY INCREASE UP TO 4 TABLETS AT NIGHT AS DIRECTED. Patient taking differently: Take 0.5 mg by mouth at bedtime.  02/05/18   Vicie Mutters, PA-C  vitamin B-12 (CYANOCOBALAMIN) 1000 MCG tablet Take 1,000 mcg by mouth daily.     [provider]  Vitamin D, Ergocalciferol, (DRISDOL) 50000 units CAPS capsule Take 1 capsule (50,000 Units total) by mouth every other day. 08/22/18   Aline August, MD

## 2018-08-29 NOTE — Telephone Encounter (Signed)
Called patient on 08/29/2018 , 8:56 AM in an attempt to reach the patient for a hospital follow up.  Admit date: 08/20/18 Discharge date: 08/22/18.   LMOM TO CALL & SCHEDULE

## 2018-08-31 NOTE — Telephone Encounter (Signed)
Patient is scheduled a follow up appointment, knows to call with any questions.

## 2018-09-02 NOTE — Progress Notes (Signed)
Hospital follow up  Assessment and Plan: Hospital visit follow up for: chest pain Hospital discharge meds were reviewed, and reconciled with the patient.   Tracy Paul was seen today for hospitalization follow-up.  Diagnoses and all orders for this visit:  Coronary artery disease involving native coronary artery of native heart without angina pectoris Lifestyle discussed at length today Control blood pressure, cholesterol, glucose, increase exercise, weight loss  Continue follow up with Dr. Percival Spanish as recommended  Chest pain, unspecified type Resolved  Essential hypertension Continue medication Monitor blood pressure at home; call if consistently over 130/80 Continue DASH diet.   Reminder to go to the ER if any CP, SOB, nausea, dizziness, severe HA, changes vision/speech, left arm numbness and tingling and jaw pain. -     amLODipine (NORVASC) 5 MG tablet; Take 1 tablet (5 mg total) by mouth daily.  Hyperlipidemia LDL goal <70 Continue low cholesterol diet and exercise.  Check lipid panel.  -     atorvastatin (LIPITOR) 40 MG tablet; Take 1 tablet (40 mg total) by mouth daily at 6 PM.  Bradycardia Avoid AV nodal blocking agents, monitor  Iron deficiency anemia, unspecified iron deficiency anemia type -     CBC with Differential/Platelet -     Iron,Total/Total Iron Binding Cap -     Ferritin -     POC Hemoccult Bld/Stl (3-Cd Home Screen); Future  Medication management -     CBC with Differential/Platelet -     COMPLETE METABOLIC PANEL WITH GFR  Acute pharyngitis, unspecified etiology Suggested symptomatic OTC remedies. Nasal saline spray for congestion. Nasal steroids, allergy pill, oral steroids offered Follow up as needed. -     predniSONE (DELTASONE) 20 MG tablet; 2 tablets daily for 3 days, 1 tablet daily for 4 days. -     azithromycin (ZITHROMAX) 250 MG tablet; Take 2 tablets (500 mg) on  Day 1,  followed by 1 tablet (250 mg) once daily on Days 2 through 5.  R ear  cerumen impaction/pain - stop using Qtips, irrigation used in the office without complications, use OTC drops/oil at home to prevent reoccurence   Medications Discontinued During This Encounter  Medication Reason  . atorvastatin (LIPITOR) 40 MG tablet   . amLODipine (NORVASC) 5 MG tablet Reorder    Over 40 minutes of exam, counseling, chart review, and complex, high/moderate level critical decision making was performed this visit.   Future Appointments  Date Time Provider Tracy Paul  11/07/2018 10:15 AM [name], NP [facility] None  02/13/2019 11:00 AM [name], MD [facility] None  08/25/2019 10:00 AM [name], MD [facility] None     HPI BP 138/78   Pulse (!) 58   Temp (!) 97.5 F (36.4 C)   Ht 5\' 3"  (1.6 m)   Wt 180 lb 3.2 oz (81.7 kg)   LMP  (LMP Unknown)   SpO2 99%   BMI 31.92 kg/m   67 y.o.female presents for follow up for transition from recent hospitalization. Admit date to the hospital was 08/20/18, patient was discharged from the hospital on 08/22/18 and our clinical staff contacted the office the day after discharge to set up a follow up appointment. The discharge summary, medications, and diagnostic test results were reviewed before meeting with the patient. The patient was admitted for: chest pain with dyspnea.   The patient is a 67 year old female with history of hypertension, hyperlipidemia, prediabetes, hypothyroidism, depression, iron deficiency anemia, gastric ulcer disease, bradycardia, gastric Roux-en-Y bypass who presented to the ED  with  chest pain and shortness of breath.  Patient stated that her chest pain started at about 8:30 PM, pressure-like, radiating to the upper mid back between shoulder blades.  It was associated with shortness of breath, no cough or fever or chills.  While in the ED the pt was found to have negative troponin, WBC 10.5, electrolytes/renal function unremarkable, temperature normal, heart rate 49, RR 21, oxygen  saturation 98% on room air, negative chest x-ray. CT angiogram of chest/abdomen/pelvis was negative for aortic dissection, but showed evidence suggestive of elevated right heart pressure. Patient is placed on telemetry bed for observation. Cardiology was consulted and ordered coronary CTA to exclude any evidence of high risk CAD which showed nonobstructive CAD in RCA/LAD.  FFR analysis from CT scan showed proximal and mid vessels all have normal FFR CT.  The abnormal values are in the very distal LAD and very distal but IM/D1: Cardiology recommended medical management with 81 mg aspirin and high-intensity statin atorvastatin 40 mg daily (previously on pravastatin). Cardiology initiated amlodipine, and cleared the patient for discharge with recommendation for outpatient cardiology management.   Bradycardia was noted to be stable from previous, with no indication for pacing at this time per cardiology; will continue to avoid AV nodal blocking agents.   Today she presents doing well, no further chest pain, denies dyspnea, palpitations, edema, fatigue. She does endorse 10 days of nasal congestion, sore throat, sinus pressure/tenderness and ear pressure, some R ear pain. Denies fever/chills, headaches, diaphoresis.   She has followed up with Dr. Percival Spanish on 10/29, discussed risk management and ongoing monitoring. She practices portion control, but otherwise admits she eats "whatever I want." She keeps busy watching grandchildren and working around the house, doesn't sit a lot, but doesn't intentionally exercise. She drinks a lot of sugar-sweetened tea but otherwise no soda or caffeine.   BMI is Body mass index is 31.92 kg/m. Wt Readings from Last 3 Encounters:  09/03/18 180 lb 3.2 oz (81.7 kg)  08/26/18 176 lb 12.8 oz (80.2 kg)  08/22/18 176 lb 1.6 oz (79.9 kg)    Her blood pressure has been controlled at home, 130s/80s, today their BP is BP: 138/78  The cholesterol last visit was:   Lab Results   Component Value Date   CHOL 156 08/21/2018   HDL 64 08/21/2018   LDLCALC 85 08/21/2018   TRIG 36 08/21/2018   CHOLHDL 2.4 08/21/2018     Component     Latest Ref Rng & Units 07/31/2018 08/20/2018 08/22/2018  WBC     4.0 - 10.5 K/uL 5.1 10.5 7.6  RBC     3.87 - 5.11 MIL/uL 3.85 4.06 3.78 (L)  Hemoglobin     12.0 - 15.0 g/dL 11.9 11.9 (L) 11.4 (L)  HCT     36.0 - 46.0 % 35.5 39.5 36.0  MCV     80.0 - 100.0 fL 92.2 97.3 95.2  MCH     26.0 - 34.0 pg 30.9 29.3 30.2  MCHC     30.0 - 36.0 g/dL 33.5 30.1 31.7  RDW     11.5 - 15.5 % 12.3 13.4 13.2  Platelets     150 - 400 K/uL 275 290 234  nRBC     0.0 - 0.2 %  0.0 0.0  Neutrophils     % 34.7  50  NEUT#     1.7 - 7.7 K/uL 1,770  3.9  Lymphocytes     %   35  Lymphocyte #  0.7 - 4.0 K/uL 2,504  2.6  Monocytes Relative     % 8.9  10  Monocyte #     0.1 - 1.0 K/uL   0.8  Eosinophil     % 5.9  4  Eosinophils Absolute     0.0 - 0.5 K/uL 301  0.3  Basophil     % 1.4  1  Basophils Absolute     0.0 - 0.1 K/uL 71  0.0  Immature Granulocytes     %   0  Abs Immature Granulocytes     0.00 - 0.07 K/uL   0.01  MPV     7.5 - 12.5 fL 11.4    WBC mixed population     200 - 950 cells/uL 454    Total Lymphocyte     % 49.1      Home health is not involved.   Images while in the hospital: Dg Chest 2 View  Result Date: 08/20/2018 CLINICAL DATA:  Chest pain EXAM: CHEST - 2 VIEW COMPARISON:  12/20/2015 FINDINGS: The heart size and mediastinal contours are within normal limits. Both lungs are clear. The visualized skeletal structures are unremarkable. IMPRESSION: No active cardiopulmonary disease. Electronically Signed   By: Donavan Foil M.D.   On: 08/20/2018 21:56   Ct Coronary Morph W/cta Cor W/score W/ca W/cm &/or Wo/cm  Addendum Date: 08/21/2018   ADDENDUM REPORT: 08/21/2018 16:58 CLINICAL DATA:  Chest pain EXAM: Cardiac CTA MEDICATIONS: Sub lingual nitro. 4mg  and lopressor 0mg  TECHNIQUE: The patient was scanned on a  Siemens 671 slice scanner. Gantry rotation speed was 270 msecs. Collimation was .28mm. A 100 kV prospective scan was triggered in the descending thoracic aorta at 111 HU's with 5% padding centered around 78% of the R-R interval. Average HR during the scan was 49 bpm. The 3D data set was interpreted on a dedicated work station using MPR, MIP and VRT modes. A total of 80cc of contrast was used. FINDINGS: Non-cardiac: See separate report from [facility]. No significant findings on limited lung and soft tissue windows. Calcium Score: Calcium noted in all 3 coronary vessels Coronary Arteries: Right dominant with no anomalies LM: Normal LAD: Ostial/proximal LAD less than 50% calcific stenosis Less than 30% calcific mid vessel stenosis D1: Normal D2: Normal Circumflex: Less than 30% calcific mid vessel stenosis OM1: Normal OM2: Normal RCA: Less than 50% proximal and mid calcific stenosis PDA: Normal PLA: Normal IMPRESSION: 1. 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 Calcium score 172 noted in all 3 vessels which is 29 th percentile for age and sex [place] Electronically Signed   By: Jenkins Rouge M.D.   On: 08/21/2018 16:58   Result Date: 08/21/2018 EXAM: OVER-READ INTERPRETATION  CT CHEST The following report is an over-read performed by radiologist Dr. Abigail Miyamoto of [facility], [place] on 08/21/2018. This over-read does not include interpretation of cardiac or coronary anatomy or pathology. The coronary CTA interpretation by the cardiologist is attached. COMPARISON:  Diagnostic CT of earlier today FINDINGS: Vascular: Normal aortic contour, without dissection. Aortic atherosclerosis. No central pulmonary embolism, on this non-dedicated study. Mediastinum/Nodes: No imaged thoracic adenopathy. Lungs/Pleura: No imaged pleural fluid. Bibasilar atelectasis. Upper Abdomen: Normal imaged portions of the liver, spleen. Surgical changes about the stomach. Musculoskeletal: No  acute osseous abnormality. IMPRESSION: 1.  No acute findings in the imaged extracardiac chest. 2.  Aortic Atherosclerosis (ICD10-I70.0). Electronically Signed: By: Abigail Miyamoto M.D. On: 08/21/2018  16:28   Ct Coronary Fractional Flow Reserve Data Prep  Result Date: 08/22/2018 CLINICAL DATA:  CAD EXAM: FFR CT MEDICATIONS: None TECHNIQUE: The best systolic and diastolic phases of the patients gated cardiac CTA sent to [facility] for hemodynamic analysis FINDINGS: RCA: Proximal .98 Mid .91 LAD: Proximal .94 Mid.91 very distal vessel .62 Circumflex:.97 mid .11 D1/IM: Very distal vessel .78 IMPRESSION: The proximal and mid vessels all have normal FFR CT. The abnormal values are in the very distal LAD and very distal IM/D1 would Rx medically [place] Electronically Signed   By: Jenkins Rouge M.D.   On: 08/22/2018 09:36   Ct Angio Chest/abd/pel For Dissection W And/or W/wo  Result Date: 08/21/2018 CLINICAL DATA:  Chest pain radiating to the back. Clinical concern for dissection EXAM: CT ANGIOGRAPHY CHEST, ABDOMEN AND PELVIS TECHNIQUE: Multidetector CT imaging through the chest, abdomen and pelvis was performed using the standard protocol during bolus administration of intravenous contrast. Multiplanar reconstructed images and MIPs were obtained and reviewed to evaluate the vascular anatomy. CONTRAST:  149mL ISOVUE-370 IOPAMIDOL (ISOVUE-370) INJECTION 76% COMPARISON:  Abdominal CT 08/12/2018.  Chest radiographs yesterday. FINDINGS: CTA CHEST FINDINGS Cardiovascular: Normal caliber thoracic aorta with atherosclerosis. No dissection, aneurysm, aortic hematoma or acute aortic syndrome. Common origin of the brachiocephalic and left common carotid from the aortic arch, normal variant anatomy. No filling defects in the central pulmonary arteries to suggest pulmonary embolus. Borderline cardiomegaly. Contrast refluxes into the hepatic veins and IVC. No pericardial effusion. There are scattered coronary artery  calcifications. Mediastinum/Nodes: No enlarged mediastinal, hilar, or axillary lymph nodes. The esophagus is decompressed. Hypodense right thyroid nodule measures 2.3 cm in unchanged from 2014 CT. Lungs/Pleura: Minor scattered atelectasis. No confluent consolidation. No pulmonary edema or pleural fluid. No pulmonary mass. Musculoskeletal: Scoliosis and degenerative change in the spine. There are no acute or suspicious osseous abnormalities. Review of the MIP images confirms the above findings. CTA ABDOMEN AND PELVIS FINDINGS VASCULAR Aorta: Moderate atherosclerosis without aneurysm, dissection, significant stenosis or vasculitis. Celiac: Patent without dissection or significant stenosis. SMA: Patent without dissection or significant stenosis. Renals: 2 left and single right renal arteries. All renal arteries are patent. No stenosis or dissection. IMA: Patent. Inflow: Patent with mild atherosclerosis. No dissection or stenosis. Veins: No obvious venous abnormality within the limitations of this arterial phase study. Review of the MIP images confirms the above findings. NON-VASCULAR Hepatobiliary: A hypodense lesion at the right hepatic dome, unchanged from prior. No new hepatic abnormality. Clips in the gallbladder fossa postcholecystectomy. No biliary dilatation. Pancreas: No ductal dilatation or inflammation. Spleen: Normal in size and arterial phase enhancement. Adrenals/Urinary Tract: Normal adrenal glands. No hydronephrosis or perinephric edema. Homogeneous renal enhancement. Urinary bladder is physiologically distended without wall thickening. Stomach/Bowel: Post gastric bypass. Detailed bowel evaluation better assessed on prior contrast-enhanced exam. No obstruction, wall thickening, or inflammation. There is colonic tortuosity. Mild colonic diverticulosis without diverticulitis. Normal appendix. Lymphatic: No adenopathy. Reproductive: Uterus and bilateral adnexa are unremarkable. Other: No free air or free  fluid. Musculoskeletal: Scoliosis and degenerative change in the spine. There are no acute or suspicious osseous abnormalities. Review of the MIP images confirms the above findings. IMPRESSION: 1. Thoracoabdominal aortic atherosclerosis without dissection or acute aortic abnormality. 2. Mild contrast refluxing into the hepatic veins and IVC suggest elevated right heart pressures. Borderline cardiomegaly. 3. Unchanged 2.3 cm right thyroid nodule dating back 2014. Consider elective thyroid ultrasound for characterization on an outpatient basis. 4. 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 Colonic diverticulosis without  diverticulitis. Aortic Atherosclerosis (ICD10-I70.0). Electronically Signed   By: Keith Rake M.D.   On: 08/21/2018 04:41    Past Medical History:  Diagnosis Date  . Allergic rhinitis   . Arthritis   . Cholecystitis, chronic   . Dysrhythmia    Hx bradycardia - hx normal cardiac cath x2 / ECHO 2009 EF 60% LAE MYOVIEW 3/12 EF 60% NO ISCHEMIA  . Hyperlipidemia   . Hypertension    no treatment after 120 lb weight loss  . Hypothyroidism   . Prediabetes   . Thyroid nodule      Allergies  Allergen Reactions  . Codeine Other (See Comments)    "puts me out of it".  Dysphoria.  Patient tolerates Tramadol without complications.  . Nabumetone     GI upset      Current Outpatient Medications on File Prior to Visit  Medication Sig Dispense Refill  . alum & mag hydroxide-simeth (MAALOX/MYLANTA) 200-200-20 MG/5ML suspension Take 15 mLs by mouth every 6 (six) hours as needed for indigestion or heartburn. 355 mL 0  . Ascorbic Acid (VITAMIN C) 500 MG CAPS Take 1 tablet by mouth daily.     Marland Kitchen aspirin 81 MG tablet Take 81 mg by mouth daily.    . Biotin 10 MG TABS Take 1 tablet (10 mg total) by mouth daily.    . Calcium Citrate-Vitamin D (CALCIUM CITRATE + D3 PO) Take 600 mg by mouth daily.     . diphenhydramine-acetaminophen (TYLENOL PM) 25-500 MG TABS tablet Take 1 tablet by  mouth at bedtime.     . Ferrous Sulfate (SLOW RELEASE IRON PO) Take 1 tablet by mouth daily.     . fish oil-omega-3 fatty acids 1000 MG capsule Take 1 g by mouth daily.    . fluticasone (FLONASE) 50 MCG/ACT nasal spray Place 2 sprays into both nostrils daily as needed for allergies.    Marland Kitchen gabapentin (NEURONTIN) 100 MG capsule Take 1 capsule (100 mg total) by mouth 2 (two) times daily.    Marland Kitchen levothyroxine (SYNTHROID, LEVOTHROID) 75 MCG tablet Take 0.5-1 tablets (37.5-75 mcg total) by mouth See admin instructions. Take 0.5 tablet everyday except Monday and Thursday take 1 tablet.    . Magnesium 250 MG TABS Take 1 tablet by mouth daily.     . Melatonin 10 MG TABS Take 1 tablet by mouth at bedtime.    . meloxicam (MOBIC) 15 MG tablet Take 1 tablet (15 mg total) by mouth daily as needed for pain.    . nitroGLYCERIN (NITROSTAT) 0.4 MG SL tablet Place 1 tablet (0.4 mg total) under the tongue every 5 (five) minutes as needed for chest pain. 30 tablet 0  . ondansetron (ZOFRAN) 4 MG tablet Take 1 tablet (4 mg total) by mouth every 8 (eight) hours as needed for nausea or vomiting. 20 tablet 0  . pantoprazole (PROTONIX) 40 MG tablet Take 1 tablet (40 mg total) by mouth daily. 30 tablet 0  . potassium chloride (K-DUR) 10 MEQ tablet Take 1 tablet (10 mEq total) by mouth daily.    Marland Kitchen rOPINIRole (REQUIP) 0.5 MG tablet TAKE ONE-HALF (1/2) TABLET AT NIGHT. CAN INCREASE TO TAKE ONE TABLET, THEN MAY INCREASE UP TO 4 TABLETS AT NIGHT AS DIRECTED. (Patient taking differently: Take 0.5 mg by mouth at bedtime. ) 360 tablet 1  . vitamin B-12 (CYANOCOBALAMIN) 1000 MCG tablet Take 1,000 mcg by mouth daily.     . Vitamin D, Ergocalciferol, (DRISDOL) 50000 units CAPS capsule Take 1 capsule (50,000  Units total) by mouth every other day.     No current facility-administered medications on file prior to visit.     ROS: Review of Systems  Constitutional: Negative for malaise/fatigue and weight loss.  HENT: Positive for  congestion, ear pain (Right), sinus pain and sore throat. Negative for ear discharge, hearing loss and tinnitus.   Eyes: Negative for blurred vision and double vision.  Respiratory: Negative for cough, sputum production, shortness of breath and wheezing.   Cardiovascular: Negative for chest pain, palpitations, orthopnea, claudication and leg swelling.  Gastrointestinal: Negative for abdominal pain, blood in stool, constipation, diarrhea, heartburn, melena, nausea and vomiting.  Genitourinary: Negative.   Musculoskeletal: Negative for joint pain and myalgias.  Skin: Negative for rash.  Neurological: Negative for dizziness, tingling, sensory change, weakness and headaches.  Endo/Heme/Allergies: Negative for polydipsia.  Psychiatric/Behavioral: Negative.   All other systems reviewed and are negative.   Physical Exam: Filed Weights   09/03/18 1108  Weight: 180 lb 3.2 oz (81.7 kg)   BP 138/78   Pulse (!) 58   Temp (!) 97.5 F (36.4 C)   Ht 5\' 3"  (1.6 m)   Wt 180 lb 3.2 oz (81.7 kg)   LMP  (LMP Unknown)   SpO2 99%   BMI 31.92 kg/m    General Appearance: Well nourished, in no apparent distress. Eyes: PERRLA, EOMs, conjunctiva no swelling or erythema Sinuses: No Frontal/maxillary tenderness ENT/Mouth: L Ext aud canals clear, R ext aud canal obstructed by dry wax; L TM without erythema, bulging. Post pharynx with mild erythema, without swelling, or exudate.  Tonsils not swollen or erythematous. Hearing normal.  Neck: Supple, thyroid normal.  Respiratory: Respiratory effort normal, BS equal bilaterally without rales, rhonchi, wheezing or stridor.  Cardio: RRR with no MRGs. Brisk peripheral pulses without edema.  Abdomen: Soft, + BS.  Non tender, no guarding, rebound, hernias, masses. Lymphatics: Non tender without lymphadenopathy.  Musculoskeletal: Full ROM, Symmetrical strength, normal gait.  Skin: Warm, dry without rashes, lesions, ecchymosis.  Neuro: Normal muscle tone, no  cerebellar symptoms.  Psych: Awake and oriented X 3, normal affect, Insight and Judgment appropriate.     Izora Ribas, NP 11:52 AM [facility]

## 2018-09-03 ENCOUNTER — Encounter: Payer: Self-pay | Admitting: Adult Health

## 2018-09-03 ENCOUNTER — Ambulatory Visit: Payer: Federal, State, Local not specified - PPO | Admitting: Adult Health

## 2018-09-03 VITALS — BP 138/78 | HR 58 | Temp 97.5°F | Ht 63.0 in | Wt 180.2 lb

## 2018-09-03 DIAGNOSIS — D509 Iron deficiency anemia, unspecified: Secondary | ICD-10-CM | POA: Diagnosis not present

## 2018-09-03 DIAGNOSIS — I2510 Atherosclerotic heart disease of native coronary artery without angina pectoris: Secondary | ICD-10-CM | POA: Diagnosis not present

## 2018-09-03 DIAGNOSIS — R079 Chest pain, unspecified: Secondary | ICD-10-CM | POA: Diagnosis not present

## 2018-09-03 DIAGNOSIS — H9201 Otalgia, right ear: Secondary | ICD-10-CM

## 2018-09-03 DIAGNOSIS — J029 Acute pharyngitis, unspecified: Secondary | ICD-10-CM

## 2018-09-03 DIAGNOSIS — R001 Bradycardia, unspecified: Secondary | ICD-10-CM

## 2018-09-03 DIAGNOSIS — Z79899 Other long term (current) drug therapy: Secondary | ICD-10-CM

## 2018-09-03 DIAGNOSIS — E785 Hyperlipidemia, unspecified: Secondary | ICD-10-CM

## 2018-09-03 DIAGNOSIS — H6121 Impacted cerumen, right ear: Secondary | ICD-10-CM | POA: Diagnosis not present

## 2018-09-03 DIAGNOSIS — I10 Essential (primary) hypertension: Secondary | ICD-10-CM | POA: Diagnosis not present

## 2018-09-03 MED ORDER — ATORVASTATIN CALCIUM 40 MG PO TABS: 40.0000 mg | ORAL_TABLET | Freq: Every day | ORAL | 1 refills | Status: DC

## 2018-09-03 MED ORDER — AMLODIPINE BESYLATE 5 MG PO TABS: 5.0000 mg | ORAL_TABLET | Freq: Every day | ORAL | 1 refills | Status: DC

## 2018-09-03 MED ORDER — AZITHROMYCIN 250 MG PO TABS: ORAL_TABLET | ORAL | 1 refills | Status: AC

## 2018-09-03 MED ORDER — PREDNISONE 20 MG PO TABS: ORAL_TABLET | ORAL | 0 refills | Status: DC

## 2018-09-03 NOTE — Patient Instructions (Addendum)
Goals    . Blood Pressure < 130/80    . DIET - REDUCE SUGAR INTAKE    . Exercise 150 min/wk Moderate Activity     Aim for at least 15 min of brisk walking daily     . LDL CALC < 70    . Weight (lb) < 160 lb (72.6 kg)      Recommend cutting down on sugar intake, focus on increasing plant-based whole foods (vegetables, fruit, nuts, beans, seeds, etc) and limit processed foods and animal products, getting in regular exercise, lose some weight, and maintain a low-stress lifestyle.    - the American Heart Association recommends no more than 9 teaspoons (38 g) of added sugar for men daily, and 6 teaspoons (25 g) for women. Added sugar can be in many things that you might not expect - salad dressings, bread that is not home made, "all natural" fruit juice, etc., most processed foods contain hidden sugars. Consider looking at labels and being aware of how much sugar you are consuming in a day. Less is always better for sugar; sugar reduces your body's immune response, and damages blood vessels, leading to increased risk of many diseases.    Coronary Artery Disease, Female Coronary artery disease (CAD) is a condition in which the arteries that lead to the heart (coronary arteries) become narrow or blocked. The narrowing or blockage can lead to decreased blood flow to the heart. Prolonged reduced blood flow can cause a heart attack (myocardial infarction or MI). This condition may also be called coronary heart disease. Because CAD is the leading cause of death in women, it is important to understand what causes this condition and how it is treated. What are the causes? CAD is most often caused by atherosclerosis. This is the buildup of fat and cholesterol (plaque) on the inside of the arteries. Over time, the plaque may narrow or block the artery, reducing blood flow to the heart. Plaque can also become weak and break off within a coronary artery and cause a sudden blockage. Other less common causes of  CAD include:  An embolism or blood clot in a coronary artery.  A tearing of the artery (spontaneous coronary artery dissection).  An aneurysm.  Inflammation (vasculitis) in the artery wall.  What increases the risk? The following factors may make you more likely to develop this condition:  Age. Women over age 90 are at a greater risk of CAD.  Family history of CAD.  High blood pressure (hypertension).  Diabetes.  High cholesterol levels.  Tobacco use.  Lack of exercise.  Menopause. ? All postmenopausal women are at greater risk of CAD. ? Women who have experienced menopause between the ages of 77-45 (early menopause) are at a higher risk of CAD. ? Women who have experienced menopause before age 27 (premature menopause) are at a very high risk of CAD.  Excessive alcohol use  A diet high in saturated and trans fats, such as fried food and processed meat.  Other possible risk factors include:  High stress levels.  Depression  Obesity.  Sleep apnea.  What are the signs or symptoms? Many people do not have any symptoms during the early stages of CAD. As the condition progresses, symptoms may include:  Chest pain (angina). The pain can: ? Feel like crushing or squeezing, or a tightness, pressure, fullness, or heaviness in the chest. ? Last more than a few minutes or can stop and recur. The pain tends to get worse with exercise  or stress and to fade with rest.  Pain in the arms, neck, jaw, or back.  Unexplained heartburn or indigestion.  Shortness of breath.  Nausea.  Sudden cold sweats.  Sudden light-headedness.  Fluttering or fast heartbeat (palpitations).  Many women have chest discomfort and the other symptoms. However, women often have unusual (atypical) symptoms, such as:  Fatigue.  Vomiting.  Unexplained feelings of nervousness or anxiety.  Unexplained weakness.  Dizziness or fainting.  How is this diagnosed? This condition is diagnosed  based on:  Your family and medical history.  A physical exam.  Tests, including: ? A test to check the electrical signals in your heart (electrocardiogram). ? Exercise stress test. This looks for signs of blockage when the heart is stressed with exercise, such as running on a treadmill. ? Pharmacologic stress test. This test looks for signs of blockage when the heart is being stressed with a medicine. ? Blood tests. ? Coronary angiogram. This is a procedure to look at the coronary arteries to see if there is any blockage. During this test, a dye is injected into your arteries so they appear on an X-ray. ? A test that uses sound waves to take a picture of your heart (echocardiogram). ? Chest X-ray.  How is this treated? This condition may be treated by:  Healthy lifestyle changes to reduce risk factors.  Medicines such as: ? Antiplatelet medicines and blood-thinning medicines, such as aspirin. These help prevent blood clots. ? Nitroglycerin. ? Blood pressure medicines. ? Cholesterol-lowering medicine.  Coronary angioplasty and stenting. During this procedure, a thin, flexible tube is inserted through a blood vessel and into a blocked artery. A balloon or similar device on the end of the tube is inflated to open up the artery. In some cases, a small, mesh tube (stent) is inserted into the artery to keep it open.  Coronary artery bypass surgery. During this surgery, veins or arteries from other parts of the body are used to create a bypass around the blockage and allow blood to reach your heart.  Follow these instructions at home: Medicines  Take over-the-counter and prescription medicines only as told by your health care provider.  Do not take the following medicines unless your health care provider approves: ? NSAIDs, such as ibuprofen, naproxen, or celecoxib. ? Vitamin supplements that contain vitamin A, vitamin E, or both. ? Hormone replacement therapy that contains estrogen  with or without progestin. Lifestyle  Follow an exercise program approved by your health care provider. Aim for 150 minutes of moderate exercise or 75 minutes of vigorous exercise each week.  Maintain a healthy weight or lose weight as approved by your health care provider.  Rest when you are tired.  Learn to manage stress or try to limit your stress. Ask your health care provider for suggestions if you need help.  Get screened for depression and seek treatment, if needed.  Do not use any products that contain nicotine or tobacco, such as cigarettes and e-cigarettes. If you need help quitting, ask your health care provider.  Do not use illegal drugs. Eating and drinking  Follow a heart-healthy diet. A dietitian can help educate you about healthy food options and changes. In general, eat plenty of fruits and vegetables, lean meats, and whole grains.  Avoid foods high in: ? Sugar. ? Salt (sodium). ? Saturated fats, such as processed or fatty meat. ? Trans fats, such as fried food.  Use healthy cooking methods such as roasting, grilling, broiling, baking, poaching, steaming,  or stir-frying.  If you drink alcohol, and your health care provider approves, limit your alcohol intake to no more than 1 drink per day. One drink equals 12 ounces of beer, 5 ounces of wine, or 1 ounces of hard liquor. General instructions  Manage any other health conditions, such as hypertension and diabetes. These conditions affect your heart.  Your health care provider may ask you to monitor your blood pressure. Ideally, your blood pressure should be below 130/80.  Keep all follow-up visits as told by your health care provider. This is important. Get help right away if:  You have pain in your chest, neck, arm, jaw, stomach, or back that: ? Lasts more than a few minutes. ? Is recurring. ? Is not relieved by taking medicine under your tongue (sublingualnitroglycerin).  You have profuse sweating without  cause.  You have unexplained: ? Heartburn or indigestion. ? Shortness of breath or difficulty breathing. ? Fluttering or fast heartbeat (palpitations). ? Nausea or vomiting. ? Fatigue. ? Feelings of nervousness or anxiety. ? Weakness. ? Diarrhea.  You have sudden light-headedness or dizziness.  You faint.  You feel like hurting yourself or think about taking your own life. These symptoms may represent a serious problem that is an emergency. Do not wait to see if the symptoms will go away. Get medical help right away. Call your local emergency services (911 in the [place] Do not drive yourself to the hospital. Summary  Coronary artery disease (CAD) is a process in which the arteries that lead to the heart (coronary arteries) become narrow or blocked. The narrowing or blockage can lead to a heart attack.  Many women have chest discomfort and other common symptoms of CAD. However, women often have different (atypical) symptoms, such as fatigue, vomiting, and dizziness or weakness.  CAD can be treated with lifestyle changes, medicines, surgery, or a combination of these treatments. This information is not intended to replace advice given to you by your health care provider. Make sure you discuss any questions you have with your health care provider. Document Released: 01/07/2012 Document Revised: 10/05/2016 Document Reviewed: 10/05/2016 Elsevier Interactive Patient Education  [employer].      When it comes to diets, agreement about the perfect plan isn't easy to find, even among the experts. Experts at the [employer] developed an idea known as the Healthy Eating Plate. Just imagine a plate divided into logical, healthy portions.  The emphasis is on diet quality:  Load up on vegetables and fruits - one-half of your plate: Aim for color and variety, and remember that potatoes don't count.  Go for whole grains - one-quarter of your plate: Whole wheat,  barley, wheat berries, quinoa, oats, brown rice, and foods made with them. If you want pasta, go with whole wheat pasta.  Protein power - one-quarter of your plate: Fish, chicken, beans, and nuts are all healthy, versatile protein sources. Limit red meat.  The diet, however, does go beyond the plate, offering a few other suggestions.  Use healthy plant oils, such as olive, canola, soy, corn, sunflower and peanut. Check the labels, and avoid partially hydrogenated oil, which have unhealthy trans fats.  If you're thirsty, drink water. Coffee and tea are good in moderation, but skip sugary drinks and limit milk and dairy products to one or two daily servings.  The type of carbohydrate in the diet is more important than the amount. Some sources of carbohydrates, such as vegetables, fruits, whole grains, and  beans-are healthier than others.  Finally, stay active.

## 2018-09-04 LAB — COMPLETE METABOLIC PANEL WITH GFR
AG Ratio: 1.7 (calc) (ref 1.0–2.5)
ALBUMIN MSPROF: 4 g/dL (ref 3.6–5.1)
ALT: 22 U/L (ref 6–29)
AST: 25 U/L (ref 10–35)
Alkaline phosphatase (APISO): 102 U/L (ref 33–130)
BILIRUBIN TOTAL: 1.1 mg/dL (ref 0.2–1.2)
BUN: 11 mg/dL (ref 7–25)
CALCIUM: 9 mg/dL (ref 8.6–10.4)
CO2: 27 mmol/L (ref 20–32)
Chloride: 109 mmol/L (ref 98–110)
Creat: 0.95 mg/dL (ref 0.50–0.99)
GFR, EST AFRICAN AMERICAN: 72 mL/min/{1.73_m2} (ref >=60)
GFR, EST NON AFRICAN AMERICAN: 62 mL/min/{1.73_m2} (ref >=60)
Globulin: 2.4 g/dL (calc) (ref 1.9–3.7)
Glucose, Bld: 91 mg/dL (ref 65–99)
POTASSIUM: 4.6 mmol/L (ref 3.5–5.3)
Sodium: 145 mmol/L (ref 135–146)
TOTAL PROTEIN: 6.4 g/dL (ref 6.1–8.1)

## 2018-09-04 LAB — CBC WITH DIFFERENTIAL/PLATELET
BASOS PCT: 1.3 %
Basophils Absolute: 77 cells/uL (ref 0–200)
EOS ABS: 378 {cells}/uL (ref 15–500)
EOS PCT: 6.4 %
HCT: 36.2 % (ref 35.0–45.0)
Hemoglobin: 11.8 g/dL (ref 11.7–15.5)
Lymphs Abs: 2962 cells/uL (ref 850–3900)
MCH: 31 pg (ref 27.0–33.0)
MCHC: 32.6 g/dL (ref 32.0–36.0)
MCV: 95 fL (ref 80.0–100.0)
MPV: 10.9 fL (ref 7.5–12.5)
Monocytes Relative: 7.9 %
NEUTROS ABS: 2018 {cells}/uL (ref 1500–7800)
Neutrophils Relative %: 34.2 %
PLATELETS: 306 10*3/uL (ref 140–400)
RBC: 3.81 10*6/uL (ref 3.80–5.10)
RDW: 12.5 % (ref 11.0–15.0)
TOTAL LYMPHOCYTE: 50.2 %
WBC mixed population: 466 cells/uL (ref 200–950)
WBC: 5.9 10*3/uL (ref 3.8–10.8)

## 2018-09-04 LAB — IRON, TOTAL/TOTAL IRON BINDING CAP
%SAT: 32 % (calc) (ref 16–45)
IRON: 98 ug/dL (ref 45–160)
TIBC: 310 mcg/dL (calc) (ref 250–450)

## 2018-09-04 LAB — FERRITIN: FERRITIN: 100 ng/mL (ref 16–288)

## 2018-10-02 ENCOUNTER — Other Ambulatory Visit: Payer: Self-pay

## 2018-10-02 DIAGNOSIS — E785 Hyperlipidemia, unspecified: Secondary | ICD-10-CM

## 2018-10-02 MED ORDER — ATORVASTATIN CALCIUM 40 MG PO TABS: 40.0000 mg | ORAL_TABLET | Freq: Every day | ORAL | 1 refills | Status: DC

## 2018-10-16 ENCOUNTER — Ambulatory Visit: Payer: Self-pay | Admitting: Internal Medicine

## 2018-10-28 ENCOUNTER — Ambulatory Visit: Payer: Federal, State, Local not specified - PPO | Admitting: Adult Health Nurse Practitioner

## 2018-10-28 VITALS — BP 114/80 | HR 68 | Temp 97.7°F | Resp 16 | Ht 63.0 in | Wt 172.6 lb

## 2018-10-28 DIAGNOSIS — J0100 Acute maxillary sinusitis, unspecified: Secondary | ICD-10-CM

## 2018-10-28 DIAGNOSIS — R6889 Other general symptoms and signs: Secondary | ICD-10-CM

## 2018-10-28 MED ORDER — PREDNISONE 10 MG (21) PO TBPK: ORAL_TABLET | Freq: Every day | ORAL | 0 refills | Status: DC

## 2018-10-28 NOTE — Patient Instructions (Addendum)
Treat the symptoms   Take Zyrtec every night, this will help to dry up the drainage down your throat and your cough.  Continue Mucinex DM every 12 hours   Take Ibuprofen 600mg  every 6 hours  And rotate Tylenol 1,000mg  every 6.  No more than 4,000mg  tylenol in 24 hours period.  Good hand washing with soap ans water and cleaning shared surfaces will prevent the spread.    For sinusitis:  Take prednisone taper  Neti Pot you can get at any pharmacy.  Use warm bottled or distilled water for this.  DO NOT USE TAP WATER!  Use this twice a day.     Influenza, Adult Influenza is also called "the flu." It is an infection in the lungs, nose, and throat (respiratory tract). It is caused by a virus. The flu causes symptoms that are similar to symptoms of a cold. It also causes a high fever and body aches. The flu spreads easily from person to person (is contagious). Getting a flu shot (influenza vaccination) every year is the best way to prevent the flu. What are the causes? This condition is caused by the influenza virus. You can get the virus by:  Breathing in droplets that are in the air from the cough or sneeze of a person who has the virus.  Touching something that has the virus on it (is contaminated) and then touching your mouth, nose, or eyes. What increases the risk? Certain things may make you more likely to get the flu. These include:  Not washing your hands often.  Having close contact with many people during cold and flu season.  Touching your mouth, eyes, or nose without first washing your hands.  Not getting a flu shot every year. You may have a higher risk for the flu, along with serious problems such as a lung infection (pneumonia), if you:  Are older than 65.  Are pregnant.  Have a weakened disease-fighting system (immune system) because of a disease or taking certain medicines.  Have a long-term (chronic) illness, such as: ? Heart, kidney, or lung  disease. ? Diabetes. ? Asthma.  Have a liver disorder.  Are very overweight (morbidly obese).  Have anemia. This is a condition that affects your red blood cells. What are the signs or symptoms? Symptoms usually begin suddenly and last 4-14 days. They may include:  Fever and chills.  Headaches, body aches, or muscle aches.  Sore throat.  Cough.  Runny or stuffy (congested) nose.  Chest discomfort.  Not wanting to eat as much as normal (poor appetite).  Weakness or feeling tired (fatigue).  Dizziness.  Feeling sick to your stomach (nauseous) or throwing up (vomiting). How is this treated? If the flu is found early, you can be treated with medicine that can help reduce how bad the illness is and how long it lasts (antiviral medicine). This may be given by mouth (orally) or through an IV tube. Taking care of yourself at home can help your symptoms get better. Your doctor may suggest:  Taking over-the-counter medicines.  Drinking plenty of fluids. The flu often goes away on its own. If you have very bad symptoms or other problems, you may be treated in a hospital. Follow these instructions at home:     Activity  Rest as needed. Get plenty of sleep.  Stay home from work or school as told by your doctor. ? Do not leave home until you do not have a fever for 24 hours without taking  medicine. ? Leave home only to visit your doctor. Eating and drinking  Take an ORS (oral rehydration solution). This is a drink that is sold at pharmacies and stores.  Drink enough fluid to keep your pee (urine) pale yellow.  Drink clear fluids in small amounts as you are able. Clear fluids include: ? Water. ? Ice chips. ? Fruit juice that has water added (diluted fruit juice). ? Low-calorie sports drinks.  Eat bland, easy-to-digest foods in small amounts as you are able. These foods include: ? Bananas. ? Applesauce. ? Rice. ? Lean meats. ? Toast. ? Crackers.  Do not eat or  drink: ? Fluids that have a lot of sugar or caffeine. ? Alcohol. ? Spicy or fatty foods. General instructions  Take over-the-counter and prescription medicines only as told by your doctor.  Use a cool mist humidifier to add moisture to the air in your home. This can make it easier for you to breathe.  Cover your mouth and nose when you cough or sneeze.  Wash your hands with soap and water often, especially after you cough or sneeze. If you cannot use soap and water, use alcohol-based hand sanitizer.  Keep all follow-up visits as told by your doctor. This is important. How is this prevented?   Get a flu shot every year. You may get the flu shot in late summer, fall, or winter. Ask your doctor when you should get your flu shot.  Avoid contact with people who are sick during fall and winter (cold and flu season). Contact a doctor if:  You get new symptoms.  You have: ? Chest pain. ? Watery poop (diarrhea). ? A fever.  Your cough gets worse.  You start to have more mucus.  You feel sick to your stomach.  You throw up. Get help right away if you:  Have shortness of breath.  Have trouble breathing.  Have skin or nails that turn a bluish color.  Have very bad pain or stiffness in your neck.  Get a sudden headache.  Get sudden pain in your face or ear.  Cannot eat or drink without throwing up. Summary  Influenza ("the flu") is an infection in the lungs, nose, and throat. It is caused by a virus.  Take over-the-counter and prescription medicines only as told by your doctor.  Getting a flu shot every year is the best way to avoid getting the flu. This information is not intended to replace advice given to you by your health care provider. Make sure you discuss any questions you have with your health care provider. Document Released: 07/24/2008 Document Revised: 04/02/2018 Document Reviewed: 04/02/2018 Elsevier Interactive Patient Education  2019 [employer].

## 2018-10-28 NOTE — Progress Notes (Signed)
Assessment and Plan:  Brittne was seen today for uri symptoms  Diagnoses and all orders for this visit:  Acute non-recurrent maxillary sinusitis -     predniSONE (STERAPRED UNI-PAK 21 TAB) 10 MG (21) TBPK tablet; Take by mouth daily. Follow directions on Taper pack Use Neti pot BID.  Use warm bottled or distilled water. DO NOT USE TAP WATER! TakeOTC  zyrtec nightly  Influenza-like symptoms Did not do influenza swab as it has been over 72 hours since exposure.  Recent exposure, treat the symptoms Cough take Mucinex DM every 12 hours Body aches: Ibuprofen 600mg  very 6 hours and rotate tylenol 1,000mg  every 6 hours.  Do not exceed 4,000mg  tylenol in 24 hours.  Increase water intake or sports drink to prevent dehydration.  To prevent spread, cover mouth when coughing and wash hand with soap and water.  Sanitizes shared surfaces.   Call or return with new or worsening symptoms as discussed in appointment.  May contact via office phone [phone] or via [place].   Further disposition pending results of labs. Discussed med's effects and SE's.   Over 30 minutes of exam, counseling, chart review, and critical decision making was performed.   Future Appointments  Date Time Provider [place]  10/28/2018  2:30 PM Garnet Sierras, NP [facility] None  11/07/2018 10:15 AM [name], NP [facility] None  02/13/2019 11:00 AM [name], MD [facility] None  08/25/2019 10:00 AM [name], MD [facility] None    ------------------------------------------------------------------------------------------------------------------   HPI 67 y.o.female presents for cough, fever, sweats, chills right ear pain, body aches that started four days ago.  Reports that her granddaughter diagnosed influenza type B.  She has been taking Mucinex and tylenol.  She has not had an appetitie and reports she is not drinking much either.  She feels fatigued and has been resting most of the  time in bed.      Past Medical History:  Diagnosis Date  . Allergic rhinitis   . Arthritis   . Cholecystitis, chronic   . Dysrhythmia    Hx bradycardia - hx normal cardiac cath x2 / ECHO 2009 EF 60% LAE MYOVIEW 3/12 EF 60% NO ISCHEMIA  . Hyperlipidemia   . Hypertension    no treatment after 120 lb weight loss  . Hypothyroidism   . Prediabetes   . Thyroid nodule      Allergies  Allergen Reactions  . Codeine Other (See Comments)    "puts me out of it".  Dysphoria.  Patient tolerates Tramadol without complications.  . Nabumetone     GI upset    Current Outpatient Medications on File Prior to Visit  Medication Sig  . alum & mag hydroxide-simeth (MAALOX/MYLANTA) 200-200-20 MG/5ML suspension Take 15 mLs by mouth every 6 (six) hours as needed for indigestion or heartburn.  Marland Kitchen amLODipine (NORVASC) 5 MG tablet Take 1 tablet (5 mg total) by mouth daily.  . Ascorbic Acid (VITAMIN C) 500 MG CAPS Take 1 tablet by mouth daily.   Marland Kitchen aspirin 81 MG tablet Take 81 mg by mouth daily.  Marland Kitchen atorvastatin (LIPITOR) 40 MG tablet Take 1 tablet (40 mg total) by mouth daily at 6 PM.  . Biotin 10 MG TABS Take 1 tablet (10 mg total) by mouth daily.  . Calcium Citrate-Vitamin D (CALCIUM CITRATE + D3 PO) Take 600 mg by mouth daily.   . diphenhydramine-acetaminophen (TYLENOL PM) 25-500 MG TABS tablet Take 1 tablet by mouth at bedtime.   . Ferrous Sulfate (SLOW RELEASE IRON  PO) Take 1 tablet by mouth daily.   . fish oil-omega-3 fatty acids 1000 MG capsule Take 1 g by mouth daily.  . fluticasone (FLONASE) 50 MCG/ACT nasal spray Place 2 sprays into both nostrils daily as needed for allergies.  Marland Kitchen gabapentin (NEURONTIN) 100 MG capsule Take 1 capsule (100 mg total) by mouth 2 (two) times daily.  Marland Kitchen levothyroxine (SYNTHROID, LEVOTHROID) 75 MCG tablet Take 0.5-1 tablets (37.5-75 mcg total) by mouth See admin instructions. Take 0.5 tablet everyday except Monday and Thursday take 1 tablet.  . Magnesium 250 MG TABS Take 1  tablet by mouth daily.   . Melatonin 10 MG TABS Take 1 tablet by mouth at bedtime.  . meloxicam (MOBIC) 15 MG tablet Take 1 tablet (15 mg total) by mouth daily as needed for pain.  . nitroGLYCERIN (NITROSTAT) 0.4 MG SL tablet Place 1 tablet (0.4 mg total) under the tongue every 5 (five) minutes as needed for chest pain.  Marland Kitchen ondansetron (ZOFRAN) 4 MG tablet Take 1 tablet (4 mg total) by mouth every 8 (eight) hours as needed for nausea or vomiting.  . pantoprazole (PROTONIX) 40 MG tablet Take 1 tablet (40 mg total) by mouth daily.  . potassium chloride (K-DUR) 10 MEQ tablet Take 1 tablet (10 mEq total) by mouth daily.  Marland Kitchen rOPINIRole (REQUIP) 0.5 MG tablet TAKE ONE-HALF (1/2) TABLET AT NIGHT. CAN INCREASE TO TAKE ONE TABLET, THEN MAY INCREASE UP TO 4 TABLETS AT NIGHT AS DIRECTED. (Patient taking differently: Take 0.5 mg by mouth at bedtime. )  . vitamin B-12 (CYANOCOBALAMIN) 1000 MCG tablet Take 1,000 mcg by mouth daily.   . Vitamin D, Ergocalciferol, (DRISDOL) 50000 units CAPS capsule Take 1 capsule (50,000 Units total) by mouth every other day.   No current facility-administered medications on file prior to visit.     ROS: Review of Systems  Constitutional: Positive for chills, fever and malaise/fatigue. Negative for diaphoresis and weight loss.  HENT: Positive for congestion and sinus pain. Negative for ear discharge, ear pain, hearing loss, nosebleeds, sore throat and tinnitus.   Respiratory: Positive for cough and sputum production. Negative for hemoptysis, shortness of breath, wheezing and stridor.   Gastrointestinal: Negative for abdominal pain, blood in stool, constipation, diarrhea, heartburn, melena, nausea and vomiting.  Skin: Negative for itching and rash.  Neurological: Negative for dizziness, tingling, tremors, sensory change, speech change, focal weakness, seizures, loss of consciousness, weakness and headaches.     Physical Exam:  BP 114/80   Pulse 68   Temp 97.7 F (36.5 C)    Resp 16   Ht 5\' 3"  (1.6 m)   Wt 172 lb 9.6 oz (78.3 kg)   LMP  (LMP Unknown)   BMI 30.57 kg/m   General Appearance: Well nourished, in no apparent distress. Eyes: PERRLA, EOMs, conjunctiva no swelling or erythema Sinuses:  Frontal/maxillary tenderness. ENT/Mouth: Ext aud canals clear, TMs without erythema, bulging. No erythema, swelling, or exudate on post pharynx.  Tonsils not swollen or erythematous. Hearing normal.  Neck: Supple, thyroid normal.  Respiratory: Respiratory effort normal, BS equal bilaterally without rales,  wheezing or stridor. Rhonchi noted bilaterally. Cardio: RRR with no MRGs. Brisk peripheral pulses without edema.  Abdomen: Soft, + BS.  Non tender, no guarding, rebound, hernias, masses. Lymphatics: Non tender without lymphadenopathy.  Musculoskeletal: Full ROM, 5/5 strength, normal gait.  Skin: Warm, dry without rashes, lesions, ecchymosis.  Neuro: Cranial nerves intact. Normal muscle tone, no cerebellar symptoms. Sensation intact.  Psych: Awake and oriented X 3,  normal affect, Insight and Judgment appropriate.     Garnet Sierras, NP 2:14 PM [facility]

## 2018-11-01 ENCOUNTER — Encounter: Payer: Self-pay | Admitting: Adult Health Nurse Practitioner

## 2018-11-07 ENCOUNTER — Ambulatory Visit: Payer: Self-pay | Admitting: Adult Health

## 2018-11-21 NOTE — Progress Notes (Signed)
FOLLOW UP  Assessment and Plan:   CAD Control blood pressure, cholesterol, glucose, increase exercise.  Followed by Dr. Percival Spanish  Hypothyroidism, unspecified type continue medications the same: reminded to take on an empty stomach 30-76mins before food.  check TSH level  Hypertension Well controlled with current medications  Monitor blood pressure at home; patient to call if consistently greater than 130/80 Continue DASH diet.   Reminder to go to the ER if any CP, SOB, nausea, dizziness, severe HA, changes vision/speech, left arm numbness and tingling and jaw pain.  Cholesterol Currently at goal;  Continue low cholesterol diet and exercise.  Check lipid panel.   Other abnormal glucose Discussed risks associated with elevated glucose Continue diet and exercise.  Perform daily foot/skin check, notify office of any concerning changes.  Defer A1C; check CMP  Overweight Long discussion about weight loss, diet, and exercise Recommended diet heavy in fruits and veggies and low in animal meats, cheeses, and dairy products, appropriate calorie intake Discussed ideal weight for height, initial weight goal of 160lb Patient will work on increasing water, adding beans, add weight lifting Will follow up in 3 months  Vitamin D Def At goal at last visit; continue supplementation to maintain goal of 60-100 Defer Vit D level  GERD Well managed on current medications, PPI daily and mylanta PRN occasionally only  Discussed diet, avoiding triggers and other lifestyle change  Acute upper respiratory symptoms/maxillary sinusitis Viral vs seasonal allergies; mild sinusitis sx Discussed the importance of avoiding unnecessary antibiotic therapy Suggested symptomatic OTC remedies. Nasal saline spray for congestion. Nasal steroids, allergy pill, oral steroids offered Follow up as needed if not improving in 7-9 days  Continue diet and meds as discussed. Further disposition pending results  of labs. Discussed med's effects and SE's.   Over 30 minutes of exam, counseling, chart review, and critical decision making was performed.   Future Appointments  Date Time Provider [place]  02/13/2019 11:00 AM [name], MD [facility] None  08/25/2019 10:00 AM [name], MD [facility] None    ----------------------------------------------------------------------------------------------------------------------  HPI 68 y.o. female  presents for 3 month follow up on hypertension, cholesterol, glucose management, weight (s/p roux-en-y) and vitamin D deficiency. 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  She reports nasal congestion, sinus tenderness, sinus headache, post-nasal drip x 2-3 days; she has been doing mucinex, flonase without much improvement. Not currently on allergy medication. Denies cough, sore throat, fever/chills. She does have headache, some vision changes for several weeks, has ophthalmology visit scheduled.   She has hx of GERD/hiatal hernia, was diagnosed with H.pylori + ulcer 12/17, and again in 09/2017. She had EGD in 02/2018 which was unremarkable with neg H. Pylori. she is currently managed by 40 mg protonix daily, PRN mylanta, using a few times a month.  she reports symptoms is currently well controlled, and denies breakthrough reflux, burning in chest, hoarseness or cough.    BMI is Body mass index is 30.65 kg/m., she has been working on diet and exercise, watching portions, cutting back on sweet tea. She is active but not intentionally exercising. Wt Readings from Last 3 Encounters:  11/24/18 173 lb (78.5 kg)  10/28/18 172 lb 9.6 oz (78.3 kg)  09/03/18 180 lb 3.2 oz (81.7 kg)   Her blood pressure has been controlled at home, today their BP is BP: 118/78  She does workout. She denies chest pain,  shortness of breath, dizziness.   She is on cholesterol medication (atorvastatin 40 mg daily) and denies myalgias. Her cholesterol is at goal. The cholesterol last visit was:   Lab Results  Component Value Date   CHOL 156 08/21/2018   HDL 64 08/21/2018   LDLCALC 85 08/21/2018   TRIG 36 08/21/2018   CHOLHDL 2.4 08/21/2018    She has been working on diet and exercise for glucose management, and denies increased appetite, nausea, paresthesia of the feet, polydipsia, polyuria, visual disturbances, vomiting and weight loss. Last A1C in the office was:  Lab Results  Component Value Date   HGBA1C 5.5 08/21/2018   She is on thyroid medication. Her medication was not changed last visit.   Lab Results  Component Value Date   TSH 2.67 07/31/2018   Patient is on Vitamin D supplement.   Lab Results  Component Value Date   VD25OH 61 07/31/2018        Current Medications:  Current Outpatient Medications on File Prior to Visit  Medication Sig  . alum & mag hydroxide-simeth (MAALOX/MYLANTA) 200-200-20 MG/5ML suspension Take 15 mLs by mouth every 6 (six) hours as needed for indigestion or heartburn.  Marland Kitchen amLODipine (NORVASC) 5 MG tablet Take 1 tablet (5 mg total) by mouth daily.  . Ascorbic Acid (VITAMIN C) 500 MG CAPS Take 1 tablet by mouth daily.   Marland Kitchen aspirin 81 MG tablet Take 81 mg by mouth daily.  Marland Kitchen atorvastatin (LIPITOR) 40 MG tablet Take 1 tablet (40 mg total) by mouth daily at 6 PM.  . Biotin 10 MG TABS Take 1 tablet (10 mg total) by mouth daily.  . Calcium Citrate-Vitamin D (CALCIUM CITRATE + D3 PO) Take 600 mg by mouth daily.   . diphenhydramine-acetaminophen (TYLENOL PM) 25-500 MG TABS tablet Take 1 tablet by mouth at bedtime.   . Ferrous Sulfate (SLOW RELEASE IRON PO) Take 1 tablet by mouth daily.   . fish oil-omega-3 fatty acids 1000 MG capsule Take 1 g by mouth daily.  . fluticasone (FLONASE) 50 MCG/ACT nasal spray Place 2 sprays into both nostrils daily as needed for allergies.  Marland Kitchen  gabapentin (NEURONTIN) 100 MG capsule Take 1 capsule (100 mg total) by mouth 2 (two) times daily.  Marland Kitchen levothyroxine (SYNTHROID, LEVOTHROID) 75 MCG tablet Take 0.5-1 tablets (37.5-75 mcg total) by mouth See admin instructions. Take 0.5 tablet everyday except Monday and Thursday take 1 tablet.  . Magnesium 250 MG TABS Take 1 tablet by mouth daily.   . Melatonin 10 MG TABS Take 1 tablet by mouth at bedtime.  . meloxicam (MOBIC) 15 MG tablet Take 1 tablet (15 mg total) by mouth daily as needed for pain.  . nitroGLYCERIN (NITROSTAT) 0.4 MG SL tablet Place 1 tablet (0.4 mg total) under the tongue every 5 (five) minutes as needed for chest pain.  Marland Kitchen ondansetron (ZOFRAN) 4 MG tablet Take 1 tablet (4 mg total) by mouth every 8 (eight) hours as needed for nausea or vomiting.  . pantoprazole (PROTONIX) 40 MG tablet Take 1 tablet (40 mg total) by mouth daily.  . potassium chloride (K-DUR) 10 MEQ tablet Take 1 tablet (10 mEq total) by mouth daily.  . predniSONE (STERAPRED UNI-PAK 21 TAB) 10 MG (21) TBPK tablet Take by mouth daily. Follow directions on Taper pack  . rOPINIRole (REQUIP) 0.5 MG tablet TAKE ONE-HALF (1/2) TABLET AT NIGHT. CAN INCREASE TO TAKE ONE TABLET, THEN MAY INCREASE UP TO 4 TABLETS  AT NIGHT AS DIRECTED. (Patient taking differently: Take 0.5 mg by mouth at bedtime. )  . vitamin B-12 (CYANOCOBALAMIN) 1000 MCG tablet Take 1,000 mcg by mouth daily.   . Vitamin D, Ergocalciferol, (DRISDOL) 50000 units CAPS capsule Take 1 capsule (50,000 Units total) by mouth every other day.   No current facility-administered medications on file prior to visit.      Allergies:  Allergies  Allergen Reactions  . Codeine Other (See Comments)    "puts me out of it".  Dysphoria.  Patient tolerates Tramadol without complications.  . Nabumetone     GI upset     Medical History:  Past Medical History:  Diagnosis Date  . Allergic rhinitis   . Arthritis   . Cholecystitis, chronic   . Dysrhythmia    Hx  bradycardia - hx normal cardiac cath x2 / ECHO 2009 EF 60% LAE MYOVIEW 3/12 EF 60% NO ISCHEMIA  . Hyperlipidemia   . Hypertension    no treatment after 120 lb weight loss  . Hypothyroidism   . Prediabetes   . Thyroid nodule    Family history- Reviewed and unchanged Social history- Reviewed and unchanged   Review of Systems:  Review of Systems  Constitutional: Negative for malaise/fatigue and weight loss.  HENT: Positive for congestion and sinus pain. Negative for hearing loss, sore throat and tinnitus.   Eyes: Negative for blurred vision and double vision.  Respiratory: Negative for cough, shortness of breath and wheezing.   Cardiovascular: Negative for chest pain, palpitations, orthopnea, claudication and leg swelling.  Gastrointestinal: Negative for abdominal pain, blood in stool, constipation, diarrhea, heartburn, melena, nausea and vomiting.  Genitourinary: Negative.   Musculoskeletal: Negative for joint pain and myalgias.  Skin: Negative for rash.  Neurological: Negative for dizziness, tingling, sensory change, weakness and headaches.  Endo/Heme/Allergies: Positive for environmental allergies. Negative for polydipsia.  Psychiatric/Behavioral: Negative.  Negative for depression. The patient is not nervous/anxious and does not have insomnia.   All other systems reviewed and are negative.   Physical Exam: BP 118/78   Pulse (!) 55   Temp (!) 97.5 F (36.4 C)   Ht 5\' 3"  (1.6 m)   Wt 173 lb (78.5 kg)   LMP  (LMP Unknown)   SpO2 97%   BMI 30.65 kg/m  Wt Readings from Last 3 Encounters:  11/24/18 173 lb (78.5 kg)  10/28/18 172 lb 9.6 oz (78.3 kg)  09/03/18 180 lb 3.2 oz (81.7 kg)   General Appearance: Well nourished, in no apparent distress. Eyes: PERRLA, EOMs, conjunctiva no swelling or erythema Sinuses: No Frontal tenderness, mild L sided maxillary tenderness ENT/Mouth: Ext aud canals clear, TMs without erythema, bulging. No erythema, swelling, or exudate on post  pharynx.  Tonsils not swollen or erythematous. Hearing normal.  Neck: Supple, thyroid normal.  Respiratory: Respiratory effort normal, BS equal bilaterally without rales, rhonchi, wheezing or stridor.  Cardio: RRR with no MRGs. Brisk peripheral pulses without edema.  Abdomen: Soft, + BS.  Non tender, no guarding, rebound, hernias, masses. Lymphatics: Non tender without lymphadenopathy.  Musculoskeletal: Full ROM, 5/5 strength, Normal gait Skin: Warm, dry without rashes, lesions, ecchymosis.  Neuro: Cranial nerves intact. No cerebellar symptoms.  Psych: Awake and oriented X 3, normal affect, Insight and Judgment appropriate.    Izora Ribas, NP 10:44 AM [facility]

## 2018-11-24 ENCOUNTER — Encounter: Payer: Self-pay | Admitting: Adult Health

## 2018-11-24 ENCOUNTER — Ambulatory Visit: Payer: Federal, State, Local not specified - PPO | Admitting: Adult Health

## 2018-11-24 VITALS — BP 118/78 | HR 55 | Temp 97.5°F | Ht 63.0 in | Wt 173.0 lb

## 2018-11-24 DIAGNOSIS — I10 Essential (primary) hypertension: Secondary | ICD-10-CM

## 2018-11-24 DIAGNOSIS — E559 Vitamin D deficiency, unspecified: Secondary | ICD-10-CM

## 2018-11-24 DIAGNOSIS — E785 Hyperlipidemia, unspecified: Secondary | ICD-10-CM

## 2018-11-24 DIAGNOSIS — Z79899 Other long term (current) drug therapy: Secondary | ICD-10-CM

## 2018-11-24 DIAGNOSIS — R7309 Other abnormal glucose: Secondary | ICD-10-CM

## 2018-11-24 DIAGNOSIS — E039 Hypothyroidism, unspecified: Secondary | ICD-10-CM

## 2018-11-24 DIAGNOSIS — K219 Gastro-esophageal reflux disease without esophagitis: Secondary | ICD-10-CM | POA: Diagnosis not present

## 2018-11-24 DIAGNOSIS — I2510 Atherosclerotic heart disease of native coronary artery without angina pectoris: Secondary | ICD-10-CM | POA: Diagnosis not present

## 2018-11-24 DIAGNOSIS — J0100 Acute maxillary sinusitis, unspecified: Secondary | ICD-10-CM

## 2018-11-24 DIAGNOSIS — E663 Overweight: Secondary | ICD-10-CM

## 2018-11-24 MED ORDER — PREDNISONE 20 MG PO TABS: ORAL_TABLET | ORAL | 0 refills | Status: DC

## 2018-11-24 MED ORDER — LEVOTHYROXINE SODIUM 50 MCG PO TABS: ORAL_TABLET | ORAL | 1 refills | Status: DC

## 2018-11-24 NOTE — Patient Instructions (Addendum)
Goals    . Blood Pressure < 130/80    . DIET - REDUCE SUGAR INTAKE    . Exercise 150 min/wk Moderate Activity     Aim for at least 15 min of brisk walking daily     . LDL CALC < 70    . Weight (lb) < 160 lb (72.6 kg)       Add daily allergy medication - allegra (fexofenadine) daily recommended   Avoid sudafed due to your heart history    HOW TO TREAT VIRAL COUGH AND COLD SYMPTOMS:  -Symptoms usually last at least 1 week with the worst symptoms being around day 4.  - colds usually start with a sore throat and end with a cough, and the cough can take 2 weeks to get better.  -No antibiotics are needed for colds, flu, sore throats, cough, bronchitis UNLESS symptoms are longer than 7 days OR if you are getting better then get drastically worse.  -There are a lot of combination medications (Dayquil, Nyquil, Vicks 44, tyelnol cold and sinus, ETC). Please look at the ingredients on the back so that you are treating the correct symptoms and not doubling up on medications/ingredients.    Medicines you can use  Nasal congestion  Little Remedies saline spray (aerosol/mist)- can try this, it is in the kids section - pseudoephedrine (Sudafed)- behind the counter, do not use if you have high blood pressure, medicine that have -D in them.  - phenylephrine (Sudafed PE) -Dextormethorphan + chlorpheniramine (Coridcidin HBP)- okay if you have high blood pressure -Oxymetazoline (Afrin) nasal spray- LIMIT to 3 days -Saline nasal spray -Neti pot (used distilled or bottled water)  Ear pain/congestion  -pseudoephedrine (sudafed) - Nasonex/flonase nasal spray  Fever  -Acetaminophen (Tyelnol) -Ibuprofen (Advil, motrin, aleve)  Sore Throat  -Acetaminophen (Tyelnol) -Ibuprofen (Advil, motrin, aleve) -Drink a lot of water -Gargle with salt water - Rest your voice (don't talk) -Throat sprays -Cough drops  Body Aches  -Acetaminophen (Tyelnol) -Ibuprofen (Advil, motrin,  aleve)  Headache  -Acetaminophen (Tyelnol) -Ibuprofen (Advil, motrin, aleve) - Exedrin, Exedrin Migraine  Allergy symptoms (cough, sneeze, runny nose, itchy eyes) -Claritin or loratadine cheapest but likely the weakest  -Zyrtec or certizine at night because it can make you sleepy -The strongest is allegra or fexafinadine  Cheapest at [employer], [employer], [employer]  Cough  -Dextromethorphan (Delsym)- medicine that has DM in it -Guafenesin (Mucinex/Robitussin) - cough drops - drink lots of water  Chest Congestion  -Guafenesin (Mucinex/Robitussin)  Red Itchy Eyes  - Naphcon-A  Upset Stomach  - Bland diet (nothing spicy, greasy, fried, and high acid foods like tomatoes, oranges, berries) -OKAY- cereal, bread, soup, crackers, rice -Eat smaller more frequent meals -reduce caffeine, no alcohol -Loperamide (Imodium-AD) if diarrhea -Prevacid for heart burn  General health when sick  -Hydration -wash your hands frequently -keep surfaces clean -change pillow cases and sheets often -Get fresh air but do not exercise strenuously -Vitamin D, double up on it - Vitamin C -Zinc

## 2018-11-25 ENCOUNTER — Other Ambulatory Visit: Payer: Self-pay

## 2018-11-25 LAB — COMPLETE METABOLIC PANEL WITH GFR
AG Ratio: 1.6 (calc) (ref 1.0–2.5)
ALKALINE PHOSPHATASE (APISO): 112 U/L (ref 33–130)
ALT: 35 U/L — ABNORMAL HIGH (ref 6–29)
AST: 35 U/L (ref 10–35)
Albumin: 3.9 g/dL (ref 3.6–5.1)
BUN: 9 mg/dL (ref 7–25)
CO2: 27 mmol/L (ref 20–32)
Calcium: 8.9 mg/dL (ref 8.6–10.4)
Chloride: 109 mmol/L (ref 98–110)
Creat: 0.86 mg/dL (ref 0.50–0.99)
GFR, Est African American: 82 mL/min/{1.73_m2} (ref >=60)
GFR, Est Non African American: 70 mL/min/{1.73_m2} (ref >=60)
GLUCOSE: 84 mg/dL (ref 65–99)
Globulin: 2.5 g/dL (calc) (ref 1.9–3.7)
Potassium: 4.3 mmol/L (ref 3.5–5.3)
SODIUM: 142 mmol/L (ref 135–146)
Total Bilirubin: 1.4 mg/dL — ABNORMAL HIGH (ref 0.2–1.2)
Total Protein: 6.4 g/dL (ref 6.1–8.1)

## 2018-11-25 LAB — TSH: TSH: 1.3 mIU/L (ref 0.40–4.50)

## 2018-11-25 LAB — CBC WITH DIFFERENTIAL/PLATELET
ABSOLUTE MONOCYTES: 454 {cells}/uL (ref 200–950)
BASOS PCT: 1.1 %
Basophils Absolute: 62 cells/uL (ref 0–200)
Eosinophils Absolute: 353 cells/uL (ref 15–500)
Eosinophils Relative: 6.3 %
HCT: 36.8 % (ref 35.0–45.0)
HEMOGLOBIN: 12.2 g/dL (ref 11.7–15.5)
Lymphs Abs: 1932 cells/uL (ref 850–3900)
MCH: 30.8 pg (ref 27.0–33.0)
MCHC: 33.2 g/dL (ref 32.0–36.0)
MCV: 92.9 fL (ref 80.0–100.0)
MONOS PCT: 8.1 %
MPV: 12 fL (ref 7.5–12.5)
NEUTROS ABS: 2800 {cells}/uL (ref 1500–7800)
Neutrophils Relative %: 50 %
PLATELETS: 215 10*3/uL (ref 140–400)
RBC: 3.96 10*6/uL (ref 3.80–5.10)
RDW: 12.6 % (ref 11.0–15.0)
TOTAL LYMPHOCYTE: 34.5 %
WBC: 5.6 10*3/uL (ref 3.8–10.8)

## 2018-11-25 LAB — LIPID PANEL
CHOLESTEROL: 96 mg/dL (ref <200)
HDL: 51 mg/dL (ref >50)
LDL CHOLESTEROL (CALC): 32 mg/dL
Non-HDL Cholesterol (Calc): 45 mg/dL (calc) (ref <130)
TRIGLYCERIDES: 53 mg/dL (ref <150)
Total CHOL/HDL Ratio: 1.9 (calc) (ref <5.0)

## 2018-11-25 LAB — MAGNESIUM: MAGNESIUM: 2 mg/dL (ref 1.5–2.5)

## 2018-11-25 MED ORDER — LEVOTHYROXINE SODIUM 50 MCG PO TABS: ORAL_TABLET | ORAL | 1 refills | Status: DC

## 2019-01-08 ENCOUNTER — Other Ambulatory Visit: Payer: Self-pay

## 2019-01-08 ENCOUNTER — Encounter: Payer: Self-pay | Admitting: Adult Health

## 2019-01-08 ENCOUNTER — Ambulatory Visit: Payer: Federal, State, Local not specified - PPO | Admitting: Adult Health

## 2019-01-08 VITALS — BP 134/82 | HR 59 | Temp 97.5°F | Wt 176.6 lb

## 2019-01-08 DIAGNOSIS — Z8719 Personal history of other diseases of the digestive system: Secondary | ICD-10-CM

## 2019-01-08 DIAGNOSIS — R1032 Left lower quadrant pain: Secondary | ICD-10-CM | POA: Diagnosis not present

## 2019-01-08 MED ORDER — CIPROFLOXACIN HCL 500 MG PO TABS: 500.0000 mg | ORAL_TABLET | Freq: Two times a day (BID) | ORAL | 0 refills | Status: DC

## 2019-01-08 MED ORDER — METRONIDAZOLE 500 MG PO TABS: 500.0000 mg | ORAL_TABLET | Freq: Three times a day (TID) | ORAL | 0 refills | Status: DC

## 2019-01-08 NOTE — Progress Notes (Signed)
Assessment and Plan:  Tracy Paul was seen today for flank pain.  Diagnoses and all orders for this visit:  LLQ abdominal pain/History of diverticulosis No red flags; intermittent sx; check labs, but suggestive of mild diverticulitis Benign, non-acute exam, defer imaging for now, don't suspect perforation or other complication Check labs, will proceed with presumptive treatment The patient was advised to call immediately if she has any concerning symptoms in the interval. The patient voices understanding of current treatment options and is in agreement with the current care plan.The patient knows to call the clinic with any problems, questions or concerns or go to the ER if any further progression of symptoms.  call back on Monday with progress - get imaging at that time if not improving -     CBC with Differential/Platelet -     COMPLETE METABOLIC PANEL WITH GFR -     Urinalysis w microscopic + reflex cultur -     ciprofloxacin (CIPRO) 500 MG tablet; Take 1 tablet (500 mg total) by mouth 2 (two) times daily. -     metroNIDAZOLE (FLAGYL) 500 MG tablet; Take 1 tablet (500 mg total) by mouth 3 (three) times daily.  Further disposition pending results of labs. Discussed med's effects and SE's.   Over 15 minutes of exam, counseling, chart review, and critical decision making was performed.   Future Appointments  Date Time Provider [place]  01/08/2019  2:00 PM [name], NP [facility] None  03/30/2019 10:30 AM [name], MD [facility] None  08/25/2019 10:00 AM [name], MD [facility] None    ------------------------------------------------------------------------------------------------------------------   HPI BP 134/82   Pulse (!) 59   Temp (!) 97.5 F (36.4 C)   Wt 176 lb 9.6 oz (80.1 kg)   LMP  (LMP Unknown)   SpO2 99%   BMI 31.28 kg/m   68 y.o.female with hx diverticulosis, colonic polyps, diverticulosis of colon, GERD presents for evaluation of  intermittent L lower abdominal pain that intermittently ongoing for 2-3 weeks, though increasing in frequency and seems to be getting worse. She first noted this as "aggravating" sensation, now more sharp 7/10, "makes me catch my breath" - she reports episodic, lasting 5-10 min, then may not have for several hours, then may have dull discomfort. No aggravating activities, no relieving activities. Not associated with position or food intake. Does not wake her up at night. She did have a few days of sensation of chills.   She has felt "blah." She denies nausea, vomiting, appetite normal, fever, constipation or diarrhea.   She feels she is urinating more than usual, denies urgency, dysura, changes in urine character  Denies history of renal calculi  She has had tubal ligation, s/p cholecystectomy and gastric bypass.   Colonoscopy 05/28/2018:  - A 3 mm polyp was found in the cecum. The polyp was sessile. The polyp was removed with a cold biopsy forceps. Resection and retrieval were complete. - A 5 mm polyp was found in the splenic flexure. The polyp was sessile. The polyp was removed with a cold snare. Resection and retrieval were complete. - Scattered small and large-mouthed diverticula were found in the sigmoid colon, descending colon, transverse colon, ascending colon and cecum. There was evidence of diverticular spasm. There was evidence of an impacted diverticulum.   Past Medical History:  Diagnosis Date  . Allergic rhinitis   . Arthritis   . Cholecystitis, chronic   . Dysrhythmia    Hx bradycardia - hx normal cardiac cath x2 / ECHO  2009 EF 60% LAE MYOVIEW 3/12 EF 60% NO ISCHEMIA  . Hyperlipidemia   . Hypertension    no treatment after 120 lb weight loss  . Hypothyroidism   . Prediabetes   . Thyroid nodule      Allergies  Allergen Reactions  . Codeine Other (See Comments)    "puts me out of it".  Dysphoria.  Patient tolerates Tramadol without complications.  . Nabumetone      GI upset    Current Outpatient Medications on File Prior to Visit  Medication Sig  . alum & mag hydroxide-simeth (MAALOX/MYLANTA) 200-200-20 MG/5ML suspension Take 15 mLs by mouth every 6 (six) hours as needed for indigestion or heartburn.  Marland Kitchen amLODipine (NORVASC) 5 MG tablet Take 1 tablet (5 mg total) by mouth daily.  . Ascorbic Acid (VITAMIN C) 500 MG CAPS Take 1 tablet by mouth daily.   Marland Kitchen aspirin 81 MG tablet Take 81 mg by mouth daily.  Marland Kitchen atorvastatin (LIPITOR) 40 MG tablet Take 1 tablet (40 mg total) by mouth daily at 6 PM.  . Biotin 10 MG TABS Take 1 tablet (10 mg total) by mouth daily.  . Calcium Citrate-Vitamin D (CALCIUM CITRATE + D3 PO) Take 600 mg by mouth daily.   . diphenhydramine-acetaminophen (TYLENOL PM) 25-500 MG TABS tablet Take 1 tablet by mouth at bedtime.   . Ferrous Sulfate (SLOW RELEASE IRON PO) Take 1 tablet by mouth daily.   . fish oil-omega-3 fatty acids 1000 MG capsule Take 1 g by mouth daily.  . fluticasone (FLONASE) 50 MCG/ACT nasal spray Place 2 sprays into both nostrils daily as needed for allergies.  Marland Kitchen gabapentin (NEURONTIN) 100 MG capsule Take 1 capsule (100 mg total) by mouth 2 (two) times daily.  Marland Kitchen levothyroxine (SYNTHROID, LEVOTHROID) 50 MCG tablet Take 1 tab daily on empty stomach with water only for thyroid.  . Magnesium 250 MG TABS Take 1 tablet by mouth daily.   . Melatonin 10 MG TABS Take 1 tablet by mouth at bedtime.  . meloxicam (MOBIC) 15 MG tablet Take 1 tablet (15 mg total) by mouth daily as needed for pain.  . nitroGLYCERIN (NITROSTAT) 0.4 MG SL tablet Place 1 tablet (0.4 mg total) under the tongue every 5 (five) minutes as needed for chest pain.  Marland Kitchen ondansetron (ZOFRAN) 4 MG tablet Take 1 tablet (4 mg total) by mouth every 8 (eight) hours as needed for nausea or vomiting.  . pantoprazole (PROTONIX) 40 MG tablet Take 1 tablet (40 mg total) by mouth daily.  . potassium chloride (K-DUR) 10 MEQ tablet Take 1 tablet (10 mEq total) by mouth daily.  .  predniSONE (DELTASONE) 20 MG tablet 2 tablets daily for 3 days, 1 tablet daily for 4 days. (Patient taking differently: as needed. 2 tablets daily for 3 days, 1 tablet daily for 4 days.)  . rOPINIRole (REQUIP) 0.5 MG tablet TAKE ONE-HALF (1/2) TABLET AT NIGHT. CAN INCREASE TO TAKE ONE TABLET, THEN MAY INCREASE UP TO 4 TABLETS AT NIGHT AS DIRECTED. (Patient taking differently: Take 0.5 mg by mouth at bedtime. )  . vitamin B-12 (CYANOCOBALAMIN) 1000 MCG tablet Take 1,000 mcg by mouth daily.   . Vitamin D, Ergocalciferol, (DRISDOL) 50000 units CAPS capsule Take 1 capsule (50,000 Units total) by mouth every other day.   No current facility-administered medications on file prior to visit.     ROS: all negative except above.   Physical Exam:  BP 134/82   Pulse (!) 59   Temp (!) 97.5  F (36.4 C)   Wt 176 lb 9.6 oz (80.1 kg)   LMP  (LMP Unknown)   SpO2 99%   BMI 31.28 kg/m   General Appearance: Well nourished, in no apparent distress. Eyes: PERRLA, conjunctiva no swelling or erythema ENT/Mouth: No erythema, swelling, or exudate on post pharynx.  Tonsils not swollen or erythematous. Hearing normal.  Neck: Supple Respiratory: Respiratory effort normal, BS equal bilaterally without rales, rhonchi, wheezing or stridor.  Cardio: RRR with no MRGs. Brisk peripheral pulses without edema.  Abdomen: Soft, non-distended + BS.  Mild LLQ tenderness, no guarding, rebound, palpable hernias, masses. No CVA tenderness Lymphatics: Non tender without lymphadenopathy.  Musculoskeletal: Symmetrical strength, normal gait.  Skin: Warm, dry without rashes, lesions, ecchymosis.  Neuro: Normal muscle tone. Psych: Awake and oriented X 3, normal affect, Insight and Judgment appropriate.     Tracy Ribas, NP 1:53 PM [facility]

## 2019-01-08 NOTE — Patient Instructions (Signed)
Please go to the ER if you have any severe AB pain, unable to hold down food/water, blood in stool or vomit, chest pain, shortness of breath, or any worsening symptoms.     Diverticulitis  Diverticulitis is infection or inflammation of small pouches (diverticula) in the colon that form due to a condition called diverticulosis. Diverticula can trap stool (feces) and bacteria, causing infection and inflammation. Diverticulitis may cause severe stomach pain and diarrhea. It may lead to tissue damage in the colon that causes bleeding. The diverticula may also burst (rupture) and cause infected stool to enter other areas of the abdomen. Complications of diverticulitis can include:  Bleeding.  Severe infection.  Severe pain.  Rupture (perforation) of the colon.  Blockage (obstruction) of the colon. What are the causes? This condition is caused by stool becoming trapped in the diverticula, which allows bacteria to grow in the diverticula. This leads to inflammation and infection. What increases the risk? You are more likely to develop this condition if:  You have diverticulosis. The risk for diverticulosis increases if: ? You are overweight or obese. ? You use tobacco products. ? You do not get enough exercise.  You eat a diet that does not include enough fiber. High-fiber foods include fruits, vegetables, beans, nuts, and whole grains. What are the signs or symptoms? Symptoms of this condition may include:  Pain and tenderness in the abdomen. The pain is normally located on the left side of the abdomen, but it may occur in other areas.  Fever and chills.  Bloating.  Cramping.  Nausea.  Vomiting.  Changes in bowel routines.  Blood in your stool. How is this diagnosed? This condition is diagnosed based on:  Your medical history.  A physical exam.  Tests to make sure there is nothing else causing your condition. These tests may include: ? Blood tests. ? Urine tests. ?  Imaging tests of the abdomen, including X-rays, ultrasounds, MRIs, or CT scans. How is this treated? Most cases of this condition are mild and can be treated at home. Treatment may include:  Taking over-the-counter pain medicines.  Following a clear liquid diet.  Taking antibiotic medicines by mouth.  Rest. More severe cases may need to be treated at a hospital. Treatment may include:  Not eating or drinking.  Taking prescription pain medicine.  Receiving antibiotic medicines through an IV tube.  Receiving fluids and nutrition through an IV tube.  Surgery. When your condition is under control, your health care provider may recommend that you have a colonoscopy. This is an exam to look at the entire large intestine. During the exam, a lubricated, bendable tube is inserted into the anus and then passed into the rectum, colon, and other parts of the large intestine. A colonoscopy can show how severe your diverticula are and whether something else may be causing your symptoms. Follow these instructions at home: Medicines  Take over-the-counter and prescription medicines only as told by your health care provider. These include fiber supplements, probiotics, and stool softeners.  If you were prescribed an antibiotic medicine, take it as told by your health care provider. Do not stop taking the antibiotic even if you start to feel better.  Do not drive or use heavy machinery while taking prescription pain medicine. General instructions   Follow a full liquid diet or another diet as directed by your health care provider. After your symptoms improve, your health care provider may tell you to change your diet. He or she may recommend that  you eat a diet that contains at least 25 g (25 grams) of fiber daily. Fiber makes it easier to pass stool. Healthy sources of fiber include: ? Berries. One cup contains 4-8 grams of fiber. ? Beans or lentils. One half cup contains 5-8 grams of fiber. ?  Green vegetables. One cup contains 4 grams of fiber.  Exercise for at least 30 minutes, 3 times each week. You should exercise hard enough to raise your heart rate and break a sweat.  Keep all follow-up visits as told by your health care provider. This is important. You may need a colonoscopy. Contact a health care provider if:  Your pain does not improve.  You have a hard time drinking or eating food.  Your bowel movements do not return to normal. Get help right away if:  Your pain gets worse.  Your symptoms do not get better with treatment.  Your symptoms suddenly get worse.  You have a fever.  You vomit more than one time.  You have stools that are bloody, black, or tarry. Summary  Diverticulitis is infection or inflammation of small pouches (diverticula) in the colon that form due to a condition called diverticulosis. Diverticula can trap stool (feces) and bacteria, causing infection and inflammation.  You are at higher risk for this condition if you have diverticulosis and you eat a diet that does not include enough fiber.  Most cases of this condition are mild and can be treated at home. More severe cases may need to be treated at a hospital.  When your condition is under control, your health care provider may recommend that you have an exam called a colonoscopy. This exam can show how severe your diverticula are and whether something else may be causing your symptoms. This information is not intended to replace advice given to you by your health care provider. Make sure you discuss any questions you have with your health care provider. Document Released: 07/25/2005 Document Revised: 11/17/2016 Document Reviewed: 11/17/2016 Else[employer] Interactive Patient Education  2019 [employer]   Ciprofloxacin tablets What is this medicine? CIPROFLOXACIN (sip roe FLOX a sin) is a quinolone antibiotic. It is used to treat certain kinds of bacterial infections. It will not work for  colds, flu, or other viral infections. This medicine may be used for other purposes; ask your health care provider or pharmacist if you have questions. COMMON BRAND NAME(S): Cipro What should I tell my health care provider before I take this medicine? They need to know if you have any of these conditions: -bone problems -diabetes -heart disease -high blood pressure -history of irregular heartbeat -history of low levels of potassium in the blood -joint problems -kidney disease -liver disease -mental illness -myasthenia gravis -seizures -tendon problems -tingling of the fingers or toes, or other nerve disorder -an unusual or allergic reaction to ciprofloxacin, other antibiotics or medicines, foods, dyes, or preservatives -pregnant or trying to get pregnant -breast-feeding How should I use this medicine? Take this medicine by mouth with a full glass of water. Follow the directions on the prescription label. You can take it with or without food. If it upsets your stomach, take it with food. Take your medicine at regular intervals. Do not take your medicine more often than directed. Take all of your medicine as directed even if you think you are better. Do not skip doses or stop your medicine early. Avoid antacids, aluminum, calcium, iron, magnesium, and zinc products for 6 hours before and 2 hours after taking a  dose of this medicine. A special MedGuide will be given to you by the pharmacist with each prescription and refill. Be sure to read this information carefully each time. Talk to your pediatrician regarding the use of this medicine in children. Special care may be needed. Overdosage: If you think you have taken too much of this medicine contact a poison control center or emergency room at once. NOTE: This medicine is only for you. Do not share this medicine with others. What if I miss a dose? If you miss a dose, take it as soon as you can. If it is almost time for your next dose,  take only that dose. Do not take double or extra doses. What may interact with this medicine? Do not take this medicine with any of the following medications: -cisapride -dofetilide -dronedarone -flibanserin -lomitapide -pimozide -thioridazine -tizanidine -ziprasidone This medicine may also interact with the following medications: -antacids -birth control pills -caffeine -certain medicines for diabetes, like glipizide, glyburide, or insulin -certain medicines that treat or prevent blood clots like warfarin -clozapine -cyclosporine -didanosine buffered tablets or powder -duloxetine -lanthanum carbonate -lidocaine -methotrexate -multivitamins -NSAIDS, medicines for pain and inflammation, like ibuprofen or naproxen -olanzapine -omeprazole -other medicines that prolong the QT interval (cause an abnormal heart rhythm) -phenytoin -probenecid -ropinirole -sevelamer -sildenafil -sucralfate -theophylline -zolpidem This list may not describe all possible interactions. Give your health care provider a list of all the medicines, herbs, non-prescription drugs, or dietary supplements you use. Also tell them if you smoke, drink alcohol, or use illegal drugs. Some items may interact with your medicine. What should I watch for while using this medicine? Tell your doctor or healthcare professional if your symptoms do not start to get better or if they get worse. Do not treat diarrhea with over the counter products. Contact your doctor if you have diarrhea that lasts more than 2 days or if it is severe and watery. Check with your doctor or health care professional if you get an attack of severe diarrhea, nausea and vomiting, or if you sweat a lot. The loss of too much body fluid can make it dangerous for you to take this medicine. This medicine may affect blood sugar levels. If you have diabetes, check with your doctor or health care professional before you change your diet or the dose of  your diabetic medicine. You may get drowsy or dizzy. Do not drive, use machinery, or do anything that needs mental alertness until you know how this medicine affects you. Do not sit or stand up quickly, especially if you are an older patient. This reduces the risk of dizzy or fainting spells. This medicine can make you more sensitive to the sun. Keep out of the sun. If you cannot avoid being in the sun, wear protective clothing and use a sunscreen. Do not use sun lamps or tanning beds/booths. What side effects may I notice from receiving this medicine? Side effects that you should report to your doctor or health care professional as soon as possible: -allergic reactions like skin rash or hives, swelling of the face, lips, or tongue -anxious -bloody or watery diarrhea -confusion -depressed mood -fast, irregular heartbeat -fever -hallucination, loss of contact with reality -joint, muscle, or tendon pain or swelling -loss of memory -pain, tingling, numbness in the hands or feet -seizures -signs and symptoms of aortic dissection such as sudden chest, stomach, or back pain -signs and symptoms of high blood sugar such as dizziness; dry mouth; dry skin; fruity breath; nausea; stomach  pain; increased hunger or thirst; increased urination -signs and symptoms of liver injury like dark yellow or brown urine; general ill feeling or flu-like symptoms; light-colored stools; loss of appetite; nausea; right upper belly pain; unusually weak or tired; yellowing of the eyes or skin -signs and symptoms of low blood sugar such as feeling anxious; confusion; dizziness; increased hunger; unusually weak or tired; sweating; shakiness; cold; irritable; headache; blurred vision; fast heartbeat; loss of consciousness; pale skin -suicidal thoughts or other mood changes -sunburn -unusually weak or tired Side effects that usually do not require medical attention (report to your doctor or health care professional if they  continue or are bothersome): -dry mouth -headache -nausea -trouble sleeping This list may not describe all possible side effects. Call your doctor for medical advice about side effects. You may report side effects to FDA at 1-800-FDA-1088. Where should I keep my medicine? Keep out of the reach of children. Store at room temperature below 30 degrees C (86 degrees F). Keep container tightly closed. Throw away any unused medicine after the expiration date. NOTE: This sheet is a summary. It may not cover all possible information. If you have questions about this medicine, talk to your doctor, pharmacist, or health care provider.  2019 Elsevier/Gold Standard (2018-06-04 15:54:38)    Metronidazole tablets or capsules What is this medicine? METRONIDAZOLE (me troe NI da zole) is an antiinfective. It is used to treat certain kinds of bacterial and protozoal infections. It will not work for colds, flu, or other viral infections. This medicine may be used for other purposes; ask your health care provider or pharmacist if you have questions. COMMON BRAND NAME(S): Flagyl What should I tell my health care provider before I take this medicine? They need to know if you have any of these conditions: -Cockayne syndrome -history of blood diseases, like sickle cell anemia or leukemia -history of yeast infection -if you often drink alcohol -liver disease -an unusual or allergic reaction to metronidazole, nitroimidazoles, or other medicines, foods, dyes, or preservatives -pregnant or trying to get pregnant -breast-feeding How should I use this medicine? Take this medicine by mouth with a full glass of water. Follow the directions on the prescription label. Take your medicine at regular intervals. Do not take your medicine more often than directed. Take all of your medicine as directed even if you think you are better. Do not skip doses or stop your medicine early. Talk to your pediatrician regarding the  use of this medicine in children. Special care may be needed. Overdosage: If you think you have taken too much of this medicine contact a poison control center or emergency room at once. NOTE: This medicine is only for you. Do not share this medicine with others. What if I miss a dose? If you miss a dose, take it as soon as you can. If it is almost time for your next dose, take only that dose. Do not take double or extra doses. What may interact with this medicine? Do not take this medicine with any of the following medications: -alcohol or any product that contains alcohol -cisapride -disulfiram -dofetilide -dronedarone -pimozide -thioridazine -ziprasidone This medicine may also interact with the following medications: -amiodarone -birth control pills -busulfan -carbamazepine -cimetidine -cyclosporine -fluorouracil -lithium -other medicines that prolong the QT interval (cause an abnormal heart rhythm) -phenobarbital -phenytoin -quinidine -tacrolimus -vecuronium -warfarin This list may not describe all possible interactions. Give your health care provider a list of all the medicines, herbs, non-prescription drugs, or dietary supplements  you use. Also tell them if you smoke, drink alcohol, or use illegal drugs. Some items may interact with your medicine. What should I watch for while using this medicine? Tell your doctor or health care professional if your symptoms do not improve or if they get worse. You may get drowsy or dizzy. Do not drive, use machinery, or do anything that needs mental alertness until you know how this medicine affects you. Do not stand or sit up quickly, especially if you are an older patient. This reduces the risk of dizzy or fainting spells. Ask your doctor or health care professional if you should avoid alcohol. Many nonprescription cough and cold products contain alcohol. Metronidazole can cause an unpleasant reaction when taken with alcohol. The reaction  includes flushing, headache, nausea, vomiting, sweating, and increased thirst. The reaction can last from 30 minutes to several hours. If you are being treated for a sexually transmitted disease, avoid sexual contact until you have finished your treatment. Your sexual partner may also need treatment. What side effects may I notice from receiving this medicine? Side effects that you should report to your doctor or health care professional as soon as possible: -allergic reactions like skin rash or hives, swelling of the face, lips, or tongue -confusion -fast, irregular heartbeat -fever, chills, sore throat -fever with rash, swollen lymph nodes, or swelling of the face -pain, tingling, numbness in the hands or feet -redness, blistering, peeling or loosening of the skin, including inside the mouth -seizures -sign and symptoms of liver injury like dark yellow or brown urine; general ill feeling or flu-like symptoms; light colored stools; loss of appetite; nausea; right upper belly pain; unusually weak or tired; yellowing of the eyes or skin -vaginal discharge, itching, or odor in women Side effects that usually do not require medical attention (report to your doctor or health care professional if they continue or are bothersome): -changes in taste -diarrhea -headache -nausea, vomiting -stomach pain This list may not describe all possible side effects. Call your doctor for medical advice about side effects. You may report side effects to FDA at 1-800-FDA-1088. Where should I keep my medicine? Keep out of the reach of children. Store at room temperature below 25 degrees C (77 degrees F). Protect from light. Keep container tightly closed. Throw away any unused medicine after the expiration date. NOTE: This sheet is a summary. It may not cover all possible information. If you have questions about this medicine, talk to your doctor, pharmacist, or health care provider.  2019 [employer] Standard  (2017-01-16 20:55:23)

## 2019-01-09 ENCOUNTER — Other Ambulatory Visit: Payer: Self-pay | Admitting: Adult Health

## 2019-01-09 DIAGNOSIS — N183 Chronic kidney disease, stage 3 unspecified: Secondary | ICD-10-CM

## 2019-01-09 DIAGNOSIS — D509 Iron deficiency anemia, unspecified: Secondary | ICD-10-CM

## 2019-01-09 LAB — CBC WITH DIFFERENTIAL/PLATELET
ABSOLUTE MONOCYTES: 576 {cells}/uL (ref 200–950)
Basophils Absolute: 79 cells/uL (ref 0–200)
Basophils Relative: 1.1 %
EOS PCT: 5.5 %
Eosinophils Absolute: 396 cells/uL (ref 15–500)
HCT: 33.5 % — ABNORMAL LOW (ref 35.0–45.0)
HEMOGLOBIN: 11.2 g/dL — AB (ref 11.7–15.5)
LYMPHS ABS: 3096 {cells}/uL (ref 850–3900)
MCH: 31 pg (ref 27.0–33.0)
MCHC: 33.4 g/dL (ref 32.0–36.0)
MCV: 92.8 fL (ref 80.0–100.0)
MONOS PCT: 8 %
MPV: 11.6 fL (ref 7.5–12.5)
Neutro Abs: 3053 cells/uL (ref 1500–7800)
Neutrophils Relative %: 42.4 %
Platelets: 266 10*3/uL (ref 140–400)
RBC: 3.61 10*6/uL — ABNORMAL LOW (ref 3.80–5.10)
RDW: 12.6 % (ref 11.0–15.0)
Total Lymphocyte: 43 %
WBC: 7.2 10*3/uL (ref 3.8–10.8)

## 2019-01-09 LAB — URINALYSIS W MICROSCOPIC + REFLEX CULTURE
BILIRUBIN URINE: NEGATIVE
Bacteria, UA: NONE SEEN /HPF
GLUCOSE, UA: NEGATIVE
HGB URINE DIPSTICK: NEGATIVE
HYALINE CAST: NONE SEEN /LPF
Ketones, ur: NEGATIVE
Leukocyte Esterase: NEGATIVE
Nitrites, Initial: NEGATIVE
PROTEIN: NEGATIVE
RBC / HPF: NONE SEEN /HPF (ref 0–2)
Specific Gravity, Urine: 1.006 (ref 1.001–1.03)
WBC UA: NONE SEEN /HPF (ref 0–5)
pH: 6 (ref 5.0–8.0)

## 2019-01-09 LAB — COMPLETE METABOLIC PANEL WITH GFR
AG Ratio: 1.5 (calc) (ref 1.0–2.5)
ALBUMIN MSPROF: 3.8 g/dL (ref 3.6–5.1)
ALKALINE PHOSPHATASE (APISO): 105 U/L (ref 37–153)
ALT: 38 U/L — ABNORMAL HIGH (ref 6–29)
AST: 38 U/L — ABNORMAL HIGH (ref 10–35)
BUN / CREAT RATIO: 11 (calc) (ref 6–22)
BUN: 11 mg/dL (ref 7–25)
CO2: 27 mmol/L (ref 20–32)
CREATININE: 1.04 mg/dL — AB (ref 0.50–0.99)
Calcium: 9.1 mg/dL (ref 8.6–10.4)
Chloride: 107 mmol/L (ref 98–110)
GFR, EST AFRICAN AMERICAN: 65 mL/min/{1.73_m2} (ref >=60)
GFR, Est Non African American: 56 mL/min/{1.73_m2} — ABNORMAL LOW (ref >=60)
GLUCOSE: 85 mg/dL (ref 65–99)
Globulin: 2.5 g/dL (calc) (ref 1.9–3.7)
Potassium: 4.5 mmol/L (ref 3.5–5.3)
Sodium: 141 mmol/L (ref 135–146)
TOTAL PROTEIN: 6.3 g/dL (ref 6.1–8.1)
Total Bilirubin: 1 mg/dL (ref 0.2–1.2)

## 2019-01-09 LAB — NO CULTURE INDICATED

## 2019-01-12 ENCOUNTER — Ambulatory Visit: Payer: Federal, State, Local not specified - PPO | Admitting: Adult Health

## 2019-01-12 ENCOUNTER — Other Ambulatory Visit: Payer: Self-pay

## 2019-01-12 ENCOUNTER — Encounter: Payer: Self-pay | Admitting: Adult Health

## 2019-01-12 VITALS — BP 136/82 | HR 64 | Temp 97.9°F | Wt 176.2 lb

## 2019-01-12 DIAGNOSIS — D649 Anemia, unspecified: Secondary | ICD-10-CM | POA: Diagnosis not present

## 2019-01-12 DIAGNOSIS — R109 Unspecified abdominal pain: Secondary | ICD-10-CM

## 2019-01-12 MED ORDER — TRAMADOL HCL 50 MG PO TABS: ORAL_TABLET | ORAL | 0 refills | Status: DC

## 2019-01-12 NOTE — Progress Notes (Addendum)
Assessment and Plan:  Tracy Paul was seen today for abdominal pain  Diagnoses and all orders for this visit:  LLQ abdominal pain/History of diverticulosis Complex GI history, poorly explained persistent symptoms  No red flags; intermittent  No improvement with cipro/flagyl for presumed diverticulitis  non-acute exam, don't suspect perforation or other acute complication, ? R/t IBS Recent CT abd/pelvis in 07/2018 generally unremarkable excepting diverticulosis Recheck CBC, CMP, will check lipase/amylase that was not checked last visit Discussed CT vs GI referral for evaluation in light of multiple recent CTs and concern with excessive radiation exposure, doubt ultrasound would reveal relevant information She is in agreement to see GI, defer imaging decision to them if they feel necessary The patient was advised to call immediately if she has any concerning symptoms in the interval. The patient voices understanding of current treatment options and is in agreement with the current care plan.The patient knows to call the clinic with any problems, questions or concerns or go to the ER if any further progression of symptoms.  -     CBC with Differential/Platelet -     COMPLETE METABOLIC PANEL WITH GFR -     Lipase -     Amylase -     Ambulatory referral to Gastroenterology  Anemia, unspecified type -     POC Hemoccult Bld/Stl (3-Cd Home Screen); Future  Other orders -     traMADol (ULTRAM) 50 MG tablet; Take 1 tab up to every 6 hours as needed for severe pain.   Further disposition pending results of labs. Discussed med's effects and SE's.   Over 15 minutes of exam, counseling, chart review, and critical decision making was performed.   Future Appointments  Date Time Provider [place]  01/19/2019 10:00 AM [facility] NURSE [facility] None  03/30/2019 10:30 AM [name], MD [facility] None  08/25/2019 10:00 AM [name], MD [facility] None     ------------------------------------------------------------------------------------------------------------------   HPI BP 136/82   Pulse 64   Temp 97.9 F (36.6 C)   Wt 176 lb 3.2 oz (79.9 kg)   LMP  (LMP Unknown)   SpO2 96%   BMI 31.21 kg/m   68 y.o.female with hx diverticulosis, colonic polyps, diverticulosis of colon, IBS-D, GERD presents for follow up evaluation of intermittent L lower/mid abdominal pain that intermittently ongoing for 3-4 weeks, though increasing in frequency and seems to be getting worse. She first noted this as "aggravating" sensation, now more sharp 7/10, "makes me catch my breath" - she reports episodic, lasting 5-10 min, then may not have for several hours, then may have dull discomfort. No aggravating activities, no relieving activities. Not associated with position or food intake. Does not wake her up at night. She did have a few days of sensation of chills but no notable fever. She denies nausea, vomiting, appetite normal, fever, constipation or diarrhea. Denies urinary changes.  Workup from 1 week ago including CBC, CMP, UA did show show significant leukocytosis, did show a new mild anemia (does have recent hx of iron def anemia, denies notable blood in stool, dark stools), CMP shows mildly increase creatinine 0.86 < 1.04, BUN normal, UA was unremarkable. In light of LLQ pain typical for diverticulitis, recent multiple CT scans, proceeded with presumptive tx for diverticulitis with cipro/flagyl which she is still taking. Reports this did not improve symptoms at all though she continues with abx.   CBC Latest Ref Rng & Units 01/08/2019 11/24/2018 09/03/2018  WBC 3.8 - 10.8 Thousand/uL 7.2 5.6 5.9  Hemoglobin  11.7 - 15.5 g/dL 11.2(L) 12.2 11.8  Hematocrit 35.0 - 45.0 % 33.5(L) 36.8 36.2  Platelets 140 - 400 Thousand/uL 266 215 306     Denies history of renal calculi  She has had tubal ligation, s/p cholecystectomy and gastric bypass.   Colonoscopy  05/28/2018:  - A 3 mm polyp was found in the cecum. The polyp was sessile. The polyp was removed with a cold biopsy forceps. Resection and retrieval were complete. - A 5 mm polyp was found in the splenic flexure. The polyp was sessile. The polyp was removed with a cold snare. Resection and retrieval were complete. - Scattered small and large-mouthed diverticula were found in the sigmoid colon, descending colon, transverse colon, ascending colon and cecum. There was evidence of diverticular spasm. There was evidence of an impacted diverticulum.  CT abd/pelvis 08/12/2018 1.no evidence of enteric or urinary obstruction. No evidence of nephrolithiasis on this noncontrast examination. 2. Sequela of prior gastric Roux-en-Y bypass without evidence of enteric obstruction. 3. Minimal colonic diverticulosis without evidence of diverticulitis. 4.  Aortic Atherosclerosis (ICD10-I70.0).  Past Medical History:  Diagnosis Date  . Allergic rhinitis   . Arthritis   . Cholecystitis, chronic   . Dysrhythmia    Hx bradycardia - hx normal cardiac cath x2 / ECHO 2009 EF 60% LAE MYOVIEW 3/12 EF 60% NO ISCHEMIA  . Hyperlipidemia   . Hypertension    no treatment after 120 lb weight loss  . Hypothyroidism   . Prediabetes   . Thyroid nodule      Allergies  Allergen Reactions  . Codeine Other (See Comments)    "puts me out of it".  Dysphoria.  Patient tolerates Tramadol without complications.  . Nabumetone     GI upset    Current Outpatient Medications on File Prior to Visit  Medication Sig  . alum & mag hydroxide-simeth (MAALOX/MYLANTA) 200-200-20 MG/5ML suspension Take 15 mLs by mouth every 6 (six) hours as needed for indigestion or heartburn.  Marland Kitchen amLODipine (NORVASC) 5 MG tablet Take 1 tablet (5 mg total) by mouth daily.  . Ascorbic Acid (VITAMIN C) 500 MG CAPS Take 1 tablet by mouth daily.   Marland Kitchen aspirin 81 MG tablet Take 81 mg by mouth daily.  Marland Kitchen atorvastatin (LIPITOR) 40 MG tablet Take 1 tablet  (40 mg total) by mouth daily at 6 PM.  . Biotin 10 MG TABS Take 1 tablet (10 mg total) by mouth daily.  . Calcium Citrate-Vitamin D (CALCIUM CITRATE + D3 PO) Take 600 mg by mouth daily.   . ciprofloxacin (CIPRO) 500 MG tablet Take 1 tablet (500 mg total) by mouth 2 (two) times daily.  . diphenhydramine-acetaminophen (TYLENOL PM) 25-500 MG TABS tablet Take 1 tablet by mouth at bedtime.   . Ferrous Sulfate (SLOW RELEASE IRON PO) Take 1 tablet by mouth daily.   . fish oil-omega-3 fatty acids 1000 MG capsule Take 1 g by mouth daily.  . fluticasone (FLONASE) 50 MCG/ACT nasal spray Place 2 sprays into both nostrils daily as needed for allergies.  Marland Kitchen gabapentin (NEURONTIN) 100 MG capsule Take 1 capsule (100 mg total) by mouth 2 (two) times daily.  Marland Kitchen levothyroxine (SYNTHROID, LEVOTHROID) 50 MCG tablet Take 1 tab daily on empty stomach with water only for thyroid.  . Magnesium 250 MG TABS Take 1 tablet by mouth daily.   . Melatonin 10 MG TABS Take 1 tablet by mouth at bedtime.  . meloxicam (MOBIC) 15 MG tablet Take 1 tablet (15 mg total) by mouth  daily as needed for pain.  . metroNIDAZOLE (FLAGYL) 500 MG tablet Take 1 tablet (500 mg total) by mouth 3 (three) times daily.  . nitroGLYCERIN (NITROSTAT) 0.4 MG SL tablet Place 1 tablet (0.4 mg total) under the tongue every 5 (five) minutes as needed for chest pain.  Marland Kitchen ondansetron (ZOFRAN) 4 MG tablet Take 1 tablet (4 mg total) by mouth every 8 (eight) hours as needed for nausea or vomiting.  . pantoprazole (PROTONIX) 40 MG tablet Take 1 tablet (40 mg total) by mouth daily.  . potassium chloride (K-DUR) 10 MEQ tablet Take 1 tablet (10 mEq total) by mouth daily.  . predniSONE (DELTASONE) 20 MG tablet 2 tablets daily for 3 days, 1 tablet daily for 4 days. (Patient taking differently: as needed. 2 tablets daily for 3 days, 1 tablet daily for 4 days.)  . rOPINIRole (REQUIP) 0.5 MG tablet TAKE ONE-HALF (1/2) TABLET AT NIGHT. CAN INCREASE TO TAKE ONE TABLET, THEN MAY  INCREASE UP TO 4 TABLETS AT NIGHT AS DIRECTED. (Patient taking differently: Take 0.5 mg by mouth at bedtime. )  . vitamin B-12 (CYANOCOBALAMIN) 1000 MCG tablet Take 1,000 mcg by mouth daily.   . Vitamin D, Ergocalciferol, (DRISDOL) 50000 units CAPS capsule Take 1 capsule (50,000 Units total) by mouth every other day.   No current facility-administered medications on file prior to visit.     ROS: all negative except above.   Physical Exam:  BP 136/82   Pulse 64   Temp 97.9 F (36.6 C)   Wt 176 lb 3.2 oz (79.9 kg)   LMP  (LMP Unknown)   SpO2 96%   BMI 31.21 kg/m   General Appearance: Well nourished, in no apparent distress. Eyes: PERRLA, conjunctiva no swelling or erythema ENT/Mouth: No erythema, swelling, or exudate on post pharynx.  Tonsils not swollen or erythematous. Hearing normal.  Neck: Supple Respiratory: Respiratory effort normal, BS equal bilaterally without rales, rhonchi, wheezing or stridor.  Cardio: RRR with no MRGs. Brisk peripheral pulses without edema.  Abdomen: Soft, non-distended + BS.  Mild LLQ tenderness, no guarding, rebound, palpable hernias, masses. No CVA tenderness. Lymphatics: Non tender without lymphadenopathy.  Musculoskeletal: Symmetrical strength, normal gait.  Skin: Warm, dry without rashes, lesions, ecchymosis.  Neuro: Normal muscle tone. Psych: Awake and oriented X 3, normal affect, Insight and Judgment appropriate.     Izora Ribas, NP 2:19 PM [facility]

## 2019-01-12 NOTE — Patient Instructions (Signed)

## 2019-01-13 LAB — CBC WITH DIFFERENTIAL/PLATELET
ABSOLUTE MONOCYTES: 640 {cells}/uL (ref 200–950)
BASOS ABS: 13 {cells}/uL (ref 0–200)
Basophils Relative: 0.2 %
EOS ABS: 917 {cells}/uL — AB (ref 15–500)
Eosinophils Relative: 13.9 %
HCT: 35.4 % (ref 35.0–45.0)
HEMOGLOBIN: 11.6 g/dL — AB (ref 11.7–15.5)
Lymphs Abs: 1505 cells/uL (ref 850–3900)
MCH: 30.2 pg (ref 27.0–33.0)
MCHC: 32.8 g/dL (ref 32.0–36.0)
MCV: 92.2 fL (ref 80.0–100.0)
MONOS PCT: 9.7 %
MPV: 11.7 fL (ref 7.5–12.5)
NEUTROS ABS: 3524 {cells}/uL (ref 1500–7800)
Neutrophils Relative %: 53.4 %
Platelets: 248 10*3/uL (ref 140–400)
RBC: 3.84 10*6/uL (ref 3.80–5.10)
RDW: 12.6 % (ref 11.0–15.0)
TOTAL LYMPHOCYTE: 22.8 %
WBC: 6.6 10*3/uL (ref 3.8–10.8)

## 2019-01-13 LAB — COMPLETE METABOLIC PANEL WITHOUT GFR
AG Ratio: 1.7 (calc) (ref 1.0–2.5)
ALT: 37 U/L — ABNORMAL HIGH (ref 6–29)
AST: 49 U/L — ABNORMAL HIGH (ref 10–35)
Albumin: 3.7 g/dL (ref 3.6–5.1)
Alkaline phosphatase (APISO): 108 U/L (ref 37–153)
BUN/Creatinine Ratio: 8 (calc) (ref 6–22)
BUN: 9 mg/dL (ref 7–25)
CO2: 28 mmol/L (ref 20–32)
Calcium: 8.7 mg/dL (ref 8.6–10.4)
Chloride: 109 mmol/L (ref 98–110)
Creat: 1.09 mg/dL — ABNORMAL HIGH (ref 0.50–0.99)
GFR, Est African American: 61 mL/min/1.73m2
GFR, Est Non African American: 53 mL/min/1.73m2 — ABNORMAL LOW
Globulin: 2.2 g/dL (ref 1.9–3.7)
Glucose, Bld: 94 mg/dL (ref 65–99)
Potassium: 4.2 mmol/L (ref 3.5–5.3)
Sodium: 142 mmol/L (ref 135–146)
Total Bilirubin: 1.4 mg/dL — ABNORMAL HIGH (ref 0.2–1.2)
Total Protein: 5.9 g/dL — ABNORMAL LOW (ref 6.1–8.1)

## 2019-01-13 LAB — AMYLASE: Amylase: 42 U/L (ref 21–101)

## 2019-01-13 LAB — LIPASE: Lipase: 31 U/L (ref 7–60)

## 2019-01-14 ENCOUNTER — Other Ambulatory Visit: Payer: Self-pay

## 2019-01-14 MED ORDER — PREDNISONE 10 MG PO TABS: ORAL_TABLET | ORAL | 0 refills | Status: DC

## 2019-01-15 ENCOUNTER — Other Ambulatory Visit: Payer: Self-pay

## 2019-01-15 ENCOUNTER — Telehealth: Payer: Self-pay

## 2019-01-15 NOTE — Progress Notes (Signed)
Cipro and Flagyl discontinued due to rash and itchiness. Prednisone was prescribed instead.

## 2019-01-15 NOTE — Telephone Encounter (Signed)
Started taking the Prednisone but couldn't find the famotidine. She was going out this afternoon and will try to find it elsewhere. Anything else you could suggest for the itchiness?

## 2019-01-15 NOTE — Telephone Encounter (Signed)
Husband, Herbie [place] calling on behalf of patient. States that she is still having a lot of itching. Wants to know if there is a shot or something for the itching, she has already been taking benadryl.

## 2019-01-19 ENCOUNTER — Ambulatory Visit: Payer: Federal, State, Local not specified - PPO

## 2019-01-19 ENCOUNTER — Other Ambulatory Visit: Payer: Self-pay

## 2019-01-19 ENCOUNTER — Encounter: Payer: Self-pay | Admitting: Adult Health

## 2019-01-19 DIAGNOSIS — N183 Chronic kidney disease, stage 3 unspecified: Secondary | ICD-10-CM

## 2019-01-19 DIAGNOSIS — D509 Iron deficiency anemia, unspecified: Secondary | ICD-10-CM

## 2019-01-19 NOTE — Progress Notes (Signed)
Patient presents to the office for a nurse visit to have labs done. Rechecking on kidney function. Patient is not taking any NSAIDS but water intake is minimal. Vitals taken and recorded.

## 2019-01-20 LAB — BASIC METABOLIC PANEL WITH GFR
BUN: 11 mg/dL (ref 7–25)
CALCIUM: 8.9 mg/dL (ref 8.6–10.4)
CHLORIDE: 108 mmol/L (ref 98–110)
CO2: 27 mmol/L (ref 20–32)
Creat: 0.94 mg/dL (ref 0.50–0.99)
GFR, Est African American: 73 mL/min/{1.73_m2} (ref >=60)
GFR, Est Non African American: 63 mL/min/{1.73_m2} (ref >=60)
GLUCOSE: 62 mg/dL — AB (ref 65–99)
POTASSIUM: 4 mmol/L (ref 3.5–5.3)
Sodium: 141 mmol/L (ref 135–146)

## 2019-01-20 LAB — CBC WITH DIFFERENTIAL/PLATELET
ABSOLUTE MONOCYTES: 600 {cells}/uL (ref 200–950)
BASOS PCT: 1.4 %
Basophils Absolute: 122 cells/uL (ref 0–200)
EOS ABS: 539 {cells}/uL — AB (ref 15–500)
Eosinophils Relative: 6.2 %
HEMATOCRIT: 36.5 % (ref 35.0–45.0)
HEMOGLOBIN: 11.8 g/dL (ref 11.7–15.5)
LYMPHS ABS: 4942 {cells}/uL — AB (ref 850–3900)
MCH: 30.1 pg (ref 27.0–33.0)
MCHC: 32.3 g/dL (ref 32.0–36.0)
MCV: 93.1 fL (ref 80.0–100.0)
MPV: 11.4 fL (ref 7.5–12.5)
Monocytes Relative: 6.9 %
Neutro Abs: 2497 cells/uL (ref 1500–7800)
Neutrophils Relative %: 28.7 %
Platelets: 302 10*3/uL (ref 140–400)
RBC: 3.92 10*6/uL (ref 3.80–5.10)
RDW: 12.9 % (ref 11.0–15.0)
Total Lymphocyte: 56.8 %
WBC: 8.7 10*3/uL (ref 3.8–10.8)

## 2019-02-04 ENCOUNTER — Telehealth: Payer: Self-pay

## 2019-02-04 NOTE — Telephone Encounter (Signed)
Patient  had an allergic reaction to her recent meds, since then she is still having flare ups under the skin. Please advise.

## 2019-02-04 NOTE — Telephone Encounter (Signed)
Patient states that nothing has changed with her at all. Intermittent itching. Has done everything suggested except for Aveeno soaks due to not having a bathtub. Really stumped as to what can be causing this.

## 2019-02-05 ENCOUNTER — Other Ambulatory Visit: Payer: Self-pay | Admitting: Adult Health

## 2019-02-05 MED ORDER — HYDROXYZINE HCL 25 MG PO TABS: 25.0000 mg | ORAL_TABLET | Freq: Three times a day (TID) | ORAL | 1 refills | Status: DC | PRN

## 2019-02-05 NOTE — Telephone Encounter (Signed)
Patient states that she is going to continue using the benadryl and would like to try the atarax/vistaril. Please send rx in.

## 2019-02-09 NOTE — Telephone Encounter (Signed)
Patient notified

## 2019-02-13 ENCOUNTER — Ambulatory Visit: Payer: Self-pay | Admitting: Internal Medicine

## 2019-03-04 ENCOUNTER — Ambulatory Visit: Payer: Self-pay | Admitting: Internal Medicine

## 2019-03-29 ENCOUNTER — Other Ambulatory Visit: Payer: Self-pay | Admitting: Internal Medicine

## 2019-03-29 ENCOUNTER — Encounter: Payer: Self-pay | Admitting: Internal Medicine

## 2019-03-29 DIAGNOSIS — K219 Gastro-esophageal reflux disease without esophagitis: Secondary | ICD-10-CM

## 2019-03-29 MED ORDER — RANITIDINE HCL 300 MG PO TABS: ORAL_TABLET | ORAL | 3 refills | Status: DC

## 2019-03-29 NOTE — Patient Instructions (Signed)

## 2019-03-29 NOTE — Progress Notes (Signed)
History of Present Illness:      This very nice 68 y.o. MWF presents for 3 month follow up with HTN, HLD, Hypothyroidism,  Pre-Diabetes and Vitamin D Deficiency. Patient has GERD controlled on her meds.       Patient is treated for HTN (1995) & BP has been controlled at home. She apparently has been off of her Amlodipine & today's BP is elevated at 170/76 and was rechecked at 157/84. Patient has had  2Normal Heart Caths in 1995/2000. Patient has had no complaints of any cardiac type chest pain, palpitations, dyspnea / orthopnea / PND, dizziness, claudication, or dependent edema.      Hyperlipidemia is controlled with diet & Atorvastatin. Patient denies myalgias or other med SE's. 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 She has had no symptoms of reactive hypoglycemia, diabetic polys, paresthesias or visual blurring.  Last A1c was Normal & at goal: Lab Results  Component Value Date   HGBA1C 5.5 08/21/2018      Patient was initiated on Thyroid Replacement in 2008.      Further, the patient also has history of Vitamin D Deficiency ("28" / 2008) and supplements vitamin D without any suspected side-effects. Last vitamin D was at goal: Lab Results  Component Value Date   VD25OH 61 07/31/2018   Current Outpatient Medications on File Prior to Visit  Medication Sig  . Ascorbic Acid (VITAMIN C) 500 MG CAPS Take 1 tablet by mouth daily.   Marland Kitchen aspirin 81 MG tablet Take 81 mg by mouth daily.  . Biotin 10 MG TABS Take 1 tablet (10 mg total) by mouth daily.  . Calcium Citrate-Vitamin D (CALCIUM CITRATE + D3 PO) Take 600 mg by mouth daily.   . diphenhydramine-acetaminophen (TYLENOL PM)  25-500 MG TABS tablet Take 1 tablet by mouth at bedtime.   . Ferrous Sulfate (SLOW RELEASE IRON PO) Take 1 tablet by mouth daily.   . fish oil-omega-3 fatty acids 1000 MG capsule Take 1 g by mouth daily.  . fluticasone (FLONASE) 50 MCG/ACT nasal spray Place 2 sprays into both nostrils daily as needed for allergies.  Marland Kitchen gabapentin (NEURONTIN) 100 MG capsule Take 1 capsule (100 mg total) by mouth 2 (two) times daily.  . hydrOXYzine (ATARAX/VISTARIL) 25 MG tablet Take 1 tablet (25 mg total) by mouth 3 (three) times daily as needed for itching.  . levothyroxine (SYNTHROID, LEVOTHROID) 50 MCG tablet Take 1 tab daily on empty stomach with water only for thyroid.  . Magnesium 250 MG TABS Take 1 tablet by mouth daily.   . Melatonin 10 MG TABS Take 1 tablet by mouth at bedtime.  . meloxicam (MOBIC) 15 MG tablet Take 1 tablet (15 mg total) by mouth daily as needed for pain.  . nitroGLYCERIN (NITROSTAT) 0.4 MG SL tablet Place 1 tablet (0.4 mg total) under the tongue every 5 (five) minutes as needed for chest pain.  Marland Kitchen ondansetron (ZOFRAN) 4 MG tablet Take 1 tablet (4 mg total) by mouth every 8 (eight) hours as needed for nausea or vomiting.  . potassium chloride (K-DUR) 10 MEQ tablet  Take 1 tablet 2 x /day  . traMADol (ULTRAM) 50 MG tablet Take 1 tab up to every 6 hours as needed for severe pain.  . vitamin B-12 (CYANOCOBALAMIN) 1000 MCG tablet Take 1,000 mcg by mouth daily.   . Vitamin D, Ergocalciferol, (DRISDOL) 50000 units CAPS capsule Take 1 capsule (50,000 Units total) by mouth every other day.   No current facility-administered medications on file prior to visit.    Allergies  Allergen Reactions  . Codeine Other (See Comments)    "puts me out of it".  Dysphoria.  Patient tolerates Tramadol without complications.  . Nabumetone     GI upset   PMHx:   Past Medical History:  Diagnosis Date  . Allergic rhinitis   . Arthritis   . Cholecystitis, chronic   . Dysrhythmia    Hx bradycardia - hx  normal cardiac cath x2 / ECHO 2009 EF 60% LAE MYOVIEW 3/12 EF 60% NO ISCHEMIA  . Hyperlipidemia   . Hypertension    no treatment after 120 lb weight loss  . Hypothyroidism   . Prediabetes   . Thyroid nodule    Immunization History  Administered Date(s) Administered  . Influenza Split 08/25/2013, 09/02/2014, 08/10/2015  . Influenza, High Dose Seasonal PF 07/31/2017, 08/20/2018  . Influenza,inj,quad, With Preservative 08/10/2016  . PPD Test 02/23/2014, 05/23/2016  . Pneumococcal Conjugate-13 07/31/2018  . Pneumococcal Polysaccharide-23 10/18/2008  . Td 08/08/2006  . Tdap 05/23/2016  . Zoster 10/15/2012   Past Surgical History:  Procedure Laterality Date  . Bilateral carpal tunnel surgery    . BREATH TEK H PYLORI  07/01/2012   Procedure: BREATH TEK H PYLORI;  Surgeon: Edward Jolly, MD;  Location: [facility];  Service: General;  Laterality: N/A;  . CHOLECYSTECTOMY N/A 11/27/2013   Procedure: LAPAROSCOPIC CHOLECYSTECTOMY WITH INTRAOPERATIVE CHOLANGIOGRAM;  Surgeon: Pedro Earls, MD;  Location: [facility];  Service: General;  Laterality: N/A;  . GASTRIC ROUX-EN-Y N/A 12/16/2012   Procedure: LAPAROSCOPIC ROUX-EN-Y GASTRIC BYPASS WITH UPPER ENDOSCOPY;  Surgeon: Pedro Earls, MD;  Location: [facility];  Service: General;  Laterality: N/A;  Gastric Bypass  . NASAL SINUS SURGERY    . REPLACEMENT TOTAL KNEE BILATERAL     bilat  . TUBAL LIGATION     FHx:    Reviewed / unchanged  SHx:    Reviewed / unchanged   Systems Review:  Constitutional: Denies fever, chills, wt changes, headaches, insomnia, fatigue, night sweats, change in appetite. Eyes: Denies redness, blurred vision, diplopia, discharge, itchy, watery eyes.  ENT: Denies discharge, congestion, post nasal drip, epistaxis, sore throat, earache, hearing loss, dental pain, tinnitus, vertigo, sinus pain, snoring.  CV: Denies chest pain, palpitations, irregular heartbeat, syncope, dyspnea, diaphoresis, orthopnea, PND, claudication  or edema. Respiratory: denies cough, dyspnea, DOE, pleurisy, hoarseness, laryngitis, wheezing.  Gastrointestinal: Denies dysphagia, odynophagia, heartburn, reflux, water brash, abdominal pain or cramps, nausea, vomiting, bloating, diarrhea, constipation, hematemesis, melena, hematochezia  or hemorrhoids. Genitourinary: Denies dysuria, frequency, urgency, nocturia, hesitancy, discharge, hematuria or flank pain. Musculoskeletal: Denies arthralgias, myalgias, stiffness, jt. swelling, pain, limping or strain/sprain.  Skin: Denies pruritus, rash, hives, warts, acne, eczema or change in skin lesion(s). Neuro: No weakness, tremor, incoordination, spasms, paresthesia or pain. Psychiatric: Denies confusion, memory loss or sensory loss. Endo: Denies change in weight, skin or hair change.  Heme/Lymph: No excessive bleeding, bruising or enlarged lymph nodes.  Physical Exam  BP  170/76 recheck 157/84   P 60   T 97.2 F    R 16  Ht 5\' 3"     Wt 174 lb 8 oz BMI 30.91   Appears  well nourished, well groomed  and in no distress.  Eyes: PERRLA, EOMs, conjunctiva no swelling or erythema. Sinuses: No frontal/maxillary tenderness ENT/Mouth: EAC's clear, TM's nl w/o erythema, bulging. Nares clear w/o erythema, swelling, exudates. Oropharynx clear without erythema or exudates. Oral hygiene is good. Tongue normal, non obstructing. Hearing intact.  Neck: Supple. Thyroid not palpable. Car 2+/2+ without bruits, nodes or JVD. Chest: Respirations nl with BS clear & equal w/o rales, rhonchi, wheezing or stridor.  Cor: Heart sounds normal w/ regular rate and rhythm without sig. murmurs, gallops, clicks or rubs. Peripheral pulses normal and equal  without edema.  Abdomen: Soft & bowel sounds normal. Non-tender w/o guarding, rebound, hernias, masses or organomegaly.  Lymphatics: Unremarkable.  Musculoskeletal: Full ROM all peripheral extremities, joint stability, 5/5 strength and normal gait.  Skin: Warm, dry without  exposed rashes, lesions or ecchymosis apparent.  Neuro: Cranial nerves intact, reflexes equal bilaterally. Sensory-motor testing grossly intact. Tendon reflexes grossly intact.  Pysch: Alert & oriented x 3.  Insight and judgement nl & appropriate. No ideations.  Assessment and Plan:  1. Essential hypertension  - Continue medication, monitor blood pressure at home.  - Continue DASH diet.  Reminder to go to the ER if any CP,  SOB, nausea, dizziness, severe HA, changes vision/speech.  - CBC with Differential/Platelet - COMPLETE METABOLIC PANEL WITH GFR - Magnesium - TSH  2. Hyperlipidemia, mixed  - Continue diet/meds, exercise,& lifestyle modifications.  - Continue monitor periodic cholesterol/liver & renal functions   - Lipid panel - TSH  3. Abnormal glucose  - Continue diet, exercise  - Lifestyle modifications.  - Monitor appropriate labs.  - Hemoglobin A1c - Insulin, random  4. Vitamin D deficiency  - Continue supplementation.   - VITAMIN D 25 Hydroxyl  5. Hypothyroidism  - TSH  6. Gastroesophageal reflux disease  - CBC with Differential/Platelet  7. Medication management  - CBC with Differential/Platelet - COMPLETE METABOLIC PANEL WITH GFR - Magnesium - Lipid panel - TSH - Hemoglobin A1c - Insulin, random - VITAMIN D 25 Hydroxyl       Discussed  regular exercise, BP monitoring, weight control to achieve/maintain BMI less than 25 and discussed med and SE's. Recommended labs to assess and monitor clinical status with further disposition pending results of labs. I discussed the assessment and treatment plan with the patient. The patient was provided an opportunity to ask questions and all were answered. The patient agreed with the plan and demonstrated an understanding of the instructions. I provided  minutes of non-face-to-face time during this encounter and over 25 minutes of exam, counseling, chart review and  complex critical decision making was performed    Kirtland Bouchard, MD

## 2019-03-30 ENCOUNTER — Other Ambulatory Visit: Payer: Self-pay

## 2019-03-30 ENCOUNTER — Ambulatory Visit: Payer: Federal, State, Local not specified - PPO | Admitting: Internal Medicine

## 2019-03-30 VITALS — BP 170/76 | HR 60 | Temp 97.2°F | Resp 16 | Ht 63.0 in | Wt 174.5 lb

## 2019-03-30 DIAGNOSIS — E039 Hypothyroidism, unspecified: Secondary | ICD-10-CM | POA: Diagnosis not present

## 2019-03-30 DIAGNOSIS — I10 Essential (primary) hypertension: Secondary | ICD-10-CM

## 2019-03-30 DIAGNOSIS — E559 Vitamin D deficiency, unspecified: Secondary | ICD-10-CM

## 2019-03-30 DIAGNOSIS — K219 Gastro-esophageal reflux disease without esophagitis: Secondary | ICD-10-CM

## 2019-03-30 DIAGNOSIS — E782 Mixed hyperlipidemia: Secondary | ICD-10-CM

## 2019-03-30 DIAGNOSIS — Z79899 Other long term (current) drug therapy: Secondary | ICD-10-CM

## 2019-03-30 DIAGNOSIS — R7309 Other abnormal glucose: Secondary | ICD-10-CM | POA: Diagnosis not present

## 2019-03-30 DIAGNOSIS — E785 Hyperlipidemia, unspecified: Secondary | ICD-10-CM

## 2019-03-30 MED ORDER — ATORVASTATIN CALCIUM 40 MG PO TABS: 40.0000 mg | ORAL_TABLET | Freq: Every day | ORAL | 1 refills | Status: DC

## 2019-03-30 MED ORDER — OLMESARTAN MEDOXOMIL-HCTZ 20-12.5 MG PO TABS: ORAL_TABLET | ORAL | 3 refills | Status: DC

## 2019-03-30 MED ORDER — ROPINIROLE HCL 0.5 MG PO TABS: ORAL_TABLET | ORAL | 3 refills | Status: DC

## 2019-03-30 MED ORDER — FAMOTIDINE 20 MG PO TABS: ORAL_TABLET | ORAL | 3 refills | Status: DC

## 2019-03-31 LAB — CBC WITH DIFFERENTIAL/PLATELET
Absolute Monocytes: 380 cells/uL (ref 200–950)
Basophils Absolute: 60 cells/uL (ref 0–200)
Basophils Relative: 1.2 %
Eosinophils Absolute: 350 cells/uL (ref 15–500)
Eosinophils Relative: 7 %
HCT: 35.7 % (ref 35.0–45.0)
Hemoglobin: 11.8 g/dL (ref 11.7–15.5)
Lymphs Abs: 2215 cells/uL (ref 850–3900)
MCH: 30.4 pg (ref 27.0–33.0)
MCHC: 33.1 g/dL (ref 32.0–36.0)
MCV: 92 fL (ref 80.0–100.0)
MPV: 11.8 fL (ref 7.5–12.5)
Monocytes Relative: 7.6 %
Neutro Abs: 1995 cells/uL (ref 1500–7800)
Neutrophils Relative %: 39.9 %
Platelets: 245 10*3/uL (ref 140–400)
RBC: 3.88 10*6/uL (ref 3.80–5.10)
RDW: 12.4 % (ref 11.0–15.0)
Total Lymphocyte: 44.3 %
WBC: 5 10*3/uL (ref 3.8–10.8)

## 2019-03-31 LAB — HEMOGLOBIN A1C
Hgb A1c MFr Bld: 5.6 % of total Hgb (ref <5.7)
Mean Plasma Glucose: 114 (calc)
eAG (mmol/L): 6.3 (calc)

## 2019-03-31 LAB — LIPID PANEL
Cholesterol: 93 mg/dL (ref <200)
HDL: 51 mg/dL (ref >=50)
LDL Cholesterol (Calc): 31 mg/dL (calc)
Non-HDL Cholesterol (Calc): 42 mg/dL (calc) (ref <130)
Total CHOL/HDL Ratio: 1.8 (calc) (ref <5.0)
Triglycerides: 39 mg/dL (ref <150)

## 2019-03-31 LAB — COMPLETE METABOLIC PANEL WITH GFR
AG Ratio: 1.5 (calc) (ref 1.0–2.5)
ALT: 41 U/L — ABNORMAL HIGH (ref 6–29)
AST: 46 U/L — ABNORMAL HIGH (ref 10–35)
Albumin: 3.7 g/dL (ref 3.6–5.1)
Alkaline phosphatase (APISO): 121 U/L (ref 37–153)
BUN: 8 mg/dL (ref 7–25)
CO2: 29 mmol/L (ref 20–32)
Calcium: 9 mg/dL (ref 8.6–10.4)
Chloride: 107 mmol/L (ref 98–110)
Creat: 0.95 mg/dL (ref 0.50–0.99)
GFR, Est African American: 72 mL/min/{1.73_m2} (ref >=60)
GFR, Est Non African American: 62 mL/min/{1.73_m2} (ref >=60)
Globulin: 2.4 g/dL (calc) (ref 1.9–3.7)
Glucose, Bld: 93 mg/dL (ref 65–99)
Potassium: 4.5 mmol/L (ref 3.5–5.3)
Sodium: 141 mmol/L (ref 135–146)
Total Bilirubin: 1.3 mg/dL — ABNORMAL HIGH (ref 0.2–1.2)
Total Protein: 6.1 g/dL (ref 6.1–8.1)

## 2019-03-31 LAB — MAGNESIUM: Magnesium: 2 mg/dL (ref 1.5–2.5)

## 2019-03-31 LAB — INSULIN, RANDOM: Insulin: 4.5 u[IU]/mL

## 2019-03-31 LAB — VITAMIN D 25 HYDROXY (VIT D DEFICIENCY, FRACTURES): Vit D, 25-Hydroxy: 55 ng/mL (ref 30–100)

## 2019-03-31 LAB — TSH: TSH: 1.2 mIU/L (ref 0.40–4.50)

## 2019-04-03 ENCOUNTER — Other Ambulatory Visit: Payer: Self-pay | Admitting: Internal Medicine

## 2019-04-03 DIAGNOSIS — E785 Hyperlipidemia, unspecified: Secondary | ICD-10-CM

## 2019-04-03 MED ORDER — ATORVASTATIN CALCIUM 40 MG PO TABS: ORAL_TABLET | ORAL | 3 refills | Status: DC

## 2019-04-13 NOTE — Progress Notes (Signed)
Virtual Visit via Telephone Note  I connected with Tracy Paul on 04/14/19 at 12:00 PM EDT by telephone and verified that I am speaking with the correct person using two identifiers.   I discussed the limitations, risks, security and privacy concerns of performing an evaluation and management service by telephone and the availability of in person appointments. I also discussed with the patient that there may be a patient responsible charge related to this service. The patient expressed understanding and agreed to proceed.  Assessment and Plan:  Tracy Paul was seen today for rash.  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 -Rx      predniSONE (STERAPRED UNI-PAK 21 TAB) 10 MG (21) TBPK tablet; Take by mouth daily. Follow directions on Taper pack  Pruritus Continue atarax and benadryl PRN  Medication management continued   Follow Up Instructions:  Discussed assessment and treatment plan with the patient. The patient was provided an opportunity to ask questions and all were answered. The patient agrees with the plan of care and demonstrates an understanding of the instructions.   The patient was advised to call back or seek an in-person evaluation if the symptoms worsen or if the condition fails to improve as anticipated.  I provided 30 minutes of non-face-to-face time during this encounter including interview, counseling, chart review, and critical decision making was preformed.     Future Appointments  Date Time Provider [place]  04/14/2019 12:00 PM Garnet Sierras, NP [facility] None  07/08/2019  9:30 AM Vicie Mutters, PA-C [facility] None  10/08/2019 11:00 AM [name], MD [facility] None    ------------------------------------------------------------------------------------------------------------------   HPI 68 y.o.female  presents for referral for chronic skin rash.  Reports two months ago she was treated for diverticulitis and she broke out with a rash and stopped the medication.  She was prescribed Cipro and flagyl.  She has tolerated Cipro in the past and likely from flagyl. She reports lumps under her skin, they start itching and she can't help but scratch.  She has them on her arms legs abdomen, face, all over.  The erupt randomly and last about 3-4 days, then slowly resolve.  Denies any vesicles or open areas.  She has been taking benadryl when she has the itching, twice a day.  She has also been taking hydroxyzine three times a day, but she still has itching. She has not used any topical preparations.   She has been monitoring her diet for triggers.   No new medications and no new lifestyle changes.    She also has history of HTN. HLD, glucose management, weight S/P RYGB, GERD, hiatial hernia, vitamin D deficency.  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  Past Medical History:  Diagnosis Date  . Allergic rhinitis   . Arthritis   . Cholecystitis, chronic   . Dysrhythmia    Hx bradycardia - hx normal cardiac cath x2 / ECHO 2009 EF 60% LAE MYOVIEW 3/12 EF 60% NO ISCHEMIA  . Hyperlipidemia   . Hypertension    no treatment after 120 lb weight loss  . Hypothyroidism   . Prediabetes   . Thyroid nodule      Allergies  Allergen Reactions  . Codeine Other (See Comments)    "puts me out of it".  Dysphoria.  Patient  tolerates Tramadol without complications.  . Nabumetone     GI upset    Current Outpatient Medications on File Prior to Visit  Medication Sig  . Ascorbic Acid (VITAMIN C) 500 MG CAPS Take 1 tablet by mouth daily.   Marland Kitchen aspirin 81 MG tablet Take 81 mg by mouth daily.  Marland Kitchen atorvastatin (LIPITOR) 40 MG tablet Take 1 tablet Daily for Cholesterol  . Biotin 10 MG TABS Take 1 tablet (10 mg total) by mouth daily.  . Calcium  Citrate-Vitamin D (CALCIUM CITRATE + D3 PO) Take 600 mg by mouth daily.   . diphenhydramine-acetaminophen (TYLENOL PM) 25-500 MG TABS tablet Take 1 tablet by mouth at bedtime.   . famotidine (PEPCID) 20 MG tablet Take 1 tablet 2 x /day with meals if needed  for Acid Indigestion  . Ferrous Sulfate (SLOW RELEASE IRON PO) Take 1 tablet by mouth daily.   . fish oil-omega-3 fatty acids 1000 MG capsule Take 1 g by mouth daily.  . fluticasone (FLONASE) 50 MCG/ACT nasal spray Place 2 sprays into both nostrils daily as needed for allergies.  Marland Kitchen gabapentin (NEURONTIN) 100 MG capsule Take 1 capsule (100 mg total) by mouth 2 (two) times daily.  . hydrOXYzine (ATARAX/VISTARIL) 25 MG tablet Take 1 tablet (25 mg total) by mouth 3 (three) times daily as needed for itching.  . levothyroxine (SYNTHROID, LEVOTHROID) 50 MCG tablet Take 1 tab daily on empty stomach with water only for thyroid.  . Magnesium 250 MG TABS Take 1 tablet by mouth daily.   . Melatonin 10 MG TABS Take 1 tablet by mouth at bedtime.  . meloxicam (MOBIC) 15 MG tablet Take 1 tablet (15 mg total) by mouth daily as needed for pain.  . nitroGLYCERIN (NITROSTAT) 0.4 MG SL tablet Place 1 tablet (0.4 mg total) under the tongue every 5 (five) minutes as needed for chest pain.  Marland Kitchen olmesartan-hydrochlorothiazide (BENICAR HCT) 20-12.5 MG tablet Take 1 tablet Daily for BP  . potassium chloride (K-DUR) 10 MEQ tablet Take 1 tablet 2 x /day  . rOPINIRole (REQUIP) 0.5 MG tablet Take 1 tablet at Bedtime for Restless Legs  . vitamin B-12 (CYANOCOBALAMIN) 1000 MCG tablet Take 1,000 mcg by mouth daily.   . Vitamin D, Ergocalciferol, (DRISDOL) 50000 units CAPS capsule Take 1 capsule (50,000 Units total) by mouth every other day.   No current facility-administered medications on file prior to visit.     ROS: all negative except above.   Physical Exam: General : Well sounding patient in no apparent distress HEENT: no hoarseness, no cough for duration of  visit Lungs: speaks in complete sentences, no audible wheezing, no apparent distress Neurological: alert, oriented x 3 Psychiatric: pleasant, judgement appropriate    Garnet Sierras, NP 12:00 AM [facility]

## 2019-04-14 ENCOUNTER — Other Ambulatory Visit: Payer: Self-pay

## 2019-04-14 ENCOUNTER — Ambulatory Visit: Payer: Federal, State, Local not specified - PPO | Admitting: Adult Health Nurse Practitioner

## 2019-04-14 ENCOUNTER — Encounter: Payer: Self-pay | Admitting: Adult Health Nurse Practitioner

## 2019-04-14 DIAGNOSIS — Z79899 Other long term (current) drug therapy: Secondary | ICD-10-CM

## 2019-04-14 DIAGNOSIS — L299 Pruritus, unspecified: Secondary | ICD-10-CM

## 2019-04-14 DIAGNOSIS — I2510 Atherosclerotic heart disease of native coronary artery without angina pectoris: Secondary | ICD-10-CM | POA: Diagnosis not present

## 2019-04-14 DIAGNOSIS — L270 Generalized skin eruption due to drugs and medicaments taken internally: Secondary | ICD-10-CM | POA: Diagnosis not present

## 2019-04-14 MED ORDER — PREDNISONE 10 MG (21) PO TBPK: ORAL_TABLET | Freq: Every day | ORAL | 0 refills | Status: DC

## 2019-04-14 NOTE — Patient Instructions (Signed)
Sent in Prescription for Prednisone taper pack related variation in eruption sites.  Consider making appointment with Dermatology.  Continue to monitor dietary and life styles changes.  Call or return with new or worsening symptoms.

## 2019-04-20 ENCOUNTER — Telehealth: Payer: Self-pay | Admitting: *Deleted

## 2019-04-20 NOTE — Telephone Encounter (Signed)
Patient called and asked about start date of Levothyroxine 50 mcg.  She was asking if dose change could be causing her intense itching.  The medication was sent in on 11/25/2018 and the itching started much later.  The patient has a dermatology appointment in [place], [place] on 04/22/2019.

## 2019-04-20 NOTE — Telephone Encounter (Signed)
error 

## 2019-04-29 ENCOUNTER — Ambulatory Visit: Payer: Federal, State, Local not specified - PPO | Admitting: Internal Medicine

## 2019-04-29 ENCOUNTER — Other Ambulatory Visit: Payer: Self-pay

## 2019-04-29 ENCOUNTER — Encounter: Payer: Self-pay | Admitting: Internal Medicine

## 2019-04-29 VITALS — BP 130/82 | HR 64 | Temp 97.4°F | Resp 16 | Ht 63.5 in | Wt 175.6 lb

## 2019-04-29 DIAGNOSIS — N3000 Acute cystitis without hematuria: Secondary | ICD-10-CM | POA: Diagnosis not present

## 2019-04-29 MED ORDER — CIPROFLOXACIN HCL 500 MG PO TABS: ORAL_TABLET | ORAL | 0 refills | Status: DC

## 2019-04-29 NOTE — Progress Notes (Signed)
Subjective:    Patient ID: Tracy Paul, female    DOB: 23-May-1951, 68 y.o.   MRN: [id number]  HPI     This very nice 68 y.o. MWF with HTN, HLD, hx/o [id number]  and Vitamin D Deficiency presents c/o urinary frequency & urgency. Denies fever, chills, sweats, rash or respiratory sx's.   Medication Sig  . Ascorbic Acid (VITAMIN C) 500 MG CAPS Take 1 tablet by mouth daily.   Marland Kitchen aspirin 81 MG tablet Take 81 mg by mouth daily.  Marland Kitchen atorvastatin (LIPITOR) 40 MG tablet Take 1 tablet Daily for Cholesterol  . Biotin 10 MG TABS Take 1 tablet (10 mg total) by mouth daily.  . Calcium Citrate-Vitamin D (CALCIUM CITRATE + D3 PO) Take 600 mg by mouth daily.   . diphenhydramine-acetaminophen (TYLENOL PM) 25-500 MG TABS tablet Take 1 tablet by mouth at bedtime.   . famotidine (PEPCID) 20 MG tablet Take 1 tablet 2 x /day with meals if needed  for Acid Indigestion  . Ferrous Sulfate (SLOW RELEASE IRON PO) Take 1 tablet by mouth daily.   . fish oil-omega-3 fatty acids 1000 MG capsule Take 1 g by mouth daily.  . fluticasone (FLONASE) 50 MCG/ACT nasal spray Place 2 sprays into both nostrils daily as needed for allergies.  Marland Kitchen gabapentin (NEURONTIN) 100 MG capsule Take 1 capsule (100 mg total) by mouth 2 (two) times daily.  . hydrOXYzine (ATARAX/VISTARIL) 25 MG tablet Take 1 tablet (25 mg total) by mouth 3 (three) times daily as needed for itching.  . levothyroxine (SYNTHROID, LEVOTHROID) 50 MCG tablet Take 1 tab daily on empty stomach with water only for thyroid.  . Magnesium 250 MG TABS Take 1 tablet by mouth daily.   . Melatonin 10 MG TABS Take 1 tablet by mouth at bedtime.  . meloxicam (MOBIC) 15 MG tablet Take 1 tablet (15 mg total) by mouth daily as needed for pain.  . nitroGLYCERIN (NITROSTAT) 0.4 MG SL tablet Place 1 tablet (0.4 mg total) under the tongue every 5 (five) minutes as needed for chest pain.  Marland Kitchen olmesartan-hydrochlorothiazide (BENICAR HCT) 20-12.5 MG tablet Take 1 tablet Daily for BP  .  potassium chloride (K-DUR) 10 MEQ tablet Take 1 tablet 2 x /day  . rOPINIRole (REQUIP) 0.5 MG tablet Take 1 tablet at Bedtime for Restless Legs  . vitamin B-12 (CYANOCOBALAMIN) 1000 MCG tablet Take 1,000 mcg by mouth daily.   . Vitamin D, Ergocalciferol, (DRISDOL) 50000 units CAPS capsule Take 1 capsule (50,000 Units total) by mouth every other day.  . predniSONE (STERAPRED UNI-PAK 21 TAB) 10 MG (21) TBPK tablet Take by mouth daily. Follow directions on Taper pack   Allergies  Allergen Reactions  . Codeine Other (See Comments)    "puts me out of it".  Dysphoria.  Patient tolerates Tramadol without complications.  . Flagyl [Metronidazole] Hives  . Nabumetone     GI upset   Past Medical History:  Diagnosis Date  . Allergic rhinitis   . Arthritis   . Cholecystitis, chronic   . Dysrhythmia    Hx bradycardia - hx normal cardiac cath x2 / ECHO 2009 EF 60% LAE MYOVIEW 3/12 EF 60% NO ISCHEMIA  . Hyperlipidemia   . Hypertension    no treatment after 120 lb weight loss  . Hypothyroidism   . Prediabetes   . Thyroid nodule    Review of Systems   10 point systems review negative except as above.    Objective:   Physical Exam  BP 130/82   Pulse 64   Temp (!) 97.4 F (36.3 C)   Resp 16   Ht 5' 3.5" (1.613 m)   Wt 175 lb 9.6 oz (79.7 kg)   LMP  (LMP Unknown)   BMI 30.62 kg/m   HEENT - WNL. Neck - supple.  Chest - Clear equal BS. Cor - Nl HS. RRR w/o sig MGR. PP 1(+). No edema. MS- FROM w/o deformities.  Gait Nl. Neuro -  Nl w/o focal abnormalities.    Assessment & Plan:    1. Acute cystitis without hematuria  - Urinalysis, Routine w reflex microscopic - Urine Culture  - ciprofloxacin (CIPRO) 500 MG tablet; Take 1 tablet 2 x /day with meal for UTI  Dispense: 20 tablet; Refill: 0

## 2019-04-29 NOTE — Patient Instructions (Signed)
Acute Urinary Retention, Female  Acute urinary retention means that you cannot pee (urinate) at all, or that you pee too little and your bladder is not emptied completely. If it is not treated, it can lead to kidney damage or other serious problems. Follow these instructions at home:  Take over-the-counter and prescription medicines only as told by your doctor. Ask your doctor what medicines you should stay away from. Do not take any medicine unless your doctor says it is okay to do so.  If you were sent home with a tube that drains pee from the bladder (catheter), take care of it as told by your doctor.  Drink enough fluid to keep your pee clear or pale yellow.  If you were given an antibiotic, take it as told by your doctor. Do not stop taking the antibiotic even if you start to feel better.  Do not use any products that contain nicotine or tobacco, such as cigarettes and e-cigarettes. If you need help quitting, ask your doctor.  Watch for changes in your symptoms. Tell your doctor about them.  If told, keep track of any changes in your blood pressure at home. Tell your doctor about them.  Keep all follow-up visits as told by your doctor. This is important. Contact a doctor if:  You have spasms or you leak pee when you have spasms. Get help right away if:  You have chills or a fever.  You have blood in your pee.  You have a tube that drains the bladder and: ? The tube stops draining pee. ? The tube falls out. Summary  Acute urinary retention means that you cannot pee at all, or that you pee too little and your bladder is not emptied completely. If it is not treated, it can result in kidney damage or other serious problems.  If you were sent home with a tube that drains pee from the bladder, take care of it as told by your doctor.  Pay attention to any changes in your symptoms. Tell your doctor about them. This information is not intended to replace advice given to you by your  health care provider. Make sure you discuss any questions you have with your health care provider. Document Released: 04/02/2008 Document Revised: 09/27/2017 Document Reviewed: 11/16/2016 Elsevier Patient Education  2020 [employer] Inc[employer]  

## 2019-05-01 ENCOUNTER — Other Ambulatory Visit: Payer: Self-pay | Admitting: Internal Medicine

## 2019-05-01 DIAGNOSIS — N3000 Acute cystitis without hematuria: Secondary | ICD-10-CM

## 2019-05-01 LAB — URINALYSIS, ROUTINE W REFLEX MICROSCOPIC
Bilirubin Urine: NEGATIVE
Glucose, UA: NEGATIVE
Hyaline Cast: NONE SEEN /LPF
Ketones, ur: NEGATIVE
Nitrite: NEGATIVE
Protein, ur: NEGATIVE
Specific Gravity, Urine: 1.008 (ref 1.001–1.03)
Squamous Epithelial / HPF: NONE SEEN /HPF (ref <=5)
pH: 7 (ref 5.0–8.0)

## 2019-05-01 LAB — URINE CULTURE
MICRO NUMBER:: 627024
SPECIMEN QUALITY:: ADEQUATE

## 2019-06-17 DIAGNOSIS — N3000 Acute cystitis without hematuria: Secondary | ICD-10-CM | POA: Diagnosis not present

## 2019-06-30 ENCOUNTER — Other Ambulatory Visit: Payer: Self-pay | Admitting: *Deleted

## 2019-06-30 MED ORDER — LEVOTHYROXINE SODIUM 50 MCG PO TABS: ORAL_TABLET | ORAL | 1 refills | Status: DC

## 2019-07-02 NOTE — Progress Notes (Signed)
FOLLOW UP  Assessment and Plan: Essential hypertension -     CBC with Differential/Platelet -     COMPLETE METABOLIC PANEL WITH GFR -     TSH - continue medications, DASH diet, exercise and monitor at home. Call if greater than 130/80.   Aortic atherosclerosis (HCC) Control blood pressure, cholesterol, glucose, increase exercise.   Hypothyroidism, unspecified type -     TSH Hypothyroidism-check TSH level, continue medications the same, reminded to take on an empty stomach 30-54mins before food.   Hyperlipidemia LDL goal <70 -     Lipid panel check lipids decrease fatty foods increase activity.   Medication management -     Magnesium  Vitamin D deficiency -     VITAMIN D 25 Hydroxy (Vit-D Deficiency, Fractures)  Abnormal glucose -     Hemoglobin A1c Discussed general issues about diabetes pathophysiology and management., Educational material distributed., Suggested low cholesterol diet., Encouraged aerobic exercise., Discussed foot care., Reminded to get yearly retinal exam.  Rash and nonspecific skin eruption X 6 months Will get labs to rule out autoimmune If not better get biopsy/refer different derm -     Sedimentation rate -     ANA -     Anti-DNA antibody, double-stranded -     Rheumatoid factor -     Cyclic citrul peptide antibody, IgG -     C3 and C4 -     ANCA screen with reflex titer -     Anti-Smith antibody -     Centromere Antibodies -     RNP Antibody -     predniSONE (DELTASONE) 20 MG tablet; 1 pill 3 x a day for 3 days, 1 pill 2 x a day x 6 days, 1 pill a day x 10 days, 1/2 pill a day for 10 days, 1/2 pill every other day for 10 days     Continue diet and meds as discussed. Further disposition pending results of labs. Discussed med's effects and SE's.   Over 30 minutes of exam, counseling, chart review, and critical decision making was performed.   Future Appointments  Date Time Provider [place]  10/08/2019 11:00 AM [name],  MD [facility] None    ----------------------------------------------------------------------------------------------------------------------  HPI 68 y.o. female  presents for 3 month follow up on hypertension, cholesterol, glucose management, weight (s/p roux-en-y) and vitamin D deficiency.   She has had rash x 6 months, states started after a medication for diverticulitis. Has seen a dermatologist in [place] and allergy doctor. No one else in the house has this rash. She has changed around her laundry. Very pruritic, will drive her crazy occ it will get so bad.  On bilateral arms, back and chest. Not on legs. She is on hydroxyzine 3 x a day. Prednisone helped it but it came back.   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  She has hx of GERD/hiatal hernia, was diagnosed with H.pylori + ulcer 12/17, and again in 09/2017. She had EGD in 02/2018 which was unremarkable with neg H. Pylori. she is currently managed by 40 mg protonix daily, PRN mylanta, using a few times a month.    BMI is Body mass index is 31.21 kg/m., she has been working on diet and exercise, watching portions, cutting back on sweet tea. She is active but not intentionally exercising. Wt Readings from Last  3 Encounters:  07/08/19 176 lb 3.2 oz (79.9 kg)  04/29/19 175 lb 9.6 oz (79.7 kg)  03/30/19 174 lb 8 oz (79.2 kg)   Her blood pressure has been controlled at home, today their BP is BP: 118/78  She does workout. She denies chest pain, shortness of breath, dizziness.   She is on cholesterol medication (atorvastatin 40 mg daily) and denies myalgias. Her cholesterol is at goal. The cholesterol last visit was:   Lab Results  Component Value Date   CHOL 93 03/30/2019   HDL 51 03/30/2019   LDLCALC 31 03/30/2019   TRIG 39 03/30/2019   CHOLHDL 1.8 03/30/2019    She has been  working on diet and exercise for glucose management, and denies increased appetite, nausea, paresthesia of the feet, polydipsia, polyuria, visual disturbances, vomiting and weight loss. Last A1C in the office was:  Lab Results  Component Value Date   HGBA1C 5.6 03/30/2019   She is on thyroid medication. Her medication was not changed last visit.   Lab Results  Component Value Date   TSH 1.20 03/30/2019   Patient is on Vitamin D supplement.   Lab Results  Component Value Date   VD25OH 55 03/30/2019        Current Medications:  Current Outpatient Medications on File Prior to Visit  Medication Sig  . Ascorbic Acid (VITAMIN C) 500 MG CAPS Take 1 tablet by mouth daily.   Marland Kitchen aspirin 81 MG tablet Take 81 mg by mouth daily.  Marland Kitchen atorvastatin (LIPITOR) 40 MG tablet Take 1 tablet Daily for Cholesterol  . Biotin 10 MG TABS Take 1 tablet (10 mg total) by mouth daily.  . Calcium Citrate-Vitamin D (CALCIUM CITRATE + D3 PO) Take 600 mg by mouth daily.   . ciprofloxacin (CIPRO) 500 MG tablet Take 1 tablet 2 x /day with meal for UTI  . diphenhydramine-acetaminophen (TYLENOL PM) 25-500 MG TABS tablet Take 1 tablet by mouth at bedtime.   . famotidine (PEPCID) 20 MG tablet Take 1 tablet 2 x /day with meals if needed  for Acid Indigestion  . Ferrous Sulfate (SLOW RELEASE IRON PO) Take 1 tablet by mouth daily.   . fish oil-omega-3 fatty acids 1000 MG capsule Take 1 g by mouth daily.  . fluticasone (FLONASE) 50 MCG/ACT nasal spray Place 2 sprays into both nostrils daily as needed for allergies.  Marland Kitchen gabapentin (NEURONTIN) 100 MG capsule Take 1 capsule (100 mg total) by mouth 2 (two) times daily.  . hydrOXYzine (ATARAX/VISTARIL) 25 MG tablet Take 1 tablet (25 mg total) by mouth 3 (three) times daily as needed for itching.  . levothyroxine (SYNTHROID) 50 MCG tablet Take 1 tab daily on empty stomach with water only for thyroid.  . Magnesium 250 MG TABS Take 1 tablet by mouth daily.   . Melatonin 10 MG TABS Take  1 tablet by mouth at bedtime.  . meloxicam (MOBIC) 15 MG tablet Take 1 tablet (15 mg total) by mouth daily as needed for pain.  . nitroGLYCERIN (NITROSTAT) 0.4 MG SL tablet Place 1 tablet (0.4 mg total) under the tongue every 5 (five) minutes as needed for chest pain.  Marland Kitchen olmesartan-hydrochlorothiazide (BENICAR HCT) 20-12.5 MG tablet Take 1 tablet Daily for BP  . potassium chloride (K-DUR) 10 MEQ tablet Take 1 tablet 2 x /day  . rOPINIRole (REQUIP) 0.5 MG tablet Take 1 tablet at Bedtime for Restless Legs  . vitamin B-12 (CYANOCOBALAMIN) 1000 MCG tablet Take 1,000 mcg by mouth daily.   Marland Kitchen  Vitamin D, Ergocalciferol, (DRISDOL) 50000 units CAPS capsule Take 1 capsule (50,000 Units total) by mouth every other day.   No current facility-administered medications on file prior to visit.      Allergies:  Allergies  Allergen Reactions  . Codeine Other (See Comments)    "puts me out of it".  Dysphoria.  Patient tolerates Tramadol without complications.  . Flagyl [Metronidazole] Hives  . Nabumetone     GI upset     Medical History:  Past Medical History:  Diagnosis Date  . Allergic rhinitis   . Arthritis   . Cholecystitis, chronic   . Dysrhythmia    Hx bradycardia - hx normal cardiac cath x2 / ECHO 2009 EF 60% LAE MYOVIEW 3/12 EF 60% NO ISCHEMIA  . Hyperlipidemia   . Hypertension    no treatment after 120 lb weight loss  . Hypothyroidism   . Prediabetes   . Thyroid nodule    Family history- Reviewed and unchanged Social history- Reviewed and unchanged   Review of Systems:  Review of Systems  Constitutional: Negative for malaise/fatigue and weight loss.  HENT: Positive for congestion and sinus pain. Negative for hearing loss, sore throat and tinnitus.   Eyes: Negative for blurred vision and double vision.  Respiratory: Negative for cough, shortness of breath and wheezing.   Cardiovascular: Negative for chest pain, palpitations, orthopnea, claudication and leg swelling.   Gastrointestinal: Negative for abdominal pain, blood in stool, constipation, diarrhea, heartburn, melena, nausea and vomiting.  Genitourinary: Negative.   Musculoskeletal: Negative for joint pain and myalgias.  Skin: Negative for rash.  Neurological: Negative for dizziness, tingling, sensory change, weakness and headaches.  Endo/Heme/Allergies: Positive for environmental allergies. Negative for polydipsia.  Psychiatric/Behavioral: Negative.  Negative for depression. The patient is not nervous/anxious and does not have insomnia.   All other systems reviewed and are negative.   Physical Exam: BP 118/78   Pulse 68   Temp 97.6 F (36.4 C)   Ht 5\' 3"  (1.6 m)   Wt 176 lb 3.2 oz (79.9 kg)   LMP  (LMP Unknown)   SpO2 97%   BMI 31.21 kg/m  Wt Readings from Last 3 Encounters:  07/08/19 176 lb 3.2 oz (79.9 kg)  04/29/19 175 lb 9.6 oz (79.7 kg)  03/30/19 174 lb 8 oz (79.2 kg)   General Appearance: Well nourished, in no apparent distress. Eyes: PERRLA, EOMs, conjunctiva no swelling or erythema Sinuses: No Frontal tenderness, mild L sided maxillary tenderness ENT/Mouth: Ext aud canals clear, TMs without erythema, bulging. No erythema, swelling, or exudate on post pharynx.  Tonsils not swollen or erythematous. Hearing normal.  Neck: Supple, thyroid normal.  Respiratory: Respiratory effort normal, BS equal bilaterally without rales, rhonchi, wheezing or stridor.  Cardio: RRR with no MRGs. Brisk peripheral pulses without edema.  Abdomen: Soft, + BS.  Non tender, no guarding, rebound, hernias, masses. Lymphatics: Non tender without lymphadenopathy.  Musculoskeletal: Full ROM, 5/5 strength, Normal gait Skin: Sporadic urticarial rash on bilateral arms and back, some linear pattern along wrist. No lesions, ecchymosis.  Neuro: Cranial nerves intact. No cerebellar symptoms.  Psych: Awake and oriented X 3, normal affect, Insight and Judgment appropriate.         Vicie Mutters, PA-C 9:43 AM  [facility]

## 2019-07-08 ENCOUNTER — Ambulatory Visit: Payer: Federal, State, Local not specified - PPO | Admitting: Physician Assistant

## 2019-07-08 ENCOUNTER — Other Ambulatory Visit: Payer: Self-pay

## 2019-07-08 ENCOUNTER — Encounter: Payer: Self-pay | Admitting: Physician Assistant

## 2019-07-08 VITALS — BP 118/78 | HR 68 | Temp 97.6°F | Ht 63.0 in | Wt 176.2 lb

## 2019-07-08 DIAGNOSIS — R21 Rash and other nonspecific skin eruption: Secondary | ICD-10-CM | POA: Diagnosis not present

## 2019-07-08 DIAGNOSIS — I700 Atherosclerosis of aorta: Secondary | ICD-10-CM

## 2019-07-08 DIAGNOSIS — R7309 Other abnormal glucose: Secondary | ICD-10-CM | POA: Diagnosis not present

## 2019-07-08 DIAGNOSIS — D509 Iron deficiency anemia, unspecified: Secondary | ICD-10-CM

## 2019-07-08 DIAGNOSIS — E559 Vitamin D deficiency, unspecified: Secondary | ICD-10-CM | POA: Diagnosis not present

## 2019-07-08 DIAGNOSIS — Z79899 Other long term (current) drug therapy: Secondary | ICD-10-CM | POA: Diagnosis not present

## 2019-07-08 DIAGNOSIS — I10 Essential (primary) hypertension: Secondary | ICD-10-CM

## 2019-07-08 DIAGNOSIS — E785 Hyperlipidemia, unspecified: Secondary | ICD-10-CM

## 2019-07-08 DIAGNOSIS — E039 Hypothyroidism, unspecified: Secondary | ICD-10-CM

## 2019-07-08 DIAGNOSIS — R768 Other specified abnormal immunological findings in serum: Secondary | ICD-10-CM

## 2019-07-08 MED ORDER — PREDNISONE 20 MG PO TABS: ORAL_TABLET | ORAL | 0 refills | Status: AC

## 2019-07-08 NOTE — Patient Instructions (Addendum)
Please take the prednisone to help decrease inflammation and therefore decrease symptoms. Take it it with food to avoid GI upset. It can cause increased energy but on the other hand it can make it hard to sleep at night so please take it AT [place], it takes 8-12 hours to start working so it will NOT affect your sleeping if you take it at night with your food!!  If you are diabetic it will increase your sugars so decrease carbs and monitor your sugars closely.      Rash, Adult A rash is a change in the color of your skin. A rash can also change the way your skin feels. There are many different conditions and factors that can cause a rash. Some rashes may disappear after a few days, but some may last for a few weeks. Common causes of rashes include:  Viral infections, such as: ? Colds. ? Measles. ? Hand, foot, and mouth disease.  Bacterial infections, such as: ? Scarlet fever. ? Impetigo.  Fungal infections, such as Candida.  Allergic reactions to food, medicines, or skin care products. Follow these instructions at home: The goal of treatment is to stop the itching and keep the rash from spreading. Pay attention to any changes in your symptoms. Follow these instructions to help with your condition: Medicine Take or apply over-the-counter and prescription medicines only as told by your health care provider. These may include:  Corticosteroid creams to treat red or swollen skin.  Anti-itch lotions.  Oral allergy medicines (antihistamines).  Oral corticosteroids for severe symptoms.  Skin care  Apply cool compresses to the affected areas.  Do not scratch or rub your skin.  Avoid covering the rash. Make sure the rash is exposed to air as much as possible. Managing itching and discomfort  Avoid hot showers or baths, which can make itching worse. A cold shower may help.  Try taking a bath with: ? Epsom salts. Follow manufacturer instructions on the packaging. You can  get these at your local pharmacy or grocery store. ? Baking soda. Pour a small amount into the bath as told by your health care provider. ? Colloidal oatmeal. Follow manufacturer instructions on the packaging. You can get this at your local pharmacy or grocery store.  Try applying baking soda paste to your skin. Stir water into baking soda until it reaches a paste-like consistency.  Try applying calamine lotion. This is an over-the-counter lotion that helps to relieve itchiness.  Keep cool and out of the sun. Sweating and being hot can make itching worse. General instructions   Rest as needed.  Drink enough fluid to keep your urine pale yellow.  Wear loose-fitting clothing.  Avoid scented soaps, detergents, and perfumes. Use gentle soaps, detergents, perfumes, and other cosmetic products.  Avoid any substance that causes your rash. Keep a journal to help track what causes your rash. Write down: ? What you eat. ? What cosmetic products you use. ? What you drink. ? What you wear. This includes jewelry.  Keep all follow-up visits as told by your health care provider. This is important. Contact a health care provider if:  You sweat at night.  You lose weight.  You urinate more than normal.  You urinate less than normal, or you notice that your urine is a darker color than usual.  You feel weak.  You vomit.  Your skin or the whites of your eyes look yellow (jaundice).  Your skin: ? Tingles. ? Is numb.  Your rash: ? Does not go away after several days. ? Gets worse.  You are: ? Unusually thirsty. ? More tired than normal.  You have: ? New symptoms. ? Pain in your abdomen. ? A fever. ? Diarrhea. Get help right away if you:  Have a fever and your symptoms suddenly get worse.  Develop confusion.  Have a severe headache or a stiff neck.  Have severe joint pains or stiffness.  Have a seizure.  Develop a rash that covers all or most of your body. The rash  may or may not be painful.  Develop blisters that: ? Are on top of the rash. ? Grow larger or grow together. ? Are painful. ? Are inside your nose or mouth.  Develop a rash that: ? Looks like purple pinprick-sized spots all over your body. ? Has a "bull's eye" or looks like a target. ? Is not related to sun exposure, is red and painful, and causes your skin to peel. Summary  A rash is a change in the color of your skin. Some rashes disappear after a few days, but some may last for a few weeks.  The goal of treatment is to stop the itching and keep the rash from spreading.  Take or apply over-the-counter and prescription medicines only as told by your health care provider.  Contact a health care provider if you have new or worsening symptoms.  Keep all follow-up visits as told by your health care provider. This is important. This information is not intended to replace advice given to you by your health care provider. Make sure you discuss any questions you have with your health care provider. Document Released: 10/05/2002 Document Revised: 02/06/2019 Document Reviewed: 05/19/2018 Elsevier Patient Education  2020 [employer].

## 2019-07-09 LAB — CBC WITH DIFFERENTIAL/PLATELET
Absolute Monocytes: 530 cells/uL (ref 200–950)
Basophils Absolute: 61 cells/uL (ref 0–200)
Basophils Relative: 0.9 %
Eosinophils Absolute: 252 cells/uL (ref 15–500)
Eosinophils Relative: 3.7 %
HCT: 36.8 % (ref 35.0–45.0)
Hemoglobin: 12.2 g/dL (ref 11.7–15.5)
Lymphs Abs: 2550 cells/uL (ref 850–3900)
MCH: 30.9 pg (ref 27.0–33.0)
MCHC: 33.2 g/dL (ref 32.0–36.0)
MCV: 93.2 fL (ref 80.0–100.0)
MPV: 11.1 fL (ref 7.5–12.5)
Monocytes Relative: 7.8 %
Neutro Abs: 3407 cells/uL (ref 1500–7800)
Neutrophils Relative %: 50.1 %
Platelets: 266 10*3/uL (ref 140–400)
RBC: 3.95 10*6/uL (ref 3.80–5.10)
RDW: 13 % (ref 11.0–15.0)
Total Lymphocyte: 37.5 %
WBC: 6.8 10*3/uL (ref 3.8–10.8)

## 2019-07-09 LAB — MAGNESIUM: Magnesium: 2 mg/dL (ref 1.5–2.5)

## 2019-07-09 LAB — HEMOGLOBIN A1C
Hgb A1c MFr Bld: 5.5 % of total Hgb (ref <5.7)
Mean Plasma Glucose: 111 (calc)
eAG (mmol/L): 6.2 (calc)

## 2019-07-09 LAB — COMPLETE METABOLIC PANEL WITH GFR
AG Ratio: 1.7 (calc) (ref 1.0–2.5)
ALT: 78 U/L — ABNORMAL HIGH (ref 6–29)
AST: 56 U/L — ABNORMAL HIGH (ref 10–35)
Albumin: 4 g/dL (ref 3.6–5.1)
Alkaline phosphatase (APISO): 103 U/L (ref 37–153)
BUN/Creatinine Ratio: 12 (calc) (ref 6–22)
BUN: 12 mg/dL (ref 7–25)
CO2: 30 mmol/L (ref 20–32)
Calcium: 9.1 mg/dL (ref 8.6–10.4)
Chloride: 104 mmol/L (ref 98–110)
Creat: 1.04 mg/dL — ABNORMAL HIGH (ref 0.50–0.99)
GFR, Est African American: 64 mL/min/{1.73_m2} (ref >=60)
GFR, Est Non African American: 56 mL/min/{1.73_m2} — ABNORMAL LOW (ref >=60)
Globulin: 2.4 g/dL (calc) (ref 1.9–3.7)
Glucose, Bld: 85 mg/dL (ref 65–99)
Potassium: 4.2 mmol/L (ref 3.5–5.3)
Sodium: 142 mmol/L (ref 135–146)
Total Bilirubin: 1.4 mg/dL — ABNORMAL HIGH (ref 0.2–1.2)
Total Protein: 6.4 g/dL (ref 6.1–8.1)

## 2019-07-09 LAB — ANTI-DNA ANTIBODY, DOUBLE-STRANDED: ds DNA Ab: <1 IU/mL

## 2019-07-09 LAB — TSH: TSH: 1.88 mIU/L (ref 0.40–4.50)

## 2019-07-09 LAB — VITAMIN D 25 HYDROXY (VIT D DEFICIENCY, FRACTURES): Vit D, 25-Hydroxy: 65 ng/mL (ref 30–100)

## 2019-07-09 LAB — RHEUMATOID FACTOR: Rheumatoid fact SerPl-aCnc: <14 IU/mL (ref <14)

## 2019-07-09 LAB — ANA: Anti Nuclear Antibody (ANA): NEGATIVE

## 2019-07-09 LAB — LIPID PANEL
Cholesterol: 113 mg/dL (ref <200)
HDL: 69 mg/dL (ref >=50)
LDL Cholesterol (Calc): 31 mg/dL (calc)
Non-HDL Cholesterol (Calc): 44 mg/dL (calc) (ref <130)
Total CHOL/HDL Ratio: 1.6 (calc) (ref <5.0)
Triglycerides: 47 mg/dL (ref <150)

## 2019-07-09 LAB — SEDIMENTATION RATE: Sed Rate: 6 mm/h (ref 0–30)

## 2019-07-10 LAB — CYCLIC CITRUL PEPTIDE ANTIBODY, IGG: Cyclic Citrullin Peptide Ab: <16 UNITS

## 2019-07-10 LAB — C3 AND C4
C3 Complement: 113 mg/dL (ref 83–193)
C4 Complement: 17 mg/dL (ref 15–57)

## 2019-07-10 LAB — ANTI-SMITH ANTIBODY: ENA SM Ab Ser-aCnc: <1 AI

## 2019-07-10 LAB — ANCA SCREEN W REFLEX TITER
ANCA Screen: POSITIVE — AB
Atypical P-ANCA titer: 1:20 {titer} — ABNORMAL HIGH

## 2019-07-10 LAB — CENTROMERE ANTIBODIES: Centromere Ab Screen: <1 AI

## 2019-07-10 LAB — RNP ANTIBODY: Ribonucleic Protein(ENA) Antibody, IgG: <1 AI

## 2019-07-10 NOTE — Addendum Note (Signed)
Addended by: Vicie Mutters R on: 07/10/2019 08:16 AM   Modules accepted: Orders

## 2019-07-15 ENCOUNTER — Other Ambulatory Visit: Payer: Self-pay | Admitting: Physician Assistant

## 2019-07-15 DIAGNOSIS — R21 Rash and other nonspecific skin eruption: Secondary | ICD-10-CM

## 2019-07-15 DIAGNOSIS — R768 Other specified abnormal immunological findings in serum: Secondary | ICD-10-CM

## 2019-07-23 ENCOUNTER — Ambulatory Visit: Payer: Federal, State, Local not specified - PPO

## 2019-07-23 ENCOUNTER — Encounter: Payer: Self-pay | Admitting: Adult Health

## 2019-07-23 ENCOUNTER — Ambulatory Visit: Payer: Federal, State, Local not specified - PPO | Admitting: Adult Health

## 2019-07-23 ENCOUNTER — Other Ambulatory Visit: Payer: Self-pay

## 2019-07-23 VITALS — BP 130/80 | HR 50 | Temp 97.2°F | Ht 63.0 in | Wt 183.0 lb

## 2019-07-23 DIAGNOSIS — Z23 Encounter for immunization: Secondary | ICD-10-CM

## 2019-07-23 DIAGNOSIS — B373 Candidiasis of vulva and vagina: Secondary | ICD-10-CM | POA: Diagnosis not present

## 2019-07-23 DIAGNOSIS — J0100 Acute maxillary sinusitis, unspecified: Secondary | ICD-10-CM | POA: Diagnosis not present

## 2019-07-23 DIAGNOSIS — B3731 Acute candidiasis of vulva and vagina: Secondary | ICD-10-CM

## 2019-07-23 MED ORDER — AZITHROMYCIN 250 MG PO TABS: ORAL_TABLET | ORAL | 1 refills | Status: AC

## 2019-07-23 MED ORDER — FLUCONAZOLE 150 MG PO TABS: 150.0000 mg | ORAL_TABLET | ORAL | 0 refills | Status: DC

## 2019-07-23 NOTE — Patient Instructions (Addendum)
Sinusitis, Adult Sinusitis is inflammation of your sinuses. Sinuses are hollow spaces in the bones around your face. Your sinuses are located:  Around your eyes.  In the middle of your forehead.  Behind your nose.  In your cheekbones. Mucus normally drains out of your sinuses. When your nasal tissues become inflamed or swollen, mucus can become trapped or blocked. This allows bacteria, viruses, and fungi to grow, which leads to infection. Most infections of the sinuses are caused by a virus. Sinusitis can develop quickly. It can last for up to 4 weeks (acute) or for more than 12 weeks (chronic). Sinusitis often develops after a cold. What are the causes? This condition is caused by anything that creates swelling in the sinuses or stops mucus from draining. This includes:  Allergies.  Asthma.  Infection from bacteria or viruses.  Deformities or blockages in your nose or sinuses.  Abnormal growths in the nose (nasal polyps).  Pollutants, such as chemicals or irritants in the air.  Infection from fungi (rare). What increases the risk? You are more likely to develop this condition if you:  Have a weak body defense system (immune system).  Do a lot of swimming or diving.  Overuse nasal sprays.  Smoke. What are the signs or symptoms? The main symptoms of this condition are pain and a feeling of pressure around the affected sinuses. Other symptoms include:  Stuffy nose or congestion.  Thick drainage from your nose.  Swelling and warmth over the affected sinuses.  Headache.  Upper toothache.  A cough that may get worse at night.  Extra mucus that collects in the throat or the back of the nose (postnasal drip).  Decreased sense of smell and taste.  Fatigue.  A fever.  Sore throat.  Bad breath. How is this diagnosed? This condition is diagnosed based on:  Your symptoms.  Your medical history.  A physical exam.  Tests to find out if your condition is  acute or chronic. This may include: ? Checking your nose for nasal polyps. ? Viewing your sinuses using a device that has a light (endoscope). ? Testing for allergies or bacteria. ? Imaging tests, such as an MRI or CT scan. In rare cases, a bone biopsy may be done to rule out more serious types of fungal sinus disease. How is this treated? Treatment for sinusitis depends on the cause and whether your condition is chronic or acute.  If caused by a virus, your symptoms should go away on their own within 10 days. You may be given medicines to relieve symptoms. They include: ? Medicines that shrink swollen nasal passages (topical intranasal decongestants). ? Medicines that treat allergies (antihistamines). ? A spray that eases inflammation of the nostrils (topical intranasal corticosteroids). ? Rinses that help get rid of thick mucus in your nose (nasal saline washes).  If caused by bacteria, your health care provider may recommend waiting to see if your symptoms improve. Most bacterial infections will get better without antibiotic medicine. You may be given antibiotics if you have: ? A severe infection. ? A weak immune system.  If caused by narrow nasal passages or nasal polyps, you may need to have surgery. Follow these instructions at home: Medicines  Take, use, or apply over-the-counter and prescription medicines only as told by your health care provider. These may include nasal sprays.  If you were prescribed an antibiotic medicine, take it as told by your health care provider. Do not stop taking the antibiotic even if you start   to feel better. Hydrate and humidify   Drink enough fluid to keep your urine pale yellow. Staying hydrated will help to thin your mucus.  Use a cool mist humidifier to keep the humidity level in your home above 50%.  Inhale steam for 10-15 minutes, 3-4 times a day, or as told by your health care provider. You can do this in the bathroom while a hot shower is  running.  Limit your exposure to cool or dry air. Rest  Rest as much as possible.  Sleep with your head raised (elevated).  Make sure you get enough sleep each night. General instructions   Apply a warm, moist washcloth to your face 3-4 times a day or as told by your health care provider. This will help with discomfort.  Wash your hands often with soap and water to reduce your exposure to germs. If soap and water are not available, use hand sanitizer.  Do not smoke. Avoid being around people who are smoking (secondhand smoke).  Keep all follow-up visits as told by your health care provider. This is important. Contact a health care provider if:  You have a fever.  Your symptoms get worse.  Your symptoms do not improve within 10 days. Get help right away if:  You have a severe headache.  You have persistent vomiting.  You have severe pain or swelling around your face or eyes.  You have vision problems.  You develop confusion.  Your neck is stiff.  You have trouble breathing. Summary  Sinusitis is soreness and inflammation of your sinuses. Sinuses are hollow spaces in the bones around your face.  This condition is caused by nasal tissues that become inflamed or swollen. The swelling traps or blocks the flow of mucus. This allows bacteria, viruses, and fungi to grow, which leads to infection.  If you were prescribed an antibiotic medicine, take it as told by your health care provider. Do not stop taking the antibiotic even if you start to feel better.  Keep all follow-up visits as told by your health care provider. This is important. This information is not intended to replace advice given to you by your health care provider. Make sure you discuss any questions you have with your health care provider. Document Released: 10/15/2005 Document Revised: 03/17/2018 Document Reviewed: 03/17/2018 Elsevier Patient Education  2020 [employer].         Vaginal Yeast  infection, Adult  Vaginal yeast infection is a condition that causes vaginal discharge as well as soreness, swelling, and redness (inflammation) of the vagina. This is a common condition. Some women get this infection frequently. What are the causes? This condition is caused by a change in the normal balance of the yeast (candida) and bacteria that live in the vagina. This change causes an overgrowth of yeast, which causes the inflammation. What increases the risk? The condition is more likely to develop in women who:  Take antibiotic medicines.  Have diabetes.  Take birth control pills.  Are pregnant.  Douche often.  Have a weak body defense system (immune system).  Have been taking steroid medicines for a long time.  Frequently wear tight clothing. What are the signs or symptoms? Symptoms of this condition include:  White, thick, creamy vaginal discharge.  Swelling, itching, redness, and irritation of the vagina. The lips of the vagina (vulva) may be affected as well.  Pain or a burning feeling while urinating.  Pain during sex. How is this diagnosed? This condition is diagnosed based  on:  Your medical history.  A physical exam.  A pelvic exam. Your health care provider will examine a sample of your vaginal discharge under a microscope. Your health care provider may send this sample for testing to confirm the diagnosis. How is this treated? This condition is treated with medicine. Medicines may be over-the-counter or prescription. You may be told to use one or more of the following:  Medicine that is taken by mouth (orally).  Medicine that is applied as a cream (topically).  Medicine that is inserted directly into the vagina (suppository). Follow these instructions at home:  Lifestyle  Do not have sex until your health care provider approves. Tell your sex partner that you have a yeast infection. That person should go to his or her health care provider and ask  if they should also be treated.  Do not wear tight clothes, such as pantyhose or tight pants.  Wear breathable cotton underwear. General instructions  Take or apply over-the-counter and prescription medicines only as told by your health care provider.  Eat more yogurt. This may help to keep your yeast infection from returning.  Do not use tampons until your health care provider approves.  Try taking a sitz bath to help with discomfort. This is a warm water bath that is taken while you are sitting down. The water should only come up to your hips and should cover your buttocks. Do this 3-4 times per day or as told by your health care provider.  Do not douche.  If you have diabetes, keep your blood sugar levels under control.  Keep all follow-up visits as told by your health care provider. This is important. Contact a health care provider if:  You have a fever.  Your symptoms go away and then return.  Your symptoms do not get better with treatment.  Your symptoms get worse.  You have new symptoms.  You develop blisters in or around your vagina.  You have blood coming from your vagina and it is not your menstrual period.  You develop pain in your abdomen. Summary  Vaginal yeast infection is a condition that causes discharge as well as soreness, swelling, and redness (inflammation) of the vagina.  This condition is treated with medicine. Medicines may be over-the-counter or prescription.  Take or apply over-the-counter and prescription medicines only as told by your health care provider.  Do not douche. Do not have sex or use tampons until your health care provider approves.  Contact a health care provider if your symptoms do not get better with treatment or your symptoms go away and then return. This information is not intended to replace advice given to you by your health care provider. Make sure you discuss any questions you have with your health care provider. Document  Released: 07/25/2005 Document Revised: 03/03/2018 Document Reviewed: 03/03/2018 Elsevier Patient Education  [employer].

## 2019-07-23 NOTE — Progress Notes (Signed)
Assessment and Plan:  Tracy Paul was seen today for sinusitis, vaginitis and flu vaccine.  Diagnoses and all orders for this visit:  Vaginal yeast infection ROV prn if symptoms persist or worsen. -     fluconazole (DIFLUCAN) 150 MG tablet; Take 1 tablet (150 mg total) by mouth once a week. Until vaginal discharge resolves.  Acute maxillary sinusitis, recurrence not specified Hx of recurrent sinus infections; has had sinus surgery;  Discussed the importance of avoiding unnecessary antibiotic therapy;  She is on daily allergy pill, daily nasal steroid; she is currently on steroid taper via ? allergist Nasal saline spray for congestion. Doesn't tolerate nasal decongestants Follow up as needed. If recurrent persistent, suggest follow up with ENT -     azithromycin (ZITHROMAX) 250 MG tablet; Take 2 tablets (500 mg) on  Day 1,  followed by 1 tablet (250 mg) once daily on Days 2 through 5.  Need for immunization against influenza -     Flu vaccine HIGH DOSE PF  Further disposition pending results of labs. Discussed med's effects and SE's.   Over 30 minutes of exam, counseling, chart review, and critical decision making was performed.   Future Appointments  Date Time Provider [place]  10/08/2019 11:00 AM [name], MD [facility] None    ------------------------------------------------------------------------------------------------------------------   HPI BP 130/80   Pulse (!) 50   Temp (!) 97.2 F (36.2 C)   Ht 5\' 3"  (1.6 m)   Wt 183 lb (83 kg)   LMP  (LMP Unknown)   SpO2 99%   BMI 32.42 kg/m   68 y.o.female presents for flu vaccine, concern with vaginal yeast infection and new sinus pressure.   She reports vaginal discharge (white chunky), fishy odor, perineal itching x 1-2 weeks, began after took cipro for UTI.  Reports hx of frequent vaginal yeast infections. Married, no new partners, denies hx of STIs.   She is also concerned about sinus pressure x 1 week;  reports complex sinus history, recurrent sinus infections; reports hx of sinus surgery in the past.   Reports has had some runny nose, post-nasal drip, sinus pressure, tender and pain, some puffiness/swelling below eyes, pressure/sinus headache; denies sore throat, coughing/sneezing. Denies fever/chills.    She does report she takes a daily OTC allergy medication, unsure of what; she has been using flonase 1 spray BID.   She reports she is currently in the process of a steroid taper via ? allergist.   Past Medical History:  Diagnosis Date  . Allergic rhinitis   . Arthritis   . Cholecystitis, chronic   . Dysrhythmia    Hx bradycardia - hx normal cardiac cath x2 / ECHO 2009 EF 60% LAE MYOVIEW 3/12 EF 60% NO ISCHEMIA  . Hyperlipidemia   . Hypertension    no treatment after 120 lb weight loss  . Hypothyroidism   . Prediabetes   . Thyroid nodule      Allergies  Allergen Reactions  . Codeine Other (See Comments)    "puts me out of it".  Dysphoria.  Patient tolerates Tramadol without complications.  . Flagyl [Metronidazole] Hives  . Nabumetone     GI upset    Current Outpatient Medications on File Prior to Visit  Medication Sig  . Ascorbic Acid (VITAMIN C) 500 MG CAPS Take 1 tablet by mouth daily.   Marland Kitchen aspirin 81 MG tablet Take 81 mg by mouth daily.  Marland Kitchen atorvastatin (LIPITOR) 40 MG tablet Take 1 tablet Daily for Cholesterol  . Biotin  10 MG TABS Take 1 tablet (10 mg total) by mouth daily.  . Calcium Citrate-Vitamin D (CALCIUM CITRATE + D3 PO) Take 600 mg by mouth daily.   . diphenhydramine-acetaminophen (TYLENOL PM) 25-500 MG TABS tablet Take 1 tablet by mouth at bedtime.   . famotidine (PEPCID) 20 MG tablet Take 1 tablet 2 x /day with meals if needed  for Acid Indigestion  . Ferrous Sulfate (SLOW RELEASE IRON PO) Take 1 tablet by mouth daily.   . fish oil-omega-3 fatty acids 1000 MG capsule Take 1 g by mouth daily.  . fluticasone (FLONASE) 50 MCG/ACT nasal spray Place 2 sprays into  both nostrils daily as needed for allergies.  Marland Kitchen gabapentin (NEURONTIN) 100 MG capsule Take 1 capsule (100 mg total) by mouth 2 (two) times daily.  . hydrOXYzine (ATARAX/VISTARIL) 25 MG tablet Take 1 tablet (25 mg total) by mouth 3 (three) times daily as needed for itching.  . levothyroxine (SYNTHROID) 50 MCG tablet Take 1 tab daily on empty stomach with water only for thyroid.  . Magnesium 250 MG TABS Take 1 tablet by mouth daily.   . Melatonin 10 MG TABS Take 1 tablet by mouth at bedtime.  . meloxicam (MOBIC) 15 MG tablet Take 1 tablet (15 mg total) by mouth daily as needed for pain.  . nitroGLYCERIN (NITROSTAT) 0.4 MG SL tablet Place 1 tablet (0.4 mg total) under the tongue every 5 (five) minutes as needed for chest pain.  Marland Kitchen olmesartan-hydrochlorothiazide (BENICAR HCT) 20-12.5 MG tablet Take 1 tablet Daily for BP  . potassium chloride (K-DUR) 10 MEQ tablet Take 1 tablet 2 x /day  . rOPINIRole (REQUIP) 0.5 MG tablet Take 1 tablet at Bedtime for Restless Legs  . vitamin B-12 (CYANOCOBALAMIN) 1000 MCG tablet Take 1,000 mcg by mouth daily.   . Vitamin D, Ergocalciferol, (DRISDOL) 50000 units CAPS capsule Take 1 capsule (50,000 Units total) by mouth every other day.  . ciprofloxacin (CIPRO) 500 MG tablet Take 1 tablet 2 x /day with meal for UTI   No current facility-administered medications on file prior to visit.     ROS: all negative except above.   Physical Exam:  BP 130/80   Pulse (!) 50   Temp (!) 97.2 F (36.2 C)   Ht 5\' 3"  (1.6 m)   Wt 183 lb (83 kg)   LMP  (LMP Unknown)   SpO2 99%   BMI 32.42 kg/m   General Appearance: Well nourished, in no apparent distress. Eyes: PERRLA, EOMs, conjunctiva no swelling or erythema Sinuses: Generalized Frontal/maxillary tenderness, worst over left max sinus. No overlying edema or erythema ENT/Mouth: Ext aud canals clear, TMs without erythema, bulging. No erythema, swelling, or exudate on post pharynx.  Tonsils not swollen or erythematous.  Hearing normal.  Neck: Supple, thyroid normal.  Respiratory: Respiratory effort normal, BS equal bilaterally without rales, rhonchi, wheezing or stridor.  Cardio: RRR with no MRGs. Brisk peripheral pulses without edema.  Abdomen: Soft, + BS.  Non tender, no guarding, rebound, hernias, masses. Lymphatics: Non tender without lymphadenopathy.  Musculoskeletal: normal gait.  Skin: Warm, dry without rashes, lesions, ecchymosis.  Neuro: Normal muscle tone Psych: Awake and oriented X 3, normal affect, Insight and Judgment appropriate.     Tracy Ribas, NP 12:41 PM [facility]

## 2019-08-25 ENCOUNTER — Encounter: Payer: Self-pay | Admitting: Internal Medicine

## 2019-09-19 ENCOUNTER — Other Ambulatory Visit: Payer: Self-pay | Admitting: Internal Medicine

## 2019-10-08 ENCOUNTER — Other Ambulatory Visit: Payer: Self-pay

## 2019-10-08 ENCOUNTER — Ambulatory Visit: Payer: Federal, State, Local not specified - PPO | Admitting: Internal Medicine

## 2019-10-08 ENCOUNTER — Encounter: Payer: Self-pay | Admitting: Internal Medicine

## 2019-10-08 VITALS — BP 138/78 | HR 56 | Temp 97.0°F | Resp 16 | Ht 62.75 in | Wt 182.4 lb

## 2019-10-08 DIAGNOSIS — E559 Vitamin D deficiency, unspecified: Secondary | ICD-10-CM

## 2019-10-08 DIAGNOSIS — Z1329 Encounter for screening for other suspected endocrine disorder: Secondary | ICD-10-CM

## 2019-10-08 DIAGNOSIS — Z136 Encounter for screening for cardiovascular disorders: Secondary | ICD-10-CM

## 2019-10-08 DIAGNOSIS — Z131 Encounter for screening for diabetes mellitus: Secondary | ICD-10-CM

## 2019-10-08 DIAGNOSIS — E039 Hypothyroidism, unspecified: Secondary | ICD-10-CM

## 2019-10-08 DIAGNOSIS — I10 Essential (primary) hypertension: Secondary | ICD-10-CM | POA: Diagnosis not present

## 2019-10-08 DIAGNOSIS — Z13220 Encounter for screening for lipoid disorders: Secondary | ICD-10-CM | POA: Diagnosis not present

## 2019-10-08 DIAGNOSIS — E1169 Type 2 diabetes mellitus with other specified complication: Secondary | ICD-10-CM

## 2019-10-08 DIAGNOSIS — Z8249 Family history of ischemic heart disease and other diseases of the circulatory system: Secondary | ICD-10-CM

## 2019-10-08 DIAGNOSIS — K219 Gastro-esophageal reflux disease without esophagitis: Secondary | ICD-10-CM

## 2019-10-08 DIAGNOSIS — Z0000 Encounter for general adult medical examination without abnormal findings: Secondary | ICD-10-CM

## 2019-10-08 DIAGNOSIS — Z79899 Other long term (current) drug therapy: Secondary | ICD-10-CM

## 2019-10-08 DIAGNOSIS — Z1389 Encounter for screening for other disorder: Secondary | ICD-10-CM

## 2019-10-08 DIAGNOSIS — Z23 Encounter for immunization: Secondary | ICD-10-CM

## 2019-10-08 DIAGNOSIS — I700 Atherosclerosis of aorta: Secondary | ICD-10-CM | POA: Diagnosis not present

## 2019-10-08 DIAGNOSIS — I2510 Atherosclerotic heart disease of native coronary artery without angina pectoris: Secondary | ICD-10-CM

## 2019-10-08 DIAGNOSIS — Z0001 Encounter for general adult medical examination with abnormal findings: Secondary | ICD-10-CM

## 2019-10-08 DIAGNOSIS — E0822 Diabetes mellitus due to underlying condition with diabetic chronic kidney disease: Secondary | ICD-10-CM

## 2019-10-08 DIAGNOSIS — Z1211 Encounter for screening for malignant neoplasm of colon: Secondary | ICD-10-CM

## 2019-10-08 DIAGNOSIS — Z1212 Encounter for screening for malignant neoplasm of rectum: Secondary | ICD-10-CM

## 2019-10-08 NOTE — Progress Notes (Signed)
Annual Screening/Preventative Visit & Comprehensive Evaluation &  Examination     This very nice 68 y.o. MWF presents for a Screening /Preventative Visit & comprehensive evaluation and management of multiple medical co-morbidities.  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      HTN predates circa 1995. Patient's BP has been controlled at home and patient denies any cardiac symptoms as chest pain, palpitations, shortness of breath, dizziness or ankle swelling. Patient has had Neg heart caths x 2 in 1995 & 2000. In Oct 2019 she was hospitalized and MI was ruled out. Cardiac CTA excluded high risk CAD. Today's BP: 138/78      Patient's hyperlipidemia is controlled with diet and medications. Patient denies myalgias or other medication SE's. 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  She has remained of Diabetic meds since then. She denies reactive hypoglycemic symptoms, visual blurring, diabetic polys or paresthesias. Last A1c was Normal & at goal:  Lab Results  Component Value Date   HGBA1C 5.5 07/08/2019       Finally, patient has history of Vitamin D Deficiency and last Vitamin D was at goal:  Lab Results  Component Value Date   VD25OH 69 07/08/2019    Current Outpatient Medications on File Prior to Visit  Medication Sig   Ascorbic Acid (VITAMIN C) 500 MG CAPS Take 1 tablet by mouth daily.    aspirin 81 MG tablet Take 81 mg by mouth daily.   atorvastatin (LIPITOR) 40 MG tablet Take 1 tablet Daily for Cholesterol   Biotin 10 MG TABS Take 1 tablet (10 mg total) by mouth daily.   Calcium Citrate-Vitamin D (CALCIUM CITRATE + D3 PO) Take 600 mg by mouth  daily.    diphenhydramine-acetaminophen (TYLENOL PM) 25-500 MG TABS tablet Take 1 tablet by mouth at bedtime.    famotidine (PEPCID) 20 MG tablet Take 1 tablet 2 x /day with meals if needed  for Acid Indigestion   Ferrous Sulfate (SLOW RELEASE IRON PO) Take 1 tablet by mouth daily.    fish oil-omega-3 fatty acids 1000 MG capsule Take 1 g by mouth daily.   fluticasone (FLONASE) 50 MCG/ACT nasal spray Place 2 sprays into both nostrils daily as needed for allergies.   gabapentin (NEURONTIN) 100 MG capsule Take 1 capsule (100 mg total) by mouth 2 (two) times daily.   hydrOXYzine (ATARAX/VISTARIL) 25 MG tablet Take 1 tablet (25 mg total) by mouth 3 (three) times daily as needed for itching.   levothyroxine (SYNTHROID) 50 MCG tablet Take 1 tab daily on empty stomach with water only for thyroid.   Magnesium 250 MG TABS Take 1 tablet by mouth daily.    Melatonin 10 MG TABS Take 1 tablet by mouth at bedtime.   meloxicam (MOBIC) 15 MG tablet Take 1 tablet (15 mg total) by mouth daily as needed for pain.   nitroGLYCERIN (NITROSTAT) 0.4 MG SL tablet Place 1 tablet (0.4 mg total) under the tongue every 5 (five) minutes as needed for chest pain.   olmesartan-hydrochlorothiazide (BENICAR HCT) 20-12.5 MG tablet Take 1 tablet Daily for BP   potassium chloride (K-DUR)  10 MEQ tablet Take 1 tablet 2 x /day   rOPINIRole (REQUIP) 0.5 MG tablet Take 1 tablet at Bedtime for Restless Legs   vitamin B-12 (CYANOCOBALAMIN) 1000 MCG tablet Take 1,000 mcg by mouth daily.    Vitamin D, Ergocalciferol, (DRISDOL) 1.25 MG (50000 UT) CAPS capsule Take 1 capsule Daily for Severe Vitamin D Deficiency   No current facility-administered medications on file prior to visit.   Allergies  Allergen Reactions   Codeine Other (See Comments)    "puts me out of it".  Dysphoria.  Patient tolerates Tramadol without complications.   Flagyl [Metronidazole] Hives   Nabumetone     GI upset   Past Medical History:    Diagnosis Date   Allergic rhinitis    Arthritis    Cholecystitis, chronic    Dysrhythmia    Hx bradycardia - hx normal cardiac cath x2 / ECHO 2009 EF 60% LAE MYOVIEW 3/12 EF 60% NO ISCHEMIA   Hyperlipidemia    Hypertension    no treatment after 120 lb weight loss   Hypothyroidism    Prediabetes    Thyroid nodule    Health Maintenance  Topic Date Due   MAMMOGRAM  07/25/2014   PNA vac Low Risk Adult (2 of 2 - PPSV23) 08/01/2019   COLONOSCOPY  05/29/2023   TETANUS/TDAP  05/23/2026   INFLUENZA VACCINE  Completed   DEXA SCAN  Completed   Hepatitis C Screening  Completed   Immunization History  Administered Date(s) Administered   Influenza Split 08/25/2013, 09/02/2014, 08/10/2015   Influenza, High Dose Seasonal PF 07/31/2017, 08/20/2018, 07/23/2019   Influenza,inj,quad, With Preservative 08/10/2016   PPD Test 02/23/2014, 05/23/2016   Pneumococcal Conjugate-13 07/31/2018   Pneumococcal Polysaccharide-23 10/18/2008   Td 08/08/2006   Tdap 05/23/2016   Zoster 10/15/2012   Last Colon -  Last MGM -  Past Surgical History:  Procedure Laterality Date   Bilateral carpal tunnel surgery     BREATH TEK H PYLORI  07/01/2012   Procedure: BREATH TEK H PYLORI;  Surgeon: Edward Jolly, MD;  Location: [facility];  Service: General;  Laterality: N/A;   CHOLECYSTECTOMY N/A 11/27/2013   Procedure: LAPAROSCOPIC CHOLECYSTECTOMY WITH INTRAOPERATIVE CHOLANGIOGRAM;  Surgeon: Pedro Earls, MD;  Location: [facility];  Service: General;  Laterality: N/A;   GASTRIC ROUX-EN-Y N/A 12/16/2012   Procedure: LAPAROSCOPIC ROUX-EN-Y GASTRIC BYPASS WITH UPPER ENDOSCOPY;  Surgeon: Pedro Earls, MD;  Location: [facility];  Service: General;  Laterality: N/A;  Gastric Bypass   NASAL SINUS SURGERY     REPLACEMENT TOTAL KNEE BILATERAL     bilat   TUBAL LIGATION     Family History  Problem Relation Age of Onset   Breast cancer Mother    Cancer Mother        breast cancer    Anuerysm Mother        brain anuerysm   Heart attack Father 63   Heart attack Sister 71   Colon cancer Neg Hx    Stomach cancer Neg Hx    Rectal cancer Neg Hx    Esophageal cancer Neg Hx    Social History   Tobacco Use   Smoking status: Former Smoker    Packs/day: 0.80    Years: 10.00    Pack years: 8.00    Types: Cigarettes    Quit date: 10/30/1979    Years since quitting: 39.9   Smokeless tobacco: Never Used  Substance Use Topics   Alcohol use: No   Drug  use: No    ROS Constitutional: Denies fever, chills, weight loss/gain, headaches, insomnia,  night sweats, and change in appetite. Does c/o fatigue. Eyes: Denies redness, blurred vision, diplopia, discharge, itchy, watery eyes.  ENT: Denies discharge, congestion, post nasal drip, epistaxis, sore throat, earache, hearing loss, dental pain, Tinnitus, Vertigo, Sinus pain, snoring.  Cardio: Denies chest pain, palpitations, irregular heartbeat, syncope, dyspnea, diaphoresis, orthopnea, PND, claudication, edema Respiratory: denies cough, dyspnea, DOE, pleurisy, hoarseness, laryngitis, wheezing.  Gastrointestinal: Denies dysphagia, heartburn, reflux, water brash, pain, cramps, nausea, vomiting, bloating, diarrhea, constipation, hematemesis, melena, hematochezia, jaundice, hemorrhoids Genitourinary: Denies dysuria, frequency, urgency, nocturia, hesitancy, discharge, hematuria, flank pain Breast: Breast lumps, nipple discharge, bleeding.  Musculoskeletal: Denies arthralgia, myalgia, stiffness, Jt. Swelling, pain, limp, and strain/sprain. Denies falls. Skin: Denies puritis, rash, hives, warts, acne, eczema, changing in skin lesion Neuro: No weakness, tremor, incoordination, spasms, paresthesia, pain Psychiatric: Denies confusion, memory loss, sensory loss. Denies Depression. Endocrine: Denies change in weight, skin, hair change, nocturia, and paresthesia, diabetic polys, visual blurring, hyper / hypo glycemic episodes.   Heme/Lymph: No excessive bleeding, bruising, enlarged lymph nodes.  Physical Exam  BP 138/78    Pulse (!) 56    Temp (!) 97 F (36.1 C)    Resp 16    Ht 5' 2.75" (1.594 m)    Wt 182 lb 6.4 oz (82.7 kg)    LMP  (LMP Unknown)    BMI 32.57 kg/m   General Appearance: Well nourished, well groomed and in no apparent distress.  Eyes: PERRLA, EOMs, conjunctiva no swelling or erythema, normal fundi and vessels. Sinuses: No frontal/maxillary tenderness ENT/Mouth: EACs patent / TMs  nl. Nares clear without erythema, swelling, mucoid exudates. Oral hygiene is good. No erythema, swelling, or exudate. Tongue normal, non-obstructing. Tonsils not swollen or erythematous. Hearing normal.  Neck: Supple, thyroid not palpable. No bruits, nodes or JVD. Respiratory: Respiratory effort normal.  BS equal and clear bilateral without rales, rhonci, wheezing or stridor. Cardio: Heart sounds are normal with regular rate and rhythm and no murmurs, rubs or gallops. Peripheral pulses are normal and equal bilaterally without edema. No aortic or femoral bruits. Chest: symmetric with normal excursions and percussion. Breasts: Symmetric, without lumps, nipple discharge, retractions, or fibrocystic changes.  Abdomen: Flat, soft with bowel sounds active. Nontender, no guarding, rebound, hernias, masses, or organomegaly.  Lymphatics: Non tender without lymphadenopathy.  Musculoskeletal: Full ROM all peripheral extremities, joint stability, 5/5 strength, and normal gait. Skin: Warm and dry without rashes, lesions, cyanosis, clubbing or  ecchymosis.  Neuro: Cranial nerves intact, reflexes equal bilaterally. Normal muscle tone, no cerebellar symptoms. Sensation intact.  Pysch: Alert and oriented X 3, normal affect, Insight and Judgment appropriate.   Assessment and Plan  1. Annual Preventative Screening Examination  2. Essential hypertension  - EKG 12-Lead - Urinalysis, Routine w reflex microscopic - Microalbumin /  Creatinine Urine Ratio - CBC with Diff - COMPLETE METABOLIC PANEL WITH GFR - Magnesium - TSH  3. Hyperlipidemia associated with type 2 diabetes mellitus ([place])  - EKG 12-Lead - Lipid Profile - TSH  4. Diabetes mellitus due to underlying condition with stage 3 chronic kidney disease, without long-term current use of insulin, unspecified whether stage 3a or 3b CKD (HCC)  - EKG 12-Lead - Urinalysis, Routine w reflex microscopic - Microalbumin / Creatinine Urine Ratio - HM DIABETES FOOT EXAM - LOW EXTREMITY NEUR EXAM DOCUM - PTH, intact and calcium - Hemoglobin A1c ([facility]) - Insulin, random  5. Vitamin D deficiency  - Vitamin  D (25 hydroxy)  6. Hypothyroidism  - TSH  7. Gastroesophageal reflux disease  - COMPLETE METABOLIC PANEL WITH GFR  8. Aortic atherosclerosis (HCC)  - EKG 12-Lead  9. Coronary artery disease involving native coronary artery  of native heart without angina pectoris  - EKG 12-Lead - Lipid Profile  10. Screening for colorectal cancer  - POC Hemoccult Bld/Stl   11. Screening for ischemic heart disease  - EKG 12-Lead  12. FHx: heart disease  - EKG 12-Lead  13. Medication management  - Urinalysis, Routine w reflex microscopic - Microalbumin / Creatinine Urine Ratio - CBC with Diff - COMPLETE METABOLIC PANEL WITH GFR - Magnesium - Lipid Profile - TSH - Hemoglobin A1c ([facility]) - Insulin, random - Vitamin D (25 hydroxy)  14. Need for prophylactic vaccination against Streptococcus pneumoniae (pneumococcus)           Patient was counseled in prudent diet to achieve/maintain BMI less than 25 for weight control, BP monitoring, regular exercise and medications. Discussed med's effects and SE's. Screening labs and tests as requested with regular follow-up as recommended. Over 40 minutes of exam, counseling, chart review and high complex critical decision making was performed.   Kirtland Bouchard, MD

## 2019-10-08 NOTE — Patient Instructions (Signed)

## 2019-10-09 LAB — COMPLETE METABOLIC PANEL WITH GFR
AG Ratio: 1.6 (calc) (ref 1.0–2.5)
ALT: 27 U/L (ref 6–29)
AST: 31 U/L (ref 10–35)
Albumin: 3.8 g/dL (ref 3.6–5.1)
Alkaline phosphatase (APISO): 106 U/L (ref 37–153)
BUN/Creatinine Ratio: 10 (calc) (ref 6–22)
BUN: 11 mg/dL (ref 7–25)
CO2: 24 mmol/L (ref 20–32)
Calcium: 9 mg/dL (ref 8.6–10.4)
Chloride: 104 mmol/L (ref 98–110)
Creat: 1.05 mg/dL — ABNORMAL HIGH (ref 0.50–0.99)
GFR, Est African American: 64 mL/min/{1.73_m2} (ref >=60)
GFR, Est Non African American: 55 mL/min/{1.73_m2} — ABNORMAL LOW (ref >=60)
Globulin: 2.4 g/dL (calc) (ref 1.9–3.7)
Glucose, Bld: 81 mg/dL (ref 65–99)
Potassium: 4.4 mmol/L (ref 3.5–5.3)
Sodium: 138 mmol/L (ref 135–146)
Total Bilirubin: 1.5 mg/dL — ABNORMAL HIGH (ref 0.2–1.2)
Total Protein: 6.2 g/dL (ref 6.1–8.1)

## 2019-10-09 LAB — MICROALBUMIN / CREATININE URINE RATIO
Creatinine, Urine: 60 mg/dL (ref 20–275)
Microalb Creat Ratio: 3 mcg/mg creat (ref <30)
Microalb, Ur: 0.2 mg/dL

## 2019-10-09 LAB — URINALYSIS, ROUTINE W REFLEX MICROSCOPIC
Bacteria, UA: NONE SEEN /HPF
Bilirubin Urine: NEGATIVE
Glucose, UA: NEGATIVE
Hgb urine dipstick: NEGATIVE
Hyaline Cast: NONE SEEN /LPF
Ketones, ur: NEGATIVE
Nitrite: NEGATIVE
Protein, ur: NEGATIVE
RBC / HPF: NONE SEEN /HPF (ref 0–2)
Specific Gravity, Urine: 1.008 (ref 1.001–1.03)
Squamous Epithelial / HPF: NONE SEEN /HPF (ref <=5)
WBC, UA: NONE SEEN /HPF (ref 0–5)
pH: <=5 (ref 5.0–8.0)

## 2019-10-09 LAB — MAGNESIUM: Magnesium: 2 mg/dL (ref 1.5–2.5)

## 2019-10-09 LAB — CBC WITH DIFFERENTIAL/PLATELET
Absolute Monocytes: 431 cells/uL (ref 200–950)
Basophils Absolute: 59 cells/uL (ref 0–200)
Basophils Relative: 1.2 %
Eosinophils Absolute: 191 cells/uL (ref 15–500)
Eosinophils Relative: 3.9 %
HCT: 35.2 % (ref 35.0–45.0)
Hemoglobin: 11.5 g/dL — ABNORMAL LOW (ref 11.7–15.5)
Lymphs Abs: 2328 cells/uL (ref 850–3900)
MCH: 30.8 pg (ref 27.0–33.0)
MCHC: 32.7 g/dL (ref 32.0–36.0)
MCV: 94.4 fL (ref 80.0–100.0)
MPV: 11.2 fL (ref 7.5–12.5)
Monocytes Relative: 8.8 %
Neutro Abs: 1891 cells/uL (ref 1500–7800)
Neutrophils Relative %: 38.6 %
Platelets: 272 10*3/uL (ref 140–400)
RBC: 3.73 10*6/uL — ABNORMAL LOW (ref 3.80–5.10)
RDW: 12.1 % (ref 11.0–15.0)
Total Lymphocyte: 47.5 %
WBC: 4.9 10*3/uL (ref 3.8–10.8)

## 2019-10-09 LAB — PTH, INTACT AND CALCIUM
Calcium: 9 mg/dL (ref 8.6–10.4)
PTH: 51 pg/mL (ref 14–64)

## 2019-10-09 LAB — HEMOGLOBIN A1C
Hgb A1c MFr Bld: 5.7 % of total Hgb — ABNORMAL HIGH (ref <5.7)
Mean Plasma Glucose: 117 (calc)
eAG (mmol/L): 6.5 (calc)

## 2019-10-09 LAB — LIPID PANEL
Cholesterol: 100 mg/dL (ref <200)
HDL: 54 mg/dL (ref >=50)
LDL Cholesterol (Calc): 32 mg/dL (calc)
Non-HDL Cholesterol (Calc): 46 mg/dL (calc) (ref <130)
Total CHOL/HDL Ratio: 1.9 (calc) (ref <5.0)
Triglycerides: 56 mg/dL (ref <150)

## 2019-10-09 LAB — VITAMIN D 25 HYDROXY (VIT D DEFICIENCY, FRACTURES): Vit D, 25-Hydroxy: 95 ng/mL (ref 30–100)

## 2019-10-09 LAB — INSULIN, RANDOM: Insulin: 3.6 u[IU]/mL

## 2019-10-09 LAB — TSH: TSH: 1.47 mIU/L (ref 0.40–4.50)

## 2019-11-01 ENCOUNTER — Other Ambulatory Visit: Payer: Self-pay | Admitting: Internal Medicine

## 2019-11-01 MED ORDER — ROPINIROLE HCL 0.5 MG PO TABS: ORAL_TABLET | ORAL | 3 refills | Status: DC

## 2019-11-16 ENCOUNTER — Other Ambulatory Visit: Payer: Self-pay | Admitting: Internal Medicine

## 2019-11-25 ENCOUNTER — Other Ambulatory Visit: Payer: Self-pay | Admitting: Internal Medicine

## 2020-01-11 NOTE — Progress Notes (Signed)
FOLLOW UP  Assessment and Plan: Essential hypertension -     CBC with Differential/Platelet -     COMPLETE METABOLIC PANEL WITH GFR -     TSH - continue medications, DASH diet, exercise and monitor at home. Call if greater than 130/80.   Aortic atherosclerosis (HCC) Control blood pressure, cholesterol, glucose, increase exercise.   Hypothyroidism, unspecified type -     TSH Hypothyroidism-check TSH level, continue medications the same, reminded to take on an empty stomach 30-38mins before food.   Hyperlipidemia LDL goal <70 -     Lipid panel check lipids decrease fatty foods increase activity.   Medication management -     Magnesium  Vitamin D deficiency -     VITAMIN D 25 Hydroxy (Vit-D Deficiency, Fractures)  Abnormal glucose -     Hemoglobin A1c Discussed general issues about diabetes pathophysiology and management., Educational material distributed., Suggested low cholesterol diet., Encouraged aerobic exercise., Discussed foot care., Reminded to get yearly retinal exam.   Continue diet and meds as discussed. Further disposition pending results of labs. Discussed med's effects and SE's.   Over 30 minutes of exam, counseling, chart review, and critical decision making was performed.   Future Appointments  Date Time Provider [place]  01/12/2020 11:00 AM Vicie Mutters, PA-C [facility] None  05/10/2020 11:30 AM [name], MD [facility] None  11/07/2020 10:00 AM [name], MD [facility] None    ----------------------------------------------------------------------------------------------------------------------  HPI 69 y.o. right handed female  presents for 3 month follow up on hypertension, cholesterol, glucose management, weight (s/p roux-en-y) and vitamin D deficiency.   She has history of allergies, does mucinex, flonase. For last 3 weeks has had sinus pressure, drainage. No fever, chills. No teeth.   She has right hand numbness at her 3rd  finger, day or night, nothing worse, nothing better, she just rubs on it until it goes away. No dropping things, no weakness in that hand with grip. Has neck pain, no elbow pain. No weakness in arm, no pain in that arm. Has had bilateral carpal tunnel surgery 30 years ago.   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  She has hx of GERD/hiatal hernia, was diagnosed with H.pylori + ulcer 12/17, and again in 09/2017. She had EGD in 02/2018 which was unremarkable with neg H. Pylori. she is currently managed by pepcid daily, PRN mylanta, using a few times a month. Off protonix  BMI is Body mass index is 32.32 kg/m., she has been working on diet and exercise, watching portions, cutting back on sweet tea and using artifical sugars. She is active but not intentionally exercising. Wt Readings from Last 3 Encounters:  01/12/20 181 lb (82.1 kg)  10/08/19 182 lb 6.4 oz (82.7 kg)  07/23/19 183 lb (83 kg)   Her blood pressure has been controlled at home, today their BP is BP: 130/74  She does workout. She denies chest pain, shortness of breath, dizziness.   She is on cholesterol medication (atorvastatin 40 mg daily) and denies myalgias. Her cholesterol is at goal. The cholesterol last visit was:   Lab Results  Component Value Date   CHOL 100 10/08/2019   HDL 54 10/08/2019   LDLCALC 32 10/08/2019   TRIG 56 10/08/2019   CHOLHDL 1.9 10/08/2019    She has been working on diet and exercise for glucose management, and  denies increased appetite, nausea, paresthesia of the feet, polydipsia, polyuria, visual disturbances, vomiting and weight loss. Last A1C in the office was:  Lab Results  Component Value Date   HGBA1C 5.7 (H) 10/08/2019   She is on thyroid medication, not on biotin. Her medication was not changed last visit.   Lab Results  Component Value Date    TSH 1.47 10/08/2019   Patient is on Vitamin D supplement.   Lab Results  Component Value Date   VD25OH 95 10/08/2019        Current Medications:  Current Outpatient Medications on File Prior to Visit  Medication Sig  . Ascorbic Acid (VITAMIN C) 500 MG CAPS Take 1 tablet by mouth daily.   Marland Kitchen aspirin 81 MG tablet Take 81 mg by mouth daily.  Marland Kitchen atorvastatin (LIPITOR) 40 MG tablet Take 1 tablet Daily for Cholesterol  . Biotin 10 MG TABS Take 1 tablet (10 mg total) by mouth daily.  . Calcium Citrate-Vitamin D (CALCIUM CITRATE + D3 PO) Take 600 mg by mouth daily.   . diphenhydramine-acetaminophen (TYLENOL PM) 25-500 MG TABS tablet Take 1 tablet by mouth at bedtime.   . famotidine (PEPCID) 20 MG tablet Take 1 tablet 2 x /day with meals if needed  for Acid Indigestion  . Ferrous Sulfate (SLOW RELEASE IRON PO) Take 1 tablet by mouth daily.   . fish oil-omega-3 fatty acids 1000 MG capsule Take 1 g by mouth daily.  . fluticasone (FLONASE) 50 MCG/ACT nasal spray Place 2 sprays into both nostrils daily as needed for allergies.  Marland Kitchen gabapentin (NEURONTIN) 100 MG capsule Take 1 capsule 4 x /day for Neuropathy pain  . hydrOXYzine (ATARAX/VISTARIL) 25 MG tablet Take 1 tablet (25 mg total) by mouth 3 (three) times daily as needed for itching.  . levothyroxine (SYNTHROID) 50 MCG tablet Take 1 tab daily on empty stomach with water only for thyroid.  . Magnesium 250 MG TABS Take 1 tablet by mouth daily.   . Melatonin 10 MG TABS Take 1 tablet by mouth at bedtime.  . meloxicam (MOBIC) 15 MG tablet Take 1 tablet (15 mg total) by mouth daily as needed for pain.  . nitroGLYCERIN (NITROSTAT) 0.4 MG SL tablet Place 1 tablet (0.4 mg total) under the tongue every 5 (five) minutes as needed for chest pain.  Marland Kitchen olmesartan-hydrochlorothiazide (BENICAR HCT) 20-12.5 MG tablet Take 1 tablet Daily for BP  . potassium chloride (K-DUR) 10 MEQ tablet Take 1 tablet 2 x /day  . rOPINIRole (REQUIP) 0.5 MG tablet Take 1 tablet at  Bedtime for Restless Legs  . vitamin B-12 (CYANOCOBALAMIN) 1000 MCG tablet Take 1,000 mcg by mouth daily.   . Vitamin D, Ergocalciferol, (DRISDOL) 1.25 MG (50000 UT) CAPS capsule Take 1 capsule Daily for Severe Vitamin D Deficiency   No current facility-administered medications on file prior to visit.     Allergies:  Allergies  Allergen Reactions  . Codeine Other (See Comments)    "puts me out of it".  Dysphoria.  Patient tolerates Tramadol without complications.  . Flagyl [Metronidazole] Hives  . Nabumetone     GI upset     Medical History:  Past Medical History:  Diagnosis Date  . Allergic rhinitis   . Arthritis   . Cholecystitis, chronic   . Dysrhythmia    Hx bradycardia - hx normal cardiac cath x2 / ECHO 2009 EF 60% LAE MYOVIEW 3/12 EF 60% NO ISCHEMIA  . Hyperlipidemia   . Hypertension    no treatment  after 120 lb weight loss  . Hypothyroidism   . Prediabetes   . Thyroid nodule    Family history- Reviewed and unchanged Social history- Reviewed and unchanged   Review of Systems:  Review of Systems  Constitutional: Negative for malaise/fatigue and weight loss.  HENT: Positive for congestion and sinus pain. Negative for hearing loss, sore throat and tinnitus.   Eyes: Negative for blurred vision and double vision.  Respiratory: Negative for cough, shortness of breath and wheezing.   Cardiovascular: Negative for chest pain, palpitations, orthopnea, claudication and leg swelling.  Gastrointestinal: Negative for abdominal pain, blood in stool, constipation, diarrhea, heartburn, melena, nausea and vomiting.  Genitourinary: Negative.   Musculoskeletal: Negative for joint pain and myalgias.  Skin: Negative for rash.  Neurological: Negative for dizziness, tingling, sensory change, weakness and headaches.  Endo/Heme/Allergies: Positive for environmental allergies. Negative for polydipsia.  Psychiatric/Behavioral: Negative.  Negative for depression. The patient is not  nervous/anxious and does not have insomnia.   All other systems reviewed and are negative.   Physical Exam: BP 130/74   Pulse (!) 54   Temp 97.6 F (36.4 C)   Wt 181 lb (82.1 kg)   LMP  (LMP Unknown)   SpO2 99%   BMI 32.32 kg/m  Wt Readings from Last 3 Encounters:  01/12/20 181 lb (82.1 kg)  10/08/19 182 lb 6.4 oz (82.7 kg)  07/23/19 183 lb (83 kg)   General Appearance: Well nourished, in no apparent distress. Eyes: PERRLA, EOMs, conjunctiva no swelling or erythema Sinuses: No Frontal tenderness, mild L sided maxillary tenderness ENT/Mouth: Ext aud canals clear, TMs without erythema, bulging. No erythema, swelling, or exudate on post pharynx.  Tonsils not swollen or erythematous. Hearing normal.  Neck: Supple, thyroid normal.  Respiratory: Respiratory effort normal, BS equal bilaterally without rales, rhonchi, wheezing or stridor.  Cardio: RRR with no MRGs. Brisk peripheral pulses without edema.  Abdomen: Soft, + BS.  Non tender, no guarding, rebound, hernias, masses. Lymphatics: Non tender without lymphadenopathy.  Musculoskeletal: Full ROM, 5/5 strength, Normal gait. Right carpal tunnel compression test is negative, neurovascular exam is normal and no thenar or hypothenar atrophy Skin: Sporadic urticarial rash on bilateral arms and back, some linear pattern along wrist. No lesions, ecchymosis.  Neuro: Cranial nerves intact. No cerebellar symptoms.  Psych: Awake and oriented X 3, normal affect, Insight and Judgment appropriate.         Vicie Mutters, PA-C 10:52 AM [facility]

## 2020-01-12 ENCOUNTER — Ambulatory Visit: Payer: Federal, State, Local not specified - PPO | Admitting: Adult Health

## 2020-01-12 ENCOUNTER — Encounter: Payer: Self-pay | Admitting: Physician Assistant

## 2020-01-12 ENCOUNTER — Other Ambulatory Visit: Payer: Self-pay

## 2020-01-12 ENCOUNTER — Ambulatory Visit: Payer: Federal, State, Local not specified - PPO | Admitting: Physician Assistant

## 2020-01-12 VITALS — BP 130/74 | HR 54 | Temp 97.6°F | Wt 181.0 lb

## 2020-01-12 DIAGNOSIS — I10 Essential (primary) hypertension: Secondary | ICD-10-CM

## 2020-01-12 DIAGNOSIS — R7309 Other abnormal glucose: Secondary | ICD-10-CM

## 2020-01-12 DIAGNOSIS — E559 Vitamin D deficiency, unspecified: Secondary | ICD-10-CM

## 2020-01-12 DIAGNOSIS — G5601 Carpal tunnel syndrome, right upper limb: Secondary | ICD-10-CM

## 2020-01-12 DIAGNOSIS — E039 Hypothyroidism, unspecified: Secondary | ICD-10-CM | POA: Diagnosis not present

## 2020-01-12 DIAGNOSIS — E785 Hyperlipidemia, unspecified: Secondary | ICD-10-CM | POA: Diagnosis not present

## 2020-01-12 DIAGNOSIS — D509 Iron deficiency anemia, unspecified: Secondary | ICD-10-CM

## 2020-01-12 DIAGNOSIS — I700 Atherosclerosis of aorta: Secondary | ICD-10-CM

## 2020-01-12 DIAGNOSIS — Z79899 Other long term (current) drug therapy: Secondary | ICD-10-CM

## 2020-01-12 DIAGNOSIS — J0101 Acute recurrent maxillary sinusitis: Secondary | ICD-10-CM

## 2020-01-12 MED ORDER — AMOXICILLIN-POT CLAVULANATE 875-125 MG PO TABS: 1.0000 | ORAL_TABLET | Freq: Two times a day (BID) | ORAL | 0 refills | Status: AC

## 2020-01-12 MED ORDER — FLUCONAZOLE 150 MG PO TABS: 150.0000 mg | ORAL_TABLET | Freq: Every day | ORAL | 3 refills | Status: DC

## 2020-01-12 NOTE — Patient Instructions (Addendum)
Do the carpal tunnel brace from a pharmacy at night for 4-6 weeks, if this does not help we can refer to ortho.  Carpal Tunnel Syndrome  Carpal tunnel syndrome is a condition that causes pain in your hand and arm. The carpal tunnel is a narrow area located on the palm side of your wrist. Repeated wrist motion or certain diseases may cause swelling within the tunnel. This swelling pinches the main nerve in the wrist (median nerve). What are the causes? This condition may be caused by:  Repeated wrist motions.  Wrist injuries.  Arthritis.  A cyst or tumor in the carpal tunnel.  Fluid buildup during pregnancy.  Sometimes the cause of this condition is not known. What increases the risk? This condition is more likely to develop in:  People who have jobs that cause them to repeatedly move their wrists in the same motion, such as Art gallery manager.  Women.  People with certain conditions, such as: ? Diabetes. ? Obesity. ? An underactive thyroid (hypothyroidism). ? Kidney failure.  What are the signs or symptoms? Symptoms of this condition include:  A tingling feeling in your fingers, especially in your thumb, index, and middle fingers.  Tingling or numbness in your hand.  An aching feeling in your entire arm, especially when your wrist and elbow are bent for long periods of time.  Wrist pain that goes up your arm to your shoulder.  Pain that goes down into your palm or fingers.  A weak feeling in your hands. You may have trouble grabbing and holding items.  Your symptoms may feel worse during the night. How is this diagnosed? This condition is diagnosed with a medical history and physical exam. You may also have tests, including:  An electromyogram (EMG). This test measures electrical signals sent by your nerves into the muscles.  X-rays.  How is this treated? Treatment for this condition includes:  Lifestyle changes. It is important to stop doing or modify  the activity that caused your condition.  Physical or occupational therapy.  Medicines for pain and inflammation. This may include medicine that is injected into your wrist.  A wrist splint.  Surgery.  Follow these instructions at home: If you have a splint:  Wear it as told by your health care provider. Remove it only as told by your health care provider.  Loosen the splint if your fingers become numb and tingle, or if they turn cold and blue.  Keep the splint clean and dry. General instructions  Take over-the-counter and prescription medicines only as told by your health care provider.  Rest your wrist from any activity that may be causing your pain. If your condition is work related, talk to your employer about changes that can be made, such as getting a wrist pad to use while typing.  If directed, apply ice to the painful area: ? Put ice in a plastic bag. ? Place a towel between your skin and the bag. ? Leave the ice on for 20 minutes, 2-3 times per day.  Keep all follow-up visits as told by your health care provider. This is important.  Do any exercises as told by your health care provider, physical therapist, or occupational therapist. Contact a health care provider if:  You have new symptoms.  Your pain is not controlled with medicines.  Your symptoms get worse. This information is not intended to replace advice given to you by your health care provider. Make sure you discuss any questions you  have with your health care provider. Document Released: 10/12/2000 Document Revised: 02/23/2016 Document Reviewed: 03/02/2015 Elsevier Interactive Patient Education  [employer].

## 2020-01-13 LAB — COMPLETE METABOLIC PANEL WITH GFR
AG Ratio: 1.6 (calc) (ref 1.0–2.5)
ALT: 27 U/L (ref 6–29)
AST: 29 U/L (ref 10–35)
Albumin: 3.9 g/dL (ref 3.6–5.1)
Alkaline phosphatase (APISO): 103 U/L (ref 37–153)
BUN: 14 mg/dL (ref 7–25)
CO2: 28 mmol/L (ref 20–32)
Calcium: 9.3 mg/dL (ref 8.6–10.4)
Chloride: 105 mmol/L (ref 98–110)
Creat: 0.95 mg/dL (ref 0.50–0.99)
GFR, Est African American: 72 mL/min/{1.73_m2} (ref >=60)
GFR, Est Non African American: 62 mL/min/{1.73_m2} (ref >=60)
Globulin: 2.4 g/dL (calc) (ref 1.9–3.7)
Glucose, Bld: 96 mg/dL (ref 65–99)
Potassium: 4.9 mmol/L (ref 3.5–5.3)
Sodium: 140 mmol/L (ref 135–146)
Total Bilirubin: 1.3 mg/dL — ABNORMAL HIGH (ref 0.2–1.2)
Total Protein: 6.3 g/dL (ref 6.1–8.1)

## 2020-01-13 LAB — CBC WITH DIFFERENTIAL/PLATELET
Absolute Monocytes: 493 cells/uL (ref 200–950)
Basophils Absolute: 81 cells/uL (ref 0–200)
Basophils Relative: 1.4 %
Eosinophils Absolute: 423 cells/uL (ref 15–500)
Eosinophils Relative: 7.3 %
HCT: 36.6 % (ref 35.0–45.0)
Hemoglobin: 12.1 g/dL (ref 11.7–15.5)
Lymphs Abs: 2366 cells/uL (ref 850–3900)
MCH: 31.1 pg (ref 27.0–33.0)
MCHC: 33.1 g/dL (ref 32.0–36.0)
MCV: 94.1 fL (ref 80.0–100.0)
MPV: 11 fL (ref 7.5–12.5)
Monocytes Relative: 8.5 %
Neutro Abs: 2436 cells/uL (ref 1500–7800)
Neutrophils Relative %: 42 %
Platelets: 271 10*3/uL (ref 140–400)
RBC: 3.89 10*6/uL (ref 3.80–5.10)
RDW: 12.5 % (ref 11.0–15.0)
Total Lymphocyte: 40.8 %
WBC: 5.8 10*3/uL (ref 3.8–10.8)

## 2020-01-13 LAB — LIPID PANEL
Cholesterol: 99 mg/dL (ref <200)
HDL: 50 mg/dL (ref >=50)
LDL Cholesterol (Calc): 36 mg/dL (calc)
Non-HDL Cholesterol (Calc): 49 mg/dL (calc) (ref <130)
Total CHOL/HDL Ratio: 2 (calc) (ref <5.0)
Triglycerides: 51 mg/dL (ref <150)

## 2020-01-13 LAB — HEMOGLOBIN A1C
Hgb A1c MFr Bld: 5.6 % of total Hgb (ref <5.7)
Mean Plasma Glucose: 114 (calc)
eAG (mmol/L): 6.3 (calc)

## 2020-01-13 LAB — VITAMIN D 25 HYDROXY (VIT D DEFICIENCY, FRACTURES): Vit D, 25-Hydroxy: 126 ng/mL — ABNORMAL HIGH (ref 30–100)

## 2020-01-13 LAB — MAGNESIUM: Magnesium: 2.1 mg/dL (ref 1.5–2.5)

## 2020-01-13 LAB — TSH: TSH: 1.68 mIU/L (ref 0.40–4.50)

## 2020-02-20 ENCOUNTER — Other Ambulatory Visit: Payer: Self-pay | Admitting: Internal Medicine

## 2020-02-20 DIAGNOSIS — G2581 Restless legs syndrome: Secondary | ICD-10-CM

## 2020-02-20 MED ORDER — ROPINIROLE HCL 3 MG PO TABS: ORAL_TABLET | ORAL | 0 refills | Status: DC

## 2020-02-23 ENCOUNTER — Other Ambulatory Visit: Payer: Self-pay | Admitting: Internal Medicine

## 2020-03-07 ENCOUNTER — Telehealth: Payer: Self-pay | Admitting: Physician Assistant

## 2020-03-07 MED ORDER — AZITHROMYCIN 250 MG PO TABS: ORAL_TABLET | ORAL | 1 refills | Status: AC

## 2020-03-07 NOTE — Telephone Encounter (Signed)
-----   Message from Elenor Quinones, [place] sent at 03/07/2020  9:13 AM EDT ----- Regarding: MED REQUEST/PHONE/OFFICE NOTE Contact: [phone] PER THE PATIENT'S HUSBAND:          Tracy Paul still has an sinus infection & has REQUESTED a Z-PAK.   Pharmacy: [facility]

## 2020-05-03 ENCOUNTER — Other Ambulatory Visit: Payer: Self-pay | Admitting: Internal Medicine

## 2020-05-03 DIAGNOSIS — G2581 Restless legs syndrome: Secondary | ICD-10-CM

## 2020-05-03 DIAGNOSIS — E785 Hyperlipidemia, unspecified: Secondary | ICD-10-CM

## 2020-05-04 ENCOUNTER — Other Ambulatory Visit: Payer: Self-pay | Admitting: Internal Medicine

## 2020-05-04 MED ORDER — CYCLOBENZAPRINE HCL 10 MG PO TABS: ORAL_TABLET | ORAL | 0 refills | Status: DC

## 2020-05-09 ENCOUNTER — Encounter: Payer: Self-pay | Admitting: Internal Medicine

## 2020-05-09 NOTE — Patient Instructions (Signed)

## 2020-05-09 NOTE — Progress Notes (Signed)
History of Present Illness:       This very nice 69 y.o.  MWF presents for 3 month follow up with HTN, HLD, Pre-Diabetes and Vitamin D Deficiency.  Patient's heartburn /pyrosis is not controlled on Pepcid and therapy is changed to Nexium.       Patient is treated for HTN (1995)  & BP has been controlled at home. Today's BP is at goal - 110/60 by Nurse, but patient is c/o postural dizziness and rech postural BP's sit BP 125/59  P 51 and Stand BP 101/53  P 46.   In 1995 & 2000, she had Neg heart caths x 2. In Oct 2019,  she was hospitalized & MI was ruled out. Cardiac CTA excluded high risk CAD.  Patient has had no complaints of any cardiac type chest pain, palpitations, dyspnea / orthopnea / PND, dizziness, claudication, or dependent edema.      Hyperlipidemia is controlled with diet & Atorvastatin. Patient denies myalgias or other med SE's. Last Lipids were at goal:  Lab Results  Component Value Date   CHOL 99 01/12/2020   HDL 50 01/12/2020   LDLCALC 36 01/12/2020   TRIG 51 01/12/2020   CHOLHDL 2.0 01/12/2020    Also, the patient has history of T2_NIDDM  (2008) w/ CKD2 (GFR 62) and after a Gastric Bipass  & 100# wt loss in 2013, her A1c's returned to Normal.  She  has had no symptoms of reactive hypoglycemia, diabetic polys, paresthesias or visual blurring.  Last A1c was Normal & at goal:  Lab Results  Component Value Date   HGBA1C 5.6 01/12/2020   This SmartLink has not been configured with any valid records.   Wt Readings from Last 3 Encounters:  05/10/20 165 lb 3.2 oz (74.9 kg)  01/12/20 181 lb (82.1 kg)  10/08/19 182 lb 6.4 oz (82.7 kg)        Patient was dx'd Hypothyroid in 2008 and  initiated on Thyroid Replacement.       Further, the patient also has history of Vitamin D Deficiency ("28" / 2008) and supplements vitamin D without any suspected side-effects. Last vitamin D was elevated and dose was tapered:  Lab Results  Component Value Date   VD25OH 126 (H)  01/12/2020    Current Outpatient Medications on File Prior to Visit  Medication Sig  . Ascorbic Acid (VITAMIN C) 500 MG CAPS Take 1 tablet by mouth daily.   Marland Kitchen aspirin 81 MG tablet Take 81 mg by mouth daily.  Marland Kitchen atorvastatin (LIPITOR) 40 MG tablet Take 1 tablet Daily for Cholesterol  . Calcium Citrate-Vitamin D (CALCIUM CITRATE + D3 PO) Take 600 mg by mouth daily.   . cyclobenzaprine (FLEXERIL) 10 MG tablet Take 1/2 to 1 tablet 3 x /day as needed for Muscle Spasm  . diphenhydramine-acetaminophen (TYLENOL PM) 25-500 MG TABS tablet Take 1 tablet by mouth at bedtime.   . EUTHYROX 50 MCG tablet TAKE 1 TABLET BY MOUTH ONCE DAILY ON  AN  EMPTY  STOMACH  WITH  WATER  ONLY  FOR  THYROID  . Ferrous Sulfate (SLOW RELEASE IRON PO) Take 1 tablet by mouth daily.   . fish oil-omega-3 fatty acids 1000 MG capsule Take 1 g by mouth daily.  . fluticasone (FLONASE) 50 MCG/ACT nasal spray Place 2 sprays into both nostrils daily as needed for allergies.  Marland Kitchen gabapentin (NEURONTIN) 100 MG capsule Take 1 capsule 4 x /day for Neuropathy pain  . Magnesium  250 MG TABS Take 1 tablet by mouth daily.   . Melatonin 10 MG TABS Take 1 tablet by mouth at bedtime.  . meloxicam (MOBIC) 15 MG tablet Take 1 tablet (15 mg total) by mouth daily as needed for pain.  . nitroGLYCERIN (NITROSTAT) 0.4 MG SL tablet Place 1 tablet (0.4 mg total) under the tongue every 5 (five) minutes as needed for chest pain.  . potassium chloride (K-DUR) 10 MEQ tablet Take 1 tablet 2 x /day  . rOPINIRole (REQUIP) 3 MG tablet Take 1 tablet at Bedtime for Restless Legs  . vitamin B-12 (CYANOCOBALAMIN) 1000 MCG tablet Take 1,000 mcg by mouth daily.   . [DISCONTINUED] olmesartan-hydrochlorothiazide (BENICAR HCT) 20-12.5 MG tablet Take 1 tablet Daily for BP   No current facility-administered medications on file prior to visit.    Allergies  Allergen Reactions  . Codeine Other (See Comments)    "puts me out of it".  Dysphoria.  Patient tolerates Tramadol  without complications.  . Flagyl [Metronidazole] Hives  . Nabumetone     GI upset    PMHx:   Past Medical History:  Diagnosis Date  . Allergic rhinitis   . Arthritis   . Cholecystitis, chronic   . Dysrhythmia    Hx bradycardia - hx normal cardiac cath x2 / ECHO 2009 EF 60% LAE MYOVIEW 3/12 EF 60% NO ISCHEMIA  . Hyperlipidemia   . Hypertension    no treatment after 120 lb weight loss  . Hypothyroidism   . Prediabetes   . Thyroid nodule     Immunization History  Administered Date(s) Administered  . Influenza Split 08/25/2013, 09/02/2014, 08/10/2015  . Influenza, High Dose Seasonal PF 07/31/2017, 08/20/2018, 07/23/2019  . Influenza,inj,quad, With Preservative 08/10/2016  . Moderna SARS-COVID-2 Vaccination 12/30/2019, 01/27/2020  . PPD Test 02/23/2014, 05/23/2016  . Pneumococcal Conjugate-13 07/31/2018  . Pneumococcal Polysaccharide-23 10/18/2008, 10/08/2019  . Td 08/08/2006  . Tdap 05/23/2016  . Zoster 10/15/2012    Past Surgical History:  Procedure Laterality Date  . Bilateral carpal tunnel surgery    . BREATH TEK H PYLORI  07/01/2012   Procedure: BREATH TEK H PYLORI;  Surgeon: Edward Jolly, MD;  Location: [facility];  Service: General;  Laterality: N/A;  . CHOLECYSTECTOMY N/A 11/27/2013   Procedure: LAPAROSCOPIC CHOLECYSTECTOMY WITH INTRAOPERATIVE CHOLANGIOGRAM;  Surgeon: Pedro Earls, MD;  Location: [facility];  Service: General;  Laterality: N/A;  . GASTRIC ROUX-EN-Y N/A 12/16/2012   Procedure: LAPAROSCOPIC ROUX-EN-Y GASTRIC BYPASS WITH UPPER ENDOSCOPY;  Surgeon: Pedro Earls, MD;  Location: [facility];  Service: General;  Laterality: N/A;  Gastric Bypass  . NASAL SINUS SURGERY    . REPLACEMENT TOTAL KNEE BILATERAL     bilat  . TUBAL LIGATION      FHx:    Reviewed / unchanged  SHx:    Reviewed / unchanged   Systems Review:  Constitutional: Denies fever, chills, wt changes, headaches, insomnia, fatigue, night sweats, change in appetite. Eyes: Denies  redness, blurred vision, diplopia, discharge, itchy, watery eyes.  ENT: Denies discharge, congestion, post nasal drip, epistaxis, sore throat, earache, hearing loss, dental pain, tinnitus, vertigo, sinus pain, snoring.  CV: Denies chest pain, palpitations, irregular heartbeat, syncope, dyspnea, diaphoresis, orthopnea, PND, claudication or edema. Respiratory: denies cough, dyspnea, DOE, pleurisy, hoarseness, laryngitis, wheezing.  Gastrointestinal: Denies dysphagia, odynophagia, heartburn, reflux, water brash, abdominal pain or cramps, nausea, vomiting, bloating, diarrhea, constipation, hematemesis, melena, hematochezia  or hemorrhoids. Genitourinary: Denies dysuria, frequency, urgency, nocturia, hesitancy, discharge, hematuria or flank pain.  Musculoskeletal: Denies arthralgias, myalgias, stiffness, jt. swelling, pain, limping or strain/sprain.  Skin: Denies pruritus, rash, hives, warts, acne, eczema or change in skin lesion(s). Neuro: No weakness, tremor, incoordination, spasms, paresthesia or pain. Psychiatric: Denies confusion, memory loss or sensory loss. Endo: Denies change in weight, skin or hair change.  Heme/Lymph: No excessive bleeding, bruising or enlarged lymph nodes.  Physical Exam  BP 110/60   P 60   T 97.4 F    R 16   Ht 5' 2.75"    Wt 165 lb 3.2 oz    BMI 29.50  Sit BP 125/59  P 51 and Stand BP 101/53  P 46.  Appears  over nourished, well groomed  and in no distress.  Eyes: PERRLA, EOMs, conjunctiva no swelling or erythema. Sinuses: No frontal/maxillary tenderness ENT/Mouth: EAC's clear, TM's nl w/o erythema, bulging. Nares clear w/o erythema, swelling, exudates. Oropharynx clear without erythema or exudates. Oral hygiene is good. Tongue normal, non obstructing. Hearing intact.  Neck: Supple. Thyroid not palpable. Car 2+/2+ without bruits, nodes or JVD. Chest: Respirations nl with BS clear & equal w/o rales, rhonchi, wheezing or stridor.  Cor: Heart sounds normal w/  regular rate and rhythm without sig. murmurs, gallops, clicks or rubs. Peripheral pulses normal and equal  without edema.  Abdomen: Soft & bowel sounds normal. Non-tender w/o guarding, rebound, hernias, masses or organomegaly.  Lymphatics: Unremarkable.  Musculoskeletal: Full ROM all peripheral extremities, joint stability, 5/5 strength and normal gait.  Skin: Warm, dry without exposed rashes, lesions or ecchymosis apparent.  Neuro: Cranial nerves intact, reflexes equal bilaterally. Sensory-motor testing grossly intact. Tendon reflexes grossly intact.  Pysch: Alert & oriented x 3.  Insight and judgement nl & appropriate. No ideations.  Assessment and Plan:  1. Essential hypertension  - D/c Benicar/Hct 7 Rx plain Benicar 20 mg  -  monitor blood pressure at home.  - Reminder to go to the ER if any CP,  SOB, nausea, dizziness, severe HA, changes vision/speech.  - CBC with Differential/Platelet - COMPLETE METABOLIC PANEL WITH GFR - Magnesium - TSH  2. Hyperlipidemia associated with type 2 diabetes mellitus ([place])  - Continue diet/meds, exercise,& lifestyle modifications.  - Continue monitor periodic cholesterol/liver & renal functions   - Lipid panel - TSH - Hemoglobin A1c - Insulin, random  3. Type 2 diabetes mellitus with stage 2 chronic kidney disease,  without long-term current use of insulin ([facility])  - Continue diet, exercise  - Lifestyle modifications.  - Monitor appropriate labs.  - Hemoglobin A1c - Insulin, random  4. Vitamin D deficiency  - Continue supplementation.  - VITAMIN D 25 Hydroxyl  5. Hypothyroidism  - TSH  6. Medication management  - CBC with Differential/Platelet - COMPLETE METABOLIC PANEL WITH GFR - Magnesium - Lipid panel - TSH - Hemoglobin A1c - Insulin, random - VITAMIN D 25 Hydroxy         Discussed  regular exercise, BP monitoring, weight control to achieve/maintain BMI less than 25 and discussed med and SE's. Recommended labs to  assess and monitor clinical status with further disposition pending results of labs.  I discussed the assessment and treatment plan with the patient. The patient was provided an opportunity to ask questions and all were answered. The patient agreed with the plan and demonstrated an understanding of the instructions.  I provided over 30 minutes of exam, counseling, chart review and  complex critical decision making.   Kirtland Bouchard, MD

## 2020-05-10 ENCOUNTER — Other Ambulatory Visit: Payer: Self-pay

## 2020-05-10 ENCOUNTER — Ambulatory Visit: Payer: Federal, State, Local not specified - PPO | Admitting: Internal Medicine

## 2020-05-10 VITALS — BP 110/60 | HR 60 | Temp 97.4°F | Resp 16 | Ht 62.75 in | Wt 165.2 lb

## 2020-05-10 DIAGNOSIS — Z79899 Other long term (current) drug therapy: Secondary | ICD-10-CM | POA: Diagnosis not present

## 2020-05-10 DIAGNOSIS — E559 Vitamin D deficiency, unspecified: Secondary | ICD-10-CM

## 2020-05-10 DIAGNOSIS — E785 Hyperlipidemia, unspecified: Secondary | ICD-10-CM

## 2020-05-10 DIAGNOSIS — K219 Gastro-esophageal reflux disease without esophagitis: Secondary | ICD-10-CM

## 2020-05-10 DIAGNOSIS — E1122 Type 2 diabetes mellitus with diabetic chronic kidney disease: Secondary | ICD-10-CM

## 2020-05-10 DIAGNOSIS — E1169 Type 2 diabetes mellitus with other specified complication: Secondary | ICD-10-CM

## 2020-05-10 DIAGNOSIS — N182 Chronic kidney disease, stage 2 (mild): Secondary | ICD-10-CM

## 2020-05-10 DIAGNOSIS — E039 Hypothyroidism, unspecified: Secondary | ICD-10-CM | POA: Diagnosis not present

## 2020-05-10 DIAGNOSIS — I10 Essential (primary) hypertension: Secondary | ICD-10-CM | POA: Diagnosis not present

## 2020-05-10 MED ORDER — OLMESARTAN MEDOXOMIL 20 MG PO TABS: ORAL_TABLET | ORAL | 1 refills | Status: DC

## 2020-05-10 MED ORDER — VITAMIN D (ERGOCALCIFEROL) 1.25 MG (50000 UNIT) PO CAPS: ORAL_CAPSULE | ORAL | 3 refills | Status: DC

## 2020-05-10 MED ORDER — ESOMEPRAZOLE MAGNESIUM 40 MG PO CPDR: DELAYED_RELEASE_CAPSULE | ORAL | 1 refills | Status: AC

## 2020-05-11 LAB — CBC WITH DIFFERENTIAL/PLATELET
Absolute Monocytes: 389 cells/uL (ref 200–950)
Basophils Absolute: 82 cells/uL (ref 0–200)
Basophils Relative: 1.7 %
Eosinophils Absolute: 422 cells/uL (ref 15–500)
Eosinophils Relative: 8.8 %
HCT: 37 % (ref 35.0–45.0)
Hemoglobin: 11.6 g/dL — ABNORMAL LOW (ref 11.7–15.5)
Lymphs Abs: 2050 cells/uL (ref 850–3900)
MCH: 29.9 pg (ref 27.0–33.0)
MCHC: 31.4 g/dL — ABNORMAL LOW (ref 32.0–36.0)
MCV: 95.4 fL (ref 80.0–100.0)
MPV: 11.7 fL (ref 7.5–12.5)
Monocytes Relative: 8.1 %
Neutro Abs: 1858 cells/uL (ref 1500–7800)
Neutrophils Relative %: 38.7 %
Platelets: 269 10*3/uL (ref 140–400)
RBC: 3.88 10*6/uL (ref 3.80–5.10)
RDW: 12.4 % (ref 11.0–15.0)
Total Lymphocyte: 42.7 %
WBC: 4.8 10*3/uL (ref 3.8–10.8)

## 2020-05-11 LAB — COMPLETE METABOLIC PANEL WITH GFR
AG Ratio: 1.7 (calc) (ref 1.0–2.5)
ALT: 25 U/L (ref 6–29)
AST: 31 U/L (ref 10–35)
Albumin: 4 g/dL (ref 3.6–5.1)
Alkaline phosphatase (APISO): 111 U/L (ref 37–153)
BUN: 15 mg/dL (ref 7–25)
CO2: 26 mmol/L (ref 20–32)
Calcium: 9.1 mg/dL (ref 8.6–10.4)
Chloride: 106 mmol/L (ref 98–110)
Creat: 0.99 mg/dL (ref 0.50–0.99)
GFR, Est African American: 68 mL/min/{1.73_m2} (ref >=60)
GFR, Est Non African American: 59 mL/min/{1.73_m2} — ABNORMAL LOW (ref >=60)
Globulin: 2.4 g/dL (calc) (ref 1.9–3.7)
Glucose, Bld: 95 mg/dL (ref 65–99)
Potassium: 4.1 mmol/L (ref 3.5–5.3)
Sodium: 141 mmol/L (ref 135–146)
Total Bilirubin: 1.4 mg/dL — ABNORMAL HIGH (ref 0.2–1.2)
Total Protein: 6.4 g/dL (ref 6.1–8.1)

## 2020-05-11 LAB — LIPID PANEL
Cholesterol: 107 mg/dL (ref <200)
HDL: 62 mg/dL (ref >=50)
LDL Cholesterol (Calc): 34 mg/dL (calc)
Non-HDL Cholesterol (Calc): 45 mg/dL (calc) (ref <130)
Total CHOL/HDL Ratio: 1.7 (calc) (ref <5.0)
Triglycerides: 40 mg/dL (ref <150)

## 2020-05-11 LAB — MAGNESIUM: Magnesium: 2.1 mg/dL (ref 1.5–2.5)

## 2020-05-11 LAB — HEMOGLOBIN A1C
Hgb A1c MFr Bld: 5.5 % of total Hgb (ref <5.7)
Mean Plasma Glucose: 111 (calc)
eAG (mmol/L): 6.2 (calc)

## 2020-05-11 LAB — TSH: TSH: 1.09 mIU/L (ref 0.40–4.50)

## 2020-05-11 LAB — VITAMIN D 25 HYDROXY (VIT D DEFICIENCY, FRACTURES): Vit D, 25-Hydroxy: 135 ng/mL — ABNORMAL HIGH (ref 30–100)

## 2020-05-11 LAB — INSULIN, RANDOM: Insulin: 4 u[IU]/mL

## 2020-05-11 NOTE — Progress Notes (Signed)
===========================================================  -    CBC shows  mild chronic Anemia  ===========================================================  -  Total Chol = 107 and LDL Chol = 34 - Both  Excellent   - Very low risk for Heart Attack  / Stroke =============================================================  - A1c - Normal - Great  ===========================================================  -  Vitamin D = 135 is too high   - Recommend stop the 50,000 unit capsules and leave off for 1 week  - When resume in 1 week, recommend that you Only take 10,000 units every day   - ie., Take vitamin D 5,000 unit capsules and take 2 capsules = 10,000 units every day ===========================================================  -  All Else - CBC - Kidneys - Electrolytes - Liver - Magnesium & Thyroid    - all  Normal / OK ====================================================

## 2020-07-13 ENCOUNTER — Other Ambulatory Visit: Payer: Self-pay | Admitting: Internal Medicine

## 2020-08-01 ENCOUNTER — Other Ambulatory Visit: Payer: Self-pay | Admitting: Internal Medicine

## 2020-08-01 DIAGNOSIS — E785 Hyperlipidemia, unspecified: Secondary | ICD-10-CM

## 2020-08-10 ENCOUNTER — Ambulatory Visit: Payer: Federal, State, Local not specified - PPO | Admitting: Adult Health

## 2020-08-22 ENCOUNTER — Encounter: Payer: Self-pay | Admitting: Adult Health

## 2020-08-22 NOTE — Progress Notes (Signed)
FOLLOW UP  Assessment and Plan:  Aortic atherosclerosis (HCC) Control blood pressure, cholesterol, glucose, increase exercise.   CAD Dr. Percival Spanish following Control blood pressure, cholesterol, glucose, increase exercise.  Denies angina, dyspnea  Essential hypertension - continue medications, DASH diet, exercise and monitor at home. Call if greater than 130/80.  -     CBC with Differential/Platelet -     COMPLETE METABOLIC PANEL WITH GFR -     TSH  Hypothyroidism, unspecified type -check TSH level, continue medications the same, reminded to take on an empty stomach 30-44mins before food.  -     TSH  Hyperlipidemia LDL goal <70 Continue atorvastatin for LDL goal <70 decrease fatty foods increase activity.  -     Lipid panel  Medication management -     Magnesium  Vitamin D deficiency Above goal; hasn't changed dose Advised stop supplement for 1 month, then reduce to taking twice weekly, recheck next visit  Abnormal glucose Recent A1Cs at goal Discussed diet/exercise, weight management  Defer A1C; check CMP  Need for influenza vaccine High dose quadrivalent administered without complication today   Epigastric pain with tenderness Suspect gastric etiology but will check CMP, lipase, CBC No gallbladder Avoid NSAIDs, ETOH, reduce caffeine If negative try carafate QID x 2-4 weeks; if no improvement follow up GI Dr. Silverio Decamp to recheck for gastric ulcer and r/o H. pylori    Continue diet and meds as discussed. Further disposition pending results of labs. Discussed med's effects and SE's.   Over 30 minutes of exam, counseling, chart review, and critical decision making was performed.   Future Appointments  Date Time Provider [place]  11/07/2020 10:00 AM [name], MD [facility] None    ----------------------------------------------------------------------------------------------------------------------  HPI 69 y.o. right handed female  presents  for 3 month follow up on hypertension, cholesterol, glucose management, weight (s/p roux-en-y) and vitamin D deficiency.   She has history of allergies, does mucinex, flonase, discussed antihistamine today.   She has hx of GERD/hiatal hernia, was diagnosed with H.pylori + ulcer 12/17, and again in 09/2017. She had EGD in 02/2018 which was unremarkable with neg H. Pylori.  Currently taking nexium 40 mg daily. She does report has been having intermittent epigastric pain that seems progressive in the last 3 months, can have after eating or drinking, hasn't noted with lying supine. Sx resolve fairly quickly within a few min, but severe pain and occasionally will have nausea. Denies sense of breakthrough reflux. Denies bowel changes, fatty stools, dark stools. She had cholecystectomy. Hasn't tried any interventions. Reports occasional ibuprofen use. Denies ETOH. Had colonoscopy with adenomatous polyps 04/2018 recommended 5 year follow up.   BMI is Body mass index is 29.28 kg/m., she has been working on diet and exercise, watching portions, cutting back on sweet tea and no more sugar in tea. She is active but not intentionally exercising. 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 She has aortic atherosclerosis per CT 01/2017.   Her blood pressure has been controlled at home, today their BP is BP: 118/70  She does workout. She denies chest pain, shortness of breath, dizziness.   She is on cholesterol medication (atorvastatin 40 mg daily) and denies  myalgias. Her cholesterol is at goal. The cholesterol last visit was:   Lab Results  Component Value Date   CHOL 107 05/10/2020   HDL 62 05/10/2020   LDLCALC 34 05/10/2020   TRIG 40  05/10/2020   CHOLHDL 1.7 05/10/2020    She has been working on diet and exercise for glucose management, and denies increased appetite, nausea, paresthesia of the feet, polydipsia, polyuria, visual disturbances, vomiting and weight loss. Last A1C in the office was:  Lab Results  Component Value Date   HGBA1C 5.5 05/10/2020   She is on thyroid medication, not on biotin. Her medication was not changed last visit.   Lab Results  Component Value Date   TSH 1.09 05/10/2020   Patient is on Vitamin D supplement hasn't changed dose, reports taking 50000 IU every other day   Lab Results  Component Value Date   VD25OH 135 (H) 05/10/2020       Current Medications:  Current Outpatient Medications on File Prior to Visit  Medication Sig  . Ascorbic Acid (VITAMIN C) 500 MG CAPS Take 1 tablet by mouth daily.   Marland Kitchen aspirin 81 MG tablet Take 81 mg by mouth daily.  Marland Kitchen atorvastatin (LIPITOR) 40 MG tablet Take     1 tablet     Daily       for Cholesterol  . Calcium Citrate-Vitamin D (CALCIUM CITRATE + D3 PO) Take 600 mg by mouth daily.   . cyclobenzaprine (FLEXERIL) 10 MG tablet Take 1/2 to 1 tablet 3 x /day as needed for Muscle Spasm  . diphenhydramine-acetaminophen (TYLENOL PM) 25-500 MG TABS tablet Take 1 tablet by mouth at bedtime.   Marland Kitchen esomeprazole (NEXIUM) 40 MG capsule Take 1 capsule Daily for Heartburn & Indigestion  . Ferrous Sulfate (SLOW RELEASE IRON PO) Take 1 tablet by mouth daily.   . fish oil-omega-3 fatty acids 1000 MG capsule Take 1 g by mouth daily.  . fluticasone (FLONASE) 50 MCG/ACT nasal spray Place 2 sprays into both nostrils daily as needed for allergies.  Marland Kitchen gabapentin (NEURONTIN) 100 MG capsule Take 1 capsule 4 x /day for Neuropathy pain  . levothyroxine (EUTHYROX) 50 MCG tablet Take 1 tablet daily on an empty stomach with only water for 30 minutes & no Antacid meds, Calcium or Magnesium for 4 hours & avoid Biotin  . Magnesium 250 MG TABS Take 1 tablet by mouth daily.   . Melatonin  10 MG TABS Take 1 tablet by mouth at bedtime.  . meloxicam (MOBIC) 15 MG tablet Take 1 tablet (15 mg total) by mouth daily as needed for pain.  . nitroGLYCERIN (NITROSTAT) 0.4 MG SL tablet Place 1 tablet (0.4 mg total) under the tongue every 5 (five) minutes as needed for chest pain.  Marland Kitchen olmesartan (BENICAR) 20 MG tablet Take 1 tablet Daily for BP  . potassium chloride (K-DUR) 10 MEQ tablet Take 1 tablet 2 x /day  . rOPINIRole (REQUIP) 3 MG tablet Take 1 tablet at Bedtime for Restless Legs  . vitamin B-12 (CYANOCOBALAMIN) 1000 MCG tablet Take 1,000 mcg by mouth daily.   . Vitamin D, Ergocalciferol, (DRISDOL) 1.25 MG (50000 UNIT) CAPS capsule Take 1 capsule every other day for Severe Vitamin D Deficiency  . [DISCONTINUED] olmesartan-hydrochlorothiazide (BENICAR HCT) 20-12.5 MG tablet Take 1 tablet Daily for BP   No current facility-administered medications on file prior to visit.     Allergies:  Allergies  Allergen Reactions  . Codeine Other (See Comments)    "puts me out  of it".  Dysphoria.  Patient tolerates Tramadol without complications.  . Flagyl [Metronidazole] Hives  . Nabumetone     GI upset     Medical History:  Past Medical History:  Diagnosis Date  . Allergic rhinitis   . Arthritis   . Cholecystitis, chronic   . Dysrhythmia    Hx bradycardia - hx normal cardiac cath x2 / ECHO 2009 EF 60% LAE MYOVIEW 3/12 EF 60% NO ISCHEMIA  . Gastrointestinal ulcer due to Helicobacter pylori 86/38/1771  . Hyperlipidemia   . Hypertension    no treatment after 120 lb weight loss  . Hypothyroidism   . Prediabetes   . Thyroid nodule    Family history- Reviewed and unchanged Social history- Reviewed and unchanged   Review of Systems:  Review of Systems  Constitutional: Negative for malaise/fatigue and weight loss.  HENT: Negative for congestion, hearing loss, sinus pain, sore throat and tinnitus.   Eyes: Negative for blurred vision and double vision.  Respiratory: Negative for  cough, shortness of breath and wheezing.   Cardiovascular: Negative for chest pain, palpitations, orthopnea, claudication and leg swelling.  Gastrointestinal: Positive for abdominal pain (epigastric, intermittent over last 2-3 months following oral intake, brief). Negative for blood in stool, constipation, diarrhea, heartburn, melena, nausea and vomiting.  Genitourinary: Negative.   Musculoskeletal: Negative for joint pain and myalgias.  Skin: Negative for rash.  Neurological: Negative for dizziness, tingling, sensory change, weakness and headaches.  Endo/Heme/Allergies: Negative for environmental allergies and polydipsia.  Psychiatric/Behavioral: Negative.  Negative for depression. The patient is not nervous/anxious and does not have insomnia.   All other systems reviewed and are negative.   Physical Exam: BP 118/70   Pulse (!) 54   Temp (!) 96.9 F (36.1 C)   Wt 164 lb (74.4 kg)   LMP  (LMP Unknown)   SpO2 99%   BMI 29.28 kg/m  Wt Readings from Last 3 Encounters:  08/23/20 164 lb (74.4 kg)  05/10/20 165 lb 3.2 oz (74.9 kg)  01/12/20 181 lb (82.1 kg)   General Appearance: Well nourished, in no apparent distress. Eyes: PERRLA, EOMs, conjunctiva no swelling or erythema Sinuses: No Frontal tenderness, mild L sided maxillary tenderness ENT/Mouth: Ext aud canals clear, TMs without erythema, bulging. No erythema, swelling, or exudate on post pharynx.  Tonsils not swollen or erythematous. Hearing normal.  Neck: Supple, thyroid normal.  Respiratory: Respiratory effort normal, BS equal bilaterally without rales, rhonchi, wheezing or stridor.  Cardio: RRR with no MRGs. Brisk peripheral pulses without edema.  Abdomen: Soft, + BS.  She has epigastric and RUQ tenderness, no guarding, rebound, hernias, masses. Lymphatics: Non tender without lymphadenopathy.  Musculoskeletal: Full ROM, 5/5 strength, Normal gait.  Skin: Warm, dry, intact without rash, concerning lesions, ecchymosis.  Neuro:  Cranial nerves intact. No cerebellar symptoms.  Psych: Awake and oriented X 3, normal affect, Insight and Judgment appropriate.    Tracy Ribas, NP 9:37 AM [facility]

## 2020-08-23 ENCOUNTER — Encounter: Payer: Self-pay | Admitting: Adult Health

## 2020-08-23 ENCOUNTER — Other Ambulatory Visit: Payer: Self-pay

## 2020-08-23 ENCOUNTER — Ambulatory Visit: Payer: Federal, State, Local not specified - PPO | Admitting: Adult Health

## 2020-08-23 VITALS — BP 118/70 | HR 54 | Temp 96.9°F | Wt 164.0 lb

## 2020-08-23 DIAGNOSIS — Z79899 Other long term (current) drug therapy: Secondary | ICD-10-CM | POA: Diagnosis not present

## 2020-08-23 DIAGNOSIS — E785 Hyperlipidemia, unspecified: Secondary | ICD-10-CM | POA: Diagnosis not present

## 2020-08-23 DIAGNOSIS — E039 Hypothyroidism, unspecified: Secondary | ICD-10-CM

## 2020-08-23 DIAGNOSIS — E559 Vitamin D deficiency, unspecified: Secondary | ICD-10-CM

## 2020-08-23 DIAGNOSIS — R1013 Epigastric pain: Secondary | ICD-10-CM | POA: Diagnosis not present

## 2020-08-23 DIAGNOSIS — I700 Atherosclerosis of aorta: Secondary | ICD-10-CM | POA: Diagnosis not present

## 2020-08-23 DIAGNOSIS — Z23 Encounter for immunization: Secondary | ICD-10-CM | POA: Diagnosis not present

## 2020-08-23 DIAGNOSIS — D509 Iron deficiency anemia, unspecified: Secondary | ICD-10-CM

## 2020-08-23 DIAGNOSIS — I10 Essential (primary) hypertension: Secondary | ICD-10-CM

## 2020-08-23 DIAGNOSIS — I2510 Atherosclerotic heart disease of native coronary artery without angina pectoris: Secondary | ICD-10-CM

## 2020-08-23 DIAGNOSIS — Z860100 Personal history of colon polyps, unspecified: Secondary | ICD-10-CM

## 2020-08-23 DIAGNOSIS — E663 Overweight: Secondary | ICD-10-CM

## 2020-08-23 DIAGNOSIS — K219 Gastro-esophageal reflux disease without esophagitis: Secondary | ICD-10-CM

## 2020-08-23 MED ORDER — VITAMIN D (ERGOCALCIFEROL) 1.25 MG (50000 UNIT) PO CAPS: ORAL_CAPSULE | ORAL | 3 refills | Status: DC

## 2020-08-23 MED ORDER — SUCRALFATE 1 G PO TABS: 1.0000 g | ORAL_TABLET | Freq: Four times a day (QID) | ORAL | 0 refills | Status: DC

## 2020-08-23 NOTE — Patient Instructions (Addendum)
Goals    . Blood Pressure < 130/80    . DIET - REDUCE SUGAR INTAKE    . Exercise 150 min/wk Moderate Activity     Aim for at least 15 min of brisk walking daily     . LDL CALC < 70    . Weight (lb) < 160 lb (72.6 kg)       Consider trying stevia - instead of sweet and low   Claritin, allegra (fexofenadine), zyrtec - 1 tab once daily   Recommend limiting caffeine (tea), alcohol and avoiding NSAIDs (ibuprofen, aleve, goody powders, etc) until stomach is fully resolved      Abdominal Pain, Adult Pain in the abdomen (abdominal pain) can be caused by many things. Often, abdominal pain is not serious and it gets better with no treatment or by being treated at home. However, sometimes abdominal pain is serious. Your health care provider will ask questions about your medical history and do a physical exam to try to determine the cause of your abdominal pain. Follow these instructions at home:  Medicines  Take over-the-counter and prescription medicines only as told by your health care provider.  Do not take a laxative unless told by your health care provider. General instructions  Watch your condition for any changes.  Drink enough fluid to keep your urine pale yellow.  Keep all follow-up visits as told by your health care provider. This is important. Contact a health care provider if:  Your abdominal pain changes or gets worse.  You are not hungry or you lose weight without trying.  You are constipated or have diarrhea for more than 2-3 days.  You have pain when you urinate or have a bowel movement.  Your abdominal pain wakes you up at night.  Your pain gets worse with meals, after eating, or with certain foods.  You are vomiting and cannot keep anything down.  You have a fever.  You have blood in your urine. Get help right away if:  Your pain does not go away as soon as your health care provider told you to expect.  You cannot stop vomiting.  Your pain is only  in areas of the abdomen, such as the right side or the left lower portion of the abdomen. Pain on the right side could be caused by appendicitis.  You have bloody or black stools, or stools that look like tar.  You have severe pain, cramping, or bloating in your abdomen.  You have signs of dehydration, such as: ? Dark urine, very little urine, or no urine. ? Cracked lips. ? Dry mouth. ? Sunken eyes. ? Sleepiness. ? Weakness.  You have trouble breathing or chest pain. Summary  Often, abdominal pain is not serious and it gets better with no treatment or by being treated at home. However, sometimes abdominal pain is serious.  Watch your condition for any changes.  Take over-the-counter and prescription medicines only as told by your health care provider.  Contact a health care provider if your abdominal pain changes or gets worse.  Get help right away if you have severe pain, cramping, or bloating in your abdomen. This information is not intended to replace advice given to you by your health care provider. Make sure you discuss any questions you have with your health care provider. Document Revised: 02/23/2019 Document Reviewed: 02/23/2019 Elsevier Patient Education  [place].

## 2020-08-24 ENCOUNTER — Other Ambulatory Visit: Payer: Self-pay | Admitting: Adult Health

## 2020-08-24 DIAGNOSIS — N289 Disorder of kidney and ureter, unspecified: Secondary | ICD-10-CM

## 2020-08-24 DIAGNOSIS — E785 Hyperlipidemia, unspecified: Secondary | ICD-10-CM

## 2020-08-24 LAB — TSH: TSH: 1.84 mIU/L (ref 0.40–4.50)

## 2020-08-24 LAB — COMPLETE METABOLIC PANEL WITH GFR
AG Ratio: 1.4 (calc) (ref 1.0–2.5)
ALT: 31 U/L — ABNORMAL HIGH (ref 6–29)
AST: 34 U/L (ref 10–35)
Albumin: 3.9 g/dL (ref 3.6–5.1)
Alkaline phosphatase (APISO): 113 U/L (ref 37–153)
BUN/Creatinine Ratio: 14 (calc) (ref 6–22)
BUN: 17 mg/dL (ref 7–25)
CO2: 26 mmol/L (ref 20–32)
Calcium: 9.6 mg/dL (ref 8.6–10.4)
Chloride: 105 mmol/L (ref 98–110)
Creat: 1.18 mg/dL — ABNORMAL HIGH (ref 0.50–0.99)
GFR, Est African American: 55 mL/min/{1.73_m2} — ABNORMAL LOW (ref >=60)
GFR, Est Non African American: 47 mL/min/{1.73_m2} — ABNORMAL LOW (ref >=60)
Globulin: 2.7 g/dL (calc) (ref 1.9–3.7)
Glucose, Bld: 88 mg/dL (ref 65–99)
Potassium: 5.2 mmol/L (ref 3.5–5.3)
Sodium: 138 mmol/L (ref 135–146)
Total Bilirubin: 1.3 mg/dL — ABNORMAL HIGH (ref 0.2–1.2)
Total Protein: 6.6 g/dL (ref 6.1–8.1)

## 2020-08-24 LAB — LIPID PANEL
Cholesterol: 106 mg/dL (ref <200)
HDL: 62 mg/dL (ref >=50)
LDL Cholesterol (Calc): 31 mg/dL (calc)
Non-HDL Cholesterol (Calc): 44 mg/dL (calc) (ref <130)
Total CHOL/HDL Ratio: 1.7 (calc) (ref <5.0)
Triglycerides: 47 mg/dL (ref <150)

## 2020-08-24 LAB — CBC WITH DIFFERENTIAL/PLATELET
Absolute Monocytes: 492 cells/uL (ref 200–950)
Basophils Absolute: 72 cells/uL (ref 0–200)
Basophils Relative: 1.2 %
Eosinophils Absolute: 420 cells/uL (ref 15–500)
Eosinophils Relative: 7 %
HCT: 36.2 % (ref 35.0–45.0)
Hemoglobin: 11.8 g/dL (ref 11.7–15.5)
Lymphs Abs: 2868 cells/uL (ref 850–3900)
MCH: 30.6 pg (ref 27.0–33.0)
MCHC: 32.6 g/dL (ref 32.0–36.0)
MCV: 94 fL (ref 80.0–100.0)
MPV: 11.1 fL (ref 7.5–12.5)
Monocytes Relative: 8.2 %
Neutro Abs: 2148 cells/uL (ref 1500–7800)
Neutrophils Relative %: 35.8 %
Platelets: 273 10*3/uL (ref 140–400)
RBC: 3.85 10*6/uL (ref 3.80–5.10)
RDW: 11.9 % (ref 11.0–15.0)
Total Lymphocyte: 47.8 %
WBC: 6 10*3/uL (ref 3.8–10.8)

## 2020-08-24 LAB — LIPASE: Lipase: 50 U/L (ref 7–60)

## 2020-08-24 LAB — MAGNESIUM: Magnesium: 2 mg/dL (ref 1.5–2.5)

## 2020-08-24 MED ORDER — ATORVASTATIN CALCIUM 20 MG PO TABS: ORAL_TABLET | ORAL | 1 refills | Status: DC

## 2020-08-29 ENCOUNTER — Telehealth: Payer: Self-pay

## 2020-08-29 ENCOUNTER — Other Ambulatory Visit: Payer: Self-pay | Admitting: Adult Health

## 2020-08-29 NOTE — Telephone Encounter (Signed)
Patient is requesting a referral to a specialist to look at a mole on her neck.

## 2020-08-30 NOTE — Telephone Encounter (Signed)
Patient states that she has had this mole, located at the nape of her neck, for several years. The size is changing, getting bigger. Patient made an appointment to be seen here in our office.

## 2020-08-30 NOTE — Telephone Encounter (Signed)
Left message on voice mail  to call back

## 2020-09-02 NOTE — Progress Notes (Signed)
Assessment and Plan:  Tracy Paul was seen today for nevus.  Diagnoses and all orders for this visit:  Change in nevus Questionable change in long standing stable nevi;  Dr. Melford Aase was able to look at lesion, recommended cryo therapy then observation  3 freeze and thaw technique after verbal permission Tolerated well; aftercare discussed; protect from sun, monitor for any signs of infection, expect to scab and fall off over next 1-2 weeks  monitor location for recurrence or changes; biopsy if recurrent Follow up as scheduled or sooner if needed;  Continue with protecting skin, routine monitoring for concerning nevi   Essential hypertension Continue medication Monitor blood pressure at home; call if consistently over 130/80 Continue DASH diet.   Reminder to go to the ER if any CP, SOB, nausea, dizziness, severe HA, changes vision/speech, left arm numbness and tingling and jaw pain.  Further disposition pending results of labs. Discussed med's effects and SE's.   Over 15 minutes of exam, counseling, chart review, and critical decision making was performed.   Future Appointments  Date Time Provider [place] [place]  12/06/2020  2:00 PM [name], MD [facility] None    ------------------------------------------------------------------------------------------------------------------   HPI BP 130/66   Pulse (!) 59   Temp (!) 97.5 F (36.4 C)   Wt 171 lb (77.6 kg)   LMP  (LMP Unknown)   SpO2 98%   BMI 30.53 kg/m   69 y.o.female without personal or family history presents for evaluation of possible changing nevi. She reports has had nevus to R shoulder for many years without changes, recently noted was itching and husband noted localized irritation. Wanted to get checked to be sure.    Past Medical History:  Diagnosis Date  . Allergic rhinitis   . Arthritis   . Cholecystitis, chronic   . Dysrhythmia    Hx bradycardia - hx normal cardiac cath x2 / ECHO 2009 EF 60% LAE  MYOVIEW 3/12 EF 60% NO ISCHEMIA  . Gastrointestinal ulcer due to Helicobacter pylori 01/74/9449  . Hyperlipidemia   . Hypertension    no treatment after 120 lb weight loss  . Hypothyroidism   . Prediabetes   . Thyroid nodule      Allergies  Allergen Reactions  . Codeine Other (See Comments)    "puts me out of it".  Dysphoria.  Patient tolerates Tramadol without complications.  . Flagyl [Metronidazole] Hives  . Nabumetone     GI upset    Current Outpatient Medications on File Prior to Visit  Medication Sig  . Ascorbic Acid (VITAMIN C) 500 MG CAPS Take 1 tablet by mouth daily.   Marland Kitchen aspirin 81 MG tablet Take 81 mg by mouth daily.  Marland Kitchen atorvastatin (LIPITOR) 20 MG tablet Take     1 tablet     Daily       for Cholesterol  . Calcium Citrate-Vitamin D (CALCIUM CITRATE + D3 PO) Take 600 mg by mouth daily.   . cyclobenzaprine (FLEXERIL) 10 MG tablet Take 1/2 to 1 tablet 3 x /day as needed for Muscle Spasm  . diphenhydramine-acetaminophen (TYLENOL PM) 25-500 MG TABS tablet Take 1 tablet by mouth at bedtime.   Marland Kitchen esomeprazole (NEXIUM) 40 MG capsule Take 1 capsule Daily for Heartburn & Indigestion  . Ferrous Sulfate (SLOW RELEASE IRON PO) Take 1 tablet by mouth daily.   . fish oil-omega-3 fatty acids 1000 MG capsule Take 1 g by mouth daily.  . fluticasone (FLONASE) 50 MCG/ACT nasal spray Place 2 sprays into both nostrils  daily as needed for allergies.  Marland Kitchen gabapentin (NEURONTIN) 100 MG capsule Take 1 capsule 4 x /day for Neuropathy pain  . levothyroxine (EUTHYROX) 50 MCG tablet Take 1 tablet daily on an empty stomach with only water for 30 minutes & no Antacid meds, Calcium or Magnesium for 4 hours & avoid Biotin  . Magnesium 250 MG TABS Take 1 tablet by mouth daily.   . Melatonin 10 MG TABS Take 1 tablet by mouth at bedtime.  . meloxicam (MOBIC) 15 MG tablet Take 1 tablet (15 mg total) by mouth daily as needed for pain.  . nitroGLYCERIN (NITROSTAT) 0.4 MG SL tablet Place 1 tablet (0.4 mg total)  under the tongue every 5 (five) minutes as needed for chest pain.  Marland Kitchen olmesartan (BENICAR) 20 MG tablet Take 1 tablet Daily for BP  . potassium chloride (K-DUR) 10 MEQ tablet Take 1 tablet 2 x /day  . rOPINIRole (REQUIP) 3 MG tablet Take 1 tablet at Bedtime for Restless Legs  . sucralfate (CARAFATE) 1 g tablet Take 1 tablet (1 g total) by mouth 4 (four) times daily. 30 min prior to meals and bedtime. Can dissolve in a few oz of water to help coat esophagus if needed.  . vitamin B-12 (CYANOCOBALAMIN) 1000 MCG tablet Take 1,000 mcg by mouth daily.   . Vitamin D, Ergocalciferol, (DRISDOL) 1.25 MG (50000 UNIT) CAPS capsule Take 1 capsule twice weekly for Severe Vitamin D Deficiency  . [DISCONTINUED] olmesartan-hydrochlorothiazide (BENICAR HCT) 20-12.5 MG tablet Take 1 tablet Daily for BP   No current facility-administered medications on file prior to visit.    Allergies:  Allergies  Allergen Reactions  . Codeine Other (See Comments)    "puts me out of it".  Dysphoria.  Patient tolerates Tramadol without complications.  . Flagyl [Metronidazole] Hives  . Nabumetone     GI upset   Family History:  Herfamily history includes Anuerysm in her mother; Breast cancer in her mother; Cancer in her mother; Heart attack (age of onset: 80) in her father and sister.   ROS: all negative except above.   Physical Exam:  BP 130/66   Pulse (!) 59   Temp (!) 97.5 F (36.4 C)   Wt 171 lb (77.6 kg)   LMP  (LMP Unknown)   SpO2 98%   BMI 30.53 kg/m   General Appearance: Well nourished, in no apparent distress. Eyes: conjunctiva no swelling or erythema ENT/Mouth: Mask in place; Hearing normal.  Neck: Supple Respiratory: Respiratory effort normal Cardio: Appears well perfused Lymphatics: Non tender without lymphadenopathy.  Musculoskeletal: no obvious deformity, normal gait.  Skin: Warm, dry without rashes, ecchymosis. She has approx 8 mm pigmented lesion to R shoulder; light brown edges with some  darker brown to center, with mild scaly/warty appearance, no erythema, ulceration Neuro: Normal muscle tone, Sensation intact.  Psych: Awake and oriented X 3, normal affect, Insight and Judgment appropriate.     Izora Ribas, NP 12:15 PM [facility]

## 2020-09-05 ENCOUNTER — Ambulatory Visit (INDEPENDENT_AMBULATORY_CARE_PROVIDER_SITE_OTHER): Payer: Federal, State, Local not specified - PPO | Admitting: Adult Health

## 2020-09-05 ENCOUNTER — Other Ambulatory Visit: Payer: Self-pay

## 2020-09-05 ENCOUNTER — Other Ambulatory Visit: Payer: Self-pay | Admitting: Adult Health

## 2020-09-05 ENCOUNTER — Other Ambulatory Visit: Payer: Self-pay | Admitting: Internal Medicine

## 2020-09-05 ENCOUNTER — Encounter: Payer: Self-pay | Admitting: Adult Health

## 2020-09-05 VITALS — BP 130/66 | HR 59 | Temp 97.5°F | Wt 171.0 lb

## 2020-09-05 DIAGNOSIS — D229 Melanocytic nevi, unspecified: Secondary | ICD-10-CM | POA: Diagnosis not present

## 2020-09-05 DIAGNOSIS — Z1231 Encounter for screening mammogram for malignant neoplasm of breast: Secondary | ICD-10-CM

## 2020-09-05 DIAGNOSIS — I10 Essential (primary) hypertension: Secondary | ICD-10-CM

## 2020-09-05 NOTE — Patient Instructions (Addendum)
HOW TO SCHEDULE A MAMMOGRAM  [facility]  7 a.m.-6:30 p.m., Monday 7 a.m.-5 p.m., Tuesday-Friday Schedule an appointment by calling [phone].  [facility] Schedule an appointment by calling [phone].      Cryosurgery for Skin Conditions, Care After These instructions give you information on caring for yourself after your procedure. Your doctor may also give you more specific instructions. Call your doctor if you have any problems or questions after your procedure. Follow these instructions at home: Caring for the treated area   Follow instructions from your doctor about how to take care of the treated area. Make sure you: ? Keep the area covered with a bandage (dressing) until it heals, or for as long as told by your doctor. ? Wash your hands with soap and water before you change your bandage. If you do not have soap and water, use hand sanitizer. ? Change your bandage as told by your doctor. ? Keep the bandage and the treated area clean and dry. If the bandage gets wet, change it right away. ? Clean the treated area with soap and water.  Check the treated area every day for signs of infection. Check for: ? More redness, swelling, or pain. ? More fluid or blood. ? Warmth. ? Pus or a bad smell. General instructions  Do not pick at your blister. Do not try to break it open. This can cause infection and scarring.  Do not put any medicine, cream, or lotion on the treated area unless told by your doctor.  Take over-the-counter and prescription medicines only as told by your doctor.  Keep all follow-up visits as told by your doctor. This is important. Contact a doctor if:  You have more redness, swelling, or pain around the treated area.  You have more fluid or blood coming from the treated area.  The treated area feels warm to the touch.  You have pus or a bad smell coming from the treated area.  Your blister gets large and  painful. Get help right away if:  You have a fever and have redness spreading from the treated area. Summary  You should keep the treated area and your bandage clean and dry.  Check the treated area every day for signs of infection. Signs include fluid, pus, warmth, or having more redness, swelling, or pain.  Do not pick at your blister. Do not try to break it open. This information is not intended to replace advice given to you by your health care provider. Make sure you discuss any questions you have with your health care provider. Document Revised: 09/27/2017 Document Reviewed: 09/03/2016 Elsevier Patient Education  2020 [employer].

## 2020-09-08 ENCOUNTER — Other Ambulatory Visit: Payer: Self-pay | Admitting: Internal Medicine

## 2020-09-08 DIAGNOSIS — G2581 Restless legs syndrome: Secondary | ICD-10-CM

## 2020-10-12 ENCOUNTER — Other Ambulatory Visit: Payer: Self-pay

## 2020-10-12 ENCOUNTER — Ambulatory Visit (INDEPENDENT_AMBULATORY_CARE_PROVIDER_SITE_OTHER): Payer: Federal, State, Local not specified - PPO | Admitting: Adult Health Nurse Practitioner

## 2020-10-12 ENCOUNTER — Encounter: Payer: Self-pay | Admitting: Adult Health Nurse Practitioner

## 2020-10-12 VITALS — BP 126/72 | HR 76 | Temp 97.7°F | Ht 62.0 in | Wt 165.4 lb

## 2020-10-12 DIAGNOSIS — Z1152 Encounter for screening for COVID-19: Secondary | ICD-10-CM | POA: Diagnosis not present

## 2020-10-12 DIAGNOSIS — H9202 Otalgia, left ear: Secondary | ICD-10-CM | POA: Diagnosis not present

## 2020-10-12 DIAGNOSIS — J40 Bronchitis, not specified as acute or chronic: Secondary | ICD-10-CM | POA: Diagnosis not present

## 2020-10-12 DIAGNOSIS — R059 Cough, unspecified: Secondary | ICD-10-CM | POA: Diagnosis not present

## 2020-10-12 DIAGNOSIS — J0140 Acute pansinusitis, unspecified: Secondary | ICD-10-CM | POA: Diagnosis not present

## 2020-10-12 LAB — POC COVID19 BINAXNOW: SARS Coronavirus 2 Ag: NEGATIVE

## 2020-10-12 MED ORDER — PROMETHAZINE-DM 6.25-15 MG/5ML PO SYRP: 5.0000 mL | ORAL_SOLUTION | Freq: Four times a day (QID) | ORAL | 1 refills | Status: DC | PRN

## 2020-10-12 MED ORDER — DEXAMETHASONE SODIUM PHOSPHATE 10 MG/ML IJ SOLN: 10.0000 mg | Freq: Once | INTRAMUSCULAR | Status: DC

## 2020-10-12 MED ORDER — AZITHROMYCIN 250 MG PO TABS: ORAL_TABLET | ORAL | 1 refills | Status: DC

## 2020-10-12 MED ORDER — DEXAMETHASONE 4 MG PO TABS: ORAL_TABLET | ORAL | 1 refills | Status: DC

## 2020-10-12 NOTE — Patient Instructions (Addendum)
   Continue taking the Mucinex DM every 12hours while having symptoms.  We sent in promethazine cough syrup you can take at night.   Take Cetirizine (Zyrtec) every night.  This will help dry up the fluid draining.  We will send in Dexamethasone steroid taper.  Take as directed with food.  If not improvement after 2-3 days take Azithromycin, antibiotic.   Increase the amount of fluids you drink at least 4 bottles of water a day OR more.

## 2020-10-12 NOTE — Progress Notes (Signed)
Assessment and Plan:  Tracy Paul was seen today for cough and sore throat.  Diagnoses and all orders for this visit:  Encounter for screening for COVID-19 -     POC COVID-19 BinaxNow-Negative  Acute non-recurrent pansinusitis -     azithromycin (ZITHROMAX) 250 MG tablet; Take 2 tablets (500 mg) on  Day 1,  followed by 1 tablet (250 mg) once daily on Days 2 through 5. -     dexamethasone (DECADRON) 4 MG tablet; Take 1 tab 3 x day - 3 days, then 2 x day - 3 days, then 1 tab daily  Bronchial irritation -     dexamethasone (DECADRON) injection 10 mg in office today  Left ear pain -     Ear Lavage Tolerated well, canal clear.  Cough -     promethazine-dextromethorphan (PROMETHAZINE-DM) 6.25-15 MG/5ML syrup; Take 5 mLs by mouth 4 (four) times daily as needed for cough. Mucinex DM       Further disposition pending results of labs. Discussed med's effects and SE's.   Over 30 minutes of exam, counseling, chart review, and critical decision making was performed.   Future Appointments  Date Time Provider Christine  10/12/2020  3:30 PM Garnet Sierras, NP [facility] None  12/06/2020  2:00 PM [name], MD [facility] None    ------------------------------------------------------------------------------------------------------------------   HPI 69 y.o.female presents for evaluation for cough, sore throat and runny nose. COVID19 antigen test negative today.  These symptoms start 3-4 days ago.  She is taking mucinexDM and flonase daily and some tylenol for the headache.  She reports an increase in coughing when she lays down at night.  Cough is productive, thick green yellow phlegm.  She is having sinus pressure above her eyes and around nose.  Reports her sore throat is steady.  Denies any shortness of breath or wheezing, chest pain. She reports she wears ear plugs at night because her husband snores. Left ear oltagia and cerumen impaction noted on assessment.  Ear lavage  preformed in office.  Past Medical History:  Diagnosis Date  . Allergic rhinitis   . Arthritis   . Cholecystitis, chronic   . Dysrhythmia    Hx bradycardia - hx normal cardiac cath x2 / ECHO 2009 EF 60% LAE MYOVIEW 3/12 EF 60% NO ISCHEMIA  . Gastrointestinal ulcer due to Helicobacter pylori 61/60/7371  . Hyperlipidemia   . Hypertension    no treatment after 120 lb weight loss  . Hypothyroidism   . Prediabetes   . Thyroid nodule      Allergies  Allergen Reactions  . Codeine Other (See Comments)    "puts me out of it".  Dysphoria.  Patient tolerates Tramadol without complications.  . Flagyl [Metronidazole] Hives  . Nabumetone     GI upset    Current Outpatient Medications on File Prior to Visit  Medication Sig  . Ascorbic Acid (VITAMIN C) 500 MG CAPS Take 1 tablet by mouth daily.   Marland Kitchen aspirin 81 MG tablet Take 81 mg by mouth daily.  Marland Kitchen atorvastatin (LIPITOR) 20 MG tablet Take     1 tablet     Daily       for Cholesterol  . Calcium Citrate-Vitamin D (CALCIUM CITRATE + D3 PO) Take 600 mg by mouth daily.   . cyclobenzaprine (FLEXERIL) 10 MG tablet Take 1/2 to 1 tablet 3 x /day as needed for Muscle Spasm  . diphenhydramine-acetaminophen (TYLENOL PM) 25-500 MG TABS tablet Take 1 tablet by mouth at bedtime.   Marland Kitchen  esomeprazole (NEXIUM) 40 MG capsule Take 1 capsule Daily for Heartburn & Indigestion  . Ferrous Sulfate (SLOW RELEASE IRON PO) Take 1 tablet by mouth daily.   . fish oil-omega-3 fatty acids 1000 MG capsule Take 1 g by mouth daily.  . fluticasone (FLONASE) 50 MCG/ACT nasal spray Place 2 sprays into both nostrils daily as needed for allergies.  Marland Kitchen gabapentin (NEURONTIN) 100 MG capsule Take 1 capsule 4 x /day for Neuropathy pain  . levothyroxine (EUTHYROX) 50 MCG tablet Take 1 tablet daily on an empty stomach with only water for 30 minutes & no Antacid meds, Calcium or Magnesium for 4 hours & avoid Biotin  . Magnesium 250 MG TABS Take 1 tablet by mouth daily.   . Melatonin 10 MG  TABS Take 1 tablet by mouth at bedtime.  . meloxicam (MOBIC) 15 MG tablet Take 1 tablet (15 mg total) by mouth daily as needed for pain.  . nitroGLYCERIN (NITROSTAT) 0.4 MG SL tablet Place 1 tablet (0.4 mg total) under the tongue every 5 (five) minutes as needed for chest pain.  Marland Kitchen olmesartan (BENICAR) 20 MG tablet Take 1 tablet Daily for BP  . potassium chloride (K-DUR) 10 MEQ tablet Take 1 tablet 2 x /day  . rOPINIRole (REQUIP) 3 MG tablet Take      1 tablet       at Bedtime       for Restless legs  . sucralfate (CARAFATE) 1 g tablet Take 1 tablet (1 g total) by mouth 4 (four) times daily. 30 min prior to meals and bedtime. Can dissolve in a few oz of water to help coat esophagus if needed.  . vitamin B-12 (CYANOCOBALAMIN) 1000 MCG tablet Take 1,000 mcg by mouth daily.   . Vitamin D, Ergocalciferol, (DRISDOL) 1.25 MG (50000 UNIT) CAPS capsule Take 1 capsule twice weekly for Severe Vitamin D Deficiency  . [DISCONTINUED] olmesartan-hydrochlorothiazide (BENICAR HCT) 20-12.5 MG tablet Take 1 tablet Daily for BP   No current facility-administered medications on file prior to visit.    ROS: all negative except above.   Physical Exam:  LMP  (LMP Unknown)   General Appearance: Well nourished, in no apparent distress. Eyes: PERRLA, EOMs, conjunctiva no swelling or erythema Sinuses: Frontal/maxillary tenderness ENT/Mouth: Ext aud canals clear on right, Left cerumen impaction, oltagia TMs without erythema, bulging on Right. No erythema, swelling, or exudate on post pharynx.  Tonsils not swollen or erythematous. Hearing normal.  Neck: Supple, thyroid normal.  Respiratory: Respiratory effort normal, BS equal bilaterally without rales, rhonchi noted, No wheezing or stridor.  Cardio: RRR with no MRGs. Brisk peripheral pulses without edema.  Abdomen: Soft, + BS.  Non tender, no guarding, rebound, hernias, masses. Lymphatics: Non tender without lymphadenopathy.  Musculoskeletal: Full ROM, 5/5 strength,  normal gait.  Skin: Warm, dry without rashes, lesions, ecchymosis.  Neuro: Cranial nerves intact. Normal muscle tone, no cerebellar symptoms. Sensation intact.  Psych: Awake and oriented X 3, normal affect, Insight and Judgment appropriate.     Garnet Sierras, NP 1:46 PM [facility]

## 2020-11-02 ENCOUNTER — Other Ambulatory Visit: Payer: Self-pay | Admitting: Adult Health Nurse Practitioner

## 2020-11-02 DIAGNOSIS — J0140 Acute pansinusitis, unspecified: Secondary | ICD-10-CM

## 2020-11-02 MED ORDER — AZITHROMYCIN 250 MG PO TABS: ORAL_TABLET | ORAL | 1 refills | Status: AC

## 2020-11-07 ENCOUNTER — Encounter: Payer: Federal, State, Local not specified - PPO | Admitting: Internal Medicine

## 2020-12-06 ENCOUNTER — Ambulatory Visit (INDEPENDENT_AMBULATORY_CARE_PROVIDER_SITE_OTHER): Payer: Federal, State, Local not specified - PPO | Admitting: Internal Medicine

## 2020-12-06 ENCOUNTER — Other Ambulatory Visit: Payer: Self-pay

## 2020-12-06 ENCOUNTER — Encounter: Payer: Self-pay | Admitting: Internal Medicine

## 2020-12-06 VITALS — BP 140/82 | HR 49 | Temp 97.0°F | Resp 16 | Ht 62.75 in | Wt 166.6 lb

## 2020-12-06 DIAGNOSIS — Z8249 Family history of ischemic heart disease and other diseases of the circulatory system: Secondary | ICD-10-CM | POA: Diagnosis not present

## 2020-12-06 DIAGNOSIS — K219 Gastro-esophageal reflux disease without esophagitis: Secondary | ICD-10-CM

## 2020-12-06 DIAGNOSIS — Z136 Encounter for screening for cardiovascular disorders: Secondary | ICD-10-CM

## 2020-12-06 DIAGNOSIS — E559 Vitamin D deficiency, unspecified: Secondary | ICD-10-CM

## 2020-12-06 DIAGNOSIS — Z13220 Encounter for screening for lipoid disorders: Secondary | ICD-10-CM

## 2020-12-06 DIAGNOSIS — Z0000 Encounter for general adult medical examination without abnormal findings: Secondary | ICD-10-CM

## 2020-12-06 DIAGNOSIS — I700 Atherosclerosis of aorta: Secondary | ICD-10-CM

## 2020-12-06 DIAGNOSIS — Z1211 Encounter for screening for malignant neoplasm of colon: Secondary | ICD-10-CM

## 2020-12-06 DIAGNOSIS — I10 Essential (primary) hypertension: Secondary | ICD-10-CM | POA: Diagnosis not present

## 2020-12-06 DIAGNOSIS — Z1212 Encounter for screening for malignant neoplasm of rectum: Secondary | ICD-10-CM

## 2020-12-06 DIAGNOSIS — E1169 Type 2 diabetes mellitus with other specified complication: Secondary | ICD-10-CM

## 2020-12-06 DIAGNOSIS — E039 Hypothyroidism, unspecified: Secondary | ICD-10-CM

## 2020-12-06 DIAGNOSIS — F17200 Nicotine dependence, unspecified, uncomplicated: Secondary | ICD-10-CM

## 2020-12-06 DIAGNOSIS — Z1389 Encounter for screening for other disorder: Secondary | ICD-10-CM | POA: Diagnosis not present

## 2020-12-06 DIAGNOSIS — Z0001 Encounter for general adult medical examination with abnormal findings: Secondary | ICD-10-CM

## 2020-12-06 DIAGNOSIS — Z131 Encounter for screening for diabetes mellitus: Secondary | ICD-10-CM | POA: Diagnosis not present

## 2020-12-06 DIAGNOSIS — Z1329 Encounter for screening for other suspected endocrine disorder: Secondary | ICD-10-CM

## 2020-12-06 DIAGNOSIS — I209 Angina pectoris, unspecified: Secondary | ICD-10-CM | POA: Insufficient documentation

## 2020-12-06 DIAGNOSIS — E1122 Type 2 diabetes mellitus with diabetic chronic kidney disease: Secondary | ICD-10-CM

## 2020-12-06 DIAGNOSIS — Z79899 Other long term (current) drug therapy: Secondary | ICD-10-CM

## 2020-12-06 DIAGNOSIS — E785 Hyperlipidemia, unspecified: Secondary | ICD-10-CM

## 2020-12-06 NOTE — Patient Instructions (Signed)

## 2020-12-06 NOTE — Progress Notes (Signed)
Annual Screening/Preventative Visit & Comprehensive Evaluation &  Examination      This very nice 70 y.o. MWF presents for a Screening /Preventative Visit & comprehensive evaluation and management of multiple medical co-morbidities.  Patient has been followed for HTN, HLD, T2_NIDDM  and Vitamin D Deficiency.       HTN predates since 1995.   Patient has had Neg heart caths x 2 in 1995 & 2000. In Oct 2019 she was hospitalized and MI was ruled out. Cardiac CTA excluded high risk CAD.   Patient's BP has been controlled at home and patient denies any cardiac symptoms as chest pain, palpitations, shortness of breath, dizziness or ankle swelling. Today's BP is  140/82 is at goal.       Patient's hyperlipidemia is controlled with diet and medications. Patient denies myalgias or other medication SE's. 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   Now she has lost about 15# over the last year. She has remained of Diabetic meds since. patient denies reactive hypoglycemic symptoms, visual blurring, diabetic polys or paresthesias. Last A1c was  Lab Results  Component Value Date   HGBA1C 5.5 05/10/2020   \    Finally, patient has history of Vitamin D Deficiency and last Vitamin D was  Lab Results  Component Value Date   VD25OH 135 (H) 05/10/2020    Current Outpatient Medications on File Prior to Visit  Medication Sig  . Ascorbic Acid (VITAMIN C) 500 MG CAPS Take 1 tablet by mouth daily.   Marland Kitchen aspirin 81 MG tablet Take 81 mg by mouth daily.  Marland Kitchen atorvastatin (LIPITOR) 20 MG tablet Take     1 tablet     Daily       for Cholesterol  . Calcium Citrate-Vitamin D (CALCIUM CITRATE + D3 PO) Take 600 mg by mouth daily.   .  diphenhydramine-acetaminophen (TYLENOL PM) 25-500 MG TABS tablet Take 1 tablet by mouth at bedtime.   Marland Kitchen esomeprazole (NEXIUM) 40 MG capsule Take 1 capsule Daily for Heartburn & Indigestion  . Ferrous Sulfate (SLOW RELEASE IRON PO) Take 1 tablet by mouth daily.   . fish oil-omega-3 fatty acids 1000 MG capsule Take 1 g by mouth daily.  . fluticasone (FLONASE) 50 MCG/ACT nasal spray Place 2 sprays into both nostrils daily as needed for allergies.  Marland Kitchen gabapentin (NEURONTIN) 100 MG capsule Take 1 capsule 4 x /day for Neuropathy pain  . levothyroxine (EUTHYROX) 50 MCG tablet Take 1 tablet daily on an empty stomach with only water for 30 minutes & no Antacid meds, Calcium or Magnesium for 4 hours & avoid Biotin  . Magnesium 250 MG TABS Take 1 tablet by mouth daily.   . Melatonin 10 MG TABS Take 1 tablet by mouth at bedtime.  . meloxicam (MOBIC) 15 MG tablet Take 1 tablet (15 mg total) by mouth daily as needed for pain.  . nitroGLYCERIN (NITROSTAT) 0.4 MG SL tablet Place 1 tablet (0.4 mg total) under the tongue every 5 (five) minutes as needed for chest pain.  Marland Kitchen olmesartan (BENICAR) 20 MG tablet Take 1 tablet Daily for BP  . potassium chloride (K-DUR) 10  MEQ tablet Take 1 tablet 2 x /day  . rOPINIRole (REQUIP) 3 MG tablet Take      1 tablet       at Bedtime       for Restless legs  . sucralfate (CARAFATE) 1 g tablet Take 1 tablet (1 g total) by mouth 4 (four) times daily. 30 min prior to meals and bedtime. Can dissolve in a few oz of water to help coat esophagus if needed.  . vitamin B-12 (CYANOCOBALAMIN) 1000 MCG tablet Take 1,000 mcg by mouth daily.   . Vitamin D, Ergocalciferol, (DRISDOL) 1.25 MG (50000 UNIT) CAPS capsule Take 1 capsule twice weekly for Severe Vitamin D Deficiency  . [DISCONTINUED] olmesartan-hydrochlorothiazide (BENICAR HCT) 20-12.5 MG tablet Take 1 tablet Daily for BP     Allergies  Allergen Reactions  . Codeine Other (See Comments)    "puts me out of it".  Dysphoria.  Patient  tolerates Tramadol without complications.  . Flagyl [Metronidazole] Hives  . Nabumetone GI upset        Past Medical History:  Diagnosis Date  . Allergic rhinitis   . Arthritis   . Cholecystitis, chronic   . Dysrhythmia    Hx bradycardia - hx normal cardiac cath x2 / ECHO 2009 EF 60% LAE MYOVIEW 3/12 EF 60% NO ISCHEMIA  . Gastrointestinal ulcer due to Helicobacter pylori 09/98/3382  . Hyperlipidemia   . Hypertension    no treatment after 120 lb weight loss  . Hypothyroidism   . Prediabetes   . Thyroid nodule    Health Maintenance  Topic Date Due  . MAMMOGRAM  07/25/2014  . COVID-19 Vaccine (3 - Booster for Moderna series) 07/28/2020  . COLONOSCOPY (Pts 45-51yrs Insurance coverage will need to be confirmed)  05/29/2023  . TETANUS/TDAP  05/23/2026  . INFLUENZA VACCINE  Completed  . DEXA SCAN  Completed  . Hepatitis C Screening  Completed  . PNA vac Low Risk Adult  Completed   Immunization History  Administered Date(s) Administered  . Influenza Split 08/25/2013, 09/02/2014, 08/10/2015  . Influenza, High Dose Seasonal PF 07/31/2017, 08/20/2018, 07/23/2019, 08/23/2020  . Influenza,inj,quad, With Preservative 08/10/2016  . Moderna Sars-Covid-2 Vaccination 12/30/2019, 01/27/2020  . PPD Test 02/23/2014, 05/23/2016  . Pneumococcal Conjugate-13 07/31/2018  . Pneumococcal Polysaccharide-23 10/18/2008, 10/08/2019  . Td 08/08/2006  . Tdap 05/23/2016  . Zoster 10/15/2012    Last Colon - 05/28/2018 - Dr Silverio Decamp - Recc f/u in 5 years - due Aug 2024  Last [facility] - Overdue & patient aware to schedule  Past Surgical History:  Procedure Laterality Date  . Bilateral carpal tunnel surgery    . BREATH TEK H PYLORI  07/01/2012   BREATH TEK H PYLORI;  Edward Jolly, MD  . CHOLECYSTECTOMY N/A 11/27/2013   LAPAROSCOPIC CHOLECYSTECTOMY WITH INTRAOPERATIVE CHOLANGIOGRAM;   Pedro Earls, MD;    . GASTRIC ROUX-EN-Y N/A 12/16/2012   LAP ROUX-EN-Y GASTRIC BYPASS WITH UPPER ENDOSCOPY;   Pedro Earls, MD  . NASAL SINUS SURGERY    . REPLACEMENT TOTAL KNEE BILATERAL     bilat  . TUBAL LIGATION     Family History  Problem Relation Age of Onset  . Breast cancer Mother   . Cancer Mother        breast cancer  . Anuerysm Mother        brain anuerysm  . Heart attack Father 50  . Heart attack Sister 22  . Colon cancer Neg Hx   . Stomach cancer Neg  Hx   . Rectal cancer Neg Hx   . Esophageal cancer Neg Hx    Social History   Tobacco Use  . Smoking status: Former Smoker    Packs/day: 0.80    Years: 10.00    Pack years: 8.00    Types: Cigarettes    Quit date: 10/30/1979    Years since quitting: 41.1  . Smokeless tobacco: Never Used  Vaping Use  . Vaping Use: Never used  Substance Use Topics  . Alcohol use: No  . Drug use: No    ROS Constitutional: Denies fever, chills, weight loss/gain, headaches, insomnia,  night sweats, and change in appetite. Does c/o fatigue. Eyes: Denies redness, blurred vision, diplopia, discharge, itchy, watery eyes.  ENT: Denies discharge, congestion, post nasal drip, epistaxis, sore throat, earache, hearing loss, dental pain, Tinnitus, Vertigo, Sinus pain, snoring.  Cardio: Denies chest pain, palpitations, irregular heartbeat, syncope, dyspnea, diaphoresis, orthopnea, PND, claudication, edema Respiratory: denies cough, dyspnea, DOE, pleurisy, hoarseness, laryngitis, wheezing.  Gastrointestinal: Denies dysphagia, heartburn, reflux, water brash, pain, cramps, nausea, vomiting, bloating, diarrhea, constipation, hematemesis, melena, hematochezia, jaundice, hemorrhoids Genitourinary: Denies dysuria, frequency, urgency, nocturia, hesitancy, discharge, hematuria, flank pain Breast: Breast lumps, nipple discharge, bleeding.  Musculoskeletal: Denies arthralgia, myalgia, stiffness, Jt. Swelling, pain, limp, and strain/sprain. Denies falls. Skin: Denies puritis, rash, hives, warts, acne, eczema, changing in skin lesion Neuro: No weakness, tremor,  incoordination, spasms, paresthesia, pain Psychiatric: Denies confusion, memory loss, sensory loss. Denies Depression. Endocrine: Denies change in weight, skin, hair change, nocturia, and paresthesia, diabetic polys, visual blurring, hyper / hypo glycemic episodes.  Heme/Lymph: No excessive bleeding, bruising, enlarged lymph nodes.  Physical Exam  BP 140/82   Pulse (!) 49   Temp (!) 97 F (36.1 C)   Resp 16   Ht 5' 2.75" (1.594 m)   Wt 166 lb 9.6 oz (75.6 kg)   LMP  (LMP Unknown)   SpO2 98%   BMI 29.75 kg/m   General Appearance: Well nourished, well groomed and in no apparent distress.  Eyes: PERRLA, EOMs, conjunctiva no swelling or erythema, normal fundi and vessels. Sinuses: No frontal/maxillary tenderness ENT/Mouth: EACs patent / TMs  nl. Nares clear without erythema, swelling, mucoid exudates. Oral hygiene is good. No erythema, swelling, or exudate. Tongue normal, non-obstructing. Tonsils not swollen or erythematous. Hearing normal.  Neck: Supple, thyroid not palpable. No bruits, nodes or JVD. Respiratory: Respiratory effort normal.  BS equal and clear bilateral without rales, rhonci, wheezing or stridor. Cardio: Heart sounds are normal with regular rate and rhythm and no murmurs, rubs or gallops. Peripheral pulses are normal and equal bilaterally without edema. No aortic or femoral bruits. Chest: symmetric with normal excursions and percussion. Breasts: Symmetric, without lumps, nipple discharge, retractions, or fibrocystic changes.  Abdomen: Flat, soft with bowel sounds active. Nontender, no guarding, rebound, hernias, masses, or organomegaly.  Lymphatics: Non tender without lymphadenopathy.  Genitourinary:  Musculoskeletal: Full ROM all peripheral extremities, joint stability, 5/5 strength, and normal gait. Skin: Warm and dry without rashes, lesions, cyanosis, clubbing or  ecchymosis.  Neuro: Cranial nerves intact, reflexes equal bilaterally. Normal muscle tone, no cerebellar  symptoms. Sensation intact.  Pysch: Alert and oriented X 3, normal affect, Insight and Judgment appropriate.   Assessment and Plan  1. Annual Preventative Screening Examination  2. Essential hypertension  - EKG 12-Lead - Urinalysis, Routine w reflex microscopic - Microalbumin / creatinine urine ratio - CBC with Differential/Platelet - COMPLETE METABOLIC PANEL WITH GFR - Magnesium - TSH - [place], RETROPERITNL ABD,  LTD  3. Hyperlipidemia associated with type 2 diabetes mellitus ([place])  - EKG 12-Lead - Lipid panel - TSH - [place], RETROPERITNL ABD,  LTD  4. Type 2 diabetes mellitus with stage 2 chronic kidney  disease, without long-term current use of insulin (HCC)  - EKG 12-Lead - HM DIABETES FOOT EXAM - LOW EXTREMITY NEUR EXAM DOCUM - Hemoglobin A1c - Insulin, random - [place], RETROPERITNL ABD,  LTD  5. Vitamin D deficiency  - VITAMIN D 25 Hydroxy   6. Hypothyroidism  - TSH  7. Gastroesophageal reflux disease  - CBC with Differential/Platelet  8. Aortic atherosclerosis ([place]) by Abd CT scan on 08/01/2017  - EKG 12-Lead  9. Morbid obesity ([place])   10. Screening for ischemic heart disease  - EKG 12-Lead  11. Screening for AAA (aortic abdominal aneurysm)  - [place], RETROPERITNL ABD,  LTD  12. Smoker  - EKG 12-Lead - [place], RETROPERITNL ABD,  LTD  13. FHx: heart disease  - EKG 12-Lead - [place], RETROPERITNL ABD,  LTD  14. Screening for colorectal cancer  - POC Hemoccult Bld/Stl 15. Medication management  - Urinalysis, Routine w reflex microscopic - Microalbumin / creatinine urine ratio - CBC with Differential/Platelet - COMPLETE METABOLIC PANEL WITH GFR - Magnesium - Lipid panel - TSH - Hemoglobin A1c - Insulin, random - VITAMIN D 25 Hydroxy          Patient was counseled in prudent diet to achieve/maintain BMI less than 25 for weight control, BP monitoring, regular exercise and medications. Discussed med's effects and SE's. Screening labs and tests as  requested with regular follow-up as recommended. Over 40 minutes of exam, counseling, chart review and high complex critical decision making was performed.   Kirtland Bouchard, MD

## 2020-12-07 LAB — URINALYSIS, ROUTINE W REFLEX MICROSCOPIC
Bacteria, UA: NONE SEEN /HPF
Bilirubin Urine: NEGATIVE
Glucose, UA: NEGATIVE
Hgb urine dipstick: NEGATIVE
Hyaline Cast: NONE SEEN /LPF
Ketones, ur: NEGATIVE
Leukocytes,Ua: NEGATIVE
Nitrite: NEGATIVE
Protein, ur: NEGATIVE
RBC / HPF: NONE SEEN /HPF (ref 0–2)
Specific Gravity, Urine: 1.007 (ref 1.001–1.03)
Squamous Epithelial / HPF: NONE SEEN /HPF (ref <=5)
WBC, UA: NONE SEEN /HPF (ref 0–5)
pH: 5.5 (ref 5.0–8.0)

## 2020-12-07 LAB — LIPID PANEL
Cholesterol: 102 mg/dL (ref <200)
HDL: 53 mg/dL (ref >=50)
LDL Cholesterol (Calc): 36 mg/dL (calc)
Non-HDL Cholesterol (Calc): 49 mg/dL (calc) (ref <130)
Total CHOL/HDL Ratio: 1.9 (calc) (ref <5.0)
Triglycerides: 44 mg/dL (ref <150)

## 2020-12-07 LAB — COMPLETE METABOLIC PANEL WITH GFR
AG Ratio: 1.6 (calc) (ref 1.0–2.5)
ALT: 25 U/L (ref 6–29)
AST: 31 U/L (ref 10–35)
Albumin: 3.7 g/dL (ref 3.6–5.1)
Alkaline phosphatase (APISO): 104 U/L (ref 37–153)
BUN: 11 mg/dL (ref 7–25)
CO2: 28 mmol/L (ref 20–32)
Calcium: 9 mg/dL (ref 8.6–10.4)
Chloride: 107 mmol/L (ref 98–110)
Creat: 0.89 mg/dL (ref 0.50–0.99)
GFR, Est African American: 77 mL/min/{1.73_m2} (ref >=60)
GFR, Est Non African American: 67 mL/min/{1.73_m2} (ref >=60)
Globulin: 2.3 g/dL (calc) (ref 1.9–3.7)
Glucose, Bld: 83 mg/dL (ref 65–99)
Potassium: 4 mmol/L (ref 3.5–5.3)
Sodium: 143 mmol/L (ref 135–146)
Total Bilirubin: 1.6 mg/dL — ABNORMAL HIGH (ref 0.2–1.2)
Total Protein: 6 g/dL — ABNORMAL LOW (ref 6.1–8.1)

## 2020-12-07 LAB — CBC WITH DIFFERENTIAL/PLATELET
Absolute Monocytes: 409 cells/uL (ref 200–950)
Basophils Absolute: 78 cells/uL (ref 0–200)
Basophils Relative: 1.4 %
Eosinophils Absolute: 420 cells/uL (ref 15–500)
Eosinophils Relative: 7.5 %
HCT: 34.3 % — ABNORMAL LOW (ref 35.0–45.0)
Hemoglobin: 11.4 g/dL — ABNORMAL LOW (ref 11.7–15.5)
Lymphs Abs: 2346 cells/uL (ref 850–3900)
MCH: 31.1 pg (ref 27.0–33.0)
MCHC: 33.2 g/dL (ref 32.0–36.0)
MCV: 93.7 fL (ref 80.0–100.0)
MPV: 11.3 fL (ref 7.5–12.5)
Monocytes Relative: 7.3 %
Neutro Abs: 2346 cells/uL (ref 1500–7800)
Neutrophils Relative %: 41.9 %
Platelets: 254 10*3/uL (ref 140–400)
RBC: 3.66 10*6/uL — ABNORMAL LOW (ref 3.80–5.10)
RDW: 12.2 % (ref 11.0–15.0)
Total Lymphocyte: 41.9 %
WBC: 5.6 10*3/uL (ref 3.8–10.8)

## 2020-12-07 LAB — MICROALBUMIN / CREATININE URINE RATIO
Creatinine, Urine: 27 mg/dL (ref 20–275)
Microalb, Ur: <0.2 mg/dL

## 2020-12-07 LAB — HEMOGLOBIN A1C
Hgb A1c MFr Bld: 5.6 % of total Hgb (ref <5.7)
Mean Plasma Glucose: 114 mg/dL
eAG (mmol/L): 6.3 mmol/L

## 2020-12-07 LAB — TSH: TSH: 1.26 mIU/L (ref 0.40–4.50)

## 2020-12-07 LAB — INSULIN, RANDOM: Insulin: 2.4 u[IU]/mL

## 2020-12-07 LAB — VITAMIN D 25 HYDROXY (VIT D DEFICIENCY, FRACTURES): Vit D, 25-Hydroxy: 96 ng/mL (ref 30–100)

## 2020-12-07 LAB — MAGNESIUM: Magnesium: 2 mg/dL (ref 1.5–2.5)

## 2020-12-07 NOTE — Progress Notes (Signed)
========================================================== -   Test results slightly outside the reference range are not unusual. If there is anything important, I will review this with you,  otherwise it is considered normal test values.  If you have further questions,  please do not hesitate to contact me at the office or via My Chart.  ========================================================== ==========================================================  -  CBC shows a mild anemia which is Stable, But . . . Marland Kitchen           uggest that you increase your Iron supplement to 2 tablets /day and                                                                also take a B-Complex supplement  ========================================================== ==========================================================  -  Total Chol = 102    and      LDL Chol = 36   - Wonderful - Both  Excellent   - Very low risk for Heart Attack  / Stroke ========================================================== ==========================================================  -  A1c - Normal - Great - no Diabetes  ! ========================================================== ==========================================================  -  Vitamin D = 96 -  Excellent   !  ========================================================== ==========================================================  -  Hard Work, But you're doing Excellent  ! ========================================================== ==========================================================

## 2021-01-02 ENCOUNTER — Telehealth: Payer: Self-pay

## 2021-01-02 NOTE — Telephone Encounter (Signed)
Primary Cardiologist:James Hochrein, MD  Chart reviewed as part of pre-operative protocol coverage. Because of Tracy Paul's past medical history and time since last visit, he/she will require a follow-up visit in order to better assess preoperative cardiovascular risk.  Pre-op covering staff: - Please schedule appointment and call patient to inform them. - Please contact requesting surgeon's office via preferred method (i.e, phone, fax) to inform them of need for appointment prior to surgery.  If applicable, this message will also be routed to pharmacy pool and/or primary cardiologist for input on holding anticoagulant/antiplatelet agent as requested below so that this information is available at time of patient's appointment.   Deberah Pelton, NP  01/02/2021, 3:07 PM

## 2021-01-02 NOTE — Telephone Encounter (Signed)
   [facility] Pre-operative Risk Assessment    HEARTCARE STAFF: - Please ensure there is not already an duplicate clearance open for this procedure. - Under Visit Info/Reason for Call, type in Other and utilize the format Clearance MM/DD/YY or Clearance TBD. Do not use dashes or single digits. - If request is for dental extraction, please clarify the # of teeth to be extracted.  Request for surgical clearance:  1. What type of surgery is being performed? RIGHT THUMB SUSPENSIONPLASTY   2. When is this surgery scheduled? 01-19-2021   3. What type of clearance is required (medical clearance vs. Pharmacy clearance to hold med vs. Both)? MEDICAL  4. Are there any medications that need to be held prior to surgery and how long? NONE   5. Practice name and name of physician performing surgery? [facility] ATTN:REBECCA   6. What is the office phone number? [phone]   7.   What is the office fax number? [phone]  8.   Anesthesia type (None, local, MAC, general) ? CHOICE

## 2021-01-02 NOTE — Telephone Encounter (Signed)
Appt scheduled w/JH fri 3-11

## 2021-01-04 ENCOUNTER — Other Ambulatory Visit: Payer: Self-pay | Admitting: Adult Health

## 2021-01-04 ENCOUNTER — Other Ambulatory Visit: Payer: Self-pay | Admitting: Internal Medicine

## 2021-01-04 DIAGNOSIS — E785 Hyperlipidemia, unspecified: Secondary | ICD-10-CM

## 2021-01-04 MED ORDER — ATORVASTATIN CALCIUM 20 MG PO TABS: ORAL_TABLET | ORAL | 1 refills | Status: DC

## 2021-01-04 MED ORDER — ATORVASTATIN CALCIUM 20 MG PO TABS
ORAL_TABLET | ORAL | 1 refills | Status: DC
Start: 2021-01-04 — End: 2021-01-04

## 2021-01-04 NOTE — Progress Notes (Signed)
Cardiology Office Note   Date:  01/06/2021   ID:  Tracy Paul, DOB Jun 12, 1951, MRN [id number]  PCP:  [name], MD  Cardiologist:   Minus Breeding, MD   Chief Complaint  Patient presents with  . Coronary Artery Disease      History of Present Illness: Tracy Paul is a 70 y.o. female who presents for evaluation of chest pain.  She was in the hospital recently with chest pain.  She had a cardiac CTA with minimal coronary disease and distal vessel disease.  I saw her last in 2019.  She is to have surgery.  Because she had nonobstructive coronary disease it was requested that we see her preoperatively.  She has done very well since we last saw her.  She had none of the chest discomfort that prompted her previous evaluation.  She takes care of 2 grandchildren for a few hours and she plays ball with possible physical activity.  She denies any cardiovascular symptoms.  The patient denies any new symptoms such as chest discomfort, neck or arm discomfort. There has been no new shortness of breath, PND or orthopnea. There have been no reported palpitations, presyncope or syncope.    Past Medical History:  Diagnosis Date  . Allergic rhinitis   . Arthritis   . Cholecystitis, chronic   . Dysrhythmia    Hx bradycardia - hx normal cardiac cath x2 / ECHO 2009 EF 60% LAE MYOVIEW 3/12 EF 60% NO ISCHEMIA  . Gastrointestinal ulcer due to Helicobacter pylori 53/61/4431  . Hyperlipidemia   . Hypertension    no treatment after 120 lb weight loss  . Hypothyroidism   . Prediabetes   . Thyroid nodule     Past Surgical History:  Procedure Laterality Date  . Bilateral carpal tunnel surgery    . BREATH TEK H PYLORI  07/01/2012   Procedure: BREATH TEK H PYLORI;  Surgeon: Edward Jolly, MD;  Location: [facility];  Service: General;  Laterality: N/A;  . CHOLECYSTECTOMY N/A 11/27/2013   Procedure: LAPAROSCOPIC CHOLECYSTECTOMY WITH INTRAOPERATIVE CHOLANGIOGRAM;  Surgeon: Pedro Earls, MD;  Location: [facility];  Service: General;  Laterality: N/A;  . GASTRIC ROUX-EN-Y N/A 12/16/2012   Procedure: LAPAROSCOPIC ROUX-EN-Y GASTRIC BYPASS WITH UPPER ENDOSCOPY;  Surgeon: Pedro Earls, MD;  Location: [facility];  Service: General;  Laterality: N/A;  Gastric Bypass  . NASAL SINUS SURGERY    . REPLACEMENT TOTAL KNEE BILATERAL     bilat  . TUBAL LIGATION       Current Outpatient Medications  Medication Sig Dispense Refill  . Ascorbic Acid (VITAMIN C) 500 MG CAPS Take 1 tablet by mouth daily.     Marland Kitchen aspirin 81 MG tablet Take 81 mg by mouth daily.    Marland Kitchen atorvastatin (LIPITOR) 20 MG tablet Take 1 tablet Daily for Cholesterol 90 tablet 1  . Calcium Citrate-Vitamin D (CALCIUM CITRATE + D3 PO) Take 600 mg by mouth daily.     . diphenhydramine-acetaminophen (TYLENOL PM) 25-500 MG TABS tablet Take 1 tablet by mouth at bedtime.     Marland Kitchen esomeprazole (NEXIUM) 40 MG capsule Take 1 capsule Daily for Heartburn & Indigestion 90 capsule 1  . Ferrous Sulfate (SLOW RELEASE IRON PO) Take 1 tablet by mouth daily.     . fish oil-omega-3 fatty acids 1000 MG capsule Take 1 g by mouth daily.    . fluticasone (FLONASE) 50 MCG/ACT nasal spray Place 2 sprays into both nostrils daily as needed  for allergies.    Marland Kitchen gabapentin (NEURONTIN) 100 MG capsule Take 1 capsule 4 x /day for Neuropathy pain 360 capsule 3  . levothyroxine (EUTHYROX) 50 MCG tablet Take 1 tablet daily on an empty stomach with only water for 30 minutes & no Antacid meds, Calcium or Magnesium for 4 hours & avoid Biotin 90 tablet 0  . Magnesium 250 MG TABS Take 1 tablet by mouth daily.     . Melatonin 10 MG TABS Take 1 tablet by mouth at bedtime.    . meloxicam (MOBIC) 15 MG tablet Take 1 tablet (15 mg total) by mouth daily as needed for pain.    . nitroGLYCERIN (NITROSTAT) 0.4 MG SL tablet Place 1 tablet (0.4 mg total) under the tongue every 5 (five) minutes as needed for chest pain. 30 tablet 0  . olmesartan (BENICAR) 20 MG tablet Take 1  tablet Daily for BP 90 tablet 1  . potassium chloride (K-DUR) 10 MEQ tablet Take 1 tablet 2 x /day 180 tablet 1  . rOPINIRole (REQUIP) 3 MG tablet Take      1 tablet       at Bedtime       for Restless legs 90 tablet 0  . sucralfate (CARAFATE) 1 g tablet Take 1 tablet (1 g total) by mouth 4 (four) times daily. 30 min prior to meals and bedtime. Can dissolve in a few oz of water to help coat esophagus if needed. 120 tablet 0  . vitamin B-12 (CYANOCOBALAMIN) 1000 MCG tablet Take 1,000 mcg by mouth daily.     . Vitamin D, Ergocalciferol, (DRISDOL) 1.25 MG (50000 UNIT) CAPS capsule Take 1 capsule twice weekly for Severe Vitamin D Deficiency 24 capsule 3   No current facility-administered medications for this visit.    Allergies:   Codeine, Flagyl [metronidazole], and Nabumetone    ROS:  Please see the history of present illness.   Otherwise, review of systems are positive for none.   All other systems are reviewed and negative.    PHYSICAL EXAM: VS:  BP 130/74   Pulse (!) 58   Resp (!) 94   Ht 5' 4.5" (1.638 m)   Wt 165 lb 12.8 oz (75.2 kg)   LMP  (LMP Unknown)   BMI 28.02 kg/m  , BMI Body mass index is 28.02 kg/m. GENERAL:  Well appearing NECK:  No jugular venous distention, waveform within normal limits, carotid upstroke brisk and symmetric, no bruits, no thyromegaly LUNGS:  Clear to auscultation bilaterally CHEST:  Unremarkable HEART:  PMI not displaced or sustained,S1 and S2 within normal limits, no S3, no S4, no clicks, no rubs, no murmurs ABD:  Flat, positive bowel sounds normal in frequency in pitch, no bruits, no rebound, no guarding, no midline pulsatile mass, no hepatomegaly, no splenomegaly EXT:  2 plus pulses throughout, no edema, no cyanosis no clubbing   EKG:  EKG is not ordered today. 05/30/2021 normal sinus rhythm, premature ventricular contractions in a trigeminal pattern, no acute ST-T wave changes.  Recent Labs: 12/06/2020: ALT 25; BUN 11; Creat 0.89; Hemoglobin  11.4; Magnesium 2.0; Platelets 254; Potassium 4.0; Sodium 143; TSH 1.26    Lipid Panel    Component Value Date/Time   CHOL 102 12/06/2020 1421   TRIG 44 12/06/2020 1421   HDL 53 12/06/2020 1421   CHOLHDL 1.9 12/06/2020 1421   VLDL 7 08/21/2018 0519   LDLCALC 36 12/06/2020 1421      Wt Readings from Last 3 Encounters:  01/06/21  165 lb 12.8 oz (75.2 kg)  12/06/20 166 lb 9.6 oz (75.6 kg)  10/12/20 165 lb 6.4 oz (75 kg)      Other studies Reviewed: Additional studies/ records that were reviewed today include: EKG from PCP. Review of the above records demonstrates:  Please see elsewhere in the note.     ASSESSMENT AND PLAN:   CAD:    The patient has no new sypmtoms.  No further cardiovascular testing is indicated.  We will continue with aggressive risk reduction and meds as listed.  She has a high functional status and she is aggressive with risk reduction.  DYSLIPIDEMIA: LDL was 36 with an HDL of 53.  This was in February of this year.  No change in therapy.  PVCs: She has not feel these.  No change in therapy.  PREOP: The patient has a high functional status.  She is going for low risk procedure.  She has no high risk symptoms or physical findings.  No change in therapy.  No further testing.  She is in acceptable risk for the planned surgery based on ACC/AHA guidelines.   Current medicines are reviewed at length with the patient today.  The patient does not have concerns regarding medicines.  The following changes have been made:   None  Labs/ tests ordered today include: None No orders of the defined types were placed in this encounter.    Disposition:   FU with me as needed.       Signed, Minus Breeding, MD  01/06/2021 10:24 AM    [facility]

## 2021-01-06 ENCOUNTER — Ambulatory Visit: Payer: Federal, State, Local not specified - PPO | Admitting: Cardiology

## 2021-01-06 ENCOUNTER — Other Ambulatory Visit: Payer: Self-pay

## 2021-01-06 ENCOUNTER — Encounter: Payer: Self-pay | Admitting: Cardiology

## 2021-01-06 VITALS — BP 130/74 | HR 58 | Resp 94 | Ht 64.5 in | Wt 165.8 lb

## 2021-01-06 DIAGNOSIS — E785 Hyperlipidemia, unspecified: Secondary | ICD-10-CM | POA: Diagnosis not present

## 2021-01-06 DIAGNOSIS — I2510 Atherosclerotic heart disease of native coronary artery without angina pectoris: Secondary | ICD-10-CM | POA: Diagnosis not present

## 2021-01-06 DIAGNOSIS — R001 Bradycardia, unspecified: Secondary | ICD-10-CM | POA: Diagnosis not present

## 2021-01-06 NOTE — Patient Instructions (Signed)
Medication Instructions:  The current medical regimen is effective;  continue present plan and medications as directed. Please refer to the Current Medication list given to you today.  *If you need a refill on your cardiac medications before your next appointment, please call your pharmacy*  Lab Work:   Testing/Procedures:  NONE    NONE  Follow-Up: Your next appointment:  AS NEEDED  with James Hochrein, MD   At [facility], you and your health needs are our priority.  As part of our continuing mission to provide you with exceptional heart care, we have created designated Provider Care Teams.  These Care Teams include your primary Cardiologist (physician) and Advanced Practice Providers (APPs -  Physician Assistants and Nurse Practitioners) who all work together to provide you with the care you need, when you need it.  

## 2021-01-11 ENCOUNTER — Other Ambulatory Visit: Payer: Self-pay | Admitting: Internal Medicine

## 2021-01-17 ENCOUNTER — Telehealth: Payer: Self-pay | Admitting: *Deleted

## 2021-01-17 NOTE — Telephone Encounter (Signed)
Pre-op form and records faxed to Marlin Canary, surgery coordinator.

## 2021-02-09 ENCOUNTER — Ambulatory Visit: Payer: Federal, State, Local not specified - PPO | Admitting: Internal Medicine

## 2021-02-18 ENCOUNTER — Emergency Department (HOSPITAL_COMMUNITY): Payer: Federal, State, Local not specified - PPO

## 2021-02-18 ENCOUNTER — Encounter (HOSPITAL_COMMUNITY): Payer: Self-pay | Admitting: Emergency Medicine

## 2021-02-18 ENCOUNTER — Other Ambulatory Visit: Payer: Self-pay

## 2021-02-18 ENCOUNTER — Emergency Department (HOSPITAL_COMMUNITY)
Admission: EM | Admit: 2021-02-18 | Discharge: 2021-02-18 | Disposition: A | Discharge status: Home / Self Care | Payer: Federal, State, Local not specified - PPO | Attending: Emergency Medicine | Admitting: Emergency Medicine

## 2021-02-18 DIAGNOSIS — Z7982 Long term (current) use of aspirin: Secondary | ICD-10-CM | POA: Insufficient documentation

## 2021-02-18 DIAGNOSIS — I10 Essential (primary) hypertension: Secondary | ICD-10-CM | POA: Diagnosis not present

## 2021-02-18 DIAGNOSIS — Z79899 Other long term (current) drug therapy: Secondary | ICD-10-CM | POA: Insufficient documentation

## 2021-02-18 DIAGNOSIS — R079 Chest pain, unspecified: Secondary | ICD-10-CM | POA: Diagnosis present

## 2021-02-18 DIAGNOSIS — I2510 Atherosclerotic heart disease of native coronary artery without angina pectoris: Secondary | ICD-10-CM | POA: Insufficient documentation

## 2021-02-18 DIAGNOSIS — E039 Hypothyroidism, unspecified: Secondary | ICD-10-CM | POA: Diagnosis not present

## 2021-02-18 DIAGNOSIS — R0789 Other chest pain: Secondary | ICD-10-CM

## 2021-02-18 DIAGNOSIS — Z96653 Presence of artificial knee joint, bilateral: Secondary | ICD-10-CM | POA: Diagnosis not present

## 2021-02-18 DIAGNOSIS — M549 Dorsalgia, unspecified: Secondary | ICD-10-CM | POA: Diagnosis not present

## 2021-02-18 DIAGNOSIS — Z87891 Personal history of nicotine dependence: Secondary | ICD-10-CM | POA: Diagnosis not present

## 2021-02-18 LAB — CBC WITH DIFFERENTIAL/PLATELET
Abs Immature Granulocytes: 0.02 10*3/uL (ref 0.00–0.07)
Basophils Absolute: 0.1 10*3/uL (ref 0.0–0.1)
Basophils Relative: 1 %
Eosinophils Absolute: 0.2 10*3/uL (ref 0.0–0.5)
Eosinophils Relative: 3 %
HCT: 34.8 % — ABNORMAL LOW (ref 36.0–46.0)
Hemoglobin: 11.3 g/dL — ABNORMAL LOW (ref 12.0–15.0)
Immature Granulocytes: 0 %
Lymphocytes Relative: 29 %
Lymphs Abs: 2.3 10*3/uL (ref 0.7–4.0)
MCH: 31 pg (ref 26.0–34.0)
MCHC: 32.5 g/dL (ref 30.0–36.0)
MCV: 95.3 fL (ref 80.0–100.0)
Monocytes Absolute: 0.6 10*3/uL (ref 0.1–1.0)
Monocytes Relative: 7 %
Neutro Abs: 4.6 10*3/uL (ref 1.7–7.7)
Neutrophils Relative %: 60 %
Platelets: 226 10*3/uL (ref 150–400)
RBC: 3.65 MIL/uL — ABNORMAL LOW (ref 3.87–5.11)
RDW: 13.2 % (ref 11.5–15.5)
WBC: 7.8 10*3/uL (ref 4.0–10.5)
nRBC: 0 % (ref 0.0–0.2)

## 2021-02-18 LAB — BASIC METABOLIC PANEL
Anion gap: 7 (ref 5–15)
BUN: 14 mg/dL (ref 8–23)
CO2: 24 mmol/L (ref 22–32)
Calcium: 8.8 mg/dL — ABNORMAL LOW (ref 8.9–10.3)
Chloride: 109 mmol/L (ref 98–111)
Creatinine, Ser: 0.92 mg/dL (ref 0.44–1.00)
GFR, Estimated: >60 mL/min (ref >60)
Glucose, Bld: 100 mg/dL — ABNORMAL HIGH (ref 70–99)
Potassium: 4.1 mmol/L (ref 3.5–5.1)
Sodium: 140 mmol/L (ref 135–145)

## 2021-02-18 LAB — TROPONIN I (HIGH SENSITIVITY)
Troponin I (High Sensitivity): 5 ng/L (ref <18)
Troponin I (High Sensitivity): 5 ng/L (ref <18)

## 2021-02-18 NOTE — ED Triage Notes (Signed)
Pt was working outside and starting c/o of central cp. Took 2 nitro and 4 baby ASA with relief.

## 2021-02-18 NOTE — ED Provider Notes (Signed)
I provided a substantive portion of the care of this patient.  I personally performed the entirety of the history, exam and medical decision making for this encounter.  EKG Interpretation  Date/Time:  Saturday February 18 2021 14:57:23 EDT Ventricular Rate:  55 PR Interval:  181 QRS Duration: 94 QT Interval:  447 QTC Calculation: 428 R Axis:   15 Text Interpretation: Sinus rhythm Anterior infarct, old No significant change since last tracing Confirmed by Fredia Sorrow [phone]) on 02/18/2021 3:03:23 PM  Patient seen by me along with the physician assistant.  Patient onset of chest pain at about 1 PM.  Lasted for half an hour resolved with taking aspirin and 2 sublingual nitroglycerin.  The pain was substernal chest pain patient was out working in the yard but did not start then.  It started after she been back in the house for a while.  Past medical history sniffing for hypertension hyperlipidemia history of dysrhythmia.  Patient pain-free now.  Exam chest nontender heart regular rate and rhythm abdomen soft lungs clear  Patient's work-up will need to be to troponins.  If second troponin without any significant change and patient remains pain-free probably stable for discharge home and follow-up with cardiology    Fredia Sorrow, MD 02/18/21 [phone]

## 2021-02-18 NOTE — Discharge Instructions (Addendum)
Please contact your cardiologist, Dr. Percival Spanish on Monday to arrange a follow-up appointment.  Return to the emergency department if you develop any new or worsening symptoms.

## 2021-02-18 NOTE — ED Notes (Signed)
Resting quietly

## 2021-02-18 NOTE — ED Notes (Signed)
Lab Tech in to draw 2nd troponin.

## 2021-02-18 NOTE — ED Provider Notes (Signed)
[facility] EMERGENCY DEPARTMENT Provider Note   CSN: [id number] Arrival date & time: 02/18/21  1404     History Chief Complaint  Patient presents with  . Chest Pain    Tracy Paul is a 70 y.o. female.  HPI      Tracy Paul is a 70 y.o. female with past medical history of hypertension, prediabetes, hypothyroidism, cardiac angina, bradycardia who presents to the Emergency Department complaining of sudden onset of central chest pain that she described as sharp and nonradiating.  Pain began at 1:00 pm,  approximately 2 hours after pulling weeds in her garden.  She took 4 baby aspirin and 1 sublingual nitroglycerin without relief.  Took a second nitroglycerin 5 minutes after the first one and pain began to resolve. Lasted approximately 30 minutes. Endorses having some upper back pain during that episode that has since resolved.  She denies any shortness of breath, nausea, vomiting, jaw, neck or arm pain.      Past Medical History:  Diagnosis Date  . Allergic rhinitis   . Arthritis   . Cholecystitis, chronic   . Dysrhythmia    Hx bradycardia - hx normal cardiac cath x2 / ECHO 2009 EF 60% LAE MYOVIEW 3/12 EF 60% NO ISCHEMIA  . Gastrointestinal ulcer due to Helicobacter pylori 01/60/1093  . Hyperlipidemia   . Hypertension    no treatment after 120 lb weight loss  . Hypothyroidism   . Prediabetes   . Thyroid nodule     Patient Active Problem List   Diagnosis Date Noted  . Morbid obesity ([place]) 12/06/2020  . Cardiac angina ([place]) 12/06/2020  . History of colonic polyps 08/23/2020  . Coronary artery disease involving native coronary artery of native heart without angina pectoris 08/26/2018  . Iron deficiency anemia 08/21/2018  . Bradycardia 08/21/2018  . Overweight (BMI 25.0-29.9) 10/30/2017  . Aortic atherosclerosis ([place]) by Abd CT scan on 08/01/2017 10/10/2017  . Irritable bowel syndrome with diarrhea 10/16/2016  . Medication management 09/02/2014  . Vitamin D  deficiency 12/02/2013  . 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 Hyperlipidemia LDL goal <70   . Hypothyroidism   . GERD (gastroesophageal reflux disease)   . Essential hypertension 09/26/2010  . 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 HEADACHE, CHRONIC 09/26/2010    Past Surgical History:  Procedure Laterality Date  . Bilateral carpal tunnel surgery    . BREATH TEK H PYLORI  07/01/2012   Procedure: BREATH TEK H PYLORI;  Surgeon: Edward Jolly, MD;  Location: [facility];  Service: General;  Laterality: N/A;  . CHOLECYSTECTOMY N/A 11/27/2013   Procedure: LAPAROSCOPIC CHOLECYSTECTOMY WITH INTRAOPERATIVE CHOLANGIOGRAM;  Surgeon: Pedro Earls, MD;  Location: [facility];  Service: General;  Laterality: N/A;  . GASTRIC ROUX-EN-Y N/A 12/16/2012   Procedure: LAPAROSCOPIC ROUX-EN-Y GASTRIC BYPASS WITH UPPER ENDOSCOPY;  Surgeon: Pedro Earls, MD;  Location: [facility];  Service: General;  Laterality: N/A;  Gastric Bypass  . NASAL SINUS SURGERY    . REPLACEMENT TOTAL KNEE BILATERAL     bilat  . TUBAL LIGATION       OB History   No obstetric history on file.     Family History  Problem Relation Age of Onset  . Breast cancer Mother   . Cancer Mother        breast cancer  . Anuerysm Mother        brain anuerysm  . Heart attack Father 59  . Heart attack Sister 28  .  Colon cancer Neg Hx   . Stomach cancer Neg Hx   . Rectal cancer Neg Hx   . Esophageal cancer Neg Hx     Social History   Tobacco Use  . Smoking status: Former Smoker    Packs/day: 0.80    Years: 10.00    Pack years: 8.00    Types: Cigarettes    Quit date: 10/30/1979    Years since quitting: 41.3  . Smokeless tobacco: Never Used  Vaping Use  . Vaping Use: Never used  Substance Use Topics  . Alcohol use: No  . Drug use: No    Home Medications Prior to Admission medications   Medication Sig Start Date End Date Taking? Authorizing Provider  Ascorbic Acid (VITAMIN C) 500 MG CAPS Take 1 tablet by  mouth daily.     [provider]  aspirin 81 MG tablet Take 81 mg by mouth daily.    [provider]  atorvastatin (LIPITOR) 20 MG tablet Take 1 tablet Daily for Cholesterol 01/04/21   [name], NP  Calcium Citrate-Vitamin D (CALCIUM CITRATE + D3 PO) Take 600 mg by mouth daily.     [provider]  diphenhydramine-acetaminophen (TYLENOL PM) 25-500 MG TABS tablet Take 1 tablet by mouth at bedtime.     [provider]  esomeprazole (NEXIUM) 40 MG capsule Take 1 capsule Daily for Heartburn & Indigestion 05/10/20   [name], MD  Ferrous Sulfate (SLOW RELEASE IRON PO) Take 1 tablet by mouth daily.     [provider]  fish oil-omega-3 fatty acids 1000 MG capsule Take 1 g by mouth daily.    [provider]  fluticasone (FLONASE) 50 MCG/ACT nasal spray Place 2 sprays into both nostrils daily as needed for allergies. 08/22/18   Aline August, MD  gabapentin (NEURONTIN) 100 MG capsule Take 1 capsule 4 x /day for Neuropathy pain 11/25/19   [name], MD  levothyroxine (EUTHYROX) 50 MCG tablet Take 1 tablet daily on an empty stomach with only water for 30 minutes & no Antacid meds, Calcium or Magnesium for 4 hours & avoid Biotin 07/13/20   [name], MD  Magnesium 250 MG TABS Take 1 tablet by mouth daily.     [provider]  Melatonin 10 MG TABS Take 1 tablet by mouth at bedtime.    [provider]  meloxicam (MOBIC) 15 MG tablet Take 1 tablet (15 mg total) by mouth daily as needed for pain. 08/22/18   Aline August, MD  nitroGLYCERIN (NITROSTAT) 0.4 MG SL tablet Place 1 tablet (0.4 mg total) under the tongue every 5 (five) minutes as needed for chest pain. 08/22/18   Aline August, MD  olmesartan (BENICAR) 20 MG tablet Take 1 tablet Daily for BP 05/10/20   [name], MD  potassium chloride (K-DUR) 10 MEQ tablet Take 1 tablet 2 x /day 03/29/19   [name], MD  rOPINIRole (REQUIP) 3 MG tablet Take       1 tablet       at Bedtime       for Restless legs 09/08/20   [name], MD  sucralfate (CARAFATE) 1 g tablet Take 1 tablet (1 g total) by mouth 4 (four) times daily. 30 min prior to meals and bedtime. Can dissolve in a few oz of water to help coat esophagus if needed. 08/23/20 08/23/21  [name], NP  vitamin B-12 (CYANOCOBALAMIN) 1000 MCG tablet Take 1,000 mcg by mouth daily.     [provider]  Vitamin D, Ergocalciferol, (DRISDOL) 1.25 MG (50000 UNIT) CAPS capsule Take 1 capsule twice weekly for Severe Vitamin D Deficiency 08/23/20   [name], NP  olmesartan-hydrochlorothiazide (BENICAR HCT) 20-12.5 MG tablet Take 1 tablet Daily for BP 03/30/19 05/10/20  [name], MD    Allergies    Codeine, Flagyl [metronidazole], and Nabumetone  Review of Systems   Review of Systems  Constitutional: Negative for chills, fatigue and fever.  Respiratory: Negative for cough, shortness of breath and wheezing.   Cardiovascular: Positive for chest pain. Negative for palpitations.  Gastrointestinal: Negative for abdominal pain, nausea and vomiting.  Genitourinary: Negative for dysuria and hematuria.  Musculoskeletal: Negative for arthralgias, back pain, myalgias, neck pain and neck stiffness.  Skin: Negative for rash.  Neurological: Negative for dizziness, weakness, numbness and headaches.  Hematological: Does not bruise/bleed easily.    Physical Exam Updated Vital Signs BP (!) 161/80   Pulse (!) 58   Temp 97.9 F (36.6 C) (Oral)   Resp 19   Ht 5\' 4"  (1.626 m)   Wt 74.8 kg   LMP  (LMP Unknown)   SpO2 98%   BMI 28.32 kg/m   Physical Exam Vitals and nursing note reviewed.  Constitutional:      General: She is not in acute distress.    Appearance: Normal appearance. She is not ill-appearing.  HENT:     Head: Normocephalic.     Mouth/Throat:     Mouth: Mucous membranes are moist.  Neck:     Thyroid: No thyromegaly.     Meningeal: Kernig's sign absent.   Cardiovascular:     Rate and Rhythm: Normal rate and regular rhythm.     Pulses: Normal pulses.  Pulmonary:     Effort: Pulmonary effort is normal.     Breath sounds: Normal breath sounds. No wheezing.  Abdominal:     Palpations: Abdomen is soft.     Tenderness: There is no abdominal tenderness. There is no guarding or rebound.  Musculoskeletal:        General: Normal range of motion.     Cervical back: Normal range of motion and neck supple.     Right lower leg: No edema.     Left lower leg: No edema.  Skin:    General: Skin is warm.     Capillary Refill: Capillary refill takes less than 2 seconds.     Findings: No rash.  Neurological:     General: No focal deficit present.     Mental Status: She is alert.     Motor: No weakness.     ED Results / Procedures / Treatments   Labs (all labs ordered are listed, but only abnormal results are displayed) Labs Reviewed  BASIC METABOLIC PANEL - Abnormal; Notable for the following components:      Result Value   Glucose, Bld 100 (*)    Calcium 8.8 (*)    All other components within normal limits  CBC WITH DIFFERENTIAL/PLATELET - Abnormal; Notable for the following components:   RBC 3.65 (*)    Hemoglobin 11.3 (*)    HCT 34.8 (*)    All other components within normal limits  TROPONIN I (HIGH SENSITIVITY)  TROPONIN I (HIGH SENSITIVITY)    EKG EKG Interpretation  Date/Time:  Saturday February 18 2021 14:57:23 EDT Ventricular Rate:  55 PR Interval:  181 QRS Duration: 94 QT Interval:  447 QTC Calculation: 428 R Axis:   15 Text Interpretation: Sinus rhythm Anterior infarct, old  No significant change since last tracing Confirmed by Fredia Sorrow [phone]) on 02/18/2021 3:03:23 PM   Radiology DG Chest Portable 1 View  Result Date: 02/18/2021 CLINICAL DATA:  Chest pain. EXAM: PORTABLE CHEST 1 VIEW COMPARISON:  08/20/2018 FINDINGS: The heart size and mediastinal contours are within normal limits. Aortic atherosclerotic  calcification noted. Both lungs are clear. The visualized skeletal structures are unremarkable. IMPRESSION: No active disease. Electronically Signed   By: Marlaine Hind M.D.   On: 02/18/2021 15:15    Procedures Procedures   Medications Ordered in ED Medications - No data to display  ED Course  I have reviewed the triage vital signs and the nursing notes.  Pertinent labs & imaging results that were available during my care of the patient were reviewed by me and considered in my medical decision making (see chart for details).    MDM Rules/Calculators/A&P                          Patient with history of cardiac angina and bradycardia comes in for evaluation after having central chest pain after pulling weeds in her garden.  Symptoms lasted approximately 30 minutes and relieved after ASA and NTG x 2.  No shortness of breath, no chest pain upon arrival.  Vitals reviewed.   She is followed by Dr. Percival Spanish in [place].  On review of medical records, patient's last echocardiogram October 2019 with EF of 55 to 60%.    On recheck, patient resting comfortably.  Denies symptoms at this time.  Chest x-ray without acute findings, EKG shows sinus rhythm without acute ischemic change.  Labs unremarkable, delta troponin unchanged.  Feel that patient is appropriate for discharge home, and she agrees to close follow-up with her cardiologist on Monday.  The patient appears reasonably screened and/or stabilized for discharge and I doubt any other medical condition or other [facility] requiring further screening, evaluation, or treatment in the ED at this time prior to discharge.    Final Clinical Impression(s) / ED Diagnoses Final diagnoses:  Atypical chest pain    Rx / DC Orders ED Discharge Orders    None       Kem Parkinson, PA-C 02/18/21 2305    Fredia Sorrow, MD 02/19/21 [phone]

## 2021-02-19 ENCOUNTER — Other Ambulatory Visit: Payer: Self-pay | Admitting: Internal Medicine

## 2021-02-19 DIAGNOSIS — G2581 Restless legs syndrome: Secondary | ICD-10-CM

## 2021-02-19 MED ORDER — LEVOTHYROXINE SODIUM 50 MCG PO TABS: ORAL_TABLET | ORAL | 1 refills | Status: DC

## 2021-03-22 ENCOUNTER — Ambulatory Visit: Payer: Federal, State, Local not specified - PPO | Admitting: Adult Health

## 2021-03-28 ENCOUNTER — Ambulatory Visit: Payer: Federal, State, Local not specified - PPO | Admitting: Adult Health

## 2021-05-02 ENCOUNTER — Other Ambulatory Visit: Payer: Self-pay

## 2021-05-02 ENCOUNTER — Ambulatory Visit
Admission: RE | Admit: 2021-05-02 | Discharge: 2021-05-02 | Disposition: A | Discharge status: Home / Self Care | Payer: Federal, State, Local not specified - PPO | Source: Ambulatory Visit | Attending: Internal Medicine | Admitting: Internal Medicine

## 2021-05-02 DIAGNOSIS — Z1231 Encounter for screening mammogram for malignant neoplasm of breast: Secondary | ICD-10-CM

## 2021-05-04 NOTE — Progress Notes (Signed)
FOLLOW UP  Assessment and Plan:  Aortic atherosclerosis (HCC) Control blood pressure, cholesterol, glucose, increase exercise.   CAD Dr. Percival Spanish following Control blood pressure, cholesterol, glucose, increase exercise.  Denies angina, dyspnea   Essential hypertension - continue medications, DASH diet, exercise and monitor at home. Call if greater than 130/80.  -     CBC with Differential/Platelet -     COMPLETE METABOLIC PANEL WITH GFR  Hypothyroidism, unspecified type -check TSH level, continue medications the same, reminded to take on an empty stomach 30-39mins before food.  -     TSH  Hyperlipidemia LDL goal <70 Continue atorvastatin for LDL goal <70 decrease fatty foods increase activity.  -     Lipid panel  Medication management -     Magnesium  Vitamin D deficiency At goal, continue supplement  Check q85m; defer today  Abnormal glucose Recent A1Cs at goal Discussed diet/exercise, weight management  Defer A1C; check CMP  Sinusitis Chronic/recurrent;  Discussed the importance of avoiding unnecessary antibiotic therapy. Suggested symptomatic OTC remedies. Nasal saline spray for congestion. Nasal steroids, allergy pill, oral steroids offered If not improving in 3 days start augmentin Follow up as needed.  Drenching night sweats New, 3-4 months, Hx of smoking, has had several negative PPD over the years with low suspicion of new exposure, some weight loss not fully explained Will get CXR and close follow up in 1 month R/o TB and consider CT chest if continued down trend in weight  Episodic dizziness Without positional trigger; not elicited in office today Former smoker - check carotid US Continue ASA, LDL goal <70 Will have her keep a log of episodes/triggers Consider intermittent arrhythmia and will have her follow up with Dr. Percival Spanish if otherwise unexplained for extended holter monitor Follow up in 1 month   Continue diet and meds as discussed.  Further disposition pending results of labs. Discussed med's effects and SE's.   Over 30 minutes of exam, counseling, chart review, and critical decision making was performed.   Future Appointments  Date Time Provider [place]  06/07/2021 11:30 AM [name], NP [facility] None  07/14/2021 10:30 AM [name], MD [facility] None  12/15/2021 10:00 AM [name], MD [facility] None    ----------------------------------------------------------------------------------------------------------------------  HPI 70 y.o. right handed female  presents for 3 month follow up on hypertension, cholesterol, glucose management, weight (s/p roux-en-y) and vitamin D deficiency.   She reports 3 weeks of persistent nasal congestion, sinus pressure, tenderness under her eyes, thick secretions, post nasal drip, prone to sinus infections. She has been doing mucinex, flonase with some benefit but doesn't resolve. 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   She also notes new drenching night sweats, 3-4 months, hasn't had this in the past. Former smoker. Denies fatigue, cough, dyspnea, unintended weight loss, known TB hx, has had several negative PPD tests in last decade without known new exposures.   BMI is Body mass index is 26.95 kg/m., she has been working on diet and exercise, watching portions, cutting back on sweet tea and no more sugar in tea. She is active but not intentionally exercising. Hx of roux en Y.  Wt Readings from Last 3 Encounters:  05/05/21 157 lb (71.2 kg)  02/18/21 165 lb (74.8 kg)  01/06/21 165 lb 12.8 oz (75.2 kg)   She is followed by Dr. Percival Spanish for bradycardia and CAD (distal small vessel and non-obstructing proximal disease); hx normal cardiac cath x2 / ECHO 2019 EF  55-60% LAE MYOVIEW 3/12 EF 60%. She has aortic atherosclerosis per CT 01/2017. Recently in 01/2021 presented to ED with CP, had normal troponins and EKG, resolved with ASA and  nitro x 2, she was advised to follow up with cardiology ASAP but reports she never did so as had just seen him prior to this event and was advised "all good." She denies CP episodes since.   She does note intermittent episodes of dizziness, "tunnel vision" staggering, nausea, "feel very sick" that hits abruptly without notable trigger. May have standing, walking, sitting or lying in bed. Sense of near syncope without actually passing out.   Her blood pressure has been controlled at home, today their BP is BP: (!) 142/82  She does workout. She denies chest pain, shortness of breath.    She is on cholesterol medication (atorvastatin 40 mg daily) and denies myalgias. Her cholesterol is at goal. The cholesterol last visit was:   Lab Results  Component Value Date   CHOL 102 12/06/2020   HDL 53 12/06/2020   LDLCALC 36 12/06/2020   TRIG 44 12/06/2020   CHOLHDL 1.9 12/06/2020    She has been working on diet and exercise for glucose management, and denies increased appetite, nausea, paresthesia of the feet, polydipsia, polyuria, visual disturbances, vomiting and weight loss. Last A1C in the office was:  Lab Results  Component Value Date   HGBA1C 5.6 12/06/2020   She is on thyroid medication, not on biotin. Her medication was not changed last visit.   Lab Results  Component Value Date   TSH 1.26 12/06/2020   Patient is on Vitamin D supplement hasn't changed dose, reports taking 50000 IU every other day   Lab Results  Component Value Date   VD25OH 96 12/06/2020       Current Medications:  Current Outpatient Medications on File Prior to Visit  Medication Sig   Ascorbic Acid (VITAMIN C) 500 MG CAPS Take 1 tablet by mouth daily.    aspirin 81 MG tablet Take 81 mg by mouth daily.   atorvastatin (LIPITOR) 20 MG tablet Take 1 tablet Daily for Cholesterol   Calcium Citrate-Vitamin D (CALCIUM CITRATE + D3 PO) Take 600 mg by mouth daily.    diphenhydramine-acetaminophen (TYLENOL PM) 25-500 MG  TABS tablet Take 1 tablet by mouth at bedtime as needed.   esomeprazole (NEXIUM) 40 MG capsule Take 1 capsule Daily for Heartburn & Indigestion   Ferrous Sulfate (SLOW RELEASE IRON PO) Take 1 tablet by mouth daily.    fish oil-omega-3 fatty acids 1000 MG capsule Take 1 g by mouth daily.   fluticasone (FLONASE) 50 MCG/ACT nasal spray Place 2 sprays into both nostrils daily as needed for allergies.   gabapentin (NEURONTIN) 100 MG capsule Take 1 capsule 4 x /day for Neuropathy pain   levothyroxine (EUTHYROX) 50 MCG tablet Take  1 tablet  Daily  on an empty stomach with only water for 30 minutes & no Antacid meds, Calcium or Magnesium for 4 hours & avoid Biotin   Magnesium 250 MG TABS Take 1 tablet by mouth daily.    Melatonin 10 MG TABS Take 1 tablet by mouth at bedtime.   meloxicam (MOBIC) 15 MG tablet Take 1 tablet (15 mg total) by mouth daily as needed for pain.   nitroGLYCERIN (NITROSTAT) 0.4 MG SL tablet Place 1 tablet (0.4 mg total) under the tongue every 5 (five) minutes as needed for chest pain.   olmesartan (BENICAR) 20 MG tablet Take 1 tablet Daily  for BP   potassium chloride (K-DUR) 10 MEQ tablet Take 1 tablet 2 x /day   rOPINIRole (REQUIP) 3 MG tablet Take  1 tablet  at Bedtime  for Restless Legs   sucralfate (CARAFATE) 1 g tablet Take 1 tablet (1 g total) by mouth 4 (four) times daily. 30 min prior to meals and bedtime. Can dissolve in a few oz of water to help coat esophagus if needed.   vitamin B-12 (CYANOCOBALAMIN) 1000 MCG tablet Take 1,000 mcg by mouth daily.    Vitamin D, Ergocalciferol, (DRISDOL) 1.25 MG (50000 UNIT) CAPS capsule Take 1 capsule twice weekly for Severe Vitamin D Deficiency   [DISCONTINUED] olmesartan-hydrochlorothiazide (BENICAR HCT) 20-12.5 MG tablet Take 1 tablet Daily for BP   No current facility-administered medications on file prior to visit.     Allergies:  Allergies  Allergen Reactions   Codeine Other (See Comments)    "puts me out of it".   Dysphoria.  Patient tolerates Tramadol without complications.   Flagyl [Metronidazole] Hives   Nabumetone     GI upset     Medical History:  Past Medical History:  Diagnosis Date   Allergic rhinitis    Arthritis    Cholecystitis, chronic    Dysrhythmia    Hx bradycardia - hx normal cardiac cath x2 / ECHO 2009 EF 60% LAE MYOVIEW 3/12 EF 60% NO ISCHEMIA   Gastrointestinal ulcer due to Helicobacter pylori 46/96/2952   Hyperlipidemia    Hypertension    no treatment after 120 lb weight loss   Hypothyroidism    Prediabetes    Thyroid nodule    Family history- Reviewed and unchanged Social history- Reviewed and unchanged   Review of Systems:  Review of Systems  Constitutional:  Negative for malaise/fatigue and weight loss.  HENT:  Positive for congestion and sinus pain. Negative for hearing loss, sore throat and tinnitus.   Eyes:  Negative for blurred vision and double vision.  Respiratory:  Negative for cough, sputum production, shortness of breath and wheezing.   Cardiovascular:  Negative for chest pain, palpitations, orthopnea, claudication and leg swelling.  Gastrointestinal:  Negative for abdominal pain, blood in stool, constipation, diarrhea, heartburn, melena, nausea and vomiting.  Genitourinary: Negative.   Musculoskeletal:  Negative for joint pain and myalgias.  Skin:  Negative for rash.  Neurological:  Positive for dizziness (episodic, without clear trigger, non-exertional). Negative for tingling, sensory change, weakness and headaches.  Endo/Heme/Allergies:  Negative for environmental allergies and polydipsia.  Psychiatric/Behavioral: Negative.  Negative for depression. The patient is not nervous/anxious and does not have insomnia.   All other systems reviewed and are negative.  Physical Exam: BP (!) 142/82   Pulse (!) 53   Temp (!) 97 F (36.1 C)   Wt 157 lb (71.2 kg)   LMP  (LMP Unknown)   SpO2 99%   BMI 26.95 kg/m  Wt Readings from Last 3 Encounters:   05/05/21 157 lb (71.2 kg)  02/18/21 165 lb (74.8 kg)  01/06/21 165 lb 12.8 oz (75.2 kg)   General Appearance: Well nourished, in no apparent distress. Eyes: PERRLA, EOMs, conjunctiva no swelling or erythema Sinuses: No Frontal tenderness, generalized maxillary tenderness, puffy under eyes ENT/Mouth: Ext aud canals clear, TMs without erythema, bulging. No erythema, swelling, or exudate on post pharynx.  Tonsils not swollen or erythematous. Hearing normal.  Neck: Supple, thyroid normal.  Respiratory: Respiratory effort normal, BS equal bilaterally without rales, rhonchi, wheezing or stridor.  Cardio: RRR with no MRGs. Brisk peripheral  pulses without edema.  Abdomen: Soft, + BS.  Non-tender, no guarding, rebound, hernias, masses. Lymphatics: Non tender without lymphadenopathy.  Musculoskeletal: Full ROM, 5/5 strength, Normal gait.  Skin: Warm, dry, intact without rash, concerning lesions, ecchymosis.  Neuro: Cranial nerves intact. No cerebellar symptoms.  Psych: Awake and oriented X 3, normal affect, Insight and Judgment appropriate.   Izora Ribas, NP 9:37 AM [facility]

## 2021-05-05 ENCOUNTER — Encounter: Payer: Self-pay | Admitting: Adult Health

## 2021-05-05 ENCOUNTER — Ambulatory Visit
Admission: RE | Admit: 2021-05-05 | Discharge: 2021-05-05 | Disposition: A | Discharge status: Home / Self Care | Payer: Federal, State, Local not specified - PPO | Source: Ambulatory Visit | Attending: Adult Health | Admitting: Adult Health

## 2021-05-05 ENCOUNTER — Ambulatory Visit: Payer: Federal, State, Local not specified - PPO | Admitting: Adult Health

## 2021-05-05 ENCOUNTER — Other Ambulatory Visit: Payer: Self-pay

## 2021-05-05 VITALS — BP 142/82 | HR 53 | Temp 97.0°F | Wt 157.0 lb

## 2021-05-05 DIAGNOSIS — E785 Hyperlipidemia, unspecified: Secondary | ICD-10-CM

## 2021-05-05 DIAGNOSIS — E559 Vitamin D deficiency, unspecified: Secondary | ICD-10-CM

## 2021-05-05 DIAGNOSIS — R61 Generalized hyperhidrosis: Secondary | ICD-10-CM

## 2021-05-05 DIAGNOSIS — E039 Hypothyroidism, unspecified: Secondary | ICD-10-CM

## 2021-05-05 DIAGNOSIS — I2510 Atherosclerotic heart disease of native coronary artery without angina pectoris: Secondary | ICD-10-CM | POA: Diagnosis not present

## 2021-05-05 DIAGNOSIS — Z79899 Other long term (current) drug therapy: Secondary | ICD-10-CM

## 2021-05-05 DIAGNOSIS — E663 Overweight: Secondary | ICD-10-CM

## 2021-05-05 DIAGNOSIS — J0100 Acute maxillary sinusitis, unspecified: Secondary | ICD-10-CM

## 2021-05-05 DIAGNOSIS — I10 Essential (primary) hypertension: Secondary | ICD-10-CM

## 2021-05-05 DIAGNOSIS — I700 Atherosclerosis of aorta: Secondary | ICD-10-CM

## 2021-05-05 DIAGNOSIS — R42 Dizziness and giddiness: Secondary | ICD-10-CM

## 2021-05-05 LAB — LIPID PANEL
Cholesterol: 132 mg/dL (ref <200)
HDL: 71 mg/dL (ref >=50)
LDL Cholesterol (Calc): 49 mg/dL (calc)
Non-HDL Cholesterol (Calc): 61 mg/dL (calc) (ref <130)
Total CHOL/HDL Ratio: 1.9 (calc) (ref <5.0)
Triglycerides: 50 mg/dL (ref <150)

## 2021-05-05 LAB — TSH: TSH: 2.9 mIU/L (ref 0.40–4.50)

## 2021-05-05 LAB — COMPLETE METABOLIC PANEL WITH GFR
AG Ratio: 1.7 (calc) (ref 1.0–2.5)
ALT: 16 U/L (ref 6–29)
AST: 22 U/L (ref 10–35)
Albumin: 4 g/dL (ref 3.6–5.1)
Alkaline phosphatase (APISO): 112 U/L (ref 37–153)
BUN: 13 mg/dL (ref 7–25)
CO2: 25 mmol/L (ref 20–32)
Calcium: 9 mg/dL (ref 8.6–10.4)
Chloride: 108 mmol/L (ref 98–110)
Creat: 0.84 mg/dL (ref 0.50–0.99)
GFR, Est African American: 83 mL/min/{1.73_m2} (ref >=60)
GFR, Est Non African American: 71 mL/min/{1.73_m2} (ref >=60)
Globulin: 2.4 g/dL (calc) (ref 1.9–3.7)
Glucose, Bld: 87 mg/dL (ref 65–99)
Potassium: 4.6 mmol/L (ref 3.5–5.3)
Sodium: 142 mmol/L (ref 135–146)
Total Bilirubin: 1.2 mg/dL (ref 0.2–1.2)
Total Protein: 6.4 g/dL (ref 6.1–8.1)

## 2021-05-05 LAB — CBC WITH DIFFERENTIAL/PLATELET
Absolute Monocytes: 405 cells/uL (ref 200–950)
Basophils Absolute: 69 cells/uL (ref 0–200)
Basophils Relative: 1.5 %
Eosinophils Absolute: 363 cells/uL (ref 15–500)
Eosinophils Relative: 7.9 %
HCT: 39.1 % (ref 35.0–45.0)
Hemoglobin: 12.6 g/dL (ref 11.7–15.5)
Lymphs Abs: 2107 cells/uL (ref 850–3900)
MCH: 30.1 pg (ref 27.0–33.0)
MCHC: 32.2 g/dL (ref 32.0–36.0)
MCV: 93.3 fL (ref 80.0–100.0)
MPV: 10.7 fL (ref 7.5–12.5)
Monocytes Relative: 8.8 %
Neutro Abs: 1656 cells/uL (ref 1500–7800)
Neutrophils Relative %: 36 %
Platelets: 393 10*3/uL (ref 140–400)
RBC: 4.19 10*6/uL (ref 3.80–5.10)
RDW: 13.3 % (ref 11.0–15.0)
Total Lymphocyte: 45.8 %
WBC: 4.6 10*3/uL (ref 3.8–10.8)

## 2021-05-05 LAB — MAGNESIUM: Magnesium: 2.1 mg/dL (ref 1.5–2.5)

## 2021-05-05 MED ORDER — PREDNISONE 20 MG PO TABS: ORAL_TABLET | ORAL | 0 refills | Status: DC

## 2021-05-05 MED ORDER — AMOXICILLIN-POT CLAVULANATE 875-125 MG PO TABS: 1.0000 | ORAL_TABLET | Freq: Two times a day (BID) | ORAL | 0 refills | Status: AC

## 2021-05-05 NOTE — Patient Instructions (Signed)
Please get chest xray at [address] imaging center No appointment needed - can walk in M-F 8-4   Please keep a log of dizziness episodes - how long, anything unusual, did anything help or make worse   Dizziness Dizziness is a common problem. It is a feeling of unsteadiness or light-headedness. You may feel like you are about to faint. Dizziness can lead to injury if you stumble or fall. Anyone can become dizzy, but dizziness is more common in older adults. This condition can be caused by a number ofthings, including medicines, dehydration, or illness. Follow these instructions at home: Eating and drinking  Drink enough fluid to keep your urine pale yellow. This helps to keep you from becoming dehydrated. Try to drink more clear fluids, such as water. Do not drink alcohol. Limit your caffeine intake if told to do so by your health care provider. Check ingredients and nutrition facts to see if a food or beverage contains caffeine. Limit your salt (sodium) intake if told to do so by your health care provider. Check ingredients and nutrition facts to see if a food or beverage contains sodium.  Activity  Avoid making quick movements. Rise slowly from chairs and steady yourself until you feel okay. In the morning, first sit up on the side of the bed. When you feel okay, stand slowly while you hold onto something until you know that your balance is good. If you need to stand in one place for a long time, move your legs often. Tighten and relax the muscles in your legs while you are standing. Do not drive or use machinery if you feel dizzy. Avoid bending down if you feel dizzy. Place items in your home so that they are easy for you to reach without leaning over.  Lifestyle Do not use any products that contain nicotine or tobacco. These products include cigarettes, chewing tobacco, and vaping devices, such as e-cigarettes. If you need help quitting, ask your health care provider. Try to  reduce your stress level by using methods such as yoga or meditation. Talk with your health care provider if you need help to manage your stress. General instructions Watch your dizziness for any changes. Take over-the-counter and prescription medicines only as told by your health care provider. Talk with your health care provider if you think that your dizziness is caused by a medicine that you are taking. Tell a friend or a family member that you are feeling dizzy. If he or she notices any changes in your behavior, have this person call your health care provider. Keep all follow-up visits. This is important. Contact a health care provider if: Your dizziness does not go away or you have new symptoms. Your dizziness or light-headedness gets worse. You feel nauseous. You have reduced hearing. You have a fever. You have neck pain or a stiff neck. Your dizziness leads to an injury or a fall. Get help right away if: You vomit or have diarrhea and are unable to eat or drink anything. You have problems talking, walking, swallowing, or using your arms, hands, or legs. You feel generally weak. You have any bleeding. You are not thinking clearly or you have trouble forming sentences. It may take a friend or family member to notice this. You have chest pain, abdominal pain, shortness of breath, or sweating. Your vision changes or you develop a severe headache. These symptoms may represent a serious problem that is an emergency. Do not wait to see if the  symptoms will go away. Get medical help right away. Call your local emergency services (911 in the [place] Do not drive yourself to the hospital. Summary Dizziness is a feeling of unsteadiness or light-headedness. This condition can be caused by a number of things, including medicines, dehydration, or illness. Anyone can become dizzy, but dizziness is more common in older adults. Drink enough fluid to keep your urine pale yellow. Do not drink  alcohol. Avoid making quick movements if you feel dizzy. Monitor your dizziness for any changes. This information is not intended to replace advice given to you by your health care provider. Make sure you discuss any questions you have with your healthcare provider. Document Revised: 09/19/2020 Document Reviewed: 09/19/2020 Elsevier Patient Education  2022 [employer].

## 2021-05-11 ENCOUNTER — Ambulatory Visit (HOSPITAL_COMMUNITY)
Admission: RE | Admit: 2021-05-11 | Discharge: 2021-05-11 | Disposition: A | Discharge status: Home / Self Care | Payer: Federal, State, Local not specified - PPO | Source: Ambulatory Visit | Attending: Cardiovascular Disease | Admitting: Cardiovascular Disease

## 2021-05-11 ENCOUNTER — Telehealth: Payer: Self-pay | Admitting: *Deleted

## 2021-05-11 ENCOUNTER — Other Ambulatory Visit: Payer: Self-pay

## 2021-05-11 ENCOUNTER — Encounter: Payer: Self-pay | Admitting: Adult Health

## 2021-05-11 DIAGNOSIS — R42 Dizziness and giddiness: Secondary | ICD-10-CM

## 2021-05-11 DIAGNOSIS — I6523 Occlusion and stenosis of bilateral carotid arteries: Secondary | ICD-10-CM | POA: Insufficient documentation

## 2021-05-11 DIAGNOSIS — R55 Syncope and collapse: Secondary | ICD-10-CM

## 2021-05-11 NOTE — Telephone Encounter (Signed)
-----   Message from Minus Breeding, MD sent at 05/11/2021  4:50 PM EDT ----- Regarding: RE: Near syncope events Hi Caryl Pina,  We will send her a two week Zio patch.  Thanks.    ----- Message ----- From: [name], NP Sent: 05/11/2021   1:53 PM EDT To: Minus Breeding, MD Subject: Near syncope events                            Good afternoon Dr. Percival Spanish,   I wanted to reach out to you re: a mutual patient Ms. Liese. At a recent appointment she reported new non-exertional/non-positional episodes of tunnel vision, dizziness with near syncope with unclear trigger ongoing for several months. Labs were unremarkable, no arrhythmia or murmur at time of exam. I did go ahead and get carotid US due to her smoking history that showed bilateral <39%. We discussed possible intermittent arrhythmia and wondered if an extended holter monitor might be of value for her through your office.   We appreciate your expertise and input.   Sincerely,  [name], NP-C

## 2021-05-12 ENCOUNTER — Encounter: Payer: Self-pay | Admitting: *Deleted

## 2021-05-12 NOTE — Telephone Encounter (Signed)
Discussed with Dr Percival Spanish and will have patient wear Preventice since live monitor given near syncope  Advised patient, order placed, and instructions sent through [facility]

## 2021-05-12 NOTE — Progress Notes (Signed)
14 day Zio mailed to pt.

## 2021-05-15 ENCOUNTER — Other Ambulatory Visit: Payer: Self-pay

## 2021-05-15 ENCOUNTER — Ambulatory Visit (INDEPENDENT_AMBULATORY_CARE_PROVIDER_SITE_OTHER): Payer: Federal, State, Local not specified - PPO

## 2021-05-15 DIAGNOSIS — R42 Dizziness and giddiness: Secondary | ICD-10-CM

## 2021-05-15 DIAGNOSIS — R55 Syncope and collapse: Secondary | ICD-10-CM

## 2021-05-15 NOTE — Progress Notes (Unsigned)
14 day Preventice Cardiac event monitor from office inventory applied in office.  Enrollment from 05/12/21 to mail was cancelled.

## 2021-05-22 ENCOUNTER — Telehealth: Payer: Self-pay

## 2021-05-22 ENCOUNTER — Ambulatory Visit (INDEPENDENT_AMBULATORY_CARE_PROVIDER_SITE_OTHER): Payer: Federal, State, Local not specified - PPO | Admitting: Adult Health

## 2021-05-22 ENCOUNTER — Other Ambulatory Visit: Payer: Self-pay

## 2021-05-22 ENCOUNTER — Encounter: Payer: Self-pay | Admitting: Adult Health

## 2021-05-22 VITALS — HR 61 | Temp 97.9°F

## 2021-05-22 DIAGNOSIS — Z1152 Encounter for screening for COVID-19: Secondary | ICD-10-CM

## 2021-05-22 DIAGNOSIS — J329 Chronic sinusitis, unspecified: Secondary | ICD-10-CM | POA: Diagnosis not present

## 2021-05-22 DIAGNOSIS — U071 COVID-19: Secondary | ICD-10-CM | POA: Diagnosis not present

## 2021-05-22 LAB — POC COVID19 BINAXNOW: SARS Coronavirus 2 Ag: POSITIVE — AB

## 2021-05-22 MED ORDER — AMOXICILLIN-POT CLAVULANATE 875-125 MG PO TABS: 1.0000 | ORAL_TABLET | Freq: Two times a day (BID) | ORAL | 0 refills | Status: AC

## 2021-05-22 MED ORDER — PROMETHAZINE-DM 6.25-15 MG/5ML PO SYRP: 5.0000 mL | ORAL_SOLUTION | Freq: Four times a day (QID) | ORAL | 1 refills | Status: DC | PRN

## 2021-05-22 MED ORDER — DEXAMETHASONE 1 MG PO TABS: ORAL_TABLET | ORAL | 0 refills | Status: DC

## 2021-05-22 NOTE — Progress Notes (Signed)
THIS ENCOUNTER IS A VIRTUAL VISIT DUE TO COVID-19 - PATIENT WAS NOT SEEN IN THE OFFICE.  PATIENT HAS CONSENTED TO VIRTUAL VISIT / TELEMEDICINE VISIT   Virtual Visit via telephone Note  I connected with  Jacqualine Code on 05/22/2021 by telephone.  I verified that I am speaking with the correct person using two identifiers.    I discussed the limitations of evaluation and management by telemedicine and the availability of in person appointments. The patient expressed understanding and agreed to proceed.  History of Present Illness:  Pulse 61   Temp 97.9 F (36.6 C)   LMP  (LMP Unknown)   SpO2 96%  70 y.o. with hx of smoking, CAD, htn contacted office reporting URI sx. she tested positive by rapid screen drive through test prior to being seen. OV was conducted by telephone to minimize exposure. This patient was vaccinated for covid 19, 2/2, last 12/2019, no booster.    Sx began 05/19/2021, 4 days ago with congestion, sinus tenderness, HA (sinus), then developed chills. She reports L>R sinus tenderness with pressing her eyes or cheeks. She also developed cough day 2, denies CP (other than with coughing), wheezing, dyspnea. Denies dizziness, nausea, myalgias, GI sx.   She has tried flonase, saline irrigations, antihistamine, mucinex, tessalon (mild benefit).   Exposures: unclear, SIL had 2-3 weeks ago.    Medications  Current Outpatient Medications (Endocrine & Metabolic):    levothyroxine (EUTHYROX) 50 MCG tablet, Take  1 tablet  Daily  on an empty stomach with only water for 30 minutes & no Antacid meds, Calcium or Magnesium for 4 hours & avoid Biotin   predniSONE (DELTASONE) 20 MG tablet, 2 tablets daily for 3 days, 1 tablet daily for 4 days.  Current Outpatient Medications (Cardiovascular):    atorvastatin (LIPITOR) 20 MG tablet, Take 1 tablet Daily for Cholesterol   nitroGLYCERIN (NITROSTAT) 0.4 MG SL tablet, Place 1 tablet (0.4 mg total) under the tongue every 5 (five) minutes as  needed for chest pain.   olmesartan (BENICAR) 20 MG tablet, Take 1 tablet Daily for BP  Current Outpatient Medications (Respiratory):    fluticasone (FLONASE) 50 MCG/ACT nasal spray, Place 2 sprays into both nostrils daily as needed for allergies.  Current Outpatient Medications (Analgesics):    aspirin 81 MG tablet, Take 81 mg by mouth daily.   meloxicam (MOBIC) 15 MG tablet, Take 1 tablet (15 mg total) by mouth daily as needed for pain.  Current Outpatient Medications (Hematological):    Ferrous Sulfate (SLOW RELEASE IRON PO), Take 1 tablet by mouth daily.    vitamin B-12 (CYANOCOBALAMIN) 1000 MCG tablet, Take 1,000 mcg by mouth daily.   Current Outpatient Medications (Other):    Ascorbic Acid (VITAMIN C) 500 MG CAPS, Take 1 tablet by mouth daily.    Calcium Citrate-Vitamin D (CALCIUM CITRATE + D3 PO), Take 600 mg by mouth daily.    diphenhydramine-acetaminophen (TYLENOL PM) 25-500 MG TABS tablet, Take 1 tablet by mouth at bedtime as needed.   esomeprazole (NEXIUM) 40 MG capsule, Take 1 capsule Daily for Heartburn & Indigestion   fish oil-omega-3 fatty acids 1000 MG capsule, Take 1 g by mouth daily.   gabapentin (NEURONTIN) 100 MG capsule, Take 1 capsule 4 x /day for Neuropathy pain   Magnesium 250 MG TABS, Take 1 tablet by mouth daily.    Melatonin 10 MG TABS, Take 1 tablet by mouth at bedtime.   potassium chloride (K-DUR) 10 MEQ tablet, Take 1 tablet 2 x /day  rOPINIRole (REQUIP) 3 MG tablet, Take  1 tablet  at Bedtime  for Restless Legs   sucralfate (CARAFATE) 1 g tablet, Take 1 tablet (1 g total) by mouth 4 (four) times daily. 30 min prior to meals and bedtime. Can dissolve in a few oz of water to help coat esophagus if needed.   Vitamin D, Ergocalciferol, (DRISDOL) 1.25 MG (50000 UNIT) CAPS capsule, Take 1 capsule twice weekly for Severe Vitamin D Deficiency  Allergies:  Allergies  Allergen Reactions   Codeine Other (See Comments)    "puts me out of it".  Dysphoria.  Patient  tolerates Tramadol without complications.   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   Social History:   reports that she quit smoking about 41 years ago. Her smoking use included cigarettes. She has a 8.00 pack-year smoking history. She has never used smokeless tobacco. She reports that she does not drink alcohol and does not use drugs.  Observations/Objective:  General : Well sounding patient in no apparent distress HEENT: mildly hoarseness, occasional hacking cough. She reports tenderness to left cheek with palpation.  Lungs: speaks in complete sentences, no audible wheezing, no apparent distress Neurological: alert, oriented x 3 Psychiatric: pleasant, judgement appropriate   Assessment and Plan:  Covid 19 Covid 19 positive per rapid screening test by [facility] here  Risk factors include: former smoker, htn, age Symptoms are: mild/mod Discussed antivirals but med decision decided to decline Immue support with vitamin D 1000 mg daily, zinc 50 mg daily, vitamin D Reviewed doing regular breathing exercises, proning Take tylenol PRN temp 101+ Push hydration Regular ambulation or calf exercises exercises for clot prevention and 81 mg ASA unless contraindicated Sx supportive therapy suggested Steroid taper sent in  However with sinus  tenderness, chills, baseline congestion/chronic sinusitis may have superimposed, will proceed with presumptive sinusitis tx as well Follow up via mychart or telephone if needed Advised patient obtain O2 monitor; present to ED if persistently <88% or with severe dyspnea, CP, fever uncontrolled by tylenol, confusion, sudden decline Should remain in isolation until at least 5 days from onset of sx, 24-48 hours fever free without tylenol, sx such as cough are improved.     Follow Up Instructions:  I discussed the assessment and treatment plan with the patient. The patient was provided an opportunity to ask questions and all were answered. The patient agreed with the plan and demonstrated an understanding of the instructions.   The patient was advised to call back or seek an in-person evaluation if the symptoms worsen or if the condition fails to improve as anticipated.  I provided 20 minutes of non-face-to-face time during this encounter.   Izora Ribas, NP

## 2021-05-22 NOTE — Telephone Encounter (Signed)
Sinus infection has gotten worse. Has pressure around eyes, headache, runny nose and cough. Has completed all prescribed meds. Please advise.

## 2021-06-07 ENCOUNTER — Ambulatory Visit: Payer: Federal, State, Local not specified - PPO | Admitting: Adult Health

## 2021-06-26 ENCOUNTER — Ambulatory Visit: Payer: Federal, State, Local not specified - PPO | Admitting: Adult Health

## 2021-07-04 NOTE — Progress Notes (Signed)
FOLLOW UP  Assessment and Plan:   Depression/Anxiety Start new medication as prescribed - low dose lexapro, hx of sedation with  high dose Stress management techniques discussed, increase water, good sleep hygiene discussed, increase exercise, and increase veggies.  Follow up 1-2 month, call the office if any new AE's from medications and we will switch them -     escitalopram (LEXAPRO) 5 MG tablet; Take 1 tab daily for mood.  Drenching night sweats Reports improved, had normal CXR, no further weight loss Declines further interventions at this time, but may benefit from lexapro  Episodic dizziness Without positional trigger; not elicited in office today ? With palpitations/skipped beats, not caught on holter but didn't report an episode during that time Recommended she follow up with Dr. Percival Spanish to discuss further  Hypertension Persistently above goal -  ? Off of olmesartan - she will check meds at home, call if needing refill Monitor blood pressure at home; call if consistently over 130/80 If taking current meds will plan switch back to olmesartan/HCTZ Continue DASH diet.   Reminder to go to the ER if any CP, SOB, nausea, dizziness, severe HA, changes vision/speech, left arm numbness and tingling and jaw pain.  Persistent sinusitis Nasal saline spray for congestion. Nasal steroids, allergy pill, oral steroids offered Follow up if persistent, will refer to ENT due to persistence and hx of structural issue/surgery  -     predniSONE (DELTASONE) 20 MG tablet; 3 tablets daily with food for 3 days, 2 tabs daily for 3 days, 1 tab a day for 5 days. -     azithromycin (ZITHROMAX) 250 MG tablet; Take 2 tablets (500 mg) on  Day 1,  followed by 1 tablet (250 mg) once daily on Days 2 through 5.   Continue diet and meds as discussed. Further disposition pending results of labs. Discussed med's effects and SE's.   Over 30 minutes of exam, counseling, chart review, and critical decision  making was performed.   Future Appointments  Date Time Provider C[place]  08/29/2021 10:30 AM [name], MD [facility] None  12/15/2021 10:00 AM [name], MD [facility] None    ----------------------------------------------------------------------------------------------------------------------  HPI 70 y.o. right handed female  presents for overdue follow up dizziness, night sweats, weight loss.   Last visit she reported new drenching night sweats, 3-4 months, hasn't had this in the past. Former smoker. Denied fatigue, cough, dyspnea, had lost 8 lb without notable effort, known TB hx, has had several negative PPD tests in last decade without known new exposures. She had unremarkable CXR 05/05/2021. Today she reports night sweats are much improved, having rarely.   She does note intermittent episodes of dizziness, "tunnel vision" staggering, nausea, "feel very sick" that hits abruptly without notable trigger. May have standing, walking, sitting or lying in bed. Sense of near syncope without actually passing out. Carotid US 05/11/2021 showed bilateral stenosis <39%.   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 She has aortic atherosclerosis per CT 01/2017. Recently in 01/2021 presented to ED with CP, had normal troponins and EKG, resolved with ASA and nitro x 2, she was advised to follow up with cardiology ASAP but reports she never did so as had just seen him prior to this event and was advised "all good." She denies CP episodes since. She was referred for holter in July 2022,  showed NSR, sinus brady/ventricular bigeimny during sleeping hours, 3 sec pauses, brief SVT. Today she notes did not have any episodes while wearing holter. Has improved significantly but does have every few weeks, notes sense of palpiations/skipping beats with this.  She has not scheduled follow up with Dr. Percival Spanish but receptive and encouraged to do so.   BMI is Body mass index is 26.91 kg/m., she has been working on diet and exercise, watching portions, cutting back on sweet tea and no more sugar in tea. She is active but not intentionally exercising. Hx of roux en Y. No atypical weight loss since last OV.  Wt Readings from Last 3 Encounters:  07/05/21 156 lb 12.8 oz (71.1 kg)  05/05/21 157 lb (71.2 kg)  02/18/21 165 lb (74.8 kg)   She reports hasn't been checking BP at home, today their BP is BP: (!) 142/80 On review persistently elevated last few checks. ? Since switching from olmesartan/HCTZ to olmesartan last year, also ? No recent refills - will verify meds at home   She does workout. She denies chest pain, shortness of breath.   She reports had covid 19 in July, has had persistent nasal congestion, sinus tenderness, mild HA. Has had some chills. Didn't improve with steroid taper, on daily antihistamine, irrigations, flonase. Does have hx of sinus/septal surgery remotely.   Also reports persistent short fused, gets frustrated easily. Interested in getting back on mood meds, reports has taken lexaro remotely  Current Medications:  Current Outpatient Medications on File Prior to Visit  Medication Sig   Ascorbic Acid (VITAMIN C) 500 MG CAPS Take 1 tablet by mouth daily.    aspirin 81 MG tablet Take 81 mg by mouth daily.   atorvastatin (LIPITOR) 20 MG tablet Take 1 tablet Daily for Cholesterol   Calcium Citrate-Vitamin D (CALCIUM CITRATE + D3 PO) Take 600 mg by mouth daily.    diphenhydramine-acetaminophen (TYLENOL PM) 25-500 MG TABS tablet Take 1 tablet by mouth at bedtime as needed.   esomeprazole (NEXIUM) 40 MG capsule Take 1 capsule Daily for Heartburn & Indigestion   Ferrous Sulfate (SLOW RELEASE IRON PO) Take 1 tablet by mouth daily.    fish oil-omega-3 fatty acids 1000 MG capsule Take 1 g by mouth daily.   fluticasone (FLONASE) 50 MCG/ACT  nasal spray Place 2 sprays into both nostrils daily as needed for allergies.   gabapentin (NEURONTIN) 100 MG capsule Take 1 capsule 4 x /day for Neuropathy pain   levothyroxine (EUTHYROX) 50 MCG tablet Take  1 tablet  Daily  on an empty stomach with only water for 30 minutes & no Antacid meds, Calcium or Magnesium for 4 hours & avoid Biotin   Magnesium 250 MG TABS Take 1 tablet by mouth daily.    Melatonin 10 MG TABS Take 1 tablet by mouth at bedtime.   meloxicam (MOBIC) 15 MG tablet Take 1 tablet (15 mg total) by mouth daily as needed for pain.   nitroGLYCERIN (NITROSTAT) 0.4 MG SL tablet Place 1 tablet (0.4 mg total) under the tongue every 5 (five) minutes as needed for chest pain.   olmesartan (BENICAR) 20 MG tablet Take 1 tablet Daily for BP   potassium chloride (K-DUR) 10 MEQ tablet Take 1 tablet 2 x /day   rOPINIRole (REQUIP) 3 MG tablet Take  1 tablet  at Bedtime  for Restless Legs   sucralfate (CARAFATE) 1 g tablet Take 1 tablet (1 g total) by mouth 4 (four) times daily. 30 min prior to  meals and bedtime. Can dissolve in a few oz of water to help coat esophagus if needed.   vitamin B-12 (CYANOCOBALAMIN) 1000 MCG tablet Take 1,000 mcg by mouth daily.    Vitamin D, Ergocalciferol, (DRISDOL) 1.25 MG (50000 UNIT) CAPS capsule Take 1 capsule twice weekly for Severe Vitamin D Deficiency   dexamethasone (DECADRON) 1 MG tablet Take 3 tabs for 3 days, 2 tabs for 3 days 1 tab for 5 days. Take with food.   promethazine-dextromethorphan (PROMETHAZINE-DM) 6.25-15 MG/5ML syrup Take 5 mLs by mouth 4 (four) times daily as needed for cough.   [DISCONTINUED] olmesartan-hydrochlorothiazide (BENICAR HCT) 20-12.5 MG tablet Take 1 tablet Daily for BP   No current facility-administered medications on file prior to visit.     Allergies:  Allergies  Allergen Reactions   Codeine Other (See Comments)    "puts me out of it".  Dysphoria.  Patient tolerates Tramadol without complications.   Flagyl  [Metronidazole] Hives   Nabumetone     GI upset     Medical History:  Past Medical History:  Diagnosis Date   Allergic rhinitis    Arthritis    Cholecystitis, chronic    Dysrhythmia    Hx bradycardia - hx normal cardiac cath x2 / ECHO 2009 EF 60% LAE MYOVIEW 3/12 EF 60% NO ISCHEMIA   Gastrointestinal ulcer due to Helicobacter pylori 0000000   Hyperlipidemia    Hypertension    no treatment after 120 lb weight loss   Hypothyroidism    Prediabetes    Thyroid nodule    Family history- Reviewed and unchanged Social history- Reviewed and unchanged   Review of Systems:  Review of Systems  Constitutional:  Negative for malaise/fatigue and weight loss.  HENT:  Positive for congestion and sinus pain. Negative for hearing loss, sore throat and tinnitus.   Eyes:  Negative for blurred vision and double vision.  Respiratory:  Negative for cough, sputum production, shortness of breath and wheezing.   Cardiovascular:  Positive for palpitations (intermittent, skipping, does correlate with dizzy episodes.). Negative for chest pain, orthopnea, claudication and leg swelling.  Gastrointestinal:  Negative for abdominal pain, blood in stool, constipation, diarrhea, heartburn, melena, nausea and vomiting.  Genitourinary: Negative.   Musculoskeletal:  Negative for joint pain and myalgias.  Skin:  Negative for rash.  Neurological:  Positive for dizziness (episodic, without clear trigger, non-exertional). Negative for tingling, sensory change, weakness and headaches.  Endo/Heme/Allergies:  Negative for environmental allergies and polydipsia.  Psychiatric/Behavioral: Negative.  Negative for depression. The patient is not nervous/anxious and does not have insomnia.   All other systems reviewed and are negative.  Physical Exam: BP (!) 142/80   Pulse (!) 47   Temp (!) 97.5 F (36.4 C)   Wt 156 lb 12.8 oz (71.1 kg)   LMP  (LMP Unknown)   SpO2 99%   BMI 26.91 kg/m  Wt Readings from Last 3  Encounters:  07/05/21 156 lb 12.8 oz (71.1 kg)  05/05/21 157 lb (71.2 kg)  02/18/21 165 lb (74.8 kg)   General Appearance: Well nourished, in no apparent distress. Eyes: PERRLA, EOMs, conjunctiva no swelling or erythema Sinuses: No Frontal tenderness, generalized maxillary tenderness, puffy under eyes ENT/Mouth: Ext aud canals clear, TMs without erythema, bulging. No erythema, swelling, or exudate on post pharynx.  Tonsils not swollen or erythematous. Hearing normal.  Neck: Supple, thyroid normal.  Respiratory: Respiratory effort normal, BS equal bilaterally without rales, rhonchi, wheezing or stridor.  Cardio: RRR with no MRGs. Brisk peripheral  pulses without edema.  Abdomen: Soft, + BS.  Non-tender, no guarding, rebound, hernias, masses. Lymphatics: Non tender without lymphadenopathy.  Musculoskeletal: Full ROM, 5/5 strength, Normal gait.  Skin: Warm, dry, intact without rash, concerning lesions, ecchymosis.  Neuro: Cranial nerves intact. No cerebellar symptoms.  Psych: Awake and oriented X 3, normal affect, Insight and Judgment appropriate.   Izora Ribas, NP 10:08 AM [facility]

## 2021-07-05 ENCOUNTER — Other Ambulatory Visit: Payer: Self-pay

## 2021-07-05 ENCOUNTER — Ambulatory Visit: Payer: Federal, State, Local not specified - PPO | Admitting: Adult Health

## 2021-07-05 ENCOUNTER — Encounter: Payer: Self-pay | Admitting: Adult Health

## 2021-07-05 VITALS — BP 142/80 | HR 47 | Temp 97.5°F | Wt 156.8 lb

## 2021-07-05 DIAGNOSIS — J320 Chronic maxillary sinusitis: Secondary | ICD-10-CM | POA: Diagnosis not present

## 2021-07-05 DIAGNOSIS — F418 Other specified anxiety disorders: Secondary | ICD-10-CM | POA: Diagnosis not present

## 2021-07-05 DIAGNOSIS — I10 Essential (primary) hypertension: Secondary | ICD-10-CM | POA: Diagnosis not present

## 2021-07-05 DIAGNOSIS — R42 Dizziness and giddiness: Secondary | ICD-10-CM | POA: Diagnosis not present

## 2021-07-05 MED ORDER — ESCITALOPRAM OXALATE 5 MG PO TABS: ORAL_TABLET | ORAL | 0 refills | Status: AC

## 2021-07-05 MED ORDER — AZITHROMYCIN 250 MG PO TABS: ORAL_TABLET | ORAL | 1 refills | Status: AC

## 2021-07-05 MED ORDER — PREDNISONE 20 MG PO TABS: ORAL_TABLET | ORAL | 0 refills | Status: AC

## 2021-07-13 IMAGING — CR DG CHEST 2V
2 series · 2 of 2 positions shown · non-contrast
Comparison: February 18, 2021

CLINICAL DATA: Night sweats for 3-4 months. Former smoker. 8 pounds
of weight loss.

EXAM:
CHEST - 2 VIEW

[w chest pa]
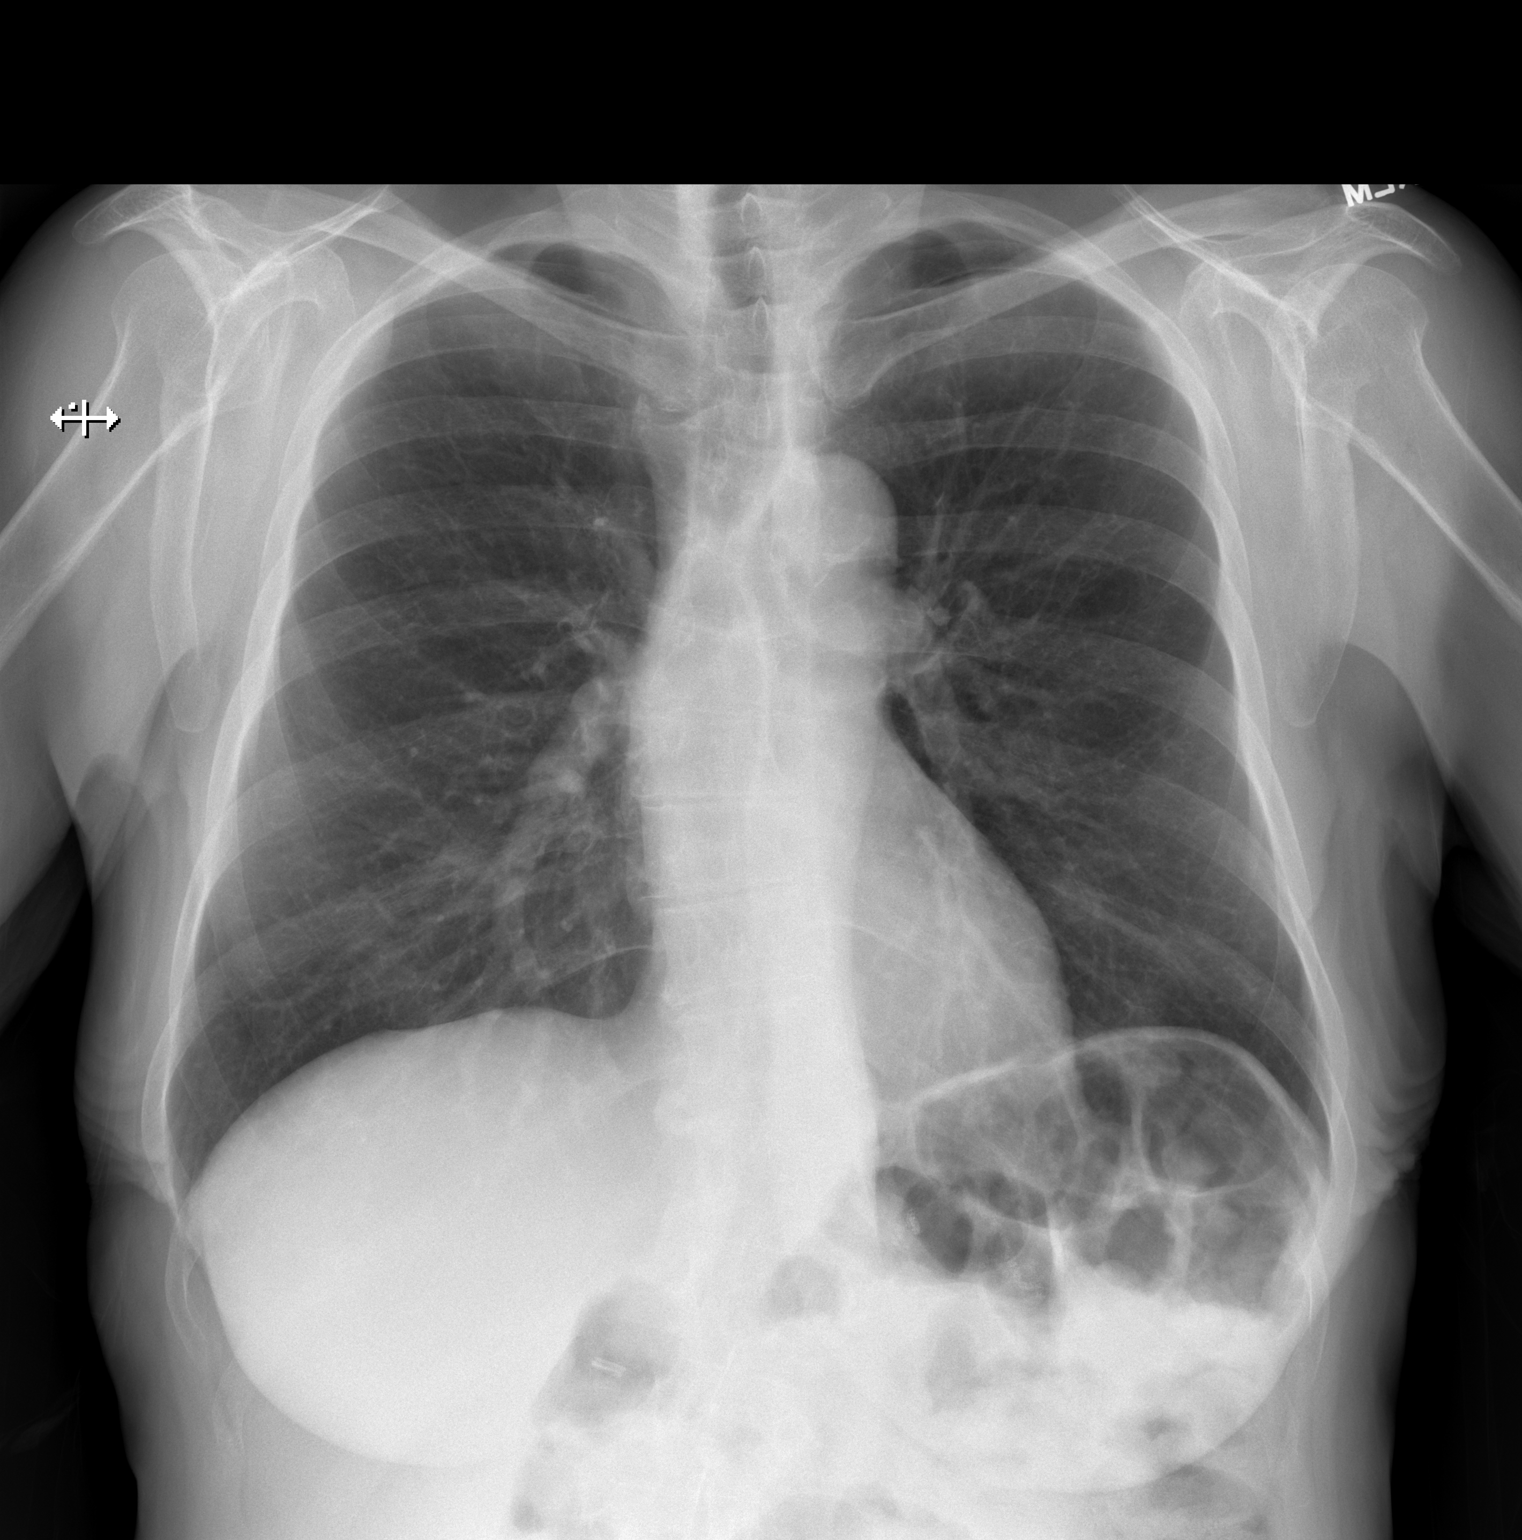

[w chest lat]
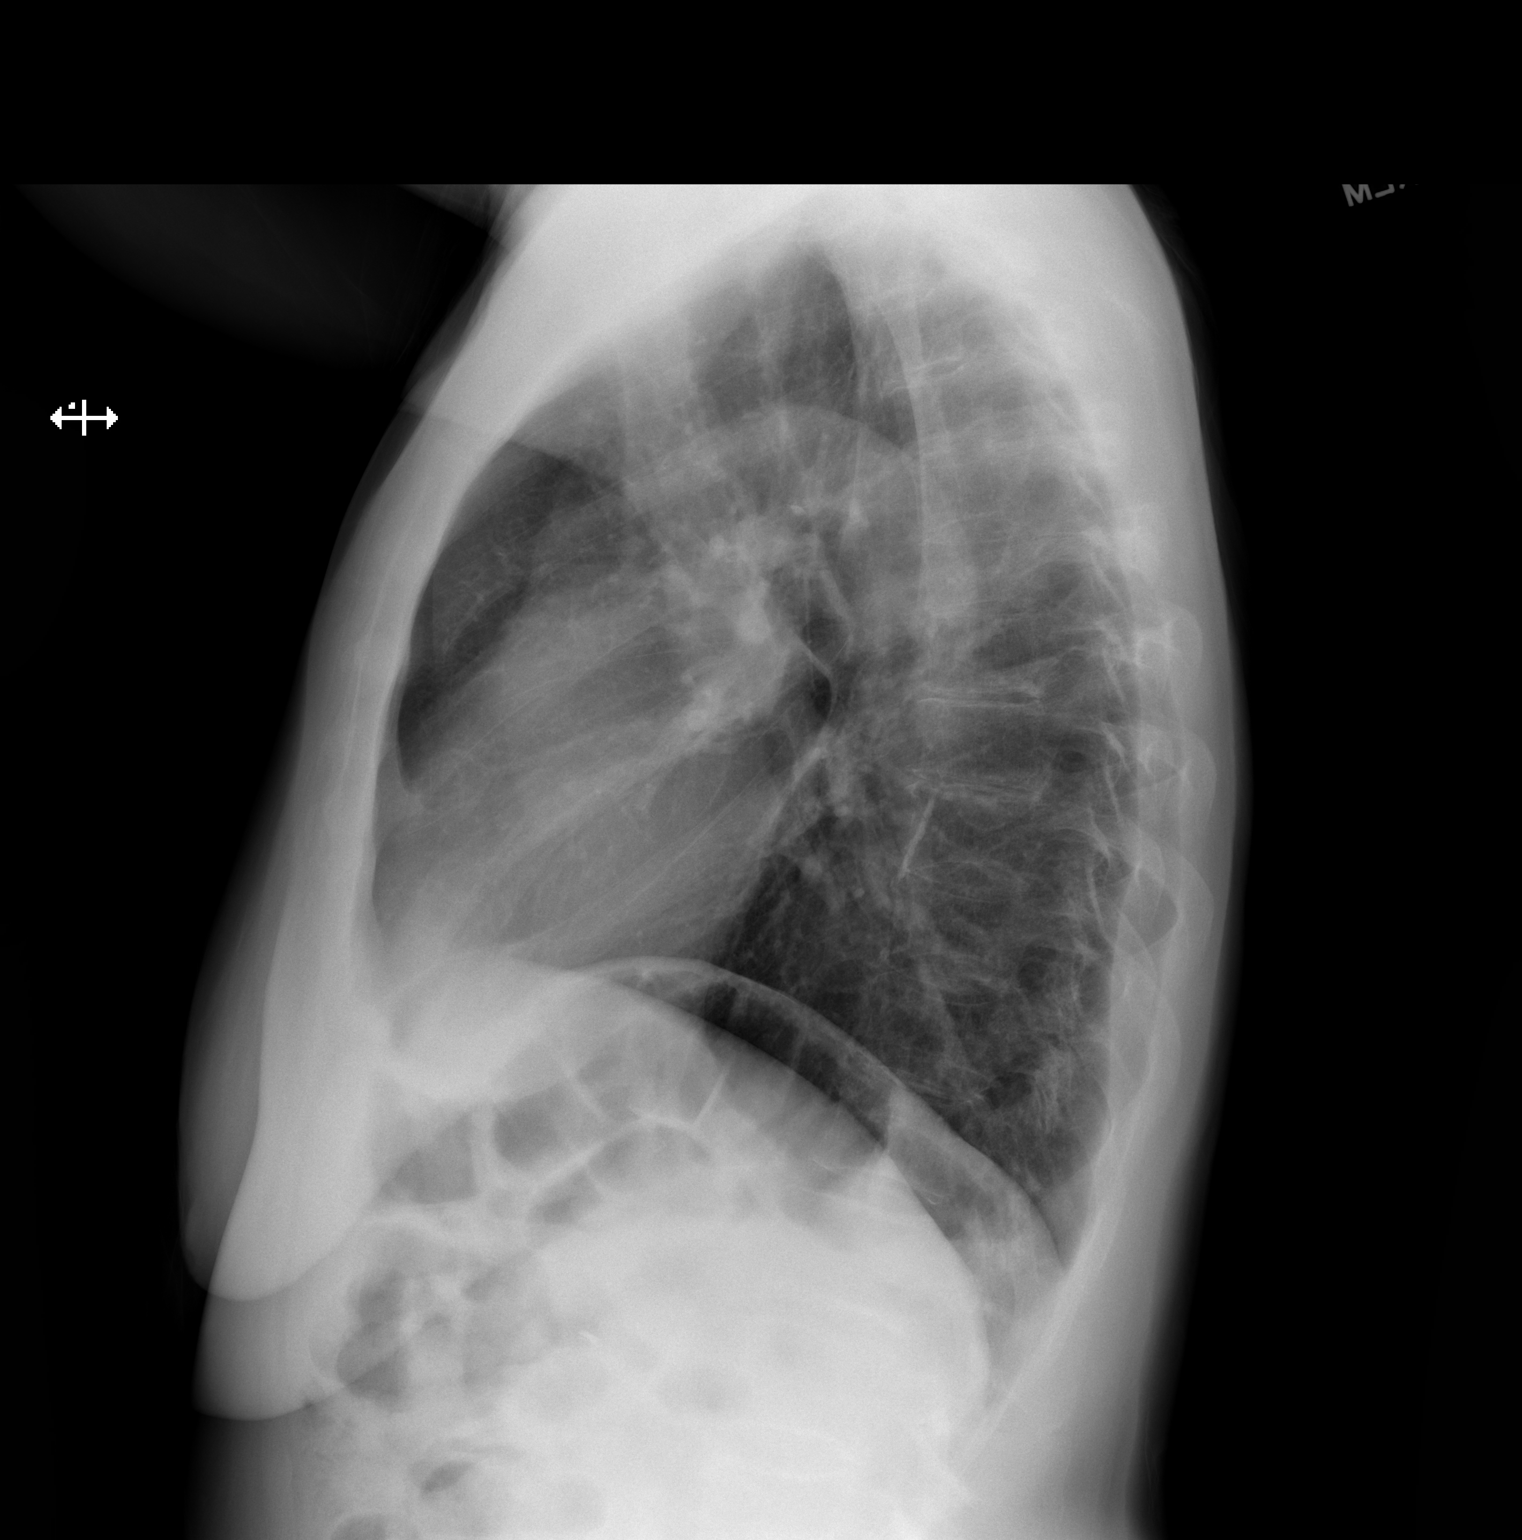

[2 of 2 positions shown; findings below may reference images not displayed]

FINDINGS: The heart size and mediastinal contours are within normal limits.
Both lungs are clear. The visualized skeletal structures are
unremarkable.
IMPRESSION: No active cardiopulmonary disease.

## 2021-07-14 ENCOUNTER — Ambulatory Visit: Payer: Federal, State, Local not specified - PPO | Admitting: Internal Medicine

## 2021-08-15 ENCOUNTER — Other Ambulatory Visit: Payer: Self-pay

## 2021-08-15 DIAGNOSIS — Z1211 Encounter for screening for malignant neoplasm of colon: Secondary | ICD-10-CM

## 2021-08-15 LAB — POC HEMOCCULT BLD/STL (HOME/3-CARD/SCREEN)
Card #2 Fecal Occult Blod, POC: NEGATIVE
Card #3 Fecal Occult Blood, POC: NEGATIVE
Fecal Occult Blood, POC: NEGATIVE

## 2021-08-29 ENCOUNTER — Ambulatory Visit: Payer: Federal, State, Local not specified - PPO | Admitting: Internal Medicine

## 2021-09-04 NOTE — Progress Notes (Signed)
FOLLOW UP  Assessment and Plan:  Aortic atherosclerosis (HCC) Control blood pressure, cholesterol, glucose, increase exercise.   CAD Dr. Percival Spanish following Control blood pressure, cholesterol, glucose, increase exercise.  Denies angina, dyspnea   Essential hypertension - continue medications, DASH diet, exercise and monitor at home. Call if greater than 130/80.  -     CBC with Differential/Platelet -     COMPLETE METABOLIC PANEL WITH GFR  Hypothyroidism, unspecified type -check TSH level, continue medications the same, reminded to take on an empty stomach 30-87mins before food.  -     TSH  Hyperlipidemia LDL goal <70 Continue atorvastatin for LDL goal <70 decrease fatty foods increase activity.  -     Lipid panel  Medication management -     Magnesium  Vitamin D deficiency At goal, continue supplement  Check q15m; defer today  Abnormal glucose Recent A1Cs at goal Discussed diet/exercise, weight management  Check A1C; check CMP    Continue diet and meds as discussed. Further disposition pending results of labs. Discussed med's effects and SE's.   Over 30 minutes of exam, counseling, chart review, and critical decision making was performed.   Future Appointments  Date Time Provider Dora  12/15/2021 10:00 AM [name], MD [facility] None    Plan:   During the course of the visit the patient was educated and counseled about appropriate screening and preventive services including:   Pneumococcal vaccine  Prevnar 13 Influenza vaccine Td vaccine Screening electrocardiogram Bone densitometry screening Colorectal cancer screening Diabetes screening Glaucoma screening Nutrition counseling  Advanced directives: requested  ----------------------------------------------------------------------------------------------------------------------  HPI 70 y.o. right handed female  presents for 3 month follow up on hypertension, cholesterol, glucose  management, weight (s/p roux-en-y) and vitamin D deficiency.   She reports 3 weeks of sharp pain in epigastric area. She takes Nexium for persistent GERD.  Also has persistent diarrhea stool chart 7- denies blood and mucus.  Denies nausea.  BMI is Body mass index is 26.54 kg/m., she has been working on diet and exercise, watching portions, cutting back on sweet tea and no more sugar in tea. She is active but not intentionally exercising. Hx of roux en Y.  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 She has aortic atherosclerosis per CT 01/2017. Recently in 01/2021 presented to ED with CP, had normal troponins and EKG, resolved with ASA and nitro x 2, she was advised to follow up with cardiology ASAP but reports she never did so as had just seen him prior to this event and was advised "all good." She denies CP episodes since.   Her blood pressure has been controlled at home 120-130/70's, today their BP is BP: 140/70 BP Readings from Last 3 Encounters:  09/07/21 140/70  07/05/21 (!) 142/80  05/05/21 (!) 142/82     She does workout. She denies chest pain, shortness of breath.    She is on cholesterol medication (atorvastatin 40 mg daily) and denies myalgias. Her cholesterol is at goal. The cholesterol last visit was:   Lab Results  Component Value Date   CHOL 132 05/05/2021   HDL 71 05/05/2021   LDLCALC 49 05/05/2021   TRIG 50 05/05/2021  CHOLHDL 1.9 05/05/2021    She has been working on diet and exercise for glucose management, and denies increased appetite, nausea, paresthesia of the feet, polydipsia, polyuria, visual disturbances, vomiting and weight loss. Last A1C in the office was:  Lab Results  Component  Value Date   HGBA1C 5.6 12/06/2020   She is on thyroid medication, not on biotin. Her medication was not changed last visit.   Lab Results  Component Value Date   TSH 2.90 05/05/2021   Patient is on Vitamin D supplement hasn't changed dose, reports taking 50000 IU every other day   Lab Results  Component Value Date   VD25OH 96 12/06/2020       Current Medications:  Current Outpatient Medications on File Prior to Visit  Medication Sig   Ascorbic Acid (VITAMIN C) 500 MG CAPS Take 1 tablet by mouth daily.    aspirin 81 MG tablet Take 81 mg by mouth daily.   atorvastatin (LIPITOR) 20 MG tablet Take 1 tablet Daily for Cholesterol   Calcium Citrate-Vitamin D (CALCIUM CITRATE + D3 PO) Take 600 mg by mouth daily.    diphenhydramine-acetaminophen (TYLENOL PM) 25-500 MG TABS tablet Take 1 tablet by mouth at bedtime as needed.   escitalopram (LEXAPRO) 5 MG tablet Take 1 tab daily for mood.   esomeprazole (NEXIUM) 40 MG capsule Take 1 capsule Daily for Heartburn & Indigestion   Ferrous Sulfate (SLOW RELEASE IRON PO) Take 1 tablet by mouth daily.    fish oil-omega-3 fatty acids 1000 MG capsule Take 1 g by mouth daily.   fluticasone (FLONASE) 50 MCG/ACT nasal spray Place 2 sprays into both nostrils daily as needed for allergies.   gabapentin (NEURONTIN) 100 MG capsule Take 1 capsule 4 x /day for Neuropathy pain   levothyroxine (EUTHYROX) 50 MCG tablet Take  1 tablet  Daily  on an empty stomach with only water for 30 minutes & no Antacid meds, Calcium or Magnesium for 4 hours & avoid Biotin   Magnesium 250 MG TABS Take 1 tablet by mouth daily.    Melatonin 10 MG TABS Take 1 tablet by mouth at bedtime.   meloxicam (MOBIC) 15 MG tablet Take 1 tablet (15 mg total) by mouth daily as needed for pain.   nitroGLYCERIN (NITROSTAT) 0.4 MG SL tablet Place 1 tablet (0.4 mg total) under the tongue every 5 (five) minutes as needed for chest pain.   olmesartan (BENICAR) 20 MG tablet Take 1 tablet Daily for BP    potassium chloride (K-DUR) 10 MEQ tablet Take 1 tablet 2 x /day   rOPINIRole (REQUIP) 3 MG tablet Take  1 tablet  at Bedtime  for Restless Legs   vitamin B-12 (CYANOCOBALAMIN) 1000 MCG tablet Take 1,000 mcg by mouth daily.    Vitamin D, Ergocalciferol, (DRISDOL) 1.25 MG (50000 UNIT) CAPS capsule Take 1 capsule twice weekly for Severe Vitamin D Deficiency   sucralfate (CARAFATE) 1 g tablet Take 1 tablet (1 g total) by mouth 4 (four) times daily. 30 min prior to meals and bedtime. Can dissolve in a few oz of water to help coat esophagus if needed.   [DISCONTINUED] olmesartan-hydrochlorothiazide (BENICAR HCT) 20-12.5 MG tablet Take 1 tablet Daily for BP   No current facility-administered medications on file prior to visit.     Allergies:  Allergies  Allergen Reactions   Codeine Other (See Comments)    "puts me out of it".  Dysphoria.  Patient tolerates Tramadol without complications.   Flagyl [Metronidazole] Hives  Nabumetone     GI upset     Medical History:  Past Medical History:  Diagnosis Date   Allergic rhinitis    Arthritis    Cholecystitis, chronic    Dysrhythmia    Hx bradycardia - hx normal cardiac cath x2 / ECHO 2009 EF 60% LAE MYOVIEW 3/12 EF 60% NO ISCHEMIA   Gastrointestinal ulcer due to Helicobacter pylori 34/19/6222   Hyperlipidemia    Hypertension    no treatment after 120 lb weight loss   Hypothyroidism    Prediabetes    Thyroid nodule    Family history- Reviewed and unchanged Social history- Reviewed and unchanged  MEDICARE WELLNESS OBJECTIVES: Physical activity: Current Exercise Habits: Home exercise routine, Type of exercise: walking, Time (Minutes): 30, Frequency (Times/Week): 4, Weekly Exercise (Minutes/Week): 120, Intensity: Mild, Exercise limited by: Other - see comments Cardiac risk factors: Cardiac Risk Factors include: advanced age (>11men, >41 women);dyslipidemia Depression/mood screen:   Depression screen [facility] 2/9 09/07/2021  Decreased  Interest 0  Down, Depressed, Hopeless 0  PHQ - 2 Score 0  Some recent data might be hidden    ADLs:  In your present state of health, do you have any difficulty performing the following activities: 09/07/2021  Hearing? N  Vision? N  Difficulty concentrating or making decisions? N  Walking or climbing stairs? N  Dressing or bathing? N  Doing errands, shopping? N  Some recent data might be hidden     Cognitive Testing  Alert? Yes  Normal Appearance?Yes  Oriented to person? Yes  Place? Yes   Time? Yes  Recall of three objects?  Yes  Can perform simple calculations? Yes  Displays appropriate judgment?Yes  Can read the correct time from a watch face?Yes  EOL planning: Does Patient Have a Medical Advance Directive?: No Would patient like information on creating a medical advance directive?: No - Patient declined    Review of Systems:  Review of Systems  Constitutional:  Negative for malaise/fatigue and weight loss.  HENT:  Positive for congestion and sinus pain. Negative for hearing loss, sore throat and tinnitus.   Eyes:  Negative for blurred vision and double vision.  Respiratory:  Negative for cough, sputum production, shortness of breath and wheezing.   Cardiovascular:  Negative for chest pain, palpitations, orthopnea, claudication and leg swelling.  Gastrointestinal:  Negative for abdominal pain, blood in stool, constipation, diarrhea, heartburn, melena, nausea and vomiting.  Genitourinary: Negative.   Musculoskeletal:  Negative for joint pain and myalgias.  Skin:  Negative for rash.  Neurological:  Positive for dizziness (episodic, without clear trigger, non-exertional). Negative for tingling, sensory change, weakness and headaches.  Endo/Heme/Allergies:  Negative for environmental allergies and polydipsia.  Psychiatric/Behavioral: Negative.  Negative for depression. The patient is not nervous/anxious and does not have insomnia.   All other systems reviewed and are  negative.  Physical Exam: BP 140/70   Pulse (!) 59   Temp 97.9 F (36.6 C)   Resp 17   Wt 154 lb 9.6 oz (70.1 kg)   LMP  (LMP Unknown)   SpO2 95%   BMI 26.54 kg/m  Wt Readings from Last 3 Encounters:  09/07/21 154 lb 9.6 oz (70.1 kg)  07/05/21 156 lb 12.8 oz (71.1 kg)  05/05/21 157 lb (71.2 kg)   General Appearance: Well nourished, in no apparent distress. Eyes: PERRLA, EOMs, conjunctiva no swelling or erythema Sinuses: No Frontal tenderness, generalized maxillary tenderness, puffy under eyes ENT/Mouth: Ext aud canals clear, TMs without erythema, bulging. No  erythema, swelling, or exudate on post pharynx.  Tonsils not swollen or erythematous. Hearing normal.  Neck: Supple, thyroid normal.  Respiratory: Respiratory effort normal, BS equal bilaterally without rales, rhonchi, wheezing or stridor.  Cardio: RRR with no MRGs. Brisk peripheral pulses without edema.  Abdomen: Soft, + BS.  Non-tender, no guarding, rebound, hernias, masses. Lymphatics: Non tender without lymphadenopathy.  Musculoskeletal: Full ROM, 5/5 strength, Normal gait.  Skin: Warm, dry, intact without rash, concerning lesions, ecchymosis.  Neuro: Cranial nerves intact. No cerebellar symptoms.  Psych: Awake and oriented X 3, normal affect, Insight and Judgment appropriate.     Medicare Attestation I have personally reviewed: The patient's medical and social history Their use of alcohol, tobacco or illicit drugs Their current medications and supplements The patient's functional ability including ADLs,fall risks, home safety risks, cognitive, and hearing and visual impairment Diet and physical activities Evidence for depression or mood disorders  The patient's weight, height, BMI, and visual acuity have been recorded in the chart.  I have made referrals, counseling, and provided education to the patient based on review of the above and I have provided the patient with a written personalized care plan for  preventive services.    Magda Bernheim, NP 10:58 AM [facility]

## 2021-09-07 ENCOUNTER — Other Ambulatory Visit: Payer: Self-pay

## 2021-09-07 ENCOUNTER — Encounter: Payer: Self-pay | Admitting: Nurse Practitioner

## 2021-09-07 ENCOUNTER — Ambulatory Visit: Payer: Federal, State, Local not specified - PPO | Admitting: Nurse Practitioner

## 2021-09-07 VITALS — BP 140/70 | HR 59 | Temp 97.9°F | Resp 17 | Wt 154.6 lb

## 2021-09-07 DIAGNOSIS — I10 Essential (primary) hypertension: Secondary | ICD-10-CM

## 2021-09-07 DIAGNOSIS — E559 Vitamin D deficiency, unspecified: Secondary | ICD-10-CM

## 2021-09-07 DIAGNOSIS — E039 Hypothyroidism, unspecified: Secondary | ICD-10-CM

## 2021-09-07 DIAGNOSIS — I700 Atherosclerosis of aorta: Secondary | ICD-10-CM | POA: Diagnosis not present

## 2021-09-07 DIAGNOSIS — R7309 Other abnormal glucose: Secondary | ICD-10-CM

## 2021-09-07 DIAGNOSIS — I2510 Atherosclerotic heart disease of native coronary artery without angina pectoris: Secondary | ICD-10-CM

## 2021-09-07 DIAGNOSIS — Z23 Encounter for immunization: Secondary | ICD-10-CM

## 2021-09-07 DIAGNOSIS — E785 Hyperlipidemia, unspecified: Secondary | ICD-10-CM

## 2021-09-07 DIAGNOSIS — Z79899 Other long term (current) drug therapy: Secondary | ICD-10-CM

## 2021-09-07 MED ORDER — SUCRALFATE 1 G PO TABS: 1.0000 g | ORAL_TABLET | Freq: Four times a day (QID) | ORAL | 1 refills | Status: DC

## 2021-09-07 NOTE — Patient Instructions (Signed)
Gastritis, Adult Gastritis is inflammation of the stomach. There are two kinds of gastritis: Acute gastritis. This kind develops suddenly. Chronic gastritis. This kind is much more common. It develops slowly and lasts for a long time. Gastritis happens when the lining of the stomach becomes weak or gets damaged. Without treatment, gastritis can lead to stomach bleeding and ulcers. What are the causes? This condition may be caused by: An infection. Drinking too much alcohol. Certain medicines. These include steroids, antibiotics, and some over-the-counter medicines, such as aspirin or ibuprofen. Having too much acid in the stomach. Having a disease of the stomach. Other causes may include: An allergic reaction. Some cancer treatments (radiation). Smoking cigarettes or the use of products that contain nicotine or tobacco. In some cases, the cause of this condition is not known. What increases the risk? Having a disease of the intestines. Having a disease in which the body's immune system attacks the body (autoimmune disease), such as Crohn's disease. Using aspirin or ibuprofen and other NSAIDs to treat other conditions, such as heart disease or chronic pain. Stress. What are the signs or symptoms? Symptoms of this condition include: Pain or a burning sensation in the upper abdomen. Nausea. Vomiting. An uncomfortable feeling of fullness after eating. Weight loss. Bad breath. Blood in your vomit or stool (feces). In some cases, there are no symptoms. How is this diagnosed? This condition may be diagnosed based on your medical history, a physical exam, and tests. Tests may include: Your medical history and a description of your symptoms. A physical exam. Tests. These can include: Blood tests. Stool tests. A test in which a thin, flexible instrument with a light and a camera is passed down the esophagus and into the stomach (upper endoscopy). A test in which a tissue sample is  removed to look at it under a microscope (biopsy). How is this treated? This condition may be treated with medicines. The medicines that are used vary depending on the cause of the gastritis. If the condition is caused by a bacterial infection, you may be given antibiotic medicines. If the condition is caused by too much acid in the stomach, you may be given medicines called H2 blockers, proton pump inhibitors, or antacids. Treatment may also involve stopping the use of certain medicines such as aspirin or ibuprofen and other NSAIDs. Follow these instructions at home: Medicines Take over-the-counter and prescription medicines only as told by your health care provider. If you were prescribed an antibiotic medicine, take it as told by your health care provider. Do not stop taking the antibiotic even if you start to feel better. Alcohol use Do not drink alcohol if: Your health care provider tells you not to drink. You are pregnant, may be pregnant, or are planning to become pregnant. If you drink alcohol: Limit your use to: 0-1 drink a day for women. 0-2 drinks a day for men. Know how much alcohol is in your drink. In the [place] one drink equals one 12 oz bottle of beer (355 mL), one 5 oz glass of wine (148 mL), or one 1 oz glass of hard liquor (44 mL). General instructions  Eat small, frequent meals instead of large meals. Avoid foods and drinks that make your symptoms worse. Talk with your health care provider about ways to manage stress, such as getting regular exercise or practicing deep breathing, meditation, or yoga. Do not use any products that contain nicotine or tobacco. These products include cigarettes, chewing tobacco, and vaping devices, such as e-cigarettes.  If you need help quitting, ask your health care provider. Drink enough fluid to keep your urine pale yellow. Keep all follow-up visits. This is important. Contact a health care provider if: Your symptoms get worse. Your  abdominal pain gets worse. Your symptoms return after treatment. You have a fever. Get help right away if: You vomit blood or a substance that looks like coffee grounds. You have black or dark red stools. You are unable to keep fluids down. These symptoms may represent a serious problem that is an emergency. Do not wait to see if the symptoms will go away. Get medical help right away. Call your local emergency services (911 in the [place] Do not drive yourself to the hospital. Summary Gastritis is inflammation of the lining of the stomach that can occur suddenly (acute) or develop slowly over time (chronic). This condition is diagnosed with a medical history, a physical exam, or tests. This condition may be treated with medicines to treat infection or medicines to reduce the amount of acid in your stomach. Follow your health care provider's instructions about taking medicines, making changes to your diet, and knowing when to call for help. This information is not intended to replace advice given to you by your health care provider. Make sure you discuss any questions you have with your health care provider. Document Revised: 02/18/2021 Document Reviewed: 02/18/2021 Elsevier Patient Education  [employer].

## 2021-09-09 LAB — CBC WITH DIFFERENTIAL/PLATELET
Absolute Monocytes: 384 cells/uL (ref 200–950)
Basophils Absolute: 101 cells/uL (ref 0–200)
Basophils Relative: 2.1 %
Eosinophils Absolute: 341 cells/uL (ref 15–500)
Eosinophils Relative: 7.1 %
HCT: 37.5 % (ref 35.0–45.0)
Hemoglobin: 12.1 g/dL (ref 11.7–15.5)
Lymphs Abs: 2198 cells/uL (ref 850–3900)
MCH: 30.6 pg (ref 27.0–33.0)
MCHC: 32.3 g/dL (ref 32.0–36.0)
MCV: 94.7 fL (ref 80.0–100.0)
MPV: 11.2 fL (ref 7.5–12.5)
Monocytes Relative: 8 %
Neutro Abs: 1776 cells/uL (ref 1500–7800)
Neutrophils Relative %: 37 %
Platelets: 238 10*3/uL (ref 140–400)
RBC: 3.96 10*6/uL (ref 3.80–5.10)
RDW: 12.3 % (ref 11.0–15.0)
Total Lymphocyte: 45.8 %
WBC: 4.8 10*3/uL (ref 3.8–10.8)

## 2021-09-09 LAB — COMPLETE METABOLIC PANEL WITH GFR
AG Ratio: 1.7 (calc) (ref 1.0–2.5)
ALT: 24 U/L (ref 6–29)
AST: 31 U/L (ref 10–35)
Albumin: 3.7 g/dL (ref 3.6–5.1)
Alkaline phosphatase (APISO): 92 U/L (ref 37–153)
BUN: 12 mg/dL (ref 7–25)
CO2: 29 mmol/L (ref 20–32)
Calcium: 8.8 mg/dL (ref 8.6–10.4)
Chloride: 105 mmol/L (ref 98–110)
Creat: 0.79 mg/dL (ref 0.50–1.05)
Globulin: 2.2 g/dL (calc) (ref 1.9–3.7)
Glucose, Bld: 84 mg/dL (ref 65–99)
Potassium: 4.5 mmol/L (ref 3.5–5.3)
Sodium: 139 mmol/L (ref 135–146)
Total Bilirubin: 1.6 mg/dL — ABNORMAL HIGH (ref 0.2–1.2)
Total Protein: 5.9 g/dL — ABNORMAL LOW (ref 6.1–8.1)
eGFR: 81 mL/min/{1.73_m2} (ref >=60)

## 2021-09-09 LAB — LIPID PANEL
Cholesterol: 116 mg/dL (ref <200)
HDL: 62 mg/dL (ref >=50)
LDL Cholesterol (Calc): 41 mg/dL (calc)
Non-HDL Cholesterol (Calc): 54 mg/dL (calc) (ref <130)
Total CHOL/HDL Ratio: 1.9 (calc) (ref <5.0)
Triglycerides: 52 mg/dL (ref <150)

## 2021-09-09 LAB — HEMOGLOBIN A1C
Hgb A1c MFr Bld: 5.3 % of total Hgb (ref <5.7)
Mean Plasma Glucose: 105 mg/dL
eAG (mmol/L): 5.8 mmol/L

## 2021-09-09 LAB — TSH: TSH: 1.64 mIU/L (ref 0.40–4.50)

## 2021-11-07 ENCOUNTER — Encounter: Payer: Federal, State, Local not specified - PPO | Admitting: Internal Medicine

## 2021-11-08 ENCOUNTER — Encounter: Payer: Self-pay | Admitting: Podiatry

## 2021-11-08 ENCOUNTER — Other Ambulatory Visit: Payer: Self-pay

## 2021-11-08 ENCOUNTER — Ambulatory Visit: Payer: Federal, State, Local not specified - PPO | Admitting: Podiatry

## 2021-11-08 DIAGNOSIS — L600 Ingrowing nail: Secondary | ICD-10-CM

## 2021-11-08 MED ORDER — NEOMYCIN-POLYMYXIN-HC 1 % OT SOLN: OTIC | 1 refills | Status: DC

## 2021-11-08 NOTE — Progress Notes (Signed)
Subjective:  Patient ID: Tracy Paul, female    DOB: 06/28/1951,  MRN: [id number] HPI Chief Complaint  Patient presents with   Toe Pain    Hallux left - medial border, tender x several months   New Patient (Initial Visit)    Est pt 2017    71 y.o. female presents with the above complaint.   ROS: Denies fever chills nausea vomiting muscle aches pains calf pain back pain chest pain shortness of breath.  Past Medical History:  Diagnosis Date   Allergic rhinitis    Arthritis    Cholecystitis, chronic    Dysrhythmia    Hx bradycardia - hx normal cardiac cath x2 / ECHO 2009 EF 60% LAE MYOVIEW 3/12 EF 60% NO ISCHEMIA   Gastrointestinal ulcer due to Helicobacter pylori 52/84/1324   Hyperlipidemia    Hypertension    no treatment after 120 lb weight loss   Hypothyroidism    Prediabetes    Thyroid nodule    Past Surgical History:  Procedure Laterality Date   Bilateral carpal tunnel surgery     BREATH TEK H PYLORI  07/01/2012   Procedure: BREATH TEK H PYLORI;  Surgeon: Edward Jolly, MD;  Location: [facility];  Service: General;  Laterality: N/A;   CHOLECYSTECTOMY N/A 11/27/2013   Procedure: LAPAROSCOPIC CHOLECYSTECTOMY WITH INTRAOPERATIVE CHOLANGIOGRAM;  Surgeon: Pedro Earls, MD;  Location: [facility];  Service: General;  Laterality: N/A;   GASTRIC ROUX-EN-Y N/A 12/16/2012   Procedure: LAPAROSCOPIC ROUX-EN-Y GASTRIC BYPASS WITH UPPER ENDOSCOPY;  Surgeon: Pedro Earls, MD;  Location: [facility];  Service: General;  Laterality: N/A;  Gastric Bypass   NASAL SINUS SURGERY     REPLACEMENT TOTAL KNEE BILATERAL     bilat   TUBAL LIGATION      Current Outpatient Medications:    NEOMYCIN-POLYMYXIN-HYDROCORTISONE (CORTISPORIN) 1 % SOLN OTIC solution, Apply 1-2 drops to toe BID after soaking, Disp: 10 mL, Rfl: 1   Ascorbic Acid (VITAMIN C) 500 MG CAPS, Take 1 tablet by mouth daily. , Disp: , Rfl:    aspirin 81 MG tablet, Take 81 mg by mouth daily., Disp: , Rfl:    atorvastatin  (LIPITOR) 20 MG tablet, Take 1 tablet Daily for Cholesterol, Disp: 90 tablet, Rfl: 1   Calcium Citrate-Vitamin D (CALCIUM CITRATE + D3 PO), Take 600 mg by mouth daily. , Disp: , Rfl:    diphenhydramine-acetaminophen (TYLENOL PM) 25-500 MG TABS tablet, Take 1 tablet by mouth at bedtime as needed., Disp: , Rfl:    escitalopram (LEXAPRO) 5 MG tablet, Take 1 tab daily for mood., Disp: 90 tablet, Rfl: 0   esomeprazole (NEXIUM) 40 MG capsule, Take 1 capsule Daily for Heartburn & Indigestion, Disp: 90 capsule, Rfl: 1   Ferrous Sulfate (SLOW RELEASE IRON PO), Take 1 tablet by mouth daily. , Disp: , Rfl:    fish oil-omega-3 fatty acids 1000 MG capsule, Take 1 g by mouth daily., Disp: , Rfl:    fluticasone (FLONASE) 50 MCG/ACT nasal spray, Place 2 sprays into both nostrils daily as needed for allergies., Disp: , Rfl:    gabapentin (NEURONTIN) 100 MG capsule, Take 1 capsule 4 x /day for Neuropathy pain, Disp: 360 capsule, Rfl: 3   levothyroxine (EUTHYROX) 50 MCG tablet, Take  1 tablet  Daily  on an empty stomach with only water for 30 minutes & no Antacid meds, Calcium or Magnesium for 4 hours & avoid Biotin, Disp: 90 tablet, Rfl: 1   Magnesium 250 MG TABS, Take  1 tablet by mouth daily. , Disp: , Rfl:    Melatonin 10 MG TABS, Take 1 tablet by mouth at bedtime., Disp: , Rfl:    meloxicam (MOBIC) 15 MG tablet, Take 1 tablet (15 mg total) by mouth daily as needed for pain., Disp: , Rfl:    nitroGLYCERIN (NITROSTAT) 0.4 MG SL tablet, Place 1 tablet (0.4 mg total) under the tongue every 5 (five) minutes as needed for chest pain., Disp: 30 tablet, Rfl: 0   olmesartan (BENICAR) 20 MG tablet, Take 1 tablet Daily for BP, Disp: 90 tablet, Rfl: 1   potassium chloride (K-DUR) 10 MEQ tablet, Take 1 tablet 2 x /day, Disp: 180 tablet, Rfl: 1   rOPINIRole (REQUIP) 3 MG tablet, Take  1 tablet  at Bedtime  for Restless Legs, Disp: 90 tablet, Rfl: 3   sucralfate (CARAFATE) 1 g tablet, Take 1 tablet (1 g total) by mouth 4 (four)  times daily., Disp: 120 tablet, Rfl: 1   vitamin B-12 (CYANOCOBALAMIN) 1000 MCG tablet, Take 1,000 mcg by mouth daily. , Disp: , Rfl:    Vitamin D, Ergocalciferol, (DRISDOL) 1.25 MG (50000 UNIT) CAPS capsule, Take 1 capsule twice weekly for Severe Vitamin D Deficiency, Disp: 24 capsule, Rfl: 3  Allergies  Allergen Reactions   Codeine Other (See Comments)    "puts me out of it".  Dysphoria.  Patient tolerates Tramadol without complications.   Flagyl [Metronidazole] Hives   Nabumetone     GI upset   Review of Systems Objective:  There were no vitals filed for this visit.  General: Well developed, nourished, in no acute distress, alert and oriented x3   Dermatological: Skin is warm, dry and supple bilateral. Nails x 10 are well maintained; remaining integument appears unremarkable at this time. There are no open sores, no preulcerative lesions, no rash or signs of infection present.  Toenail is thick sharply incurvated along the tibial border mild erythema no cellulitis drainage or odor no skin breakdown.  Vascular: Dorsalis Pedis artery and Posterior Tibial artery pedal pulses are 2/4 bilateral with immedate capillary fill time. Pedal hair growth present. No varicosities and no lower extremity edema present bilateral.   Neruologic: Grossly intact via light touch bilateral. Vibratory intact via tuning fork bilateral. Protective threshold with Semmes Wienstein monofilament intact to all pedal sites bilateral. Patellar and Achilles deep tendon reflexes 2+ bilateral. No Babinski or clonus noted bilateral.   Musculoskeletal: No gross boney pedal deformities bilateral. No pain, crepitus, or limitation noted with foot and ankle range of motion bilateral. Muscular strength 5/5 in all groups tested bilateral.  Gait: Unassisted, Nonantalgic.    Radiographs:  None taken  Assessment & Plan:   Assessment: Ingrown toenail tibial border hallux left  Plan: Chemical matrixectomy performed today  after local anesthetic was administered.  She tolerated procedure well.  She was given both oral and written home-going instruction for the care and soaking of the toe.  She also received a prescription for Cortisporin otic which was sent to the pharmacy she will apply twice daily after soaking.  Follow-up with her in 2 weeks.     Neita Landrigan T. [place], [place]

## 2021-11-08 NOTE — Patient Instructions (Signed)

## 2021-11-22 ENCOUNTER — Ambulatory Visit: Payer: Medicare Other | Admitting: Podiatry

## 2021-12-15 ENCOUNTER — Other Ambulatory Visit: Payer: Self-pay

## 2021-12-15 ENCOUNTER — Encounter: Payer: Self-pay | Admitting: Internal Medicine

## 2021-12-15 ENCOUNTER — Ambulatory Visit: Payer: Federal, State, Local not specified - PPO | Admitting: Internal Medicine

## 2021-12-15 VITALS — BP 120/70 | HR 85 | Temp 97.9°F | Resp 16 | Ht 62.75 in | Wt 144.0 lb

## 2021-12-15 DIAGNOSIS — Z131 Encounter for screening for diabetes mellitus: Secondary | ICD-10-CM

## 2021-12-15 DIAGNOSIS — E1169 Type 2 diabetes mellitus with other specified complication: Secondary | ICD-10-CM

## 2021-12-15 DIAGNOSIS — E559 Vitamin D deficiency, unspecified: Secondary | ICD-10-CM

## 2021-12-15 DIAGNOSIS — Z8249 Family history of ischemic heart disease and other diseases of the circulatory system: Secondary | ICD-10-CM | POA: Diagnosis not present

## 2021-12-15 DIAGNOSIS — Z130 Encounter for screening for diseases of the blood and blood-forming organs and certain disorders involving the immune mechanism: Secondary | ICD-10-CM | POA: Diagnosis not present

## 2021-12-15 DIAGNOSIS — I2510 Atherosclerotic heart disease of native coronary artery without angina pectoris: Secondary | ICD-10-CM

## 2021-12-15 DIAGNOSIS — Z13220 Encounter for screening for lipoid disorders: Secondary | ICD-10-CM

## 2021-12-15 DIAGNOSIS — Z1389 Encounter for screening for other disorder: Secondary | ICD-10-CM | POA: Diagnosis not present

## 2021-12-15 DIAGNOSIS — Z79899 Other long term (current) drug therapy: Secondary | ICD-10-CM

## 2021-12-15 DIAGNOSIS — I10 Essential (primary) hypertension: Secondary | ICD-10-CM

## 2021-12-15 DIAGNOSIS — E039 Hypothyroidism, unspecified: Secondary | ICD-10-CM

## 2021-12-15 DIAGNOSIS — Z0000 Encounter for general adult medical examination without abnormal findings: Secondary | ICD-10-CM | POA: Diagnosis not present

## 2021-12-15 DIAGNOSIS — Z136 Encounter for screening for cardiovascular disorders: Secondary | ICD-10-CM | POA: Diagnosis not present

## 2021-12-15 DIAGNOSIS — F17200 Nicotine dependence, unspecified, uncomplicated: Secondary | ICD-10-CM

## 2021-12-15 DIAGNOSIS — Z1212 Encounter for screening for malignant neoplasm of rectum: Secondary | ICD-10-CM

## 2021-12-15 DIAGNOSIS — Z1211 Encounter for screening for malignant neoplasm of colon: Secondary | ICD-10-CM

## 2021-12-15 DIAGNOSIS — D518 Other vitamin B12 deficiency anemias: Secondary | ICD-10-CM

## 2021-12-15 DIAGNOSIS — I700 Atherosclerosis of aorta: Secondary | ICD-10-CM

## 2021-12-15 DIAGNOSIS — N182 Chronic kidney disease, stage 2 (mild): Secondary | ICD-10-CM

## 2021-12-15 DIAGNOSIS — Z0001 Encounter for general adult medical examination with abnormal findings: Secondary | ICD-10-CM

## 2021-12-15 DIAGNOSIS — E1122 Type 2 diabetes mellitus with diabetic chronic kidney disease: Secondary | ICD-10-CM

## 2021-12-15 DIAGNOSIS — D508 Other iron deficiency anemias: Secondary | ICD-10-CM

## 2021-12-15 DIAGNOSIS — K9589 Other complications of other bariatric procedure: Secondary | ICD-10-CM

## 2021-12-15 NOTE — Progress Notes (Signed)
Annual Screening/Preventative Visit & Comprehensive Evaluation &  Examination  Future Appointments  Date Time Provider Department  12/15/2021 10:00 AM [name], MD [facility]  12/21/2022  9:00 AM [name], MD [facility]        This very nice 71 y.o.  MWF presents for a Screening /Preventative Visit & comprehensive evaluation and management of multiple medical co-morbidities.  Patient has been followed for HTN, HLD, T2_NIDDM  and Vitamin D Deficiency.   Abd CT scan in 2018 showed Aortic Atherosclerosis.        HTN predates circa 1995. Patient's BP has been controlled at home. Patient has had 2 Negative Heart Caths ( 1995 & 2000).  In 2019 , she was hospitalized with Chest pain & MI was ruled out. Cardiac CTA ruled out high risk CAD.  Patient denies any cardiac symptoms as chest pain, palpitations, shortness of breath, dizziness or ankle swelling. Today's BP is at goal -  120/70.        Patient's hyperlipidemia is controlled with diet and medications. Patient denies myalgias or other medication SE's. 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 Now her weight is down to 144 #  ( down 134 # ) .    Since her weight loss, she has remained off of Diabetic meds with normal A1c's.  Patient denies reactive hypoglycemic symptoms, visual blurring, diabetic polys or paresthesias. Last A1c was normal & at goal :  Lab Results  Component Value Date   HGBA1C 5.3 09/07/2021         Finally, patient has history of Vitamin D Deficiency  ("28" /2008) and last Vitamin D was at goal :  Lab Results  Component Value Date   VD25OH 96 12/06/2020     Current Outpatient Medications on File Prior to Visit   Medication Sig   Ascorbic Acid (VITAMIN C) 500 MG CAPS Take 1 tablet  daily.    aspirin 81 MG tablet Take daily.   atorvastatin (LIPITOR) 20 MG tablet Take 1 tablet Daily for Cholesterol   Calcium Citrat 600 mg -Vitamin D  Take daily.    TYLENOL PM 25-500 MG  Take 1 tablet at bedtime as needed.   escitalopram 5 MG tablet Take 1 tab daily for mood.   esomeprazole  40 MG capsule Take 1 capsule Daily for Heartburn & Indigestion   Ferrous Sulfate  Take 1 tablet daily.    fish oil-omega-3 1000 MG capsule  1 g   Take daily.   FLONASE  nasal spray Place 2 sprays into both nostrils daily as needed    gabapentin 100 MG capsule Take 1 capsule 4 x /day for Neuropathy pain   levothyroxine  50 MCG tablet Take  1 tablet  Daily     Magnesium 250 MG TABS Take 1 tablet daily.    Melatonin 10 MG TABS Take 1 tablet  at bedtime.   meloxicam (MOBIC) 15 MG tablet Take 1 tablet daily as needed for pain.   CORTISPORIN OTIC soln Apply 1-2 drops to toe BID after soaking   NITROSTAT 0.4 MG SL tablet Pas needed for chest pain.  olmesartan (BENICAR) 20 MG tablet Take 1 tablet Daily for BP   potassium chloride (K-DUR) 10 MEQ tablet Take 1 tablet 2 x /day   rOPINIRole (REQUIP) 3 MG tablet Take  1 tablet  at Bedtime  for Restless Legs   sucralfate (CARAFATE) 1 g tablet Take 1 tablet  4 times daily.   vitamin B-12 1000 MCG tablet Take daily.    Vitamin D 50,000 u Take 1 capsule twice weekly      Allergies  Allergen Reactions   Codeine Other (See Comments)    "puts me out of it".  Dysphoria.  Patient tolerates Tramadol without complications.   Flagyl [Metronidazole] Hives   Nabumetone     GI upset     Past Medical History:  Diagnosis Date   Allergic rhinitis    Arthritis    Cholecystitis, chronic    Dysrhythmia    Hx bradycardia - hx normal cardiac cath x2 / ECHO 2009 EF 60% LAE MYOVIEW 3/12 EF 60% NO ISCHEMIA   Gastrointestinal ulcer due to Helicobacter pylori 70/96/2836   Hyperlipidemia     Hypertension    no treatment after 120 lb weight loss   Hypothyroidism    Prediabetes    Thyroid nodule      Health Maintenance  Topic Date Due   Zoster Vaccines- Shingrix (1 of 2) Never done   COVID-19 Vaccine (3 - Moderna risk series) 02/24/2020   MAMMOGRAM  05/03/2023   COLONOSCOPY (Pts 45-52yrs Insurance coverage will need to be confirmed)  05/29/2023   TETANUS/TDAP  05/23/2026   Pneumonia Vaccine 47+ Years old  Completed   INFLUENZA VACCINE  Completed   DEXA SCAN  Completed   Hepatitis C Screening  Completed   HPV VACCINES  Aged Out     Immunization History  Administered Date(s) Administered   Influenza Split 08/25/2013, 09/02/2014, 08/10/2015   Influenza, High Dose Seasonal PF 07/31/2017, 08/20/2018, 07/23/2019, 08/23/2020, 09/07/2021   Influenza,inj,quad, With Preservative 08/10/2016   Moderna Sars-Covid-2 Vaccination 12/30/2019, 01/27/2020   PPD Test 02/23/2014, 05/23/2016   Pneumococcal Conjugate-13 07/31/2018   Pneumococcal Polysaccharide-23 10/18/2008, 10/08/2019   Td 08/08/2006   Tdap 05/23/2016   Zoster, Live 10/15/2012     Last Colon - 05/28/2018 - Dr Silverio Decamp - Recc f/u in 5 years - due Aug 2024   Last MGM - 05/02/2021   Past Surgical History:  Procedure Laterality Date   Bilateral carpal tunnel surgery     BREATH TEK H PYLORI  07/01/2012   Procedure: BREATH TEK H PYLORI;  Surgeon: Edward Jolly, MD;  Location: [facility];  Service: General;  Laterality: N/A;   CHOLECYSTECTOMY N/A 11/27/2013   Procedure: LAPAROSCOPIC CHOLECYSTECTOMY WITH INTRAOPERATIVE CHOLANGIOGRAM;  Surgeon: Pedro Earls, MD;  Location: [facility];  Service: General;  Laterality: N/A;   GASTRIC ROUX-EN-Y N/A 12/16/2012   Procedure: LAPAROSCOPIC ROUX-EN-Y GASTRIC BYPASS WITH UPPER ENDOSCOPY;  Surgeon: Pedro Earls, MD;  Location: [facility];  Service: General;  Laterality: N/A;  Gastric Bypass   NASAL SINUS SURGERY     REPLACEMENT TOTAL KNEE BILATERAL     bilat   TUBAL  LIGATION       Family History  Problem Relation Age of Onset   Breast cancer Mother    Cancer Mother        breast cancer   Anuerysm Mother        brain anuerysm   Heart attack Father 32   Heart attack Sister 53   Breast cancer Paternal 68  Breast cancer Cousin    Colon cancer Neg Hx    Stomach cancer Neg Hx    Rectal cancer Neg Hx    Esophageal cancer Neg Hx      Social History   Tobacco Use   Smoking status: Former    Packs/day: 0.80    Years: 10.00    Pack years: 8.00    Types: Cigarettes    Quit date: 10/30/1979    Years since quitting: 42.1   Smokeless tobacco: Never  Vaping Use   Vaping Use: Never used  Substance Use Topics   Alcohol use: No   Drug use: No      ROS Constitutional: Denies fever, chills, weight loss/gain, headaches, insomnia,  night sweats, and change in appetite. Does c/o fatigue. Eyes: Denies redness, blurred vision, diplopia, discharge, itchy, watery eyes.  ENT: Denies discharge, congestion, post nasal drip, epistaxis, sore throat, earache, hearing loss, dental pain, Tinnitus, Vertigo, Sinus pain, snoring.  Cardio: Denies chest pain, palpitations, irregular heartbeat, syncope, dyspnea, diaphoresis, orthopnea, PND, claudication, edema Respiratory: denies cough, dyspnea, DOE, pleurisy, hoarseness, laryngitis, wheezing.  Gastrointestinal: Denies dysphagia, heartburn, reflux, water brash, pain, cramps, nausea, vomiting, bloating, diarrhea, constipation, hematemesis, melena, hematochezia, jaundice, hemorrhoids Genitourinary: Denies dysuria, frequency, urgency, nocturia, hesitancy, discharge, hematuria, flank pain Breast: Breast lumps, nipple discharge, bleeding.  Musculoskeletal: Denies arthralgia, myalgia, stiffness, Jt. Swelling, pain, limp, and strain/sprain. Denies falls. Skin: Denies puritis, rash, hives, warts, acne, eczema, changing in skin lesion Neuro: No weakness, tremor, incoordination, spasms, paresthesia, pain Psychiatric: Denies  confusion, memory loss, sensory loss. Denies Depression. Endocrine: Denies change in weight, skin, hair change, nocturia, and paresthesia, diabetic polys, visual blurring, hyper / hypo glycemic episodes.  Heme/Lymph: No excessive bleeding, bruising, enlarged lymph nodes.  Physical Exam  BP 120/70    Pulse 85    Temp 97.9 F (36.6 C)    Resp 16    Ht 5' 2.75" (1.594 m)    Wt 144 lb (65.3 kg)    LMP  (LMP Unknown)    SpO2 97%    BMI 25.71 kg/m   General Appearance: Well nourished, well groomed and in no apparent distress.  Eyes: PERRLA, EOMs, conjunctiva no swelling or erythema, normal fundi and vessels. Sinuses: No frontal/maxillary tenderness ENT/Mouth: EACs patent / TMs  nl. Nares clear without erythema, swelling, mucoid exudates. Oral hygiene is good. No erythema, swelling, or exudate. Tongue normal, non-obstructing. Tonsils not swollen or erythematous. Hearing normal.  Neck: Supple, thyroid not palpable. No bruits, nodes or JVD. Respiratory: Respiratory effort normal.  BS equal and clear bilateral without rales, rhonci, wheezing or stridor. Cardio: Heart sounds are normal with regular rate and rhythm and no murmurs, rubs or gallops. Peripheral pulses are normal and equal bilaterally without edema. No aortic or femoral bruits. Chest: symmetric with normal excursions and percussion. Breasts: Symmetric, without lumps, nipple discharge, retractions, or fibrocystic changes.  Abdomen: Flat, soft with bowel sounds active. Nontender, no guarding, rebound, hernias, masses, or organomegaly.  Lymphatics: Non tender without lymphadenopathy.  Musculoskeletal: Full ROM all peripheral extremities, joint stability, 5/5 strength, and normal gait. Skin: Warm and dry without rashes, lesions, cyanosis, clubbing or  ecchymosis.  Neuro: Cranial nerves intact, reflexes equal bilaterally. Normal muscle tone, no cerebellar symptoms. Sensation intact.  Pysch: Alert and oriented X 3, normal affect, Insight and  Judgment appropriate.    Assessment and Plan  1. Annual Preventative Screening Examination   2. Essential hypertension  - EKG 12-Lead - Urinalysis, Routine w reflex microscopic - Microalbumin /  creatinine urine ratio - CBC with Differential/Platelet - COMPLETE METABOLIC PANEL WITH GFR - Magnesium - TSH  3. Hyperlipidemia associated with type 2 diabetes mellitus ([place])  - EKG 12-Lead - Lipid panel - TSH  4. Type 2 diabetes mellitus with stage 2 chronic kidney  disease, without long-term current use of insulin ([facility])  - EKG 12-Lead - Urinalysis, Routine w reflex microscopic - Microalbumin / creatinine urine ratio - HM DIABETES FOOT EXAM - PR LOW EXTEMITY NEUR EXAM DOCUM - Hemoglobin A1c - Insulin, random  5. Vitamin D deficiency  - VITAMIN D 25 Hydroxy  6. Hypothyroidism, unspecified type  - TSH  7. Aortic atherosclerosis ([place]) by Abd CT scan on 08/01/2017  - EKG 12-Lead - Lipid panel  8. Iron deficiency anemia following bariatric surgery  - Iron, Total/Total Iron Binding Cap  9. 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 Coronary artery disease involving native coronary  artery of native heart without angina pectoris  - EKG 12-Lead - Lipid panel  11. Screening for colorectal cancer  - POC Hemoccult Bld/Stl   12. Screening for ischemic heart disease  - EKG 12-Lead  13. FHx: heart disease  - EKG 12-Lead  14. Smoker  - EKG 12-Lead  15. Medication management  - Urinalysis, Routine w reflex microscopic - Microalbumin / creatinine urine ratio - Iron, Total/Total Iron Binding Cap - Vitamin B12 - CBC with Differential/Platelet - COMPLETE METABOLIC PANEL WITH GFR - Magnesium - Lipid panel - TSH - Hemoglobin A1c - Insulin, random - VITAMIN D 25 Hydroxy          Patient was counseled in prudent diet to achieve/maintain BMI less than 25 for weight control, BP monitoring, regular exercise and medications. Discussed  med's effects and SE's. Screening labs and tests as requested with regular follow-up as recommended. Over 40 minutes of exam, counseling, chart review and high complex critical decision making was performed.   Kirtland Bouchard, MD

## 2021-12-15 NOTE — Patient Instructions (Signed)

## 2021-12-16 ENCOUNTER — Other Ambulatory Visit: Payer: Self-pay | Admitting: Internal Medicine

## 2021-12-16 DIAGNOSIS — N179 Acute kidney failure, unspecified: Secondary | ICD-10-CM

## 2021-12-16 NOTE — Progress Notes (Signed)
=============================================================== °=============================================================== °-   Test results slightly outside the reference range are not unusual. If there is anything important, I will review this with you,  otherwise it is considered normal test values.  If you have further questions,  please do not hesitate to contact me at the office or via My Chart.  =============================================================== ===============================================================  -  Kidney functions look Very Dehydrated   Kidney Flow Rate  (GFR) has dropped from 81 down to 49           -> that's a 37 % decrease in Kidney function  !   <><><><><><><><><><><><><><><><><><><><><><><><><><><><><><><><> <><><><><><><><><><><><><><><><><><><><><><><><><><><><><><><><>  - STOP MELOXICAM which can damage Kidney Functions  !  <><><><><><><><><><><><><><><><><><><><><><><><><><><><><><><><> <><><><><><><><><><><><><><><><><><><><><><><><><><><><><><><><>   Very important to drink adequate amounts of fluids to prevent permanent damage    - Recommend drink at least 6 bottles (16 ounces) of fluids /water /day = 96 Oz ~100 oz  - 100 oz = 3,000 cc or 3 liters / day  - >> That's 1 &1/2 bottles of a 2 liter soda bottle /day !   - Recommend call office to schedule a NV to recheck Kidney functions in 10-14 days  =============================================================== ===============================================================  -  Iron level is Normal  =============================================================== ===============================================================  -  Vitamin B12   =      Very Low  (Ideal or Goal Vit B12 is between    450 - 1,100    )   Low Vit B12 may be associated with Anemia , Fatigue,   Peripheral Neuropathy, Dementia, "Brain Fog", Psychosis & Depression  - Recommend take a sub-lingual form of  Vitamin B12 tablet   1,000 to 5,000 mcg tab that you dissolve under your tongue /Daily   - Can get Baron Sane - best price at [employer] or on [employer] =============================================================== ===============================================================  -  A1c - Normal - No Diabetes - Great ! =============================================================== ===============================================================  -  Vitamin D = 97  - Excellent  ! - Please keep dose same  =============================================================== ===============================================================  -  Total Chol = 136    &    LDL Chol = 41   - Both  Excellent   - Very low risk for Heart Attack  /Stroke - Please continue Atorvastatin Same  ============================================================ ============================================================  -  All Else - CBC  - Electrolytes - Liver - Magnesium & Thyroid    - all  Normal / OK =============================================================== ===============================================================

## 2021-12-18 LAB — CBC WITH DIFFERENTIAL/PLATELET
Absolute Monocytes: 561 cells/uL (ref 200–950)
Basophils Absolute: 73 cells/uL (ref 0–200)
Basophils Relative: 1.1 %
Eosinophils Absolute: 370 cells/uL (ref 15–500)
Eosinophils Relative: 5.6 %
HCT: 39.4 % (ref 35.0–45.0)
Hemoglobin: 13 g/dL (ref 11.7–15.5)
Lymphs Abs: 2567 cells/uL (ref 850–3900)
MCH: 30.7 pg (ref 27.0–33.0)
MCHC: 33 g/dL (ref 32.0–36.0)
MCV: 93.1 fL (ref 80.0–100.0)
MPV: 11.3 fL (ref 7.5–12.5)
Monocytes Relative: 8.5 %
Neutro Abs: 3029 cells/uL (ref 1500–7800)
Neutrophils Relative %: 45.9 %
Platelets: 272 10*3/uL (ref 140–400)
RBC: 4.23 10*6/uL (ref 3.80–5.10)
RDW: 12.9 % (ref 11.0–15.0)
Total Lymphocyte: 38.9 %
WBC: 6.6 10*3/uL (ref 3.8–10.8)

## 2021-12-18 LAB — URINALYSIS, ROUTINE W REFLEX MICROSCOPIC
Bilirubin Urine: NEGATIVE
Glucose, UA: NEGATIVE
Hgb urine dipstick: NEGATIVE
Ketones, ur: NEGATIVE
Leukocytes,Ua: NEGATIVE
Nitrite: NEGATIVE
Protein, ur: NEGATIVE
Specific Gravity, Urine: 1.016 (ref 1.001–1.035)
pH: <=5 (ref 5.0–8.0)

## 2021-12-18 LAB — VITAMIN B12: Vitamin B-12: 318 pg/mL (ref 200–1100)

## 2021-12-18 LAB — IRON, TOTAL/TOTAL IRON BINDING CAP
%SAT: 20 % (calc) (ref 16–45)
Iron: 74 ug/dL (ref 45–160)
TIBC: 366 mcg/dL (calc) (ref 250–450)

## 2021-12-18 LAB — COMPLETE METABOLIC PANEL WITH GFR
AG Ratio: 1.8 (calc) (ref 1.0–2.5)
ALT: 22 U/L (ref 6–29)
AST: 23 U/L (ref 10–35)
Albumin: 4.4 g/dL (ref 3.6–5.1)
Alkaline phosphatase (APISO): 99 U/L (ref 37–153)
BUN/Creatinine Ratio: 19 (calc) (ref 6–22)
BUN: 23 mg/dL (ref 7–25)
CO2: 28 mmol/L (ref 20–32)
Calcium: 9.6 mg/dL (ref 8.6–10.4)
Chloride: 104 mmol/L (ref 98–110)
Creat: 1.2 mg/dL — ABNORMAL HIGH (ref 0.50–1.05)
Globulin: 2.5 g/dL (calc) (ref 1.9–3.7)
Glucose, Bld: 85 mg/dL (ref 65–99)
Potassium: 4.8 mmol/L (ref 3.5–5.3)
Sodium: 142 mmol/L (ref 135–146)
Total Bilirubin: 2 mg/dL — ABNORMAL HIGH (ref 0.2–1.2)
Total Protein: 6.9 g/dL (ref 6.1–8.1)
eGFR: 49 mL/min/{1.73_m2} — ABNORMAL LOW (ref >=60)

## 2021-12-18 LAB — LIPID PANEL
Cholesterol: 136 mg/dL (ref <200)
HDL: 76 mg/dL (ref >=50)
LDL Cholesterol (Calc): 47 mg/dL (calc)
Non-HDL Cholesterol (Calc): 60 mg/dL (calc) (ref <130)
Total CHOL/HDL Ratio: 1.8 (calc) (ref <5.0)
Triglycerides: 58 mg/dL (ref <150)

## 2021-12-18 LAB — INSULIN, RANDOM: Insulin: 3.5 u[IU]/mL

## 2021-12-18 LAB — MICROALBUMIN / CREATININE URINE RATIO
Creatinine, Urine: 111 mg/dL (ref 20–275)
Microalb Creat Ratio: 7 mcg/mg creat (ref <30)
Microalb, Ur: 0.8 mg/dL

## 2021-12-18 LAB — HEMOGLOBIN A1C
Hgb A1c MFr Bld: 5.5 % of total Hgb (ref <5.7)
Mean Plasma Glucose: 111 mg/dL
eAG (mmol/L): 6.2 mmol/L

## 2021-12-18 LAB — VITAMIN D 25 HYDROXY (VIT D DEFICIENCY, FRACTURES): Vit D, 25-Hydroxy: 97 ng/mL (ref 30–100)

## 2021-12-18 LAB — TSH: TSH: 1.65 mIU/L (ref 0.40–4.50)

## 2021-12-18 LAB — MAGNESIUM: Magnesium: 2.1 mg/dL (ref 1.5–2.5)

## 2021-12-24 ENCOUNTER — Other Ambulatory Visit: Payer: Self-pay | Admitting: Adult Health

## 2021-12-24 DIAGNOSIS — E785 Hyperlipidemia, unspecified: Secondary | ICD-10-CM

## 2021-12-25 ENCOUNTER — Other Ambulatory Visit: Payer: Self-pay | Admitting: Internal Medicine

## 2021-12-25 DIAGNOSIS — J324 Chronic pansinusitis: Secondary | ICD-10-CM

## 2021-12-28 ENCOUNTER — Other Ambulatory Visit: Payer: Self-pay | Admitting: Internal Medicine

## 2021-12-28 DIAGNOSIS — I10 Essential (primary) hypertension: Secondary | ICD-10-CM

## 2022-01-03 LAB — HM DIABETES EYE EXAM

## 2022-01-16 ENCOUNTER — Other Ambulatory Visit: Payer: Self-pay | Admitting: Internal Medicine

## 2022-01-16 DIAGNOSIS — N179 Acute kidney failure, unspecified: Secondary | ICD-10-CM

## 2022-01-17 ENCOUNTER — Ambulatory Visit (INDEPENDENT_AMBULATORY_CARE_PROVIDER_SITE_OTHER): Payer: Federal, State, Local not specified - PPO

## 2022-01-17 ENCOUNTER — Other Ambulatory Visit: Payer: Self-pay

## 2022-01-17 DIAGNOSIS — N179 Acute kidney failure, unspecified: Secondary | ICD-10-CM | POA: Diagnosis not present

## 2022-01-17 NOTE — Progress Notes (Signed)
Patient returns today for repeat CMP to check her kidney functions. Patient reports taking no NSAIDS and drinking 3-4 glasses of water around 8 ounces each glass. She voices no new complaints today. ?

## 2022-01-18 LAB — COMPLETE METABOLIC PANEL WITH GFR
AG Ratio: 1.6 (calc) (ref 1.0–2.5)
ALT: 17 U/L (ref 6–29)
AST: 20 U/L (ref 10–35)
Albumin: 3.7 g/dL (ref 3.6–5.1)
Alkaline phosphatase (APISO): 94 U/L (ref 37–153)
BUN: 15 mg/dL (ref 7–25)
CO2: 23 mmol/L (ref 20–32)
Calcium: 8.6 mg/dL (ref 8.6–10.4)
Chloride: 110 mmol/L (ref 98–110)
Creat: 0.97 mg/dL (ref 0.50–1.05)
Globulin: 2.3 g/dL (calc) (ref 1.9–3.7)
Glucose, Bld: 82 mg/dL (ref 65–99)
Potassium: 4.3 mmol/L (ref 3.5–5.3)
Sodium: 142 mmol/L (ref 135–146)
Total Bilirubin: 1.4 mg/dL — ABNORMAL HIGH (ref 0.2–1.2)
Total Protein: 6 g/dL — ABNORMAL LOW (ref 6.1–8.1)
eGFR: 63 mL/min/{1.73_m2} (ref >=60)

## 2022-01-18 NOTE — Progress Notes (Signed)
<><><><><><><><><><><><><><><><><><><><><><><><><><><><><><><><><> ?<><><><><><><><><><><><><><><><><><><><><><><><><><><><><><><><><> ?-   Test results slightly outside the reference range are not unusual. ?If there is anything important, I will review this with you,  ?otherwise it is considered normal test values.  ?If you have further questions,  ?please do not hesitate to contact me at the office or via My Chart.  ?<><><><><><><><><><><><><><><><><><><><><><><><><><><><><><><><><> ?<><><><><><><><><><><><><><><><><><><><><><><><><><><><><><><><><> ? ?-  Kidney functions much better - GFR Kidney filtering flow rate up from 48 to 63, but  ? ?not all the way back up yo 23  !  - So please keep drinking lots of fluids  ! ?<><><><><><><><><><><><><><><><><><><><><><><><><><><><><><><><><> ?<><><><><><><><><><><><><><><><><><><><><><><><><><><><><><><><><> ? ? ? ? ? ? ? ? ? ? ? ? ? ? ? ? ? ? ? ? ? ?

## 2022-01-30 ENCOUNTER — Encounter: Payer: Self-pay | Admitting: Internal Medicine

## 2022-03-16 NOTE — Progress Notes (Signed)
FOLLOW UP  Assessment and Plan:  Aortic atherosclerosis (HCC) Control blood pressure, cholesterol, glucose, increase exercise.   CAD Dr. Percival Spanish following Control blood pressure, cholesterol, glucose, increase exercise.  Denies angina, dyspnea  Nitroglycerin refilled  Essential hypertension - continue medications, DASH diet, exercise and monitor at home. Call if greater than 130/80.  -     CBC with Differential/Platelet -     COMPLETE METABOLIC PANEL WITH GFR  Hypothyroidism, unspecified type -check TSH level, continue medications the same, reminded to take on an empty stomach 30-29mns before food.  -     TSH  Hyperlipidemia LDL goal <70 Continue atorvastatin for LDL goal <70 decrease fatty foods increase activity.  -     Lipid panel   Maxillary sinusitis Continue Mucinex, allegra and flonase Zithromax and dexamethasone taper as directed Keep the appointment with Dr. TBenjamine Molain July  Medication management Continued  Vitamin D deficiency At goal, continue supplement  Check [id number] today  Abnormal glucose Recent A1Cs at goal Discussed diet/exercise, weight management  Defer A1C; check CMP    Continue diet and meds as discussed. Further disposition pending results of labs. Discussed med's effects and SE's.   Over 30 minutes of exam, counseling, chart review, and critical decision making was performed.   Future Appointments  Date Time Provider DeVolcano5/22/2023 10:30 AM MuMagda BernheimNP [facility] None  12/21/2022  9:00 AM McUnk PintoMD [facility] None     ----------------------------------------------------------------------------------------------------------------------  HPI Tracy Paul. right handed female  presents for 3 month follow up on hypertension, cholesterol, glucose management, weight (s/p roux-en-y) and vitamin D deficiency.   She reports that for several weeks she has been having sinus pain associated with green nasal mucus,  bad taste in her mouth.  She does see ENT Dr. TeBenjamine Molan 05/07/22. Denies cough and fever. Emesis x 2 due to bad taste from sinuses.   BMI is Body mass index is 26.32 kg/m., she has been working on diet and exercise, watching portions, cutting back on tea drinking more water. She is active but not intentionally exercising. Hx of roux en Y.  Wt Readings from Last 3 Encounters:  03/19/22 147 lb 6.4 oz (66.9 kg)  01/17/22 155 lb 3.2 oz (70.4 kg)  12/15/21 144 lb (65.3 kg)   She is followed by Dr. 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 She has aortic atherosclerosis per CT 01/2017. Recently in 01/2021 presented to ED with CP, had normal troponins and EKG, resolved with ASA and nitro x 2, she was advised to follow up with cardiology ASAP but reports she never did so as had just seen him prior to this event and was advised "all good." She denies CP episodes since.   Her blood pressure has been controlled at home 120-130/70's, today their BP is BP: (!) 142/80 BP Readings from Last 3 Encounters:  03/19/22 (!) 142/80  01/17/22 128/78  12/15/21 120/70     She does workout. She denies chest pain, shortness of breath.    She is on cholesterol medication (atorvastatin 40 mg daily) and denies myalgias. Her cholesterol is at goal. The cholesterol last visit was:   Lab Results  Component Value Date   CHOL 136 12/15/2021   HDL 76 12/15/2021   LDLCALC 47 12/15/2021   TRIG 58 12/15/2021   CHOLHDL 1.8 12/15/2021    She has  been working on diet and exercise for glucose management, and denies increased appetite, nausea, paresthesia of the feet, polydipsia, polyuria, visual disturbances, vomiting and weight loss. Last A1C in the office was:  Lab Results  Component Value Date   HGBA1C 5.5 12/15/2021   She is on thyroid medication, not on biotin. Her medication was not changed last visit.    Lab Results  Component Value Date   TSH 1.65 12/15/2021   Patient is on Vitamin D supplement hasn't changed dose, reports taking 50000 IU every other day   Lab Results  Component Value Date   VD25OH 97 12/15/2021       Current Medications:  Current Outpatient Medications on File Prior to Visit  Medication Sig   Ascorbic Acid (VITAMIN C) 500 MG CAPS Take 1 tablet by mouth daily.    aspirin 81 MG tablet Take 81 mg by mouth daily.   atorvastatin (LIPITOR) 20 MG tablet TAKE 1 TABLET BY MOUTH ONCE DAILY FOR CHOLESTEROL   Calcium Citrate-Vitamin D (CALCIUM CITRATE + D3 PO) Take 600 mg by mouth daily.    diphenhydramine-acetaminophen (TYLENOL PM) 25-500 MG TABS tablet Take 1 tablet by mouth at bedtime as needed.   escitalopram (LEXAPRO) 5 MG tablet Take 1 tab daily for mood.   esomeprazole (NEXIUM) 40 MG capsule Take 1 capsule Daily for Heartburn & Indigestion   Ferrous Sulfate (SLOW RELEASE IRON PO) Take 1 tablet by mouth daily.    fish oil-omega-3 fatty acids 1000 MG capsule Take 1 g by mouth daily.   fluticasone (FLONASE) 50 MCG/ACT nasal spray Place 2 sprays into both nostrils daily as needed for allergies.   gabapentin (NEURONTIN) 100 MG capsule Take 1 capsule 4 x /day for Neuropathy pain   levothyroxine (EUTHYROX) 50 MCG tablet Take  1 tablet  Daily  on an empty stomach with only water for 30 minutes & no Antacid meds, Calcium or Magnesium for 4 hours & avoid Biotin   Magnesium 250 MG TABS Take 1 tablet by mouth daily.    Melatonin 10 MG TABS Take 1 tablet by mouth at bedtime.   olmesartan (BENICAR) 20 MG tablet Take 1 tablet by mouth once daily for blood pressure   potassium chloride (K-DUR) 10 MEQ tablet Take 1 tablet 2 x /day   rOPINIRole (REQUIP) 3 MG tablet Take  1 tablet  at Bedtime  for Restless Legs   sucralfate (CARAFATE) 1 g tablet Take 1 tablet (1 g total) by mouth 4 (four) times daily.   vitamin B-12 (CYANOCOBALAMIN) 1000 MCG tablet Take 1,000 mcg by mouth daily.     Vitamin D, Ergocalciferol, (DRISDOL) 1.25 MG (50000 UNIT) CAPS capsule Take 1 capsule twice weekly for Severe Vitamin D Deficiency   [DISCONTINUED] olmesartan-hydrochlorothiazide (BENICAR HCT) 20-12.5 MG tablet Take 1 tablet Daily for BP   No current facility-administered medications on file prior to visit.     Allergies:  Allergies  Allergen Reactions   Codeine Other (See Comments)    "puts me out of it".  Dysphoria.  Patient tolerates Tramadol without complications.   Flagyl [Metronidazole] Hives   Nabumetone     GI upset     Medical History:  Past Medical History:  Diagnosis Date   Allergic rhinitis    Arthritis    Cholecystitis, chronic    Dysrhythmia    Hx bradycardia - hx normal cardiac cath x2 / ECHO 2009 EF 60% LAE MYOVIEW 3/12 EF 60% NO ISCHEMIA   Gastrointestinal ulcer due to Helicobacter pylori  10/14/2017   Hyperlipidemia    Hypertension    no treatment after 120 lb weight loss   Hypothyroidism    Prediabetes    Thyroid nodule    Family history- Reviewed and unchanged Social history- Reviewed and unchanged    Review of Systems:  Review of Systems  Constitutional:  Negative for malaise/fatigue and weight loss.  HENT:  Positive for congestion and sinus pain. Negative for hearing loss, sore throat and tinnitus.   Eyes:  Negative for blurred vision and double vision.  Respiratory:  Negative for cough, sputum production, shortness of breath and wheezing.   Cardiovascular:  Negative for chest pain, palpitations, orthopnea, claudication and leg swelling.  Gastrointestinal:  Negative for abdominal pain, blood in stool, constipation, diarrhea, heartburn, melena, nausea and vomiting.  Genitourinary: Negative.   Musculoskeletal:  Negative for joint pain and myalgias.  Skin:  Negative for rash.  Neurological:  Negative for dizziness, tingling, sensory change, weakness and headaches.  Endo/Heme/Allergies:  Negative for environmental allergies and polydipsia.   Psychiatric/Behavioral: Negative.  Negative for depression. The patient is not nervous/anxious and does not have insomnia.   All other systems reviewed and are negative.  Physical Exam: BP (!) 142/80   Pulse (!) 53   Temp 97.7 F (36.5 C)   Wt 147 lb 6.4 oz (66.9 kg)   LMP  (LMP Unknown)   SpO2 98%   BMI 26.32 kg/m  Wt Readings from Last 3 Encounters:  03/19/22 147 lb 6.4 oz (66.9 kg)  01/17/22 155 lb 3.2 oz (70.4 kg)  12/15/21 144 lb (65.3 kg)   General Appearance: Well nourished, in no apparent distress. Eyes: PERRLA, EOMs, conjunctiva no swelling or erythema Sinuses:  Generalized maxillary tenderness, puffy under eyes ENT/Mouth: Ext aud canals clear, TMs without erythema, bulging. No erythema, swelling, or exudate on post pharynx.  Tonsils not swollen or erythematous. Hearing normal.  Neck: Supple, thyroid normal.  Respiratory: Respiratory effort normal, BS equal bilaterally without rales, rhonchi, wheezing or stridor.  Cardio: RRR with no MRGs. Brisk peripheral pulses without edema.  Abdomen: Soft, + BS.  Non-tender, no guarding, rebound, hernias, masses. Lymphatics: Positive submandibular adenopathy bilaterally Musculoskeletal: Full ROM, 5/5 strength, Normal gait.  Skin: Warm, dry, intact without rash, concerning lesions, ecchymosis.  Neuro: Cranial nerves intact. No cerebellar symptoms.  Psych: Awake and oriented X 3, normal affect, Insight and Judgment appropriate.     Magda Bernheim, NP 9:21 AM [facility]

## 2022-03-19 ENCOUNTER — Ambulatory Visit: Payer: Federal, State, Local not specified - PPO | Admitting: Nurse Practitioner

## 2022-03-19 ENCOUNTER — Encounter: Payer: Self-pay | Admitting: Nurse Practitioner

## 2022-03-19 VITALS — BP 142/80 | HR 53 | Temp 97.7°F | Wt 147.4 lb

## 2022-03-19 DIAGNOSIS — J0100 Acute maxillary sinusitis, unspecified: Secondary | ICD-10-CM

## 2022-03-19 DIAGNOSIS — I700 Atherosclerosis of aorta: Secondary | ICD-10-CM | POA: Diagnosis not present

## 2022-03-19 DIAGNOSIS — I10 Essential (primary) hypertension: Secondary | ICD-10-CM | POA: Diagnosis not present

## 2022-03-19 DIAGNOSIS — E039 Hypothyroidism, unspecified: Secondary | ICD-10-CM | POA: Diagnosis not present

## 2022-03-19 DIAGNOSIS — R7309 Other abnormal glucose: Secondary | ICD-10-CM

## 2022-03-19 DIAGNOSIS — Z79899 Other long term (current) drug therapy: Secondary | ICD-10-CM | POA: Diagnosis not present

## 2022-03-19 DIAGNOSIS — I2510 Atherosclerotic heart disease of native coronary artery without angina pectoris: Secondary | ICD-10-CM

## 2022-03-19 DIAGNOSIS — E782 Mixed hyperlipidemia: Secondary | ICD-10-CM | POA: Diagnosis not present

## 2022-03-19 DIAGNOSIS — E559 Vitamin D deficiency, unspecified: Secondary | ICD-10-CM

## 2022-03-19 MED ORDER — AZITHROMYCIN 250 MG PO TABS: ORAL_TABLET | ORAL | 1 refills | Status: DC

## 2022-03-19 MED ORDER — DEXAMETHASONE 1 MG PO TABS: ORAL_TABLET | ORAL | 0 refills | Status: DC

## 2022-03-19 MED ORDER — NITROGLYCERIN 0.4 MG SL SUBL: 0.4000 mg | SUBLINGUAL_TABLET | SUBLINGUAL | 0 refills | Status: DC | PRN

## 2022-03-20 LAB — COMPLETE METABOLIC PANEL WITH GFR
AG Ratio: 1.8 (calc) (ref 1.0–2.5)
ALT: 24 U/L (ref 6–29)
AST: 26 U/L (ref 10–35)
Albumin: 4.1 g/dL (ref 3.6–5.1)
Alkaline phosphatase (APISO): 96 U/L (ref 37–153)
BUN: 13 mg/dL (ref 7–25)
CO2: 26 mmol/L (ref 20–32)
Calcium: 9.2 mg/dL (ref 8.6–10.4)
Chloride: 106 mmol/L (ref 98–110)
Creat: 0.88 mg/dL (ref 0.50–1.05)
Globulin: 2.3 g/dL (calc) (ref 1.9–3.7)
Glucose, Bld: 84 mg/dL (ref 65–99)
Potassium: 4.4 mmol/L (ref 3.5–5.3)
Sodium: 140 mmol/L (ref 135–146)
Total Bilirubin: 1.4 mg/dL — ABNORMAL HIGH (ref 0.2–1.2)
Total Protein: 6.4 g/dL (ref 6.1–8.1)
eGFR: 71 mL/min/{1.73_m2} (ref >=60)

## 2022-03-20 LAB — CBC WITH DIFFERENTIAL/PLATELET
Absolute Monocytes: 409 cells/uL (ref 200–950)
Basophils Absolute: 78 cells/uL (ref 0–200)
Basophils Relative: 1.4 %
Eosinophils Absolute: 342 cells/uL (ref 15–500)
Eosinophils Relative: 6.1 %
HCT: 36.3 % (ref 35.0–45.0)
Hemoglobin: 12.1 g/dL (ref 11.7–15.5)
Lymphs Abs: 2464 cells/uL (ref 850–3900)
MCH: 31.4 pg (ref 27.0–33.0)
MCHC: 33.3 g/dL (ref 32.0–36.0)
MCV: 94.3 fL (ref 80.0–100.0)
MPV: 11 fL (ref 7.5–12.5)
Monocytes Relative: 7.3 %
Neutro Abs: 2307 cells/uL (ref 1500–7800)
Neutrophils Relative %: 41.2 %
Platelets: 258 10*3/uL (ref 140–400)
RBC: 3.85 10*6/uL (ref 3.80–5.10)
RDW: 12.4 % (ref 11.0–15.0)
Total Lymphocyte: 44 %
WBC: 5.6 10*3/uL (ref 3.8–10.8)

## 2022-03-20 LAB — LIPID PANEL
Cholesterol: 128 mg/dL (ref <200)
HDL: 71 mg/dL (ref >=50)
LDL Cholesterol (Calc): 45 mg/dL (calc)
Non-HDL Cholesterol (Calc): 57 mg/dL (calc) (ref <130)
Total CHOL/HDL Ratio: 1.8 (calc) (ref <5.0)
Triglycerides: 42 mg/dL (ref <150)

## 2022-03-20 LAB — TSH: TSH: 1.62 mIU/L (ref 0.40–4.50)

## 2022-06-25 NOTE — Progress Notes (Unsigned)
FOLLOW UP  Assessment and Plan:  Aortic atherosclerosis (HCC) Continue atorvastatin, bASA Control blood pressure, cholesterol, glucose, increase exercise.   CAD Continue atorvastatin, bASA Dr. Percival Spanish following Control blood pressure, cholesterol, glucose, increase exercise.  Denies angina, dyspnea  Nitroglycerin on hand PRN  Essential hypertension Continue Olmesartan, bASA Discussed DASH (Dietary Approaches to Stop Hypertension) DASH diet is lower in sodium than a typical American diet. Cut back on foods that are high in saturated fat, cholesterol, and trans fats. Eat more whole-grain foods, fish, poultry, and nuts Remain active and exercise as tolerated daily.  Monitor BP at home-Call if greater than 130/80.  Check CMP/CBC  Hypothyroidism, unspecified type Controlled. Continue Levothyroxine. Reminded to take on an empty stomach 30-53mns before food.  Stop any Biotin Supplement 48-72 hours before next TSH level to reduce the risk of falsely low TSH levels. Continue to monitor.   Check TSH levels  Hyperlipidemia LDL goal <70 Continue Atorvastatin. Discussed lifestyle modifications. Recommended diet heavy in fruits and veggies, omega 3's. Decrease consumption of animal meats, cheeses, and dairy products. Remain active and exercise as tolerated. Continue to monitor. Check lipids/TSH  Medication management All medications discussed and reviewed in full. All questions and concerns regarding medications addressed.     Vitamin D deficiency At goal, continue supplement  Monitor levels  Abnormal glucose Education: Reviewed 'ABCs' of diabetes management  Discussed goals to be met and/or maintained include A1C (<7) Blood pressure (<130/80) Cholesterol (LDL <70) Continue Eye Exam yearly  Continue Dental Exam Q6 mo Discussed dietary recommendations Discussed Physical Activity recommendations Foot exam UTD Check A1C   GERD Continue Nexium, Carafate No  suspected reflux complications (Barret/stricture). Lifestyle modification:  wt loss, avoid meals 2-3h before bedtime. Consider eliminating food triggers:  chocolate, caffeine, EtOH, acid/spicy food.  Lap Roux Y Gastric Bypass 2014 No recent GI issues or problems related to surgery.   Smoker  Depression/Anxiety Continue Esomeprazole Reviewed relaxation techniques.  Sleep hygiene. Recommended Cognitive Behavioral Therapy (CBT). Recommended mindfulness meditation and exercise.   Psychoeducation:  encouraged personality growth wand development through coping techniques and problem-solving skills. Limit/Decrease/Monitor drug/alcohol intake.     What does she take Gabapentin and Ropinrole for   Continue diet and meds as discussed. Further disposition pending results of labs. Discussed med's effects and SE's.   Over 30 minutes of exam, counseling, chart review, and critical decision making was performed.   Future Appointments  Date Time Provider [place] 06/26/2022 10:30 AM CDarrol Jump NP [facility] None  09/27/2022  9:30 AM MUnk Pinto MD [facility] None  12/21/2022  9:00 AM MUnk Pinto MD [facility] None     ----------------------------------------------------------------------------------------------------------------------  HPI 71y.o. right handed female  presents for 3 month follow up on hypertension, cholesterol, glucose management, weight (s/p roux-en-y) and vitamin D deficiency.   ***  BMI is There is no height or weight on file to calculate BMI., she has been working on diet and exercise, watching portions, cutting back on tea drinking more water. She is active but not intentionally exercising. Hx of roux en Y.  Wt Readings from Last 3 Encounters:  03/19/22 147 lb 6.4 oz (66.9 kg)  01/17/22 155 lb 3.2 oz (70.4 kg)  12/15/21 144 lb (65.3 kg)   She is followed by Dr. HPercival Spanishfor bradycardia and CAD (distal small vessel and non-obstructing  proximal disease); hx normal cardiac cath x2 / ECHO 2019 EF 55-60% LAE MYOVIEW 3/12 EF 60%. She has aortic atherosclerosis per CT 01/2017. Recently in 01/2021 presented  to ED with CP, had normal troponins and EKG, resolved with ASA and nitro x 2, she was advised to follow up with cardiology ASAP but reports she never did so as had just seen him prior to this event and was advised "all good." She denies CP episodes since.   Her blood pressure has been controlled at home 120-130/70's, today their BP is   BP Readings from Last 3 Encounters:  03/19/22 (!) 142/80  01/17/22 128/78  12/15/21 120/70     She does workout. She denies chest pain, shortness of breath.    She is on cholesterol medication (atorvastatin 40 mg daily) and denies myalgias. Her cholesterol is at goal. The cholesterol last visit was:   Lab Results  Component Value Date   CHOL 128 03/19/2022   HDL 71 03/19/2022   LDLCALC 45 03/19/2022   TRIG 42 03/19/2022   CHOLHDL 1.8 03/19/2022    She has been working on diet and exercise for glucose management, and denies increased appetite, nausea, paresthesia of the feet, polydipsia, polyuria, visual disturbances, vomiting and weight loss. Last A1C in the office was:  Lab Results  Component Value Date   HGBA1C 5.5 12/15/2021   She is on thyroid medication, not on biotin. Her medication was not changed last visit.   Lab Results  Component Value Date   TSH 1.62 03/19/2022   Patient is on Vitamin D supplement hasn't changed dose, reports taking 50000 IU every other day   Lab Results  Component Value Date   VD25OH 97 12/15/2021       Current Medications:  Current Outpatient Medications on File Prior to Visit  Medication Sig   Ascorbic Acid (VITAMIN C) 500 MG CAPS Take 1 tablet by mouth daily.    aspirin 81 MG tablet Take 81 mg by mouth daily.   atorvastatin (LIPITOR) 20 MG tablet TAKE 1 TABLET BY MOUTH ONCE DAILY FOR CHOLESTEROL   azithromycin (ZITHROMAX) 250 MG tablet Take 2  tablets (500 mg) on  Day 1,  followed by 1 tablet (250 mg) once daily on Days 2 through 5.   Calcium Citrate-Vitamin D (CALCIUM CITRATE + D3 PO) Take 600 mg by mouth daily.    dexamethasone (DECADRON) 1 MG tablet Take 3 tabs for 3 days, 2 tabs for 3 days 1 tab for 5 days. Take with food.   diphenhydramine-acetaminophen (TYLENOL PM) 25-500 MG TABS tablet Take 1 tablet by mouth at bedtime as needed.   escitalopram (LEXAPRO) 5 MG tablet Take 1 tab daily for mood.   esomeprazole (NEXIUM) 40 MG capsule Take 1 capsule Daily for Heartburn & Indigestion   Ferrous Sulfate (SLOW RELEASE IRON PO) Take 1 tablet by mouth daily.    fish oil-omega-3 fatty acids 1000 MG capsule Take 1 g by mouth daily.   fluticasone (FLONASE) 50 MCG/ACT nasal spray Place 2 sprays into both nostrils daily as needed for allergies.   gabapentin (NEURONTIN) 100 MG capsule Take 1 capsule 4 x /day for Neuropathy pain   levothyroxine (EUTHYROX) 50 MCG tablet Take  1 tablet  Daily  on an empty stomach with only water for 30 minutes & no Antacid meds, Calcium or Magnesium for 4 hours & avoid Biotin   Magnesium 250 MG TABS Take 1 tablet by mouth daily.    Melatonin 10 MG TABS Take 1 tablet by mouth at bedtime.   nitroGLYCERIN (NITROSTAT) 0.4 MG SL tablet Place 1 tablet (0.4 mg total) under the tongue every 5 (five) minutes as needed  for chest pain.   olmesartan (BENICAR) 20 MG tablet Take 1 tablet by mouth once daily for blood pressure   potassium chloride (K-DUR) 10 MEQ tablet Take 1 tablet 2 x /day   rOPINIRole (REQUIP) 3 MG tablet Take  1 tablet  at Bedtime  for Restless Legs   sucralfate (CARAFATE) 1 g tablet Take 1 tablet (1 g total) by mouth 4 (four) times daily.   vitamin B-12 (CYANOCOBALAMIN) 1000 MCG tablet Take 1,000 mcg by mouth daily.    Vitamin D, Ergocalciferol, (DRISDOL) 1.25 MG (50000 UNIT) CAPS capsule Take 1 capsule twice weekly for Severe Vitamin D Deficiency   [DISCONTINUED] olmesartan-hydrochlorothiazide (BENICAR HCT)  20-12.5 MG tablet Take 1 tablet Daily for BP   No current facility-administered medications on file prior to visit.     Allergies:  Allergies  Allergen Reactions   Codeine Other (See Comments)    "puts me out of it".  Dysphoria.  Patient tolerates Tramadol without complications.   Flagyl [Metronidazole] Hives   Nabumetone     GI upset     Medical History:  Past Medical History:  Diagnosis Date   Allergic rhinitis    Arthritis    Cholecystitis, chronic    Dysrhythmia    Hx bradycardia - hx normal cardiac cath x2 / ECHO 2009 EF 60% LAE MYOVIEW 3/12 EF 60% NO ISCHEMIA   Gastrointestinal ulcer due to Helicobacter pylori 23/36/1224   Hyperlipidemia    Hypertension    no treatment after 120 lb weight loss   Hypothyroidism    Prediabetes    Thyroid nodule    Family history- Reviewed and unchanged Social history- Reviewed and unchanged    Review of Systems:  Review of Systems  Constitutional:  Negative for malaise/fatigue and weight loss.  HENT:  Positive for congestion and sinus pain. Negative for hearing loss, sore throat and tinnitus.   Eyes:  Negative for blurred vision and double vision.  Respiratory:  Negative for cough, sputum production, shortness of breath and wheezing.   Cardiovascular:  Negative for chest pain, palpitations, orthopnea, claudication and leg swelling.  Gastrointestinal:  Negative for abdominal pain, blood in stool, constipation, diarrhea, heartburn, melena, nausea and vomiting.  Genitourinary: Negative.   Musculoskeletal:  Negative for joint pain and myalgias.  Skin:  Negative for rash.  Neurological:  Negative for dizziness, tingling, sensory change, weakness and headaches.  Endo/Heme/Allergies:  Negative for environmental allergies and polydipsia.  Psychiatric/Behavioral: Negative.  Negative for depression. The patient is not nervous/anxious and does not have insomnia.   All other systems reviewed and are negative.   Physical Exam: LMP  (LMP  Unknown)  Wt Readings from Last 3 Encounters:  03/19/22 147 lb 6.4 oz (66.9 kg)  01/17/22 155 lb 3.2 oz (70.4 kg)  12/15/21 144 lb (65.3 kg)   General Appearance: Well nourished, in no apparent distress. Eyes: PERRLA, EOMs, conjunctiva no swelling or erythema Sinuses:  Generalized maxillary tenderness, puffy under eyes ENT/Mouth: Ext aud canals clear, TMs without erythema, bulging. No erythema, swelling, or exudate on post pharynx.  Tonsils not swollen or erythematous. Hearing normal.  Neck: Supple, thyroid normal.  Respiratory: Respiratory effort normal, BS equal bilaterally without rales, rhonchi, wheezing or stridor.  Cardio: RRR with no MRGs. Brisk peripheral pulses without edema.  Abdomen: Soft, + BS.  Non-tender, no guarding, rebound, hernias, masses. Lymphatics: Positive submandibular adenopathy bilaterally Musculoskeletal: Full ROM, 5/5 strength, Normal gait.  Skin: Warm, dry, intact without rash, concerning lesions, ecchymosis.  Neuro: Cranial nerves intact.  No cerebellar symptoms.  Psych: Awake and oriented X 3, normal affect, Insight and Judgment appropriate.     Darrol Jump, NP 10:36 PM [facility]

## 2022-06-26 ENCOUNTER — Encounter: Payer: Self-pay | Admitting: Nurse Practitioner

## 2022-06-26 ENCOUNTER — Ambulatory Visit (INDEPENDENT_AMBULATORY_CARE_PROVIDER_SITE_OTHER): Payer: Federal, State, Local not specified - PPO | Admitting: Nurse Practitioner

## 2022-06-26 VITALS — BP 130/78 | HR 56 | Temp 97.9°F | Resp 16 | Ht 62.75 in | Wt 148.4 lb

## 2022-06-26 DIAGNOSIS — K219 Gastro-esophageal reflux disease without esophagitis: Secondary | ICD-10-CM

## 2022-06-26 DIAGNOSIS — E782 Mixed hyperlipidemia: Secondary | ICD-10-CM | POA: Diagnosis not present

## 2022-06-26 DIAGNOSIS — I700 Atherosclerosis of aorta: Secondary | ICD-10-CM | POA: Diagnosis not present

## 2022-06-26 DIAGNOSIS — F17200 Nicotine dependence, unspecified, uncomplicated: Secondary | ICD-10-CM

## 2022-06-26 DIAGNOSIS — E039 Hypothyroidism, unspecified: Secondary | ICD-10-CM

## 2022-06-26 DIAGNOSIS — I10 Essential (primary) hypertension: Secondary | ICD-10-CM | POA: Diagnosis not present

## 2022-06-26 DIAGNOSIS — F418 Other specified anxiety disorders: Secondary | ICD-10-CM

## 2022-06-26 DIAGNOSIS — I2510 Atherosclerotic heart disease of native coronary artery without angina pectoris: Secondary | ICD-10-CM | POA: Diagnosis not present

## 2022-06-26 DIAGNOSIS — G2581 Restless legs syndrome: Secondary | ICD-10-CM

## 2022-06-26 DIAGNOSIS — Z79899 Other long term (current) drug therapy: Secondary | ICD-10-CM

## 2022-06-26 DIAGNOSIS — R7309 Other abnormal glucose: Secondary | ICD-10-CM

## 2022-06-26 DIAGNOSIS — E559 Vitamin D deficiency, unspecified: Secondary | ICD-10-CM

## 2022-06-26 DIAGNOSIS — Z9884 Bariatric surgery status: Secondary | ICD-10-CM

## 2022-06-26 NOTE — Patient Instructions (Signed)

## 2022-06-27 LAB — COMPLETE METABOLIC PANEL WITH GFR
AG Ratio: 2 (calc) (ref 1.0–2.5)
ALT: 22 U/L (ref 6–29)
AST: 25 U/L (ref 10–35)
Albumin: 4.3 g/dL (ref 3.6–5.1)
Alkaline phosphatase (APISO): 100 U/L (ref 37–153)
BUN: 12 mg/dL (ref 7–25)
CO2: 25 mmol/L (ref 20–32)
Calcium: 9.3 mg/dL (ref 8.6–10.4)
Chloride: 110 mmol/L (ref 98–110)
Creat: 0.89 mg/dL (ref 0.60–1.00)
Globulin: 2.2 g/dL (calc) (ref 1.9–3.7)
Glucose, Bld: 85 mg/dL (ref 65–99)
Potassium: 4.8 mmol/L (ref 3.5–5.3)
Sodium: 143 mmol/L (ref 135–146)
Total Bilirubin: 1.9 mg/dL — ABNORMAL HIGH (ref 0.2–1.2)
Total Protein: 6.5 g/dL (ref 6.1–8.1)
eGFR: 70 mL/min/{1.73_m2} (ref >=60)

## 2022-06-27 LAB — CBC WITH DIFFERENTIAL/PLATELET
Absolute Monocytes: 393 cells/uL (ref 200–950)
Basophils Absolute: 92 cells/uL (ref 0–200)
Basophils Relative: 1.8 %
Eosinophils Absolute: 311 cells/uL (ref 15–500)
Eosinophils Relative: 6.1 %
HCT: 36.1 % (ref 35.0–45.0)
Hemoglobin: 11.9 g/dL (ref 11.7–15.5)
Lymphs Abs: 2285 cells/uL (ref 850–3900)
MCH: 30.7 pg (ref 27.0–33.0)
MCHC: 33 g/dL (ref 32.0–36.0)
MCV: 93.3 fL (ref 80.0–100.0)
MPV: 11.4 fL (ref 7.5–12.5)
Monocytes Relative: 7.7 %
Neutro Abs: 2020 cells/uL (ref 1500–7800)
Neutrophils Relative %: 39.6 %
Platelets: 242 10*3/uL (ref 140–400)
RBC: 3.87 10*6/uL (ref 3.80–5.10)
RDW: 12.6 % (ref 11.0–15.0)
Total Lymphocyte: 44.8 %
WBC: 5.1 10*3/uL (ref 3.8–10.8)

## 2022-06-27 LAB — TSH: TSH: 2.15 mIU/L (ref 0.40–4.50)

## 2022-07-04 ENCOUNTER — Encounter: Payer: Self-pay | Admitting: Nurse Practitioner

## 2022-07-04 ENCOUNTER — Other Ambulatory Visit: Payer: Self-pay

## 2022-07-04 ENCOUNTER — Ambulatory Visit (INDEPENDENT_AMBULATORY_CARE_PROVIDER_SITE_OTHER): Payer: Federal, State, Local not specified - PPO | Admitting: Nurse Practitioner

## 2022-07-04 VITALS — BP 150/74 | HR 62 | Temp 98.2°F | Ht 62.75 in | Wt 148.8 lb

## 2022-07-04 DIAGNOSIS — R6889 Other general symptoms and signs: Secondary | ICD-10-CM

## 2022-07-04 DIAGNOSIS — J029 Acute pharyngitis, unspecified: Secondary | ICD-10-CM | POA: Diagnosis not present

## 2022-07-04 DIAGNOSIS — J320 Chronic maxillary sinusitis: Secondary | ICD-10-CM | POA: Diagnosis not present

## 2022-07-04 DIAGNOSIS — Z1152 Encounter for screening for COVID-19: Secondary | ICD-10-CM

## 2022-07-04 DIAGNOSIS — I10 Essential (primary) hypertension: Secondary | ICD-10-CM

## 2022-07-04 LAB — POCT INFLUENZA A/B
Influenza A, POC: NEGATIVE
Influenza B, POC: NEGATIVE

## 2022-07-04 LAB — POC COVID19 BINAXNOW: SARS Coronavirus 2 Ag: NEGATIVE

## 2022-07-04 LAB — POCT RAPID STREP A (OFFICE): Rapid Strep A Screen: NEGATIVE

## 2022-07-04 MED ORDER — AMOXICILLIN-POT CLAVULANATE 875-125 MG PO TABS: 1.0000 | ORAL_TABLET | Freq: Two times a day (BID) | ORAL | 0 refills | Status: DC

## 2022-07-04 MED ORDER — PREDNISONE 20 MG PO TABS: ORAL_TABLET | ORAL | 0 refills | Status: AC

## 2022-07-04 NOTE — Progress Notes (Signed)
Assessment and Plan: Tracy Paul was seen today for acute visit.  Diagnoses and all orders for this visit:  Flu-like symptoms -     POCT Influenza A/B  Negative  Encounter for screening for COVID-19 -     POC COVID-19  Negative  Sore throat -     POCT rapid strep A  Negative  Maxillary sinusitis, unspecified chronicity Push fluids Use Tylenol as needed for fever/aches Mucinex as needed If symptoms are not markedly improved early next week notify the office -     amoxicillin-clavulanate (AUGMENTIN) 875-125 MG tablet; Take 1 tablet by mouth 2 (two) times daily. -     predniSONE (DELTASONE) 20 MG tablet; 3 tablets daily with food for 3 days, 2 tabs daily for 3 days, 1 tab a day for 5 days.   Essential Hypertension - continue medications, DASH diet, exercise and monitor at home. Call if greater than 130/80.  Go to the ER if any chest pain, shortness of breath, nausea, dizziness, severe HA, changes vision/speech      Further disposition pending results of labs. Discussed med's effects and SE's.   Over 30 minutes of exam, counseling, chart review, and critical decision making was performed.   Future Appointments  Date Time Provider [place]  09/27/2022  9:30 AM [name], MD [facility] None  12/21/2022  9:00 AM [name], MD [facility] None    ------------------------------------------------------------------------------------------------------------------   HPI BP (!) 150/74   Pulse 62   Temp 98.2 F (36.8 C)   Ht 5' 2.75" (1.594 m)   Wt 148 lb 12.8 oz (67.5 kg)   LMP  (LMP Unknown)   SpO2 98%   BMI 26.57 kg/m    71 y.o.female presents for Fever/chills, sinus congestion with brownish nasal discharge, body aches, headaches, fatigue, sore throat and  productive cough of brownish mucus. Covid test and flu test are negative today. Symptoms began 3 days ago.  Denies nausea/vomiting/diarrhea. She has not been taking any over the counter medication.    She is currently on Benicar 20 mg QD.  BP Readings from Last 3 Encounters:  07/04/22 (!) 150/74  06/26/22 130/78  03/19/22 (!) 142/80   BMI is Body mass index is 26.57 kg/m., she has been working on diet and exercise. Wt Readings from Last 3 Encounters:  07/04/22 148 lb 12.8 oz (67.5 kg)  06/26/22 148 lb 6.4 oz (67.3 kg)  03/19/22 147 lb 6.4 oz (66.9 kg)     Past Medical History:  Diagnosis Date   Allergic rhinitis    Arthritis    Cholecystitis, chronic    Dysrhythmia    Hx bradycardia - hx normal cardiac cath x2 / ECHO 2009 EF 60% LAE MYOVIEW 3/12 EF 60% NO ISCHEMIA   Gastrointestinal ulcer due to Helicobacter pylori 18/29/9371   Hyperlipidemia    Hypertension    no treatment after 120 lb weight loss   Hypothyroidism    Prediabetes    Thyroid nodule      Allergies  Allergen Reactions   Codeine Other (See Comments)    "puts me out of it".  Dysphoria.  Patient tolerates Tramadol without complications.   Flagyl [Metronidazole] Hives   Nabumetone     GI upset    Current Outpatient Medications on File Prior to Visit  Medication Sig   Ascorbic Acid (VITAMIN C) 500 MG CAPS Take 1 tablet by mouth daily.    aspirin 81 MG tablet Take 81 mg by mouth daily.   atorvastatin (LIPITOR) 20 MG  tablet TAKE 1 TABLET BY MOUTH ONCE DAILY FOR CHOLESTEROL   Calcium Citrate-Vitamin D (CALCIUM CITRATE + D3 PO) Take 600 mg by mouth daily.    diphenhydramine-acetaminophen (TYLENOL PM) 25-500 MG TABS tablet Take 1 tablet by mouth at bedtime as needed.   escitalopram (LEXAPRO) 5 MG tablet Take 1 tab daily for mood.   esomeprazole (NEXIUM) 40 MG capsule Take 1 capsule Daily for Heartburn & Indigestion   Ferrous Sulfate (SLOW RELEASE IRON PO) Take 1 tablet by mouth daily.    fish oil-omega-3 fatty acids 1000 MG capsule Take 1 g by mouth daily.   fluticasone (FLONASE) 50 MCG/ACT nasal spray Place 2 sprays into both nostrils daily as needed for allergies.   gabapentin (NEURONTIN) 100 MG  capsule Take 1 capsule 4 x /day for Neuropathy pain   levothyroxine (EUTHYROX) 50 MCG tablet Take  1 tablet  Daily  on an empty stomach with only water for 30 minutes & no Antacid meds, Calcium or Magnesium for 4 hours & avoid Biotin   Magnesium 250 MG TABS Take 1 tablet by mouth daily.    Melatonin 10 MG TABS Take 1 tablet by mouth at bedtime.   nitroGLYCERIN (NITROSTAT) 0.4 MG SL tablet Place 1 tablet (0.4 mg total) under the tongue every 5 (five) minutes as needed for chest pain.   olmesartan (BENICAR) 20 MG tablet Take 1 tablet by mouth once daily for blood pressure   potassium chloride (K-DUR) 10 MEQ tablet Take 1 tablet 2 x /day   rOPINIRole (REQUIP) 3 MG tablet Take  1 tablet  at Bedtime  for Restless Legs   sucralfate (CARAFATE) 1 g tablet Take 1 tablet (1 g total) by mouth 4 (four) times daily.   vitamin B-12 (CYANOCOBALAMIN) 1000 MCG tablet Take 1,000 mcg by mouth daily.    Vitamin D, Ergocalciferol, (DRISDOL) 1.25 MG (50000 UNIT) CAPS capsule Take 1 capsule twice weekly for Severe Vitamin D Deficiency   azithromycin (ZITHROMAX) 250 MG tablet Take 2 tablets (500 mg) on  Day 1,  followed by 1 tablet (250 mg) once daily on Days 2 through 5. (Patient not taking: Reported on 06/26/2022)   dexamethasone (DECADRON) 1 MG tablet Take 3 tabs for 3 days, 2 tabs for 3 days 1 tab for 5 days. Take with food. (Patient not taking: Reported on 06/26/2022)   [DISCONTINUED] olmesartan-hydrochlorothiazide (BENICAR HCT) 20-12.5 MG tablet Take 1 tablet Daily for BP   No current facility-administered medications on file prior to visit.    ROS: all negative except above.   Physical Exam:  BP (!) 150/74   Pulse 62   Temp 98.2 F (36.8 C)   Ht 5' 2.75" (1.594 m)   Wt 148 lb 12.8 oz (67.5 kg)   LMP  (LMP Unknown)   SpO2 98%   BMI 26.57 kg/m   General Appearance: Well nourished, in no apparent distress. Eyes: PERRLA, EOMs, conjunctiva no swelling or erythema Sinuses: No Frontal/maxillary  tenderness ENT/Mouth: Ext aud canals clear, TMs without erythema, bulging. No erythema, swelling, or exudate on post pharynx.  Tonsils not swollen or erythematous. Hearing normal.  Neck: Supple, thyroid normal.  Respiratory: Respiratory effort normal, BS equal bilaterally without rales, rhonchi, wheezing or stridor.  Cardio: RRR with no MRGs. Brisk peripheral pulses without edema.  Abdomen: Soft, + BS.  Non tender, no guarding, rebound, hernias, masses. Lymphatics: Non tender without lymphadenopathy.  Musculoskeletal: Full ROM, 5/5 strength, normal gait.  Skin: Warm, dry without rashes, lesions, ecchymosis.  Neuro: Cranial  nerves intact. Normal muscle tone, no cerebellar symptoms. Sensation intact.  Psych: Awake and oriented X 3, normal affect, Insight and Judgment appropriate.     Alycia Rossetti, NP 3:15 PM [facility]

## 2022-07-10 DIAGNOSIS — J329 Chronic sinusitis, unspecified: Secondary | ICD-10-CM | POA: Insufficient documentation

## 2022-07-23 DIAGNOSIS — Z9889 Other specified postprocedural states: Secondary | ICD-10-CM | POA: Insufficient documentation

## 2022-08-06 DIAGNOSIS — J320 Chronic maxillary sinusitis: Secondary | ICD-10-CM | POA: Insufficient documentation

## 2022-08-23 ENCOUNTER — Ambulatory Visit (INDEPENDENT_AMBULATORY_CARE_PROVIDER_SITE_OTHER): Payer: Federal, State, Local not specified - PPO

## 2022-08-23 VITALS — Temp 98.0°F

## 2022-08-23 DIAGNOSIS — Z23 Encounter for immunization: Secondary | ICD-10-CM | POA: Diagnosis not present

## 2022-09-05 ENCOUNTER — Other Ambulatory Visit: Payer: Self-pay

## 2022-09-05 MED ORDER — LEVOTHYROXINE SODIUM 50 MCG PO TABS: ORAL_TABLET | ORAL | 1 refills | Status: DC

## 2022-09-26 ENCOUNTER — Encounter: Payer: Self-pay | Admitting: Internal Medicine

## 2022-09-26 NOTE — Progress Notes (Unsigned)
Future Appointments  Date Time Provider Department  09/27/2022  9:30 AM [name], MD [facility]  12/21/2022  9:00 AM [name], MD [facility]    History of Present Illness:       This very nice 70 y.o. MWF  presents for 9 month follow up with HTN, HLD and Vitamin D Deficiency.   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    Since gastric bi-pass in 2013, she had lost  ~ 130 # and has remained diet controlled .   She has aortic atherosclerosis per CT 01/2017.        Patient is treated for HTN  since  & BP has been controlled at home. Today's  . Patient has had no complaints of any cardiac type chest pain, palpitations, dyspnea / orthopnea / PND, dizziness, claudication, or dependent edema.       Hyperlipidemia is controlled with diet & meds. Patient denies myalgias or other med SE's. Last Lipids were  Lab Results  Component Value Date   CHOL 128 03/19/2022   HDL 71 03/19/2022   LDLCALC 45 03/19/2022   TRIG 42 03/19/2022   CHOLHDL 1.8 03/19/2022     Also, the patient has history of T2_NIDDM  (2008) and has had no symptoms of reactive hypoglycemia, diabetic polys, paresthesias or visual blurring.  Last A1c was at goal :  Lab Results  Component Value Date   HGBA1C 5.5 12/15/2021        Further, the patient also has history of Vitamin D Deficiency ("28" /2008) and supplements vitamin D . Last vitamin D was at goal :   Lab Results  Component Value Date   VD25OH 97 12/15/2021     Current Outpatient Medications on File Prior to Visit  Medication Sig   Ascorbic Acid (VITAMIN C) 500 MG CAPS Take 1 tablet by mouth daily.    aspirin 81 MG tablet Take 81 mg by mouth daily.   atorvastatin (LIPITOR) 20 MG tablet TAKE 1 TABLET BY MOUTH ONCE DAILY FOR CHOLESTEROL   Calcium Citrate-Vitamin D (CALCIUM CITRATE + D3 PO) Take 600 mg by mouth daily.    diphenhydramine-acetaminophen (TYLENOL PM) 25-500 MG TABS tablet Take 1 tablet by mouth at bedtime as needed.    escitalopram (LEXAPRO) 5 MG tablet Take 1 tab daily for mood.   esomeprazole (NEXIUM) 40 MG capsule Take 1 capsule Daily for Heartburn & Indigestion   Ferrous Sulfate (SLOW RELEASE IRON PO) Take 1 tablet by mouth daily.    fish oil-omega-3 fatty acids 1000 MG capsule Take 1 g by mouth daily.   fluticasone (FLONASE) 50 MCG/ACT nasal spray Place 2 sprays into both nostrils daily as needed for allergies.   gabapentin (NEURONTIN) 100 MG capsule Take 1 capsule 4 x /day for Neuropathy pain   levothyroxine (EUTHYROX) 50 MCG tablet Take  1 tablet  Daily     Magnesium 250 MG TABS Take 1 tablet by mouth daily.    Melatonin 10 MG TABS Take 1 tablet by mouth at bedtime.   NITROSTAT 0.4 MG SL  as needed for chest pain.   olmesartan (BENICAR) 20 MG tablet Take 1 tablet by mouth once daily for blood pressure   potassium chloride (K-DUR) 10 MEQ tablet Take 1 tablet 2 x /day   rOPINIRole (REQUIP) 3 MG tablet Take  1 tablet  at Bedtime  for Restless Legs   sucralfate (CARAFATE) 1 g tablet Take 1 tablet (1 g total) by mouth 4 (four) times daily.  vitamin B-12 (CYANOCOBALAMIN) 1000 MCG tablet Take 1,000 mcg by mouth daily.    Vitamin D, Ergocalciferol, (DRISDOL) 1.25 MG (50000 UNIT) CAPS capsule Take 1 capsule twice weekly for Severe Vitamin D Deficiency   [DISCONTINUED] olmesartan-hydrochlorothiazide (BENICAR HCT) 20-12.5 MG tablet Take 1 tablet Daily for BP     Allergies  Allergen Reactions   Codeine Other (See Comments)    "puts me out of it".  Dysphoria.  Patient tolerates Tramadol without complications.   Flagyl [Metronidazole] Hives   Nabumetone     GI upset     PMHx:   Past Medical History:  Diagnosis Date   Allergic rhinitis    Arthritis    Cholecystitis, chronic    Dysrhythmia    Hx bradycardia - hx normal cardiac cath x2 / ECHO 2009 EF 60% LAE MYOVIEW 3/12 EF 60% NO ISCHEMIA   Gastrointestinal ulcer due to Helicobacter pylori 34/12/7094   Hyperlipidemia    Hypertension    no treatment  after 120 lb weight loss   Hypothyroidism    Prediabetes    Thyroid nodule      Immunization History  Administered Date(s) Administered   Influenza  08/25/2013, 09/02/2014, 08/10/2015   Influenza, High Dose  07/23/2019, 08/23/2020, 09/07/2021, 08/23/2022   Influenza 08/10/2016   Moderna Sars-Covid-2 Vacc 12/30/2019, 01/27/2020   PPD Test 02/23/2014, 05/23/2016   Pneumococcal  -13 07/31/2018   Pneumococcal  -23 10/18/2008, 10/08/2019   Td 08/08/2006   Tdap 05/23/2016   Zoster, Live 10/15/2012     Past Surgical History:  Procedure Laterality Date   Bilateral carpal tunnel surgery     BREATH TEK H PYLORI  07/01/2012   Procedure: BREATH TEK H PYLORI;  Surgeon: Edward Jolly, MD;  Location: [facility];  Service: General;  Laterality: N/A;   CHOLECYSTECTOMY N/A 11/27/2013   Procedure: LAPAROSCOPIC CHOLECYSTECTOMY WITH INTRAOPERATIVE CHOLANGIOGRAM;  Surgeon: Pedro Earls, MD;  Location: [facility];  Service: General;  Laterality: N/A;   GASTRIC ROUX-EN-Y N/A 12/16/2012   Procedure: LAPAROSCOPIC ROUX-EN-Y GASTRIC BYPASS WITH UPPER ENDOSCOPY;  Surgeon: Pedro Earls, MD;  Location: [facility];  Service: General;  Laterality: N/A;  Gastric Bypass   NASAL SINUS SURGERY     REPLACEMENT TOTAL KNEE BILATERAL     bilat   TUBAL LIGATION      FHx:    Reviewed / unchanged  SHx:    Reviewed / unchanged   Systems Review:  Constitutional: Denies fever, chills, wt changes, headaches, insomnia, fatigue, night sweats, change in appetite. Eyes: Denies redness, blurred vision, diplopia, discharge, itchy, watery eyes.  ENT: Denies discharge, congestion, post nasal drip, epistaxis, sore throat, earache, hearing loss, dental pain, tinnitus, vertigo, sinus pain, snoring.  CV: Denies chest pain, palpitations, irregular heartbeat, syncope, dyspnea, diaphoresis, orthopnea, PND, claudication or edema. Respiratory: denies cough, dyspnea, DOE, pleurisy, hoarseness, laryngitis, wheezing.   Gastrointestinal: Denies dysphagia, odynophagia, heartburn, reflux, water brash, abdominal pain or cramps, nausea, vomiting, bloating, diarrhea, constipation, hematemesis, melena, hematochezia  or hemorrhoids. Genitourinary: Denies dysuria, frequency, urgency, nocturia, hesitancy, discharge, hematuria or flank pain. Musculoskeletal: Denies arthralgias, myalgias, stiffness, jt. swelling, pain, limping or strain/sprain.  Skin: Denies pruritus, rash, hives, warts, acne, eczema or change in skin lesion(s). Neuro: No weakness, tremor, incoordination, spasms, paresthesia or pain. Psychiatric: Denies confusion, memory loss or sensory loss. Endo: Denies change in weight, skin or hair change.  Heme/Lymph: No excessive bleeding, bruising or enlarged lymph nodes.  Physical Exam  LMP  (LMP Unknown)   Appears  well nourished,  well groomed  and in no distress.  Eyes: PERRLA, EOMs, conjunctiva no swelling or erythema. Sinuses: No frontal/maxillary tenderness ENT/Mouth: EAC's clear, TM's nl w/o erythema, bulging. Nares clear w/o erythema, swelling, exudates. Oropharynx clear without erythema or exudates. Oral hygiene is good. Tongue normal, non obstructing. Hearing intact.  Neck: Supple. Thyroid not palpable. Car 2+/2+ without bruits, nodes or JVD. Chest: Respirations nl with BS clear & equal w/o rales, rhonchi, wheezing or stridor.  Cor: Heart sounds normal w/ regular rate and rhythm without sig. murmurs, gallops, clicks or rubs. Peripheral pulses normal and equal  without edema.  Abdomen: Soft & bowel sounds normal. Non-tender w/o guarding, rebound, hernias, masses or organomegaly.  Lymphatics: Unremarkable.  Musculoskeletal: Full ROM all peripheral extremities, joint stability, 5/5 strength and normal gait.  Skin: Warm, dry without exposed rashes, lesions or ecchymosis apparent.  Neuro: Cranial nerves intact, reflexes equal bilaterally. Sensory-motor testing grossly intact. Tendon reflexes grossly  intact.  Pysch: Alert & oriented x 3.  Insight and judgement nl & appropriate. No ideations.  Assessment and Plan:   1. Essential hypertension  - Continue medication, monitor blood pressure at home.  - Continue DASH diet.  Reminder to go to the ER if any CP,  SOB, nausea, dizziness, severe HA, changes vision/speech.    - CBC with Differential/Platelet - COMPLETE METABOLIC PANEL WITH GFR - Magnesium - TSH  2. Hyperlipidemia associated with type 2 diabetes mellitus ([place])  - Continue diet/meds, exercise,& lifestyle modifications.  - Continue monitor periodic cholesterol/liver & renal functions    - Lipid panel - TSH  3. Type 2 diabetes mellitus with stage 2 chronic kidney  disease, without long-term current use of insulin ([facility])  - Continue diet, exercise  - Lifestyle modifications.  - Monitor appropriate labs   - Hemoglobin A1c - Insulin, random  4. Vitamin D deficiency  - Continue supplementation   - VITAMIN D 25 Hydroxy   5. 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 Coronary artery disease involving native coronary  artery of native heart without angina pectoris   7. Aortic atherosclerosis ([place]) by Abd CT scan on 08/01/2017  - Lipid panel  8. 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 Medication management  - CBC with Differential/Platelet - COMPLETE METABOLIC PANEL WITH GFR - Magnesium - Lipid panel - TSH - Hemoglobin A1c - Insulin, random - VITAMIN D 25 Hydroxy  - Vitamin B12 - Iron, Total/Total Iron Binding Cap         Discussed  regular exercise, BP monitoring, weight control to achieve/maintain BMI less than 25 and discussed med and SE's. Recommended labs to assess /monitor clinical status .  I discussed the assessment and treatment plan with the patient. The patient was provided an opportunity to ask questions and all were answered. The patient agreed with the plan and  demonstrated an understanding of the instructions.  I provided over 30 minutes of exam, counseling, chart review and  complex critical decision making.        The patient was advised to call back or seek an in-person evaluation if the symptoms worsen or if the condition fails to improve as anticipated.   Kirtland Bouchard, MD

## 2022-09-26 NOTE — Patient Instructions (Signed)

## 2022-09-27 ENCOUNTER — Encounter: Payer: Self-pay | Admitting: Internal Medicine

## 2022-09-27 ENCOUNTER — Ambulatory Visit: Payer: Federal, State, Local not specified - PPO | Admitting: Internal Medicine

## 2022-09-27 VITALS — BP 136/78 | HR 50 | Temp 96.8°F | Ht 62.75 in | Wt 145.4 lb

## 2022-09-27 DIAGNOSIS — E1169 Type 2 diabetes mellitus with other specified complication: Secondary | ICD-10-CM

## 2022-09-27 DIAGNOSIS — E1122 Type 2 diabetes mellitus with diabetic chronic kidney disease: Secondary | ICD-10-CM

## 2022-09-27 DIAGNOSIS — E785 Hyperlipidemia, unspecified: Secondary | ICD-10-CM

## 2022-09-27 DIAGNOSIS — I10 Essential (primary) hypertension: Secondary | ICD-10-CM

## 2022-09-27 DIAGNOSIS — D518 Other vitamin B12 deficiency anemias: Secondary | ICD-10-CM | POA: Diagnosis not present

## 2022-09-27 DIAGNOSIS — G2581 Restless legs syndrome: Secondary | ICD-10-CM

## 2022-09-27 DIAGNOSIS — E559 Vitamin D deficiency, unspecified: Secondary | ICD-10-CM | POA: Diagnosis not present

## 2022-09-27 DIAGNOSIS — I2510 Atherosclerotic heart disease of native coronary artery without angina pectoris: Secondary | ICD-10-CM

## 2022-09-27 DIAGNOSIS — I700 Atherosclerosis of aorta: Secondary | ICD-10-CM

## 2022-09-27 DIAGNOSIS — K9189 Other postprocedural complications and disorders of digestive system: Secondary | ICD-10-CM

## 2022-09-27 DIAGNOSIS — Z79899 Other long term (current) drug therapy: Secondary | ICD-10-CM

## 2022-09-27 DIAGNOSIS — Z903 Acquired absence of stomach [part of]: Secondary | ICD-10-CM

## 2022-09-27 DIAGNOSIS — D508 Other iron deficiency anemias: Secondary | ICD-10-CM

## 2022-09-27 DIAGNOSIS — N182 Chronic kidney disease, stage 2 (mild): Secondary | ICD-10-CM

## 2022-09-27 MED ORDER — ROPINIROLE HCL 3 MG PO TABS: ORAL_TABLET | ORAL | 3 refills | Status: AC

## 2022-09-28 LAB — COMPLETE METABOLIC PANEL WITH GFR
AG Ratio: 1.6 (calc) (ref 1.0–2.5)
ALT: 17 U/L (ref 6–29)
AST: 22 U/L (ref 10–35)
Albumin: 4.2 g/dL (ref 3.6–5.1)
Alkaline phosphatase (APISO): 97 U/L (ref 37–153)
BUN: 12 mg/dL (ref 7–25)
CO2: 27 mmol/L (ref 20–32)
Calcium: 9.4 mg/dL (ref 8.6–10.4)
Chloride: 106 mmol/L (ref 98–110)
Creat: 0.94 mg/dL (ref 0.60–1.00)
Globulin: 2.6 g/dL (calc) (ref 1.9–3.7)
Glucose, Bld: 90 mg/dL (ref 65–99)
Potassium: 4.6 mmol/L (ref 3.5–5.3)
Sodium: 143 mmol/L (ref 135–146)
Total Bilirubin: 1.9 mg/dL — ABNORMAL HIGH (ref 0.2–1.2)
Total Protein: 6.8 g/dL (ref 6.1–8.1)
eGFR: 65 mL/min/{1.73_m2} (ref >=60)

## 2022-09-28 LAB — IRON, TOTAL/TOTAL IRON BINDING CAP
%SAT: 18 % (calc) (ref 16–45)
Iron: 65 ug/dL (ref 45–160)
TIBC: 365 mcg/dL (calc) (ref 250–450)

## 2022-09-28 LAB — HEMOGLOBIN A1C
Hgb A1c MFr Bld: 5.7 % of total Hgb — ABNORMAL HIGH (ref <5.7)
Mean Plasma Glucose: 117 mg/dL
eAG (mmol/L): 6.5 mmol/L

## 2022-09-28 LAB — CBC WITH DIFFERENTIAL/PLATELET
Absolute Monocytes: 299 cells/uL (ref 200–950)
Basophils Absolute: 78 cells/uL (ref 0–200)
Basophils Relative: 1.7 %
Eosinophils Absolute: 299 cells/uL (ref 15–500)
Eosinophils Relative: 6.5 %
HCT: 38.8 % (ref 35.0–45.0)
Hemoglobin: 12.9 g/dL (ref 11.7–15.5)
Lymphs Abs: 2236 cells/uL (ref 850–3900)
MCH: 31.2 pg (ref 27.0–33.0)
MCHC: 33.2 g/dL (ref 32.0–36.0)
MCV: 93.7 fL (ref 80.0–100.0)
MPV: 10.9 fL (ref 7.5–12.5)
Monocytes Relative: 6.5 %
Neutro Abs: 1688 cells/uL (ref 1500–7800)
Neutrophils Relative %: 36.7 %
Platelets: 239 10*3/uL (ref 140–400)
RBC: 4.14 10*6/uL (ref 3.80–5.10)
RDW: 12.3 % (ref 11.0–15.0)
Total Lymphocyte: 48.6 %
WBC: 4.6 10*3/uL (ref 3.8–10.8)

## 2022-09-28 LAB — INSULIN, RANDOM: Insulin: 3.8 u[IU]/mL

## 2022-09-28 LAB — TSH: TSH: 3.33 mIU/L (ref 0.40–4.50)

## 2022-09-28 LAB — LIPID PANEL
Cholesterol: 144 mg/dL (ref <200)
HDL: 77 mg/dL (ref >=50)
LDL Cholesterol (Calc): 53 mg/dL (calc)
Non-HDL Cholesterol (Calc): 67 mg/dL (calc) (ref <130)
Total CHOL/HDL Ratio: 1.9 (calc) (ref <5.0)
Triglycerides: 48 mg/dL (ref <150)

## 2022-09-28 LAB — MAGNESIUM: Magnesium: 2 mg/dL (ref 1.5–2.5)

## 2022-09-28 LAB — VITAMIN B12: Vitamin B-12: 326 pg/mL (ref 200–1100)

## 2022-09-28 LAB — VITAMIN D 25 HYDROXY (VIT D DEFICIENCY, FRACTURES): Vit D, 25-Hydroxy: 80 ng/mL (ref 30–100)

## 2022-09-29 NOTE — Progress Notes (Signed)
<><><><><><><><><><><><><><><><><><><><><><><><><><><><><><><><><> <><><><><><><><><><><><><><><><><><><><><><><><><><><><><><><><><> -   Test results slightly outside the reference range are not unusual. If there is anything important, I will review this with you,  otherwise it is considered normal test values.  If you have further questions,  please do not hesitate to contact me at the office or via My Chart.  <><><><><><><><><><><><><><><><><><><><><><><><><><><><><><><><><> <><><><><><><><><><><><><><><><><><><><><><><><><><><><><><><><><>  -  A1c =- 5.7% I is borderline elevated Blood sugars again , So   - Avoid Sweets, Candy & White Stuff   - White Rice, White Potatoes, White Flour  - Breads &  Pasta <><><><><><><><><><><><><><><><><><><><><><><><><><><><><><><><><> <><><><><><><><><><><><><><><><><><><><><><><><><><><><><><><><><>  -  Total Chol  = 144       &       LDL Chol = 53    Both  Excellent   - Very low risk for Heart Attack  / Stroke <><><><><><><><><><><><><><><><><><><><><><><><><><><><><><><><><>  -  Vitamin D = 80  - Excellent  - Please keep dose same  <><><><><><><><><><><><><><><><><><><><><><><><><><><><><><><><><>   -  Vitamin B12 =    326    Very Low due to your gastric Bipass surgery &                                                                          not able to absorb B12 in the intestines  (Ideal or Goal Vit B12 is between 450 - 1,100)   Low Vit B12 may be associated with Anemia , Fatigue,   Peripheral Neuropathy, Dementia, "Brain Fog", & Depression  -  You ABSOLUTELY must take the Vitamin B12 that                                                                         DISSOLVES in the mouth  Under the tongue    Recommend take a sub-lingual form of Vitamin B12 tablet   1,000 to 5,000 mcg tab that you dissolve under your tongue /Daily   - Can get Baron Sane - best price at [employer] or on  [employer] <><><><><><><><><><><><><><><><><><><><><><><><><><><><><><><><><>  -  Iron levels - OK  <><><><><><><><><><><><><><><><><><><><><><><><><><><><><><><><><> <><><><><><><><><><><><><><><><><><><><><><><><><><><><><><><><><>  -  All Else - CBC - Kidneys - Electrolytes - Liver - Magnesium & Thyroid    - all  Normal / OK  <><><><><><><><><><><><><><><><><><><><><><><><><><><><><><><><><> <><><><><><><><><><><><><><><><><><><><><><><><><><><><><><><><><>  -

## 2022-10-17 ENCOUNTER — Other Ambulatory Visit: Payer: Self-pay | Admitting: Nurse Practitioner

## 2022-10-17 DIAGNOSIS — I10 Essential (primary) hypertension: Secondary | ICD-10-CM

## 2022-10-24 ENCOUNTER — Ambulatory Visit: Payer: Federal, State, Local not specified - PPO | Admitting: Internal Medicine

## 2022-11-01 ENCOUNTER — Encounter: Payer: Self-pay | Admitting: Nurse Practitioner

## 2022-11-01 ENCOUNTER — Ambulatory Visit (INDEPENDENT_AMBULATORY_CARE_PROVIDER_SITE_OTHER): Payer: Federal, State, Local not specified - PPO | Admitting: Nurse Practitioner

## 2022-11-01 ENCOUNTER — Other Ambulatory Visit: Payer: Self-pay

## 2022-11-01 VITALS — BP 132/60 | HR 58 | Temp 99.7°F | Ht 62.75 in | Wt 146.6 lb

## 2022-11-01 DIAGNOSIS — I10 Essential (primary) hypertension: Secondary | ICD-10-CM

## 2022-11-01 DIAGNOSIS — R6889 Other general symptoms and signs: Secondary | ICD-10-CM | POA: Diagnosis not present

## 2022-11-01 DIAGNOSIS — J0101 Acute recurrent maxillary sinusitis: Secondary | ICD-10-CM

## 2022-11-01 DIAGNOSIS — Z1152 Encounter for screening for COVID-19: Secondary | ICD-10-CM

## 2022-11-01 LAB — POCT INFLUENZA A/B
Influenza A, POC: NEGATIVE
Influenza B, POC: NEGATIVE

## 2022-11-01 LAB — POC COVID19 BINAXNOW: SARS Coronavirus 2 Ag: NEGATIVE

## 2022-11-01 MED ORDER — AMOXICILLIN-POT CLAVULANATE 875-125 MG PO TABS: 1.0000 | ORAL_TABLET | Freq: Two times a day (BID) | ORAL | 0 refills | Status: DC

## 2022-11-01 MED ORDER — DEXAMETHASONE 1 MG PO TABS: ORAL_TABLET | ORAL | 0 refills | Status: DC

## 2022-11-01 NOTE — Progress Notes (Signed)
Assessment and Plan:  Tracy Paul was seen today for acute visit.  Diagnoses and all orders for this visit:  Flu-like symptoms -     POCT Influenza A/B  - negative  Encounter for screening for COVID-19 -     POC COVID-19 -negative  Essential hypertension - continue medications, DASH diet, exercise and monitor at home. Call if greater than 130/80.   Acute recurrent maxillary sinusitis Continue Mucinex Dm, push fluids, alternate Tylenol and Advil If no improvement in symptoms in the next 72 hours notify the office -     amoxicillin-clavulanate (AUGMENTIN) 875-125 MG tablet; Take 1 tablet by mouth 2 (two) times daily. 7 days -     dexamethasone (DECADRON) 1 MG tablet; Take 3 tabs for 3 days, 2 tabs for 3 days 1 tab for 5 days. Take with food.       Further disposition pending results of labs. Discussed med's effects and SE's.   Over 30 minutes of exam, counseling, chart review, and critical decision making was performed.   Future Appointments  Date Time Provider [place]  01/03/2023 11:00 AM [name], MD [facility] None  05/07/2023 10:30 AM Darrol Jump, NP [facility] None    ------------------------------------------------------------------------------------------------------------------   HPI BP 132/60   Pulse (!) 58   Temp 99.7 F (37.6 C)   Ht 5' 2.75" (1.594 m)   Wt 146 lb 9.6 oz (66.5 kg)   LMP  (LMP Unknown)   SpO2 97%   BMI 26.18 kg/m    71 y.o.female presents for runny nose, chills, sore throat, headaches, productive yellow cough, sinus pressure x 3 days.  Has tried Mucinex and allergy nasal spray.  Denies nausea, vomiting, diarrhea and fever. Covid and flu are both negative at office today. Has a history of recurrent sinus infections  BP is currently well controlled with Olmesartan 20 mg QD.  Denies chest pain, dizziness.  BP Readings from Last 3 Encounters:  11/01/22 132/60  09/27/22 136/78  07/04/22 (!) 150/74    BMI is Body mass index  is 26.18 kg/m., she has been working on diet and exercise. Wt Readings from Last 3 Encounters:  11/01/22 146 lb 9.6 oz (66.5 kg)  09/27/22 145 lb 6.4 oz (66 kg)  07/04/22 148 lb 12.8 oz (67.5 kg)     Past Medical History:  Diagnosis Date   Allergic rhinitis    Arthritis    Cholecystitis, chronic    Dysrhythmia    Hx bradycardia - hx normal cardiac cath x2 / ECHO 2009 EF 60% LAE MYOVIEW 3/12 EF 60% NO ISCHEMIA   Gastrointestinal ulcer due to Helicobacter pylori 83/15/1761   Hyperlipidemia    Hypertension    no treatment after 120 lb weight loss   Hypothyroidism    Prediabetes    Thyroid nodule      Allergies  Allergen Reactions   Codeine Other (See Comments)    "puts me out of it".  Dysphoria.  Patient tolerates Tramadol without complications.   Flagyl [Metronidazole] Hives   Nabumetone     GI upset    Current Outpatient Medications on File Prior to Visit  Medication Sig   Ascorbic Acid (VITAMIN C) 500 MG CAPS Take 1 tablet by mouth daily.    aspirin 81 MG tablet Take 81 mg by mouth daily.   atorvastatin (LIPITOR) 20 MG tablet TAKE 1 TABLET BY MOUTH ONCE DAILY FOR CHOLESTEROL   Calcium Citrate-Vitamin D (CALCIUM CITRATE + D3 PO) Take 600 mg by mouth daily.  Dextromethorphan-guaiFENesin (Bee DM PO) Take by mouth.   diphenhydramine-acetaminophen (TYLENOL PM) 25-500 MG TABS tablet Take 1 tablet by mouth at bedtime as needed.   escitalopram (LEXAPRO) 5 MG tablet Take 1 tab daily for mood.   esomeprazole (NEXIUM) 40 MG capsule Take 1 capsule Daily for Heartburn & Indigestion   Ferrous Sulfate (SLOW RELEASE IRON PO) Take 1 tablet by mouth daily.    fish oil-omega-3 fatty acids 1000 MG capsule Take 1 g by mouth daily.   fluticasone (FLONASE) 50 MCG/ACT nasal spray Place 2 sprays into both nostrils daily as needed for allergies.   FLUTICASONE FUROATE NA Place into the nose.   gabapentin (NEURONTIN) 100 MG capsule Take 1 capsule 4 x /day for Neuropathy pain    levothyroxine (EUTHYROX) 50 MCG tablet Take  1 tablet  Daily  on an empty stomach with only water for 30 minutes & no Antacid meds, Calcium or Magnesium for 4 hours & avoid Biotin   Magnesium 250 MG TABS Take 1 tablet by mouth daily.    Melatonin 10 MG TABS Take 1 tablet by mouth at bedtime.   nitroGLYCERIN (NITROSTAT) 0.4 MG SL tablet Place 1 tablet (0.4 mg total) under the tongue every 5 (five) minutes as needed for chest pain.   olmesartan (BENICAR) 20 MG tablet Take 1 tablet by mouth once daily for blood pressure   potassium chloride (K-DUR) 10 MEQ tablet Take 1 tablet 2 x /day   rOPINIRole (REQUIP) 3 MG tablet Take  1 tablet   3 x /day  as needed  for Restless Legs   vitamin B-12 (CYANOCOBALAMIN) 1000 MCG tablet Take 1,000 mcg by mouth daily.    Vitamin D, Ergocalciferol, (DRISDOL) 1.25 MG (50000 UNIT) CAPS capsule Take 1 capsule twice weekly for Severe Vitamin D Deficiency   sucralfate (CARAFATE) 1 g tablet Take 1 tablet (1 g total) by mouth 4 (four) times daily.   [DISCONTINUED] olmesartan-hydrochlorothiazide (BENICAR HCT) 20-12.5 MG tablet Take 1 tablet Daily for BP   No current facility-administered medications on file prior to visit.    ROS: all negative except above.   Physical Exam:  BP 132/60   Pulse (!) 58   Temp 99.7 F (37.6 C)   Ht 5' 2.75" (1.594 m)   Wt 146 lb 9.6 oz (66.5 kg)   LMP  (LMP Unknown)   SpO2 97%   BMI 26.18 kg/m   General Appearance: Well nourished, in no apparent distress. Eyes: PERRLA, EOMs, conjunctiva no swelling or erythema Sinuses: Positive maxillary tenderness bilaterally ENT/Mouth: Ext aud canals clear, TMs dull without erythema, bulging. No erythema, swelling, or exudate on post pharynx.   Hearing normal.  Neck: Supple, thyroid normal.  Respiratory: Respiratory effort normal, BS equal bilaterally without rales, rhonchi, wheezing or stridor.  Cardio: RRR with no MRGs. Brisk peripheral pulses without edema.  Abdomen: Soft, + BS.  Non tender,  no guarding, rebound, hernias, masses. Lymphatics: Positive submandibular adenopathy Musculoskeletal: Full ROM, 5/5 strength, normal gait.  Skin: Warm, dry without rashes, lesions, ecchymosis.  Neuro: Cranial nerves intact. Normal muscle tone, no cerebellar symptoms. Sensation intact.  Psych: Awake and oriented X 3, normal affect, Insight and Judgment appropriate.     Alycia Rossetti, NP 2:40 PM [facility]

## 2022-11-01 NOTE — Patient Instructions (Signed)

## 2022-11-06 ENCOUNTER — Encounter: Payer: Self-pay | Admitting: Nurse Practitioner

## 2022-12-09 ENCOUNTER — Other Ambulatory Visit: Payer: Self-pay | Admitting: Nurse Practitioner

## 2022-12-09 DIAGNOSIS — E785 Hyperlipidemia, unspecified: Secondary | ICD-10-CM

## 2022-12-13 ENCOUNTER — Other Ambulatory Visit: Payer: Self-pay

## 2022-12-13 ENCOUNTER — Encounter: Payer: Self-pay | Admitting: Nurse Practitioner

## 2022-12-13 ENCOUNTER — Ambulatory Visit (INDEPENDENT_AMBULATORY_CARE_PROVIDER_SITE_OTHER): Payer: Federal, State, Local not specified - PPO | Admitting: Nurse Practitioner

## 2022-12-13 VITALS — BP 132/68 | HR 67 | Temp 97.9°F | Ht 62.75 in | Wt 150.4 lb

## 2022-12-13 DIAGNOSIS — I10 Essential (primary) hypertension: Secondary | ICD-10-CM

## 2022-12-13 DIAGNOSIS — Z1152 Encounter for screening for COVID-19: Secondary | ICD-10-CM

## 2022-12-13 DIAGNOSIS — R6889 Other general symptoms and signs: Secondary | ICD-10-CM

## 2022-12-13 DIAGNOSIS — J069 Acute upper respiratory infection, unspecified: Secondary | ICD-10-CM

## 2022-12-13 LAB — POC COVID19 BINAXNOW: SARS Coronavirus 2 Ag: NEGATIVE

## 2022-12-13 LAB — POCT INFLUENZA A/B
Influenza A, POC: NEGATIVE
Influenza B, POC: NEGATIVE

## 2022-12-13 MED ORDER — PROMETHAZINE-DM 6.25-15 MG/5ML PO SYRP: 5.0000 mL | ORAL_SOLUTION | Freq: Four times a day (QID) | ORAL | 1 refills | Status: DC | PRN

## 2022-12-13 MED ORDER — AZITHROMYCIN 250 MG PO TABS: ORAL_TABLET | ORAL | 1 refills | Status: DC

## 2022-12-13 MED ORDER — PREDNISONE 20 MG PO TABS: ORAL_TABLET | ORAL | 0 refills | Status: AC

## 2022-12-13 NOTE — Patient Instructions (Signed)

## 2022-12-13 NOTE — Progress Notes (Signed)
Assessment and Plan:  Xitlalli was seen today for acute visit.  Diagnoses and all orders for this visit:  Flu-like symptoms -     POCT Influenza A/B- negative  Encounter for screening for COVID-19 -     POC COVID-19- negative  Acute URI Push fluids Continue Mucinex and alternate ibuprofen and Tylenol as needed for body aches and fever If not improved by Monday notify the office -     azithromycin (ZITHROMAX) 250 MG tablet; Take 2 tablets (500 mg) on  Day 1,  followed by 1 tablet (250 mg) once daily on Days 2 through 5. -     predniSONE (DELTASONE) 20 MG tablet; 3 tablets daily with food for 3 days, 2 tabs daily for 3 days, 1 tab a day for 5 days. -     promethazine-dextromethorphan (PROMETHAZINE-DM) 6.25-15 MG/5ML syrup; Take 5 mLs by mouth 4 (four) times daily as needed for cough.  Essential hypertension - continue medications, DASH diet, exercise and monitor at home. Call if greater than 130/80.          Further disposition pending results of labs. Discussed med's effects and SE's.   Over 30 minutes of exam, counseling, chart review, and critical decision making was performed.   Future Appointments  Date Time Provider [place]  01/03/2023 11:00 AM [name], MD [facility] None  05/07/2023 10:30 AM Darrol Jump, NP [facility] None    ------------------------------------------------------------------------------------------------------------------   HPI BP 132/68   Pulse 67   Temp 97.9 F (36.6 C)   Ht 5' 2.75" (1.594 m)   Wt 150 lb 6.4 oz (68.2 kg)   LMP  (LMP Unknown)   SpO2 95%   BMI 26.85 kg/m   72 y.o.female presents for [facility] green productive cough, fever/chills, headache, head congestion of green mucus, nausea.  Denies vomiting, diarrhea and body aches.  Symptoms have been for 9 days. She has been using Aleve and Mucinex.  Denies covid and flu. Grandson tested positive for flu 3 days ago.   BP is currently well controlled with Olmesartan 20  mg 1 PO QD. Denies chest pain, shortness of breath and dizziness BP Readings from Last 3 Encounters:  12/13/22 132/68  11/01/22 132/60  09/27/22 136/78     Past Medical History:  Diagnosis Date   Allergic rhinitis    Arthritis    Cholecystitis, chronic    Dysrhythmia    Hx bradycardia - hx normal cardiac cath x2 / ECHO 2009 EF 60% LAE MYOVIEW 3/12 EF 60% NO ISCHEMIA   Gastrointestinal ulcer due to Helicobacter pylori 0000000   Hyperlipidemia    Hypertension    no treatment after 120 lb weight loss   Hypothyroidism    Prediabetes    Thyroid nodule      Allergies  Allergen Reactions   Codeine Other (See Comments)    "puts me out of it".  Dysphoria.  Patient tolerates Tramadol without complications.   Flagyl [Metronidazole] Hives   Nabumetone     GI upset    Current Outpatient Medications on File Prior to Visit  Medication Sig   Ascorbic Acid (VITAMIN C) 500 MG CAPS Take 1 tablet by mouth daily.    aspirin 81 MG tablet Take 81 mg by mouth daily.   atorvastatin (LIPITOR) 20 MG tablet TAKE 1 TABLET BY MOUTH ONCE DAILY FOR CHOLESTEROL   Calcium Citrate-Vitamin D (CALCIUM CITRATE + D3 PO) Take 600 mg by mouth daily.    Dextromethorphan-guaiFENesin (Cade DM PO) Take by mouth.  diphenhydramine-acetaminophen (TYLENOL PM) 25-500 MG TABS tablet Take 1 tablet by mouth at bedtime as needed.   escitalopram (LEXAPRO) 5 MG tablet Take 1 tab daily for mood.   esomeprazole (NEXIUM) 40 MG capsule Take 1 capsule Daily for Heartburn & Indigestion   Ferrous Sulfate (SLOW RELEASE IRON PO) Take 1 tablet by mouth daily.    fish oil-omega-3 fatty acids 1000 MG capsule Take 1 g by mouth daily.   fluticasone (FLONASE) 50 MCG/ACT nasal spray Place 2 sprays into both nostrils daily as needed for allergies.   FLUTICASONE FUROATE NA Place into the nose.   gabapentin (NEURONTIN) 100 MG capsule Take 1 capsule 4 x /day for Neuropathy pain   levothyroxine (EUTHYROX) 50 MCG tablet Take  1 tablet   Daily  on an empty stomach with only water for 30 minutes & no Antacid meds, Calcium or Magnesium for 4 hours & avoid Biotin   Magnesium 250 MG TABS Take 1 tablet by mouth daily.    Melatonin 10 MG TABS Take 1 tablet by mouth at bedtime.   Naproxen Sodium (ALEVE PO) Take by mouth.   nitroGLYCERIN (NITROSTAT) 0.4 MG SL tablet Place 1 tablet (0.4 mg total) under the tongue every 5 (five) minutes as needed for chest pain.   olmesartan (BENICAR) 20 MG tablet Take 1 tablet by mouth once daily for blood pressure   potassium chloride (K-DUR) 10 MEQ tablet Take 1 tablet 2 x /day   rOPINIRole (REQUIP) 3 MG tablet Take  1 tablet   3 x /day  as needed  for Restless Legs   vitamin B-12 (CYANOCOBALAMIN) 1000 MCG tablet Take 1,000 mcg by mouth daily.    Vitamin D, Ergocalciferol, (DRISDOL) 1.25 MG (50000 UNIT) CAPS capsule Take 1 capsule twice weekly for Severe Vitamin D Deficiency   amoxicillin-clavulanate (AUGMENTIN) 875-125 MG tablet Take 1 tablet by mouth 2 (two) times daily. 7 days (Patient not taking: Reported on 12/13/2022)   dexamethasone (DECADRON) 1 MG tablet Take 3 tabs for 3 days, 2 tabs for 3 days 1 tab for 5 days. Take with food. (Patient not taking: Reported on 12/13/2022)   sucralfate (CARAFATE) 1 g tablet Take 1 tablet (1 g total) by mouth 4 (four) times daily.   [DISCONTINUED] olmesartan-hydrochlorothiazide (BENICAR HCT) 20-12.5 MG tablet Take 1 tablet Daily for BP   No current facility-administered medications on file prior to visit.    ROS: all negative except above.   Physical Exam:  BP 132/68   Pulse 67   Temp 97.9 F (36.6 C)   Ht 5' 2.75" (1.594 m)   Wt 150 lb 6.4 oz (68.2 kg)   LMP  (LMP Unknown)   SpO2 95%   BMI 26.85 kg/m   General Appearance: Well nourished, in no apparent distress. Eyes: PERRLA, EOMs, conjunctiva no swelling or erythema Sinuses: Positive maxillary tenderness ENT/Mouth: Ext aud canals clear, TMs dull with fluid, no erythema or drainage. No erythema,  swelling, or exudate on post pharynx. Hearing normal.  Neck: Supple, thyroid normal.  Respiratory: Respiratory effort normal, BS equal bilaterally without rales, rhonchi, wheezing or stridor.  Cardio: RRR with no MRGs. Brisk peripheral pulses without edema.  Abdomen: Soft, + BS.  Non tender, no guarding, rebound, hernias, masses. Lymphatics: tender left submandibular adenopathy Musculoskeletal: Full ROM, 5/5 strength, normal gait.  Skin: Warm, dry without rashes, lesions, ecchymosis.  Neuro: Cranial nerves intact. Normal muscle tone, no cerebellar symptoms. Sensation intact.  Psych: Awake and oriented X 3, normal affect, Insight and Judgment  appropriate.     Alycia Rossetti, NP 10:57 AM [facility]

## 2022-12-17 ENCOUNTER — Other Ambulatory Visit: Payer: Self-pay | Admitting: Nurse Practitioner

## 2022-12-17 ENCOUNTER — Telehealth: Payer: Self-pay | Admitting: Nurse Practitioner

## 2022-12-17 DIAGNOSIS — J069 Acute upper respiratory infection, unspecified: Secondary | ICD-10-CM

## 2022-12-17 MED ORDER — AMOXICILLIN 500 MG PO TABS: 500.0000 mg | ORAL_TABLET | Freq: Three times a day (TID) | ORAL | 0 refills | Status: DC

## 2022-12-17 NOTE — Telephone Encounter (Signed)
Please advise patient that I have sent in another antibiotic- Amoxicillin Three times a day for 10 days. She should be using Mucinex twice a day for decongestant and complete course of steroids

## 2022-12-17 NOTE — Telephone Encounter (Signed)
Spoke with patient and gave recommendations and she will let us know if she is not doing better after new antibiotic.

## 2022-12-17 NOTE — Telephone Encounter (Signed)
Patient states that she isn't feeling any better. Still has a headache, sore throat, and says that she can taste the infection in her phlegm. Please advise.

## 2022-12-21 ENCOUNTER — Encounter: Payer: Federal, State, Local not specified - PPO | Admitting: Internal Medicine

## 2023-01-03 ENCOUNTER — Ambulatory Visit (INDEPENDENT_AMBULATORY_CARE_PROVIDER_SITE_OTHER): Payer: Federal, State, Local not specified - PPO | Admitting: Internal Medicine

## 2023-01-03 ENCOUNTER — Encounter: Payer: Self-pay | Admitting: Internal Medicine

## 2023-01-03 VITALS — BP 136/70 | HR 61 | Temp 97.9°F | Resp 16 | Ht 62.75 in | Wt 144.4 lb

## 2023-01-03 DIAGNOSIS — Z903 Acquired absence of stomach [part of]: Secondary | ICD-10-CM

## 2023-01-03 DIAGNOSIS — Z0000 Encounter for general adult medical examination without abnormal findings: Secondary | ICD-10-CM | POA: Diagnosis not present

## 2023-01-03 DIAGNOSIS — Z8249 Family history of ischemic heart disease and other diseases of the circulatory system: Secondary | ICD-10-CM

## 2023-01-03 DIAGNOSIS — Z0001 Encounter for general adult medical examination with abnormal findings: Secondary | ICD-10-CM

## 2023-01-03 DIAGNOSIS — I10 Essential (primary) hypertension: Secondary | ICD-10-CM

## 2023-01-03 DIAGNOSIS — E559 Vitamin D deficiency, unspecified: Secondary | ICD-10-CM

## 2023-01-03 DIAGNOSIS — D508 Other iron deficiency anemias: Secondary | ICD-10-CM

## 2023-01-03 DIAGNOSIS — Z13220 Encounter for screening for lipoid disorders: Secondary | ICD-10-CM

## 2023-01-03 DIAGNOSIS — N182 Chronic kidney disease, stage 2 (mild): Secondary | ICD-10-CM

## 2023-01-03 DIAGNOSIS — E1169 Type 2 diabetes mellitus with other specified complication: Secondary | ICD-10-CM

## 2023-01-03 DIAGNOSIS — Z1211 Encounter for screening for malignant neoplasm of colon: Secondary | ICD-10-CM

## 2023-01-03 DIAGNOSIS — E1122 Type 2 diabetes mellitus with diabetic chronic kidney disease: Secondary | ICD-10-CM

## 2023-01-03 DIAGNOSIS — Z79899 Other long term (current) drug therapy: Secondary | ICD-10-CM

## 2023-01-03 DIAGNOSIS — Z130 Encounter for screening for diseases of the blood and blood-forming organs and certain disorders involving the immune mechanism: Secondary | ICD-10-CM

## 2023-01-03 DIAGNOSIS — E039 Hypothyroidism, unspecified: Secondary | ICD-10-CM

## 2023-01-03 DIAGNOSIS — Z136 Encounter for screening for cardiovascular disorders: Secondary | ICD-10-CM

## 2023-01-03 DIAGNOSIS — K9189 Other postprocedural complications and disorders of digestive system: Secondary | ICD-10-CM

## 2023-01-03 DIAGNOSIS — Z131 Encounter for screening for diabetes mellitus: Secondary | ICD-10-CM | POA: Diagnosis not present

## 2023-01-03 DIAGNOSIS — I700 Atherosclerosis of aorta: Secondary | ICD-10-CM

## 2023-01-03 DIAGNOSIS — F17200 Nicotine dependence, unspecified, uncomplicated: Secondary | ICD-10-CM

## 2023-01-03 DIAGNOSIS — Z1389 Encounter for screening for other disorder: Secondary | ICD-10-CM

## 2023-01-03 DIAGNOSIS — I2510 Atherosclerotic heart disease of native coronary artery without angina pectoris: Secondary | ICD-10-CM

## 2023-01-03 DIAGNOSIS — D518 Other vitamin B12 deficiency anemias: Secondary | ICD-10-CM

## 2023-01-03 NOTE — Patient Instructions (Signed)

## 2023-01-03 NOTE — Progress Notes (Signed)
Annual Screening/Preventative Visit & Comprehensive Evaluation &  Examination   Future Appointments  Date Time Provider Department  01/03/2023                             cpe 11:00 AM [name], MD [facility]  05/07/2023                              wellness 10:30 AM Darrol Jump, NP [facility]  01/09/2024                          cpe 11:00 AM [name], MD [facility]        This very nice 72 y.o.  MWF presents for a Screening /Preventative Visit & comprehensive evaluation and management of multiple medical co-morbidities.  Patient has been followed for HTN, HLD, T2_NIDDM  and Vitamin D Deficiency.   Abd CT scan in 2018 showed Aortic Atherosclerosis.        HTN predates circa 1995. Patient's BP has been controlled at home. Patient has had 2 Negative Heart Caths ( 1995 & 2000).  In 2019 , she was hospitalized with Chest pain & MI was ruled out. Cardiac CTA ruled out high risk CAD.  Patient denies any cardiac symptoms as chest pain, palpitations, shortness of breath, dizziness or ankle swelling. Today's BP is at goal -   136/70 .        Patient's hyperlipidemia is controlled with diet and medications. Patient denies myalgias or other medication SE's. 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 Now her weight is down to 144 #  ( down 134 # ).    Since her weight loss, she has remained off of Diabetic meds with normal A1c's.  Patient denies reactive hypoglycemic symptoms, visual blurring, diabetic polys or paresthesias. Last A1c was normal & at goal :  Lab Results  Component Value Date   HGBA1C 5.7 (H) 09/27/2022         Finally, patient has history of Vitamin D Deficiency  ("28" /2008) and last  Vitamin D was at goal :  Lab Results  Component Value Date   VD25OH 80 09/27/2022      Current Outpatient Medications  Medication Instructions   VITAMIN C 500 MG CAPS 1 tablet, Oral, Daily   Aspirin  81 mg , Daily   atorvastatin 20 MG tablet TAKE 1 TABLET  DAILY    Calcium Citrate-Vit D 600 mg, Oral, Daily   VITAMIN B12 1,000 mcg, Oral, Daily   TYLENOL PM 25-500 MG 1 tablet, Oral, At bedtime PRN   escitalopram  5 MG tablet Take 1 tab daily for mood.   esomeprazole  40 MG capsule Take 1 capsule Daily    SLOW RELEASE IRON  1 tablet, Oral, Daily   fish oil-omega-3 fatty acids 1 g, Daily   FLONASE  nasal spray 2 sprays, Each Nare, Daily PRN   FLUTICASONE FUROATE NA Nasal   gabapentin  100 MG capsule Take  1 capsule 4 x /day for Neuropathy pain   levothyroxine 50 MCG tablet Take  1 tablet  Daily    Magnesium 250 MG TABS 1 tablet, Oral, Daily   Melatonin 10 MG TABS 1 tablet, Oral, Daily at bedtime   Naproxen Sodium (ALEVE PO) Oral   nitroGLYCERIN (NITROSTAT) 0.4 mg, Sublingual, Every 5 min PRN   olmesartan (BENICAR) 20 MG tablet Take 1 tablet by mouth once daily for blood pressure   potassium chloride (K-DUR) 10 MEQ tablet Take 1 tablet 2 x /day   rOPINIRole (REQUIP) 3 MG tablet Take  1 tablet   3 x /day  as needed  for Restless Legs   sucralfate (CARAFATE) 1 g, Oral, 4 times daily   Vitamin D, Ergocalciferol, (DRISDOL) 1.25 MG (50000 UNIT) CAPS capsule Take 1 capsule twice weekly for Severe Vitamin D Deficiency     Allergies  Allergen Reactions   Codeine Other (See Comments)    "puts me out of it".  Dysphoria.  Patient tolerates Tramadol without complications.   Flagyl [Metronidazole] Hives   Nabumetone     GI upset     Past Medical History:  Diagnosis Date   Allergic rhinitis    Arthritis    Cholecystitis, chronic    Dysrhythmia    Hx bradycardia - hx normal cardiac cath x2 / ECHO 2009 EF 60% LAE MYOVIEW 3/12 EF 60% NO ISCHEMIA   Gastrointestinal ulcer due to  Helicobacter pylori 0000000   Hyperlipidemia    Hypertension    no treatment after 120 lb weight loss   Hypothyroidism    Prediabetes    Thyroid nodule      Health Maintenance  Topic Date Due   Zoster Vaccines- Shingrix (1 of 2) Never done   COVID-19 Vaccine (3 - Moderna risk series) 02/24/2020   MAMMOGRAM  05/03/2023   COLONOSCOPY (Pts 45-8yr Insurance coverage will need to be confirmed)  05/29/2023   TETANUS/TDAP  05/23/2026   Pneumonia Vaccine 72 Years old  Completed   INFLUENZA VACCINE  Completed   DEXA SCAN  Completed   Hepatitis C Screening  Completed   HPV VACCINES  Aged Out     Immunization History  Administered Date(s) Administered   Influenza Split 08/25/2013, 09/02/2014, 08/10/2015   Influenza, High Dose  07/23/2019, 08/23/2020, 09/07/2021   Influenza,inj, 08/10/2016   Moderna Sars-Covid-2 Vacc 12/30/2019, 01/27/2020   PPD Test 02/23/2014, 05/23/2016   Pneumococcal -13 07/31/2018   Pneumococcal P-23 10/18/2008, 10/08/2019   Td 08/08/2006   Tdap 05/23/2016   Zoster, Live 10/15/2012    Last Colon - 05/28/2018 - Dr NSilverio Decamp- Recc f/u in 5 years - due Aug 2024   Last MGM - 05/02/2021   Past Surgical History:  Procedure Laterality Date   Bilateral carpal tunnel surgery     BREATH TEK H PYLORI  07/01/2012   Procedure: BREATH TEK H PYLORI;  Surgeon: BEdward Jolly MD;  [facility];  Service: General;  Laterality: N/A;   CHOLECYSTECTOMY N/A 11/27/2013   Procedure: LAPAROSCOPIC CHOLECYSTECTOMY WITH INTRAOPERATIVE CHOLANGIOGRAM;  Surgeon: MPedro Earls MD;  Location: [facility];  Service: General;  Laterality: N/A;   GASTRIC ROUX-EN-Y N/A 12/16/2012   Procedure: LAPAROSCOPIC ROUX-EN-Y GASTRIC BYPASS WITH UPPER ENDOSCOPY;  Surgeon: MPedro Earls MD;  Location: [facility];  Service: General;  Laterality: N/A;  Gastric Bypass   NASAL SINUS SURGERY     REPLACEMENT TOTAL KNEE BILATERAL     bilat   TUBAL LIGATION  Family History   Problem Relation Age of Onset   Breast cancer Mother    Cancer Mother        breast cancer   Anuerysm Mother        brain anuerysm   Heart attack Father 53   Heart attack Sister 45   Breast cancer Paternal Aunt    Breast cancer Cousin    Colon cancer Neg Hx    Stomach cancer Neg Hx    Rectal cancer Neg Hx    Esophageal cancer Neg Hx      Social History   Tobacco Use   Smoking status: Former    Packs/day: 0.80    Years: 10.00    Pack years: 8.00    Types: Cigarettes    Quit date: 10/30/1979    Years since quitting: 42.1   Smokeless tobacco: Never  Vaping Use   Vaping Use: Never used  Substance Use Topics   Alcohol use: No   Drug use: No      ROS Constitutional: Denies fever, chills, weight loss/gain, headaches, insomnia,  night sweats, and change in appetite. Does c/o fatigue. Eyes: Denies redness, blurred vision, diplopia, discharge, itchy, watery eyes.  ENT: Denies discharge, congestion, post nasal drip, epistaxis, sore throat, earache, hearing loss, dental pain, Tinnitus, Vertigo, Sinus pain, snoring.  Cardio: Denies chest pain, palpitations, irregular heartbeat, syncope, dyspnea, diaphoresis, orthopnea, PND, claudication, edema Respiratory: denies cough, dyspnea, DOE, pleurisy, hoarseness, laryngitis, wheezing.  Gastrointestinal: Denies dysphagia, heartburn, reflux, water brash, pain, cramps, nausea, vomiting, bloating, diarrhea, constipation, hematemesis, melena, hematochezia, jaundice, hemorrhoids Genitourinary: Denies dysuria, frequency, urgency, nocturia, hesitancy, discharge, hematuria, flank pain Breast: Breast lumps, nipple discharge, bleeding.  Musculoskeletal: Denies arthralgia, myalgia, stiffness, Jt. Swelling, pain, limp, and strain/sprain. Denies falls. Skin: Denies puritis, rash, hives, warts, acne, eczema, changing in skin lesion Neuro: No weakness, tremor, incoordination, spasms, paresthesia, pain Psychiatric: Denies confusion, memory loss, sensory  loss. Denies Depression. Endocrine: Denies change in weight, skin, hair change, nocturia, and paresthesia, diabetic polys, visual blurring, hyper / hypo glycemic episodes.  Heme/Lymph: No excessive bleeding, bruising, enlarged lymph nodes.  Physical Exam  BP 136/70   Pulse 61   Temp 97.9 F (36.6 C)   Resp 16   Ht 5' 2.75" (1.594 m)   Wt 144 lb 6.4 oz (65.5 kg)    SpO2 98%   BMI 25.78 kg/m   General Appearance: Well nourished, well groomed and in no apparent distress.  Eyes: PERRLA, EOMs, conjunctiva no swelling or erythema, normal fundi and vessels. Sinuses: No frontal/maxillary tenderness ENT/Mouth: EACs patent / TMs  nl. Nares clear without erythema, swelling, mucoid exudates. Oral hygiene is good. No erythema, swelling, or exudate. Tongue normal, non-obstructing. Tonsils not swollen or erythematous. Hearing normal.  Neck: Supple, thyroid not palpable. No bruits, nodes or JVD. Respiratory: Respiratory effort normal.  BS equal and clear bilateral without rales, rhonci, wheezing or stridor. Cardio: Heart sounds are normal with regular rate and rhythm and no murmurs, rubs or gallops. Peripheral pulses are normal and equal bilaterally without edema. No aortic or femoral bruits. Chest: symmetric with normal excursions and percussion. Breasts: Symmetric, without lumps, nipple discharge, retractions, or fibrocystic changes.  Abdomen: Flat, soft with bowel sounds active. Nontender, no guarding, rebound, hernias, masses, or organomegaly.  Lymphatics: Non tender without lymphadenopathy.  Musculoskeletal: Full ROM all peripheral extremities, joint stability, 5/5 strength, and normal gait. Skin: Warm and dry without rashes, lesions, cyanosis, clubbing or  ecchymosis.  Neuro: Cranial nerves intact, reflexes equal bilaterally.  Normal muscle tone, no cerebellar symptoms. Sensation intact.  Pysch: Alert and oriented X 3, normal affect, Insight and Judgment appropriate.    Assessment and  Plan  1. Annual Preventative Screening Examination   2. Essential hypertension  - EKG 12-Lead - Urinalysis, Routine w reflex microscopic - Microalbumin / creatinine urine ratio - CBC with Differential/Platelet - COMPLETE METABOLIC PANEL WITH GFR - Magnesium - TSH  3. Hyperlipidemia associated with type 2 diabetes mellitus ([place])  - EKG 12-Lead - Lipid panel - TSH  4. Type 2 diabetes mellitus with stage 2 chronic kidney  disease, without long-term current use of insulin ([facility])  - EKG 12-Lead - Urinalysis, Routine w reflex microscopic - Microalbumin / creatinine urine ratio - HM DIABETES FOOT EXAM - PR LOW EXTEMITY NEUR EXAM DOCUM - Hemoglobin A1c - Insulin, random  5. Vitamin D deficiency  - VITAMIN D 25 Hydroxy  6. Hypothyroidism, unspecified type  - TSH  7. Aortic atherosclerosis ([place]) by Abd CT scan on 08/01/2017  - EKG 12-Lead - Lipid panel  8. Iron deficiency anemia following bariatric surgery  - Iron, Total/Total Iron Binding Cap  9. 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 Coronary artery disease involving native coronary  artery of native heart without angina pectoris  - EKG 12-Lead - Lipid panel  11. Screening for colorectal cancer  - POC Hemoccult Bld/Stl   12. Screening for ischemic heart disease  - EKG 12-Lead  13. FHx: heart disease  - EKG 12-Lead  14. Smoker  - EKG 12-Lead  15. Medication management  - Urinalysis, Routine w reflex microscopic - Microalbumin / creatinine urine ratio - Iron, Total/Total Iron Binding Cap - Vitamin B12 - CBC with Differential/Platelet - COMPLETE METABOLIC PANEL WITH GFR - Magnesium - Lipid panel - TSH - Hemoglobin A1c - Insulin, random - VITAMIN D 25 Hydroxy          Patient was counseled in prudent diet to achieve/maintain BMI less than 25 for weight control, BP monitoring, regular exercise and medications. Discussed med's effects and SE's. Screening labs and  tests as requested with regular follow-up as recommended. Over 40 minutes of exam, counseling, chart review and high complex critical decision making was performed.   Kirtland Bouchard, MD

## 2023-01-05 NOTE — Progress Notes (Signed)
<><><><><><><><><><><><><><><><><><><><><><><><><><><><><><><><><> <><><><><><><><><><><><><><><><><><><><><><><><><><><><><><><><><> -   Test results slightly outside the reference range are not unusual. If there is anything important, I will review this with you,  otherwise it is considered normal test values.  If you have further questions,  please do not hesitate to contact me at the office or via My Chart.  <><><><><><><><><><><><><><><><><><><><><><><><><><><><><><><><><> <><><><><><><><><><><><><><><><><><><><><><><><><><><><><><><><><>  -   A1c = 5.8% Blood sugar and A1c are elevated  AGAIN in the borderline and                                                           early or pre-diabetes range which has the same  300% increased risk for heart attack, stroke, cancer and                                             alzheimer- type vascular dementia as full blown diabetes.   But the good news is that diet, exercise with weight loss can cure                                                                                        the early diabetes at this point. <><><><><><><><><><><><><><><><><><><><><><><><><><><><><><><><><>   -  Vitamin B12 =   241  is   Very Low  (Ideal or Goal Vit B12 is between 450 - 1,100)   Low Vit B12 may be associated with Anemia , Fatigue,   Peripheral Neuropathy, Dementia, "Brain Fog", & Depression  - Recommend take a sub-lingual form of Vitamin B12 tablet   1,000 to 5,000 mcg tab that you dissolve under your tongue /Daily   - Can get Baron Sane - best price at [employer] or on [employer]  ( Be sure to get the   type that you dissolve in mouth )  <><><><><><><><><><><><><><><><><><><><><><><><><><><><><><><><><> <><><><><><><><><><><><><><><><><><><><><><><><><><><><><><><><><>  -  Iron levels - OK  <><><><><><><><><><><><><><><><><><><><><><><><><><><><><><><><><>  -   Vit D = 66 - Great  - Please keep dosage same   <><><><><><><><><><><><><><><><><><><><><><><><><><><><><><><><><>  -   Chol = 134  & LDL   Chol = 48   both  Excellent   - Very low risk for Heart Attack  / Stroke <><><><><><><><><><><><><><><><><><><><><><><><><><><><><><><><><>  -   All Else - CBC - Kidneys - Electrolytes - Liver - Magnesium & Thyroid    - all  Normal / OK <><><><><><><><><><><><><><><><><><><><><><><><><><><><><><><><><> <><><><><><><><><><><><><><><><><><><><><><><><><><><><><><><><><>                      -

## 2023-01-07 LAB — CBC WITH DIFFERENTIAL/PLATELET
Absolute Monocytes: 382 cells/uL (ref 200–950)
Basophils Absolute: 69 cells/uL (ref 0–200)
Basophils Relative: 1.3 %
Eosinophils Absolute: 249 cells/uL (ref 15–500)
Eosinophils Relative: 4.7 %
HCT: 35.7 % (ref 35.0–45.0)
Hemoglobin: 11.8 g/dL (ref 11.7–15.5)
Lymphs Abs: 2465 cells/uL (ref 850–3900)
MCH: 31 pg (ref 27.0–33.0)
MCHC: 33.1 g/dL (ref 32.0–36.0)
MCV: 93.7 fL (ref 80.0–100.0)
MPV: 11 fL (ref 7.5–12.5)
Monocytes Relative: 7.2 %
Neutro Abs: 2136 cells/uL (ref 1500–7800)
Neutrophils Relative %: 40.3 %
Platelets: 244 10*3/uL (ref 140–400)
RBC: 3.81 10*6/uL (ref 3.80–5.10)
RDW: 13.1 % (ref 11.0–15.0)
Total Lymphocyte: 46.5 %
WBC: 5.3 10*3/uL (ref 3.8–10.8)

## 2023-01-07 LAB — MICROALBUMIN / CREATININE URINE RATIO
Creatinine, Urine: 23 mg/dL (ref 20–275)
Microalb Creat Ratio: 9 mcg/mg creat (ref <30)
Microalb, Ur: 0.2 mg/dL

## 2023-01-07 LAB — HEMOGLOBIN A1C
Hgb A1c MFr Bld: 5.8 % of total Hgb — ABNORMAL HIGH (ref <5.7)
Mean Plasma Glucose: 120 mg/dL
eAG (mmol/L): 6.6 mmol/L

## 2023-01-07 LAB — COMPLETE METABOLIC PANEL WITH GFR
AG Ratio: 1.6 (calc) (ref 1.0–2.5)
ALT: 19 U/L (ref 6–29)
AST: 22 U/L (ref 10–35)
Albumin: 3.9 g/dL (ref 3.6–5.1)
Alkaline phosphatase (APISO): 87 U/L (ref 37–153)
BUN: 13 mg/dL (ref 7–25)
CO2: 26 mmol/L (ref 20–32)
Calcium: 9 mg/dL (ref 8.6–10.4)
Chloride: 107 mmol/L (ref 98–110)
Creat: 0.82 mg/dL (ref 0.60–1.00)
Globulin: 2.5 g/dL (calc) (ref 1.9–3.7)
Glucose, Bld: 89 mg/dL (ref 65–99)
Potassium: 4.2 mmol/L (ref 3.5–5.3)
Sodium: 141 mmol/L (ref 135–146)
Total Bilirubin: 1.8 mg/dL — ABNORMAL HIGH (ref 0.2–1.2)
Total Protein: 6.4 g/dL (ref 6.1–8.1)
eGFR: 77 mL/min/{1.73_m2} (ref >=60)

## 2023-01-07 LAB — URINALYSIS, ROUTINE W REFLEX MICROSCOPIC
Bilirubin Urine: NEGATIVE
Glucose, UA: NEGATIVE
Hgb urine dipstick: NEGATIVE
Ketones, ur: NEGATIVE
Leukocytes,Ua: NEGATIVE
Nitrite: NEGATIVE
Protein, ur: NEGATIVE
Specific Gravity, Urine: 1.005 (ref 1.001–1.035)
pH: 6.5 (ref 5.0–8.0)

## 2023-01-07 LAB — MAGNESIUM: Magnesium: 2 mg/dL (ref 1.5–2.5)

## 2023-01-07 LAB — VITAMIN D 25 HYDROXY (VIT D DEFICIENCY, FRACTURES): Vit D, 25-Hydroxy: 66 ng/mL (ref 30–100)

## 2023-01-07 LAB — LIPID PANEL
Cholesterol: 134 mg/dL (ref <200)
HDL: 73 mg/dL (ref >=50)
LDL Cholesterol (Calc): 48 mg/dL (calc)
Non-HDL Cholesterol (Calc): 61 mg/dL (calc) (ref <130)
Total CHOL/HDL Ratio: 1.8 (calc) (ref <5.0)
Triglycerides: 46 mg/dL (ref <150)

## 2023-01-07 LAB — IRON, TOTAL/TOTAL IRON BINDING CAP
%SAT: 28 % (calc) (ref 16–45)
Iron: 93 ug/dL (ref 45–160)
TIBC: 330 mcg/dL (calc) (ref 250–450)

## 2023-01-07 LAB — VITAMIN B12: Vitamin B-12: 241 pg/mL (ref 200–1100)

## 2023-01-07 LAB — TSH: TSH: 1.61 mIU/L (ref 0.40–4.50)

## 2023-01-07 LAB — METHYLMALONIC ACID, SERUM: Methylmalonic Acid, Quant: 442 nmol/L — ABNORMAL HIGH (ref 87–318)

## 2023-01-28 ENCOUNTER — Other Ambulatory Visit: Payer: Self-pay | Admitting: Internal Medicine

## 2023-01-28 DIAGNOSIS — E1140 Type 2 diabetes mellitus with diabetic neuropathy, unspecified: Secondary | ICD-10-CM

## 2023-01-28 DIAGNOSIS — E559 Vitamin D deficiency, unspecified: Secondary | ICD-10-CM

## 2023-01-28 DIAGNOSIS — Z1211 Encounter for screening for malignant neoplasm of colon: Secondary | ICD-10-CM

## 2023-01-28 MED ORDER — VITAMIN D (ERGOCALCIFEROL) 1.25 MG (50000 UNIT) PO CAPS: ORAL_CAPSULE | ORAL | 3 refills | Status: DC

## 2023-01-28 MED ORDER — GABAPENTIN 100 MG PO CAPS: ORAL_CAPSULE | ORAL | 3 refills | Status: DC

## 2023-05-07 ENCOUNTER — Ambulatory Visit: Payer: Federal, State, Local not specified - PPO | Admitting: Nurse Practitioner

## 2023-05-07 ENCOUNTER — Encounter: Payer: Self-pay | Admitting: Nurse Practitioner

## 2023-05-07 VITALS — BP 140/82 | HR 49 | Temp 97.5°F | Ht 62.75 in | Wt 147.2 lb

## 2023-05-07 DIAGNOSIS — E559 Vitamin D deficiency, unspecified: Secondary | ICD-10-CM

## 2023-05-07 DIAGNOSIS — E1122 Type 2 diabetes mellitus with diabetic chronic kidney disease: Secondary | ICD-10-CM

## 2023-05-07 DIAGNOSIS — K219 Gastro-esophageal reflux disease without esophagitis: Secondary | ICD-10-CM

## 2023-05-07 DIAGNOSIS — I10 Essential (primary) hypertension: Secondary | ICD-10-CM

## 2023-05-07 DIAGNOSIS — K9189 Other postprocedural complications and disorders of digestive system: Secondary | ICD-10-CM

## 2023-05-07 DIAGNOSIS — N182 Chronic kidney disease, stage 2 (mild): Secondary | ICD-10-CM

## 2023-05-07 DIAGNOSIS — E1140 Type 2 diabetes mellitus with diabetic neuropathy, unspecified: Secondary | ICD-10-CM

## 2023-05-07 DIAGNOSIS — F418 Other specified anxiety disorders: Secondary | ICD-10-CM

## 2023-05-07 DIAGNOSIS — E039 Hypothyroidism, unspecified: Secondary | ICD-10-CM | POA: Diagnosis not present

## 2023-05-07 DIAGNOSIS — E785 Hyperlipidemia, unspecified: Secondary | ICD-10-CM

## 2023-05-07 DIAGNOSIS — J0100 Acute maxillary sinusitis, unspecified: Secondary | ICD-10-CM

## 2023-05-07 DIAGNOSIS — E1169 Type 2 diabetes mellitus with other specified complication: Secondary | ICD-10-CM

## 2023-05-07 DIAGNOSIS — Z79899 Other long term (current) drug therapy: Secondary | ICD-10-CM

## 2023-05-07 DIAGNOSIS — D508 Other iron deficiency anemias: Secondary | ICD-10-CM

## 2023-05-07 DIAGNOSIS — R001 Bradycardia, unspecified: Secondary | ICD-10-CM

## 2023-05-07 DIAGNOSIS — I700 Atherosclerosis of aorta: Secondary | ICD-10-CM

## 2023-05-07 DIAGNOSIS — I2510 Atherosclerotic heart disease of native coronary artery without angina pectoris: Secondary | ICD-10-CM

## 2023-05-07 LAB — CBC WITH DIFFERENTIAL/PLATELET
Absolute Monocytes: 427 cells/uL (ref 200–950)
Basophils Absolute: 67 cells/uL (ref 0–200)
Eosinophils Absolute: 326 cells/uL (ref 15–500)
Hemoglobin: 12.2 g/dL (ref 11.7–15.5)
Lymphs Abs: 2227 cells/uL (ref 850–3900)
MCH: 30.4 pg (ref 27.0–33.0)
MCHC: 33 g/dL (ref 32.0–36.0)
Monocytes Relative: 8.9 %
RBC: 4.01 10*6/uL (ref 3.80–5.10)
Total Lymphocyte: 46.4 %

## 2023-05-07 NOTE — Progress Notes (Signed)
FOLLOW UP  Tracy Paul is seen today for a general follow up.  Below references diagnostics and plan of care:  Assessment and Plan:   Hypertension/Bradycardia Discussed DASH (Dietary Approaches to Stop Hypertension) DASH diet is lower in sodium than a typical American diet. Cut back on foods that are high in saturated fat, cholesterol, and trans fats. Eat more whole-grain foods, fish, poultry, and nuts Remain active and exercise as tolerated daily.  Monitor BP at home-Call if greater than 130/80.  Check CMP/CBC  Hyperlipidemia Discussed lifestyle modifications. Recommended diet heavy in fruits and veggies, omega 3's. Decrease consumption of animal meats, cheeses, and dairy products. Remain active and exercise as tolerated. Continue to monitor. Check lipids/TSH   Diabetes Type 2 Education: Reviewed 'ABCs' of diabetes management  Discussed goals to be met and/or maintained include A1C (<7) Blood pressure (<130/80) Cholesterol (LDL <70) Continue Eye Exam yearly  Continue Dental Exam Q6 mo Discussed dietary recommendations Discussed Physical Activity recommendations Check A1C  Hypothyroidism Continue Levothyroxine. Reminded to take on an empty stomach 30-71mins before food.  Stop any Biotin Supplement 48-72 hours before next TSH level to reduce the risk of falsely low TSH levels. Continue to monitor.    CAD/Aortic Atherosclerotics Keep BG and BP well controlled Monitor cholesterol Remain active and monitor weight  Iron deficiency anemia Monitor CBC Discussed iron rich foods.  GERD No suspected reflux complications (Barret/stricture). Lifestyle modification:  wt loss, avoid meals 2-3h before bedtime. Consider eliminating food triggers:  chocolate, caffeine, EtOH, acid/spicy food.  Vitamin D deficiency Continue supplement for goal of 60-100 Monitor Vitamin D levels  Depression with anxiety Continue Lexapro Reviewed relaxation techniques.  Sleep  hygiene. Recommended Cognitive Behavioral Therapy (CBT) PRN Encouraged personality growth wand development through coping techniques and problem-solving skills. Limit/Decrease/Monitor drug/alcohol intake.    Diabetic neuropathy Keep BG well controlled Inspect feet daily Good shoe support. Remain active to help promote circulation  Recurrent sinusitis Has followed with ENT - surgery optional but patient defers. Discussed importance of daily antihistamine  Zyrtec 10 mg sample provided. Discussed benefit of local honey. Avoid triggers  Medication management All medications discussed and reviewed in full. All questions and concerns regarding medications addressed.    Orders Placed This Encounter  Procedures   CBC with Differential/Platelet   COMPLETE METABOLIC PANEL WITH GFR   TSH   Hemoglobin A1C w/out eAG    Notify office for further evaluation and treatment, questions or concerns if any reported s/s fail to improve.   The patient was advised to call back or seek an in-person evaluation if any symptoms worsen or if the condition fails to improve as anticipated.   Further disposition pending results of labs. Discussed med's effects and SE's.    I discussed the assessment and treatment plan with the patient. The patient was provided an opportunity to ask questions and all were answered. The patient agreed with the plan and demonstrated an understanding of the instructions.  Discussed med's effects and SE's. Screening labs and tests as requested with regular follow-up as recommended.  I provided 25 minutes of face-to-face time during this encounter including counseling, chart review, and critical decision making was preformed.  Today's Plan of Care is based on a patient-centered health care approach known as shared decision making - the decisions, tests and treatments allow for patient preferences and values to be balanced with clinical evidence.      Future Appointments   Date Time Provider Department Center  10/03/2023  2:30 PM Adela Glimpse,  NP [facility] None  01/09/2024 11:00 AM [name], MD [facility] None    ----------------------------------------------------------------------------------------------------------------------  HPI 72 y.o. female  presents for 3 month follow up on hypertension, cholesterol, diabetes, weight and vitamin D deficiency.   Overall she reports feeling well today.  She has noticed increase in dizziness with nausea upon standing or during positional changes.  She has a hx of recurrent sinusitis which she does not currently treat with medications other than PRN Flonase.  Reports consistent PND with nasal congestion and feeling of fullness in ears and head.  She is established with Otolaryngology, Dr. Ernestene Kiel and recently followed up 08/2022.  Was told that surgery was an option but patient defers at this time.  BMI is Body mass index is 26.28 kg/m., she has not been working on diet and exercise. Wt Readings from Last 3 Encounters:  05/07/23 147 lb 3.2 oz (66.8 kg)  01/03/23 144 lb 6.4 oz (65.5 kg)  12/13/22 150 lb 6.4 oz (68.2 kg)   She follows wieth Dr. Antoine Poche, cardiology for  bradycardia and CAD (distal small vessel and non-obstructing proximal disease); hx normal cardiac cath x2 / ECHO 2019 EF 55-60% LAE MYOVIEW 3/12 EF 60%. She has aortic atherosclerosis per CT 01/2017. Recently in 01/2021 presented to ED with CP, had normal troponins and EKG, resolved with ASA and nitro x 2, she was advised to follow up with cardiology ASAP but reports she never did so as had just seen him prior to this event and was advised "all good." She denies CP episodes since.    Her blood pressure has been controlled at home, today their BP is BP: (!) 140/82  She does not workout. She denies chest pain, shortness of breath, dizziness.   She is on cholesterol medication Atorvastatin and denies myalgias. Her cholesterol is at goal. The  cholesterol last visit was:   Lab Results  Component Value Date   CHOL 134 01/03/2023   HDL 73 01/03/2023   LDLCALC 48 01/03/2023   TRIG 46 01/03/2023   CHOLHDL 1.8 01/03/2023    She has been working on diet and exercise for prediabetes, and denies polydipsia and polyuria. Last A1C in the office was:  Lab Results  Component Value Date   HGBA1C 5.8 (H) 01/03/2023   Patient is on Vitamin D supplement.   Lab Results  Component Value Date   VD25OH 66 01/03/2023     She is on thyroid medication, not on biotin.  Medication does not need chronic adjustments.  Last TSH: Lab Results  Component Value Date   TSH 1.61 01/03/2023   Reports a hx of IDA.  Recent Hgb stable: Lab Results  Component Value Date   HGB 11.8 01/03/2023   Lab Results  Component Value Date   IRON 93 01/03/2023   TIBC 330 01/03/2023   FERRITIN 100 09/03/2018   Current Medications:  Current Outpatient Medications on File Prior to Visit  Medication Sig   Ascorbic Acid (VITAMIN C) 500 MG CAPS Take 1 tablet by mouth daily.    aspirin 81 MG tablet Take 81 mg by mouth daily.   atorvastatin (LIPITOR) 20 MG tablet TAKE 1 TABLET BY MOUTH ONCE DAILY FOR CHOLESTEROL   Calcium Citrate-Vitamin D (CALCIUM CITRATE + D3 PO) Take 600 mg by mouth daily.    diphenhydramine-acetaminophen (TYLENOL PM) 25-500 MG TABS tablet Take 1 tablet by mouth at bedtime as needed.   escitalopram (LEXAPRO) 5 MG tablet Take 1 tab daily for mood.   esomeprazole (NEXIUM)  40 MG capsule Take 1 capsule Daily for Heartburn & Indigestion   Ferrous Sulfate (SLOW RELEASE IRON PO) Take 1 tablet by mouth daily.    fish oil-omega-3 fatty acids 1000 MG capsule Take 1 g by mouth daily.   fluticasone (FLONASE) 50 MCG/ACT nasal spray Place 2 sprays into both nostrils daily as needed for allergies.   gabapentin (NEURONTIN) 100 MG capsule Take 1 capsule 4 x /day for Neuropathy pain                                                                       /                                                         take                                       by                                         mouth.   levothyroxine (EUTHYROX) 50 MCG tablet Take  1 tablet  Daily  on an empty stomach with only water for 30 minutes & no Antacid meds, Calcium or Magnesium for 4 hours & avoid Biotin   Magnesium 250 MG TABS Take 1 tablet by mouth daily.    Melatonin 10 MG TABS Take 1 tablet by mouth at bedtime.   Naproxen Sodium (ALEVE PO) Take by mouth.   nitroGLYCERIN (NITROSTAT) 0.4 MG SL tablet Place 1 tablet (0.4 mg total) under the tongue every 5 (five) minutes as needed for chest pain.   potassium chloride (K-DUR) 10 MEQ tablet Take 1 tablet 2 x /day   promethazine-dextromethorphan (PROMETHAZINE-DM) 6.25-15 MG/5ML syrup Take 5 mLs by mouth 4 (four) times daily as needed for cough.   rOPINIRole (REQUIP) 3 MG tablet Take  1 tablet   3 x /day  as needed  for Restless Legs   vitamin B-12 (CYANOCOBALAMIN) 1000 MCG tablet Take 1,000 mcg by mouth daily.    Vitamin D, Ergocalciferol, (DRISDOL) 1.25 MG (50000 UNIT) CAPS capsule Take 1 capsule twice weekly for Severe Vitamin D Deficiency                                                                               /  take                                         by                                        mouth.   [DISCONTINUED] olmesartan-hydrochlorothiazide (BENICAR HCT) 20-12.5 MG tablet Take 1 tablet Daily for BP   FLUTICASONE FUROATE NA Place into the nose.   olmesartan (BENICAR) 20 MG tablet Take 1 tablet by mouth once daily for blood pressure (Patient not taking: Reported on 05/07/2023)   sucralfate (CARAFATE) 1 g tablet Take 1 tablet (1 g total) by mouth 4 (four) times daily.   No current facility-administered medications on file prior to visit.     Allergies:  Allergies  Allergen Reactions   Codeine Other (See Comments)    "puts me out of it".  Dysphoria.  Patient  tolerates Tramadol without complications.   Flagyl [Metronidazole] Hives   Nabumetone     GI upset     Medical History:  Past Medical History:  Diagnosis Date   Allergic rhinitis    Arthritis    Cholecystitis, chronic    Dysrhythmia    Hx bradycardia - hx normal cardiac cath x2 / ECHO 2009 EF 60% LAE MYOVIEW 3/12 EF 60% NO ISCHEMIA   Gastrointestinal ulcer due to Helicobacter pylori 10/14/2017   Hyperlipidemia    Hypertension    no treatment after 120 lb weight loss   Hypothyroidism    Prediabetes    Thyroid nodule    Family history- Reviewed and unchanged Social history- Reviewed and unchanged   Review of Systems:  ROS    Physical Exam: BP (!) 140/82   Pulse (!) 49   Temp (!) 97.5 F (36.4 C)   Ht 5' 2.75" (1.594 m)   Wt 147 lb 3.2 oz (66.8 kg)   LMP  (LMP Unknown)   SpO2 99%   BMI 26.28 kg/m  Wt Readings from Last 3 Encounters:  05/07/23 147 lb 3.2 oz (66.8 kg)  01/03/23 144 lb 6.4 oz (65.5 kg)  12/13/22 150 lb 6.4 oz (68.2 kg)   General Appearance: Well nourished, in no apparent distress. Eyes: PERRLA, EOMs, conjunctiva no swelling or erythema Sinuses: No Frontal/maxillary tenderness ENT/Mouth: Ext aud canals clear, TMs without erythema, bulging. No erythema, swelling, or exudate on post pharynx.  Tonsils not swollen or erythematous. Hearing normal.  Neck: Supple, thyroid normal.  Respiratory: Respiratory effort normal, BS equal bilaterally without rales, rhonchi, wheezing or stridor.  Cardio: RRR with no MRGs. Brisk peripheral pulses without edema.  Abdomen: Soft, + BS.  Non tender, no guarding, rebound, hernias, masses. Lymphatics: Non tender without lymphadenopathy.  Musculoskeletal: Full ROM, 5/5 strength, Normal gait Skin: Warm, dry without rashes, lesions, ecchymosis.  Neuro: Cranial nerves intact. No cerebellar symptoms.  Psych: Awake and oriented X 3, normal affect, Insight and Judgment appropriate.    Adela Glimpse, NP 1:17 PM [place]  [facility]

## 2023-05-07 NOTE — Patient Instructions (Signed)
Cetirizine Chewable Tablets What is this medication? CETIRIZINE (se TI ra zeen prevents and treats allergy symptoms, such as red, itchy eyes, sneezing, a runny or stuffy nose, or hives. It works by blocking histamine, a substance released by the body during an allergic reaction. It belongs to a group of medications called antihistamines. This medicine may be used for other purposes; ask your health care provider or pharmacist if you have questions. COMMON BRAND NAME(S): All Day Allergy Children's, Zyrtec, Zyrtec Chewable, Zyrtec Children's, ZYRTEC Children's Dye Free, ZYRTEC Dye Free What should I tell my care team before I take this medication? They need to know if you have any of these conditions: Liver disease Kidney disease An unusual or allergic reaction to cetirizine, other medications, foods, dyes, or preservatives Pregnant or trying to get pregnant Breastfeeding How should I use this medication? Take this medication by mouth with a glass of water. Chew it completely before swallowing. Follow the directions on the prescription label. You can take it with or without food. Do not take more medication than directed. You may need to take this medication for several days before your symptoms improve. Talk to your care team about the use of this medication in children. While this medication may be prescribed for selected conditions, precautions do apply. Overdosage: If you think you have taken too much of this medicine contact a poison control center or emergency room at once. NOTE: This medicine is only for you. Do not share this medicine with others. What if I miss a dose? If you miss a dose, take it as soon as you can. If it is almost time for your next dose, take only that dose. Do not take double or extra doses. What may interact with this medication? Alcohol Certain medications for anxiety or sleep Opioid medications for pain Other medications for colds or allergies This list may not  describe all possible interactions. Give your health care provider a list of all the medicines, herbs, non-prescription drugs, or dietary supplements you use. Also tell them if you smoke, drink alcohol, or use illegal drugs. Some items may interact with your medicine. What should I watch for while using this medication? Visit your care team for regular checks on your progress. Tell your care team if your symptoms do not start to get better or if they get worse. This medication may affect your coordination, reaction time, or judgment. Do not drive or operate machinery until you know how this medication affects you. Sit up or stand slowly to reduce the risk of dizzy or fainting spells. Drinking alcohol with this medication can increase the risk of these side effects. Your mouth may get dry. Chewing sugarless gum or sucking hard candy and drinking plenty of water may help. Contact your care team if the problem does not go away or is severe. What side effects may I notice from receiving this medication? Side effects that you should report to your care team as soon as possible: Allergic reactions--skin rash, itching, hives, swelling of the face, lips, tongue, or throat Side effects that usually do not require medical attention (report these to your care team if they continue or are bothersome): Dizziness Drowsiness Dry mouth Fatigue This list may not describe all possible side effects. Call your doctor for medical advice about side effects. You may report side effects to FDA at 1-800-FDA-1088. Where should I keep my medication? Keep out of the reach of children. Store at room temperature between 15 and 30 degrees C (59  and 86 degrees F). Throw away any unused medication after the expiration date. NOTE: This sheet is a summary. It may not cover all possible information. If you have questions about this medicine, talk to your doctor, pharmacist, or health care provider.  2024 [employer]/Gold Standard  (2022-12-23 00:00:00)

## 2023-05-08 LAB — CBC WITH DIFFERENTIAL/PLATELET
Basophils Relative: 1.4 %
Eosinophils Relative: 6.8 %
HCT: 37 % (ref 35.0–45.0)
MCV: 92.3 fL (ref 80.0–100.0)
MPV: 11 fL (ref 7.5–12.5)
Neutro Abs: 1752 cells/uL (ref 1500–7800)
Neutrophils Relative %: 36.5 %
Platelets: 294 10*3/uL (ref 140–400)
RDW: 12.3 % (ref 11.0–15.0)
WBC: 4.8 10*3/uL (ref 3.8–10.8)

## 2023-05-08 LAB — COMPLETE METABOLIC PANEL WITH GFR
AG Ratio: 1.5 (calc) (ref 1.0–2.5)
ALT: 15 U/L (ref 6–29)
AST: 23 U/L (ref 10–35)
Albumin: 4 g/dL (ref 3.6–5.1)
Alkaline phosphatase (APISO): 88 U/L (ref 37–153)
BUN: 14 mg/dL (ref 7–25)
CO2: 31 mmol/L (ref 20–32)
Calcium: 9.4 mg/dL (ref 8.6–10.4)
Chloride: 104 mmol/L (ref 98–110)
Creat: 0.88 mg/dL (ref 0.60–1.00)
Globulin: 2.7 g/dL (calc) (ref 1.9–3.7)
Glucose, Bld: 86 mg/dL (ref 65–99)
Potassium: 5.1 mmol/L (ref 3.5–5.3)
Sodium: 141 mmol/L (ref 135–146)
Total Bilirubin: 2 mg/dL — ABNORMAL HIGH (ref 0.2–1.2)
Total Protein: 6.7 g/dL (ref 6.1–8.1)
eGFR: 70 mL/min/{1.73_m2} (ref >=60)

## 2023-05-08 LAB — HEMOGLOBIN A1C W/OUT EAG: Hgb A1c MFr Bld: 5.9 % of total Hgb — ABNORMAL HIGH (ref <5.7)

## 2023-05-08 LAB — TSH: TSH: 2.28 mIU/L (ref 0.40–4.50)

## 2023-05-31 ENCOUNTER — Other Ambulatory Visit: Payer: Self-pay | Admitting: Nurse Practitioner

## 2023-05-31 DIAGNOSIS — I10 Essential (primary) hypertension: Secondary | ICD-10-CM

## 2023-06-04 ENCOUNTER — Other Ambulatory Visit: Payer: Self-pay | Admitting: Nurse Practitioner

## 2023-06-04 DIAGNOSIS — I10 Essential (primary) hypertension: Secondary | ICD-10-CM

## 2023-10-03 ENCOUNTER — Encounter: Payer: Self-pay | Admitting: Nurse Practitioner

## 2023-10-03 ENCOUNTER — Ambulatory Visit (INDEPENDENT_AMBULATORY_CARE_PROVIDER_SITE_OTHER): Payer: Federal, State, Local not specified - PPO | Admitting: Nurse Practitioner

## 2023-10-03 VITALS — BP 122/80 | HR 51 | Temp 97.4°F | Ht 62.75 in | Wt 153.8 lb

## 2023-10-03 DIAGNOSIS — M549 Dorsalgia, unspecified: Secondary | ICD-10-CM

## 2023-10-03 DIAGNOSIS — I10 Essential (primary) hypertension: Secondary | ICD-10-CM | POA: Diagnosis not present

## 2023-10-03 DIAGNOSIS — F418 Other specified anxiety disorders: Secondary | ICD-10-CM

## 2023-10-03 DIAGNOSIS — E782 Mixed hyperlipidemia: Secondary | ICD-10-CM

## 2023-10-03 DIAGNOSIS — K219 Gastro-esophageal reflux disease without esophagitis: Secondary | ICD-10-CM

## 2023-10-03 DIAGNOSIS — I2510 Atherosclerotic heart disease of native coronary artery without angina pectoris: Secondary | ICD-10-CM

## 2023-10-03 DIAGNOSIS — D508 Other iron deficiency anemias: Secondary | ICD-10-CM

## 2023-10-03 DIAGNOSIS — E038 Other specified hypothyroidism: Secondary | ICD-10-CM

## 2023-10-03 DIAGNOSIS — I700 Atherosclerosis of aorta: Secondary | ICD-10-CM

## 2023-10-03 DIAGNOSIS — J0101 Acute recurrent maxillary sinusitis: Secondary | ICD-10-CM

## 2023-10-03 DIAGNOSIS — K9189 Other postprocedural complications and disorders of digestive system: Secondary | ICD-10-CM

## 2023-10-03 DIAGNOSIS — Z79899 Other long term (current) drug therapy: Secondary | ICD-10-CM

## 2023-10-03 DIAGNOSIS — E559 Vitamin D deficiency, unspecified: Secondary | ICD-10-CM

## 2023-10-03 DIAGNOSIS — R001 Bradycardia, unspecified: Secondary | ICD-10-CM

## 2023-10-03 DIAGNOSIS — E1122 Type 2 diabetes mellitus with diabetic chronic kidney disease: Secondary | ICD-10-CM | POA: Diagnosis not present

## 2023-10-03 DIAGNOSIS — N182 Chronic kidney disease, stage 2 (mild): Secondary | ICD-10-CM

## 2023-10-03 DIAGNOSIS — E1140 Type 2 diabetes mellitus with diabetic neuropathy, unspecified: Secondary | ICD-10-CM

## 2023-10-03 NOTE — Patient Instructions (Signed)

## 2023-10-03 NOTE — Progress Notes (Signed)
FOLLOW UP  Tracy Paul is seen today for a general follow up.  Below references diagnostics and plan of care:  Assessment and Plan:   Hypertension/Bradycardia Discussed DASH (Dietary Approaches to Stop Hypertension) DASH diet is lower in sodium than a typical American diet. Cut back on foods that are high in saturated fat, cholesterol, and trans fats. Eat more whole-grain foods, fish, poultry, and nuts Remain active and exercise as tolerated daily.  Monitor BP at home-Call if greater than 130/80.  Check CMP/CBC  Hyperlipidemia Discussed lifestyle modifications. Recommended diet heavy in fruits and veggies, omega 3's. Decrease consumption of animal meats, cheeses, and dairy products. Remain active and exercise as tolerated. Continue to monitor. Check lipids/TSH  Diabetes Type 2 Education: Reviewed 'ABCs' of diabetes management  Discussed goals to be met and/or maintained include A1C (<7) Blood pressure (<130/80) Cholesterol (LDL <70) Continue Eye Exam yearly  Continue Dental Exam Q6 mo Discussed dietary recommendations Discussed Physical Activity recommendations Check A1C  Hypothyroidism Continue Levothyroxine. Reminded to take on an empty stomach 30-78mins before food.  Stop any Biotin Supplement 48-72 hours before next TSH level to reduce the risk of falsely low TSH levels. Continue to monitor.    CAD/Aortic Atherosclerotics Keep BG and BP well controlled Monitor cholesterol Remain active and monitor weight  Iron deficiency anemia Monitor CBC Discussed iron rich foods.  GERD No suspected reflux complications (Barret/stricture). Lifestyle modification:  wt loss, avoid meals 2-3h before bedtime. Consider eliminating food triggers:  chocolate, caffeine, EtOH, acid/spicy food.  Vitamin D deficiency Continue supplement for goal of 60-100 Monitor Vitamin D levels  Depression with anxiety Continue Lexapro Reviewed relaxation techniques.  Sleep  hygiene. Recommended Cognitive Behavioral Therapy (CBT) PRN Encouraged personality growth wand development through coping techniques and problem-solving skills. Limit/Decrease/Monitor drug/alcohol intake.    Diabetic neuropathy Keep BG well controlled Inspect feet daily Good shoe support. Remain active to help promote circulation  Recurrent sinusitis Has followed with ENT - surgery optional but patient defers. Discussed importance of daily antihistamine  Zyrtec 10 mg sample provided. Discussed benefit of local honey. Avoid triggers  Medication management All medications discussed and reviewed in full. All questions and concerns regarding medications addressed.    Mid back pain Discussed completed DEXA scan for review of osteoporosis. Discussed Extra Strength tylenol to aide with pain. Alternate Ice/Heat Rest to decease any insult to injury Possible thoracic x-ray if s/s fail to improve to assess for any underlying etiology.  Orders Placed This Encounter  Procedures   CBC with Differential/Platelet   COMPLETE METABOLIC PANEL WITH GFR   Lipid panel   Hemoglobin A1c   TSH   Notify office for further evaluation and treatment, questions or concerns if any reported s/s fail to improve.   The patient was advised to call back or seek an in-person evaluation if any symptoms worsen or if the condition fails to improve as anticipated.   Further disposition pending results of labs. Discussed med's effects and SE's.    I discussed the assessment and treatment plan with the patient. The patient was provided an opportunity to ask questions and all were answered. The patient agreed with the plan and demonstrated an understanding of the instructions.  Discussed med's effects and SE's. Screening labs and tests as requested with regular follow-up as recommended.  I provided 30 minutes of face-to-face time during this encounter including counseling, chart review, and critical decision  making was preformed.  Today's Plan of Care is based on a patient-centered health care approach  known as shared decision making - the decisions, tests and treatments allow for patient preferences and values to be balanced with clinical evidence.      Future Appointments  Date Time Provider Department Center  10/03/2023  2:30 PM Tracy Glimpse, NP [facility] None  01/09/2024 11:00 AM Tracy Cowboy, MD [facility] None  04/28/2024  9:30 AM Tracy Glimpse, NP [facility] None  07/29/2024  9:30 AM Tracy Glimpse, NP [facility] None  11/03/2024  9:30 AM Tracy Cowboy, MD [facility] None    ----------------------------------------------------------------------------------------------------------------------  HPI 72 y.o. female  presents for 3 month follow up on hypertension, cholesterol, diabetes, weight and vitamin D deficiency.   Overall she reports feeling well today.  However, she has noticed a lingering pain the mid part of her back.  Most noticeable on the left side.  Feels like a sharp shooting pain in nature.  Intermittent.  NKI, trauma, fall.  Takes IBU when flared and is effective but unable to take consistently d/t SE.  She is not UTD on DEXA scan.    She has a hx of recurrent sinusitis which she does not currently treat with medications other than PRN Flonase.  Reports consistent PND with nasal congestion and feeling of fullness in ears and head.  She is established with Otolaryngology, Dr. Ernestene Kiel.  Was told that surgery was an option but patient defers at this time.  BMI is Body mass index is 27.46 kg/m., she has not been working on diet and exercise. Wt Readings from Last 3 Encounters:  10/03/23 153 lb 12.8 oz (69.8 kg)  05/07/23 147 lb 3.2 oz (66.8 kg)  01/03/23 144 lb 6.4 oz (65.5 kg)   She follows wieth Dr. Antoine Poche, cardiology for  bradycardia and CAD (distal small vessel and non-obstructing proximal disease); hx normal cardiac cath x2 / ECHO 2019 EF 55-60% LAE MYOVIEW 3/12  EF 60%. She has aortic atherosclerosis per CT 01/2017. Recently in 01/2021 presented to ED with CP, had normal troponins and EKG, resolved with ASA and nitro x 2, she was advised to follow up with cardiology ASAP but reports she never did so as had just seen him prior to this event and was advised "all good." She denies CP episodes since.    Her blood pressure has been controlled at home, today their BP is BP: 122/80  She does not workout. She denies chest pain, shortness of breath, dizziness.   She is on cholesterol medication Atorvastatin and denies myalgias. Her cholesterol is at goal. The cholesterol last visit was:   Lab Results  Component Value Date   CHOL 134 01/03/2023   HDL 73 01/03/2023   LDLCALC 48 01/03/2023   TRIG 46 01/03/2023   CHOLHDL 1.8 01/03/2023    She has been working on diet and exercise for prediabetes, and denies polydipsia and polyuria. Last A1C in the office was:  Lab Results  Component Value Date   HGBA1C 5.9 (H) 05/07/2023   Patient is on Vitamin D supplement.   Lab Results  Component Value Date   VD25OH 66 01/03/2023     She is on thyroid medication, not on biotin.  Medication does not need chronic adjustments.  Last TSH: Lab Results  Component Value Date   TSH 2.28 05/07/2023   Reports a hx of IDA.  Recent Hgb stable: Lab Results  Component Value Date   HGB 12.2 05/07/2023   Lab Results  Component Value Date   IRON 93 01/03/2023   TIBC 330 01/03/2023   FERRITIN 100  09/03/2018   Current Medications:  Current Outpatient Medications on File Prior to Visit  Medication Sig   Ascorbic Acid (VITAMIN C) 500 MG CAPS Take 1 tablet by mouth daily.    aspirin 81 MG tablet Take 81 mg by mouth daily.   atorvastatin (LIPITOR) 20 MG tablet TAKE 1 TABLET BY MOUTH ONCE DAILY FOR CHOLESTEROL   Calcium Citrate-Vitamin D (CALCIUM CITRATE + D3 PO) Take 600 mg by mouth daily.    diphenhydramine-acetaminophen (TYLENOL PM) 25-500 MG TABS tablet Take 1 tablet by mouth  at bedtime as needed.   escitalopram (LEXAPRO) 5 MG tablet Take 1 tab daily for mood.   esomeprazole (NEXIUM) 40 MG capsule Take 1 capsule Daily for Heartburn & Indigestion   Ferrous Sulfate (SLOW RELEASE IRON PO) Take 1 tablet by mouth daily.    fish oil-omega-3 fatty acids 1000 MG capsule Take 1 g by mouth daily.   fluticasone (FLONASE) 50 MCG/ACT nasal spray Place 2 sprays into both nostrils daily as needed for allergies.   gabapentin (NEURONTIN) 100 MG capsule Take 1 capsule 4 x /day for Neuropathy pain                                                                       /                                                        take                                       by                                         mouth.   levothyroxine (EUTHYROX) 50 MCG tablet Take  1 tablet  Daily  on an empty stomach with only water for 30 minutes & no Antacid meds, Calcium or Magnesium for 4 hours & avoid Biotin   Magnesium 250 MG TABS Take 1 tablet by mouth daily.    Melatonin 10 MG TABS Take 1 tablet by mouth at bedtime.   Naproxen Sodium (ALEVE PO) Take by mouth.   nitroGLYCERIN (NITROSTAT) 0.4 MG SL tablet Place 1 tablet (0.4 mg total) under the tongue every 5 (five) minutes as needed for chest pain.   olmesartan (BENICAR) 20 MG tablet Take 1 tablet by mouth once daily for blood pressure   potassium chloride (K-DUR) 10 MEQ tablet Take 1 tablet 2 x /day   promethazine-dextromethorphan (PROMETHAZINE-DM) 6.25-15 MG/5ML syrup Take 5 mLs by mouth 4 (four) times daily as needed for cough.   rOPINIRole (REQUIP) 3 MG tablet Take  1 tablet   3 x /day  as needed  for Restless Legs   sucralfate (CARAFATE) 1 g tablet Take 1 tablet (1 g total) by mouth 4 (four) times daily.   vitamin B-12 (CYANOCOBALAMIN) 1000 MCG tablet Take 1,000 mcg by  mouth daily.    Vitamin D, Ergocalciferol, (DRISDOL) 1.25 MG (50000 UNIT) CAPS capsule Take 1 capsule twice weekly for Severe Vitamin D Deficiency                                                                                /                                                          take                                         by                                        mouth.   [DISCONTINUED] olmesartan-hydrochlorothiazide (BENICAR HCT) 20-12.5 MG tablet Take 1 tablet Daily for BP   No current facility-administered medications on file prior to visit.     Allergies:  Allergies  Allergen Reactions   Codeine Other (See Comments)    "puts me out of it".  Dysphoria.  Patient tolerates Tramadol without complications.   Flagyl [Metronidazole] Hives   Nabumetone     GI upset     Medical History:  Past Medical History:  Diagnosis Date   Allergic rhinitis    Arthritis    Cholecystitis, chronic    Dysrhythmia    Hx bradycardia - hx normal cardiac cath x2 / ECHO 2009 EF 60% LAE MYOVIEW 3/12 EF 60% NO ISCHEMIA   Gastrointestinal ulcer due to Helicobacter pylori 10/14/2017   Hyperlipidemia    Hypertension    no treatment after 120 lb weight loss   Hypothyroidism    Prediabetes    Thyroid nodule    Family history- Reviewed and unchanged Social history- Reviewed and unchanged   Review of Systems: A complete ROS was performed with pertinent positives/negatives noted in the HPI. The remainder of the ROS are negative.  Physical Exam: BP 122/80   Pulse (!) 51   Temp (!) 97.4 F (36.3 C)   Ht 5' 2.75" (1.594 m)   Wt 153 lb 12.8 oz (69.8 kg)   LMP  (LMP Unknown)   SpO2 98%   BMI 27.46 kg/m  Wt Readings from Last 3 Encounters:  10/03/23 153 lb 12.8 oz (69.8 kg)  05/07/23 147 lb 3.2 oz (66.8 kg)  01/03/23 144 lb 6.4 oz (65.5 kg)   General Appearance: Well nourished, in no apparent distress. Eyes: PERRLA, EOMs, conjunctiva no swelling or erythema Sinuses: No Frontal/maxillary tenderness ENT/Mouth: Ext aud canals clear, TMs without erythema, bulging. No erythema, swelling, or exudate on post pharynx.  Tonsils not swollen or erythematous. Hearing normal.  Neck:  Supple, thyroid normal.  Respiratory: Respiratory effort normal, BS equal bilaterally without rales, rhonchi, wheezing or stridor.  Cardio: RRR with no MRGs. Brisk peripheral pulses  without edema.  Abdomen: Soft, + BS.  Non tender, no guarding, rebound, hernias, masses. Lymphatics: Non tender without lymphadenopathy.  Musculoskeletal: Full ROM, 5/5 strength, Normal gait Skin: Warm, dry without rashes, lesions, ecchymosis.  Neuro: Cranial nerves intact. No cerebellar symptoms.  Psych: Awake and oriented X 3, normal affect, Insight and Judgment appropriate.    Tracy Glimpse, NP 2:18 PM [facility]

## 2023-10-04 LAB — COMPLETE METABOLIC PANEL WITH GFR
AG Ratio: 1.7 (calc) (ref 1.0–2.5)
ALT: 9 U/L (ref 6–29)
AST: 18 U/L (ref 10–35)
Albumin: 4.1 g/dL (ref 3.6–5.1)
Alkaline phosphatase (APISO): 95 U/L (ref 37–153)
BUN: 14 mg/dL (ref 7–25)
CO2: 28 mmol/L (ref 20–32)
Calcium: 9.6 mg/dL (ref 8.6–10.4)
Chloride: 104 mmol/L (ref 98–110)
Creat: 0.88 mg/dL (ref 0.60–1.00)
Globulin: 2.4 g/dL (ref 1.9–3.7)
Glucose, Bld: 87 mg/dL (ref 65–139)
Potassium: 3.9 mmol/L (ref 3.5–5.3)
Sodium: 141 mmol/L (ref 135–146)
Total Bilirubin: 1.8 mg/dL — ABNORMAL HIGH (ref 0.2–1.2)
Total Protein: 6.5 g/dL (ref 6.1–8.1)
eGFR: 70 mL/min/{1.73_m2} (ref >=60)

## 2023-10-04 LAB — LIPID PANEL
Cholesterol: 147 mg/dL (ref <200)
HDL: 76 mg/dL (ref >=50)
LDL Cholesterol (Calc): 57 mg/dL
Non-HDL Cholesterol (Calc): 71 mg/dL (ref <130)
Total CHOL/HDL Ratio: 1.9 (calc) (ref <5.0)
Triglycerides: 55 mg/dL (ref <150)

## 2023-10-04 LAB — CBC WITH DIFFERENTIAL/PLATELET
Absolute Lymphocytes: 2668 {cells}/uL (ref 850–3900)
Absolute Monocytes: 407 {cells}/uL (ref 200–950)
Basophils Absolute: 61 {cells}/uL (ref 0–200)
Basophils Relative: 1.1 %
Eosinophils Absolute: 341 {cells}/uL (ref 15–500)
Eosinophils Relative: 6.2 %
HCT: 36.7 % (ref 35.0–45.0)
Hemoglobin: 12.1 g/dL (ref 11.7–15.5)
MCH: 30.7 pg (ref 27.0–33.0)
MCHC: 33 g/dL (ref 32.0–36.0)
MCV: 93.1 fL (ref 80.0–100.0)
MPV: 11.4 fL (ref 7.5–12.5)
Monocytes Relative: 7.4 %
Neutro Abs: 2024 {cells}/uL (ref 1500–7800)
Neutrophils Relative %: 36.8 %
Platelets: 258 10*3/uL (ref 140–400)
RBC: 3.94 10*6/uL (ref 3.80–5.10)
RDW: 12.2 % (ref 11.0–15.0)
Total Lymphocyte: 48.5 %
WBC: 5.5 10*3/uL (ref 3.8–10.8)

## 2023-10-04 LAB — HEMOGLOBIN A1C
Hgb A1c MFr Bld: 5.7 %{Hb} — ABNORMAL HIGH (ref <5.7)
Mean Plasma Glucose: 117 mg/dL
eAG (mmol/L): 6.5 mmol/L

## 2023-10-04 LAB — TSH: TSH: 1.95 m[IU]/L (ref 0.40–4.50)

## 2023-12-24 ENCOUNTER — Other Ambulatory Visit: Payer: Self-pay

## 2023-12-24 MED ORDER — LEVOTHYROXINE SODIUM 50 MCG PO TABS: ORAL_TABLET | ORAL | 0 refills | Status: DC

## 2023-12-30 ENCOUNTER — Other Ambulatory Visit: Payer: Self-pay

## 2023-12-30 DIAGNOSIS — E785 Hyperlipidemia, unspecified: Secondary | ICD-10-CM

## 2023-12-30 MED ORDER — ATORVASTATIN CALCIUM 20 MG PO TABS: ORAL_TABLET | ORAL | 0 refills | Status: DC

## 2024-01-02 ENCOUNTER — Other Ambulatory Visit: Payer: Self-pay

## 2024-01-02 MED ORDER — LEVOTHYROXINE SODIUM 50 MCG PO TABS: ORAL_TABLET | ORAL | 0 refills | Status: DC

## 2024-01-09 ENCOUNTER — Encounter: Payer: Federal, State, Local not specified - PPO | Admitting: Internal Medicine

## 2024-02-28 ENCOUNTER — Encounter: Payer: Self-pay | Admitting: Nurse Practitioner

## 2024-02-28 ENCOUNTER — Ambulatory Visit (INDEPENDENT_AMBULATORY_CARE_PROVIDER_SITE_OTHER): Payer: Federal, State, Local not specified - PPO | Admitting: Nurse Practitioner

## 2024-02-28 VITALS — BP 126/82 | HR 50 | Temp 97.8°F | Ht 62.75 in | Wt 157.2 lb

## 2024-02-28 DIAGNOSIS — Z860100 Personal history of colon polyps, unspecified: Secondary | ICD-10-CM | POA: Diagnosis not present

## 2024-02-28 DIAGNOSIS — D508 Other iron deficiency anemias: Secondary | ICD-10-CM

## 2024-02-28 DIAGNOSIS — E559 Vitamin D deficiency, unspecified: Secondary | ICD-10-CM

## 2024-02-28 DIAGNOSIS — N182 Chronic kidney disease, stage 2 (mild): Secondary | ICD-10-CM

## 2024-02-28 DIAGNOSIS — I10 Essential (primary) hypertension: Secondary | ICD-10-CM | POA: Diagnosis not present

## 2024-02-28 DIAGNOSIS — E038 Other specified hypothyroidism: Secondary | ICD-10-CM

## 2024-02-28 DIAGNOSIS — K9189 Other postprocedural complications and disorders of digestive system: Secondary | ICD-10-CM

## 2024-02-28 DIAGNOSIS — E782 Mixed hyperlipidemia: Secondary | ICD-10-CM

## 2024-02-28 DIAGNOSIS — E785 Hyperlipidemia, unspecified: Secondary | ICD-10-CM | POA: Diagnosis not present

## 2024-02-28 DIAGNOSIS — E1122 Type 2 diabetes mellitus with diabetic chronic kidney disease: Secondary | ICD-10-CM

## 2024-02-28 DIAGNOSIS — Z79899 Other long term (current) drug therapy: Secondary | ICD-10-CM

## 2024-02-28 MED ORDER — ATORVASTATIN CALCIUM 20 MG PO TABS: ORAL_TABLET | ORAL | 0 refills | Status: AC

## 2024-02-28 MED ORDER — LEVOTHYROXINE SODIUM 50 MCG PO TABS: ORAL_TABLET | ORAL | 0 refills | Status: DC

## 2024-02-28 NOTE — Progress Notes (Signed)
 New Patient Office Visit  Subjective   Patient ID: ZIYAH CORDOBA, female    DOB: January 25, 1951  Age: 73 y.o. MRN: [id number]  CC:  Chief Complaint  Patient presents with   Establish Care    HPI Tracy Paul presents to establish care.  The previous provider was Dr. Karyl Paget.  She has history of hypertension, diabetes, RLS,  vit D deficiency iron deficiency anemia, and hypothyroidism.  She is followed by cardiology for bradycardia and coronary artery disease.  She underwent gastric bypass surgery in 2014, maintaining her weight between 150 to 156 pounds from a pre-surgery weight of 280 pounds. Occasionally, she experiences nausea and the urge to vomit after consuming chicken. She denies abdominal pain, constipation, diarrhea, urinary frequency, or urgency.   Health Maintenance  Topic Date Due   Medicare Annual Wellness (AWV)  Never done   Zoster Vaccines- Shingrix (1 of 2) 05/07/1971   FOOT EXAM  12/15/2022   OPHTHALMOLOGY EXAM  01/04/2023   MAMMOGRAM  05/03/2023   Colonoscopy  05/29/2023   Diabetic kidney evaluation - Urine ACR  01/03/2024   COVID-19 Vaccine (3 - Moderna risk series) 03/15/2024 (Originally 02/24/2020)   HEMOGLOBIN A1C  04/02/2024   INFLUENZA VACCINE  05/29/2024   Diabetic kidney evaluation - eGFR measurement  10/02/2024   DTaP/Tdap/Td (3 - Td or Tdap) 05/23/2026   Pneumonia Vaccine 80+ Years old  Completed   DEXA SCAN  Completed   Hepatitis C Screening  Completed   HPV VACCINES  Aged Out   Meningococcal B Vaccine  Aged Out    There are no preventive care reminders to display for this patient.  Outpatient Encounter Medications as of 02/28/2024  Medication Sig   Ascorbic Acid (VITAMIN C) 500 MG CAPS Take 1 tablet by mouth daily.    aspirin  81 MG tablet Take 81 mg by mouth daily.   Calcium  Citrate-Vitamin D  (CALCIUM  CITRATE + D3 PO) Take 600 mg by mouth daily.    diphenhydramine -acetaminophen  (TYLENOL  PM) 25-500 MG TABS tablet Take 1 tablet by  mouth at bedtime as needed.   escitalopram  (LEXAPRO ) 5 MG tablet Take 1 tab daily for mood.   esomeprazole  (NEXIUM ) 40 MG capsule Take 1 capsule Daily for Heartburn & Indigestion   Ferrous Sulfate  (SLOW RELEASE IRON PO) Take 1 tablet by mouth daily.    fish oil-omega-3 fatty acids 1000 MG capsule Take 1 g by mouth daily.   fluticasone  (FLONASE ) 50 MCG/ACT nasal spray Place 2 sprays into both nostrils daily as needed for allergies.   gabapentin  (NEURONTIN ) 100 MG capsule Take 1 capsule 4 x /day for Neuropathy pain                                                                       /                                                        take  by                                         mouth.   Magnesium  250 MG TABS Take 1 tablet by mouth daily.    Melatonin 10 MG TABS Take 1 tablet by mouth at bedtime.   Naproxen Sodium (ALEVE PO) Take by mouth.   nitroGLYCERIN  (NITROSTAT ) 0.4 MG SL tablet Place 1 tablet (0.4 mg total) under the tongue every 5 (five) minutes as needed for chest pain.   olmesartan  (BENICAR ) 20 MG tablet Take 1 tablet by mouth once daily for blood pressure   potassium chloride  (K-DUR) 10 MEQ tablet Take 1 tablet 2 x /day   promethazine -dextromethorphan (PROMETHAZINE -DM) 6.25-15 MG/5ML syrup Take 5 mLs by mouth 4 (four) times daily as needed for cough.   rOPINIRole  (REQUIP ) 3 MG tablet Take  1 tablet   3 x /day  as needed  for Restless Legs   vitamin B-12 (CYANOCOBALAMIN ) 1000 MCG tablet Take 1,000 mcg by mouth daily.    Vitamin D , Ergocalciferol , (DRISDOL ) 1.25 MG (50000 UNIT) CAPS capsule Take 1 capsule twice weekly for Severe Vitamin D  Deficiency                                                                               /                                                          take                                         by                                        mouth.   [DISCONTINUED] atorvastatin  (LIPITOR) 20 MG tablet TAKE 1 TABLET BY  MOUTH ONCE DAILY FOR CHOLESTEROL   [DISCONTINUED] levothyroxine  (EUTHYROX ) 50 MCG tablet Take  1 tablet  Daily  on an empty stomach with only water for 30 minutes & no Antacid meds, Calcium  or Magnesium  for 4 hours & avoid Biotin    atorvastatin  (LIPITOR) 20 MG tablet TAKE 1 TABLET BY MOUTH ONCE DAILY FOR CHOLESTEROL   levothyroxine  (EUTHYROX ) 50 MCG tablet Take  1 tablet  Daily  on an empty stomach with only water for 30 minutes & no Antacid meds, Calcium  or Magnesium  for 4 hours & avoid Biotin    sucralfate  (CARAFATE ) 1 g tablet Take 1 tablet (1 g total) by mouth 4 (four) times daily.   [DISCONTINUED] olmesartan -hydrochlorothiazide  (BENICAR  HCT) 20-12.5 MG tablet Take 1 tablet Daily for BP   No facility-administered encounter medications on file as of 02/28/2024.    Past Medical History:  Diagnosis Date   Allergic rhinitis  Arthritis    Cholecystitis, chronic    Dysrhythmia    Hx bradycardia - hx normal cardiac cath x2 / ECHO 2009 EF 60% LAE MYOVIEW  3/12 EF 60% NO ISCHEMIA   Gastrointestinal ulcer due to Helicobacter pylori 10/14/2017   Hyperlipidemia    Hypertension    no treatment after 120 lb weight loss   Hypothyroidism    Prediabetes    Thyroid  nodule     Past Surgical History:  Procedure Laterality Date   Bilateral carpal tunnel surgery     BREATH TEK H PYLORI  07/01/2012   Procedure: BREATH TEK H PYLORI;  Surgeon: Quitman Bucy, MD;  Location: [facility];  Service: General;  Laterality: N/A;   CHOLECYSTECTOMY N/A 11/27/2013   Procedure: LAPAROSCOPIC CHOLECYSTECTOMY WITH INTRAOPERATIVE CHOLANGIOGRAM;  Surgeon: Azucena Bollard, MD;  Location: [facility];  Service: General;  Laterality: N/A;   GASTRIC ROUX-EN-Y N/A 12/16/2012   Procedure: LAPAROSCOPIC ROUX-EN-Y GASTRIC BYPASS WITH UPPER ENDOSCOPY;  Surgeon: Azucena Bollard, MD;  Location: [facility];  Service: General;  Laterality: N/A;  Gastric Bypass   NASAL SINUS SURGERY     REPLACEMENT TOTAL KNEE BILATERAL     bilat    TUBAL LIGATION      Family History  Problem Relation Age of Onset   Breast cancer Mother    Cancer Mother        breast cancer   Anuerysm Mother        brain anuerysm   Heart attack Father 75   Heart attack Sister 73   Breast cancer Paternal Aunt    Breast cancer Cousin    Colon cancer Neg Hx    Stomach cancer Neg Hx    Rectal cancer Neg Hx    Esophageal cancer Neg Hx     Social History   Socioeconomic History   Marital status: Married    Spouse name: Porfirio Bristol   Number of children: 2   Years of education: Not on file   Highest education level: Not on file  Occupational History   Occupation: Lobbyist: [employer]  Tobacco Use   Smoking status: Former    Current packs/day: 0.00    Average packs/day: 0.8 packs/day for 10.0 years (8.0 ttl pk-yrs)    Types: Cigarettes    Start date: 10/29/1969    Quit date: 10/30/1979    Years since quitting: 44.4   Smokeless tobacco: Never  Vaping Use   Vaping status: Never Used  Substance and Sexual Activity   Alcohol use: No   Drug use: No   Sexual activity: Not on file  Other Topics Concern   Not on file  Social History Narrative   Not on file   Social Drivers of Health   Financial Resource Strain: Not on file  Food Insecurity: Not on file  Transportation Needs: Not on file  Physical Activity: Not on file  Stress: Not on file  Social Connections: Not on file  Intimate Partner Violence: Not on file    ROS Negative unless indicated in HPI.      Objective    BP 126/82   Pulse (!) 50   Temp 97.8 F (36.6 C)   Ht 5' 2.75" (1.594 m)   Wt 157 lb 3.2 oz (71.3 kg)   LMP  (LMP Unknown)   SpO2 99%   BMI 28.07 kg/m   Physical Exam      Assessment & Plan:  History of colonic polyps Assessment & Plan: History  of colonic polyps.  Recommended 5-year follow-up. - Referral sent for colonoscopy.  Orders: -     Ambulatory referral to Gastroenterology  Hyperlipidemia LDL goal <70 Assessment &  Plan: Lab Results  Component Value Date   CHOL 147 10/03/2023   HDL 76 10/03/2023   LDLCALC 57 10/03/2023   TRIG 55 10/03/2023   CHOLHDL 1.9 10/03/2023  - Continue Lipitor 20 mg for cardiovascular risk reduction.   Orders: -     Atorvastatin  Calcium ; TAKE 1 TABLET BY MOUTH ONCE DAILY FOR CHOLESTEROL  Dispense: 90 tablet; Refill: 0  Other specified hypothyroidism Assessment & Plan: Lab Results  Component Value Date   TSH 1.95 10/03/2023  - Stable on medication. - Continue levothyroxine  50 mcg  Orders: -     TSH; Future  Type 2 diabetes mellitus with stage 2 chronic kidney disease, without long-term current use of insulin  (HCC) -     Hemoglobin A1c; Future  Mixed hyperlipidemia -     Lipid panel; Future  Essential hypertension Assessment & Plan: Patient BP  Vitals:   02/28/24 0901  BP: 126/82    in the office. Advised pt to follow a low sodium and heart healthy diet. Advised patient to check blood pressure at home and send us  the readings via MyChart in 2 weeks.   Orders: -     COMPLETE METABOLIC PANEL WITHOUT GFR; Future -     CBC with Differential/Platelet; Future  Vitamin D  deficiency Assessment & Plan: Last vitamin D  66 on 01/03/23. Continue vitamin D  supplement. Will check vitamin D  level.  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   Tona Francis, NP advised about diet

## 2024-02-28 NOTE — Patient Instructions (Addendum)
 Please monitor your blood pressure 2-3 times a week at the same time and send via MyChart. YOUR MAMMOGRAM IS DUE, PLEASE CALL AND GET THIS SCHEDULED! [facility] - call [phone]  Please go to the local pharmacy to get the shingles vaccine

## 2024-03-02 ENCOUNTER — Telehealth: Payer: Self-pay

## 2024-03-02 NOTE — Telephone Encounter (Signed)
 Copied from CRM [id number]. Topic: Appointments - Transfer of Care >> Mar 02, 2024 11:46 AM Donald Frost wrote: Pt is requesting to transfer FROM: [facility] [place] Pt is requesting to transfer TO: [facility] Reason for requested transfer: The patient states she has family that goes to [facility] and they are all very happen and feel like they are truly taken care of  It is the responsibility of the team the patient would like to transfer to (Dr. Ferna How) to reach out to the patient if for any reason this transfer is not acceptable.  Please assist patient further

## 2024-03-15 DIAGNOSIS — E785 Hyperlipidemia, unspecified: Secondary | ICD-10-CM | POA: Insufficient documentation

## 2024-03-15 DIAGNOSIS — E1122 Type 2 diabetes mellitus with diabetic chronic kidney disease: Secondary | ICD-10-CM | POA: Insufficient documentation

## 2024-03-15 NOTE — Assessment & Plan Note (Signed)
 Lab Results  Component Value Date   HGBA1C 5.7 (H) 10/03/2023   -Advise diet management management and regular physical activity

## 2024-03-15 NOTE — Assessment & Plan Note (Signed)
 Lab Results  Component Value Date   TSH 1.95 10/03/2023  - Stable on medication. - Continue levothyroxine  50 mcg

## 2024-03-15 NOTE — Assessment & Plan Note (Signed)
 History of colonic polyps.  Recommended 5-year follow-up. - Referral sent for colonoscopy.

## 2024-03-15 NOTE — Assessment & Plan Note (Signed)
 Lab Results  Component Value Date   CHOL 147 10/03/2023   HDL 76 10/03/2023   LDLCALC 57 10/03/2023   TRIG 55 10/03/2023   CHOLHDL 1.9 10/03/2023  - Continue Lipitor 20 mg for cardiovascular risk reduction.

## 2024-03-15 NOTE — Assessment & Plan Note (Signed)
 Last vitamin D  66 on 01/03/23. Continue vitamin D  supplement. Will check vitamin D  level.

## 2024-03-15 NOTE — Assessment & Plan Note (Signed)
 Patient BP  Vitals:   02/28/24 0901  BP: 126/82    in the office. Advised pt to follow a low sodium and heart healthy diet. Advised patient to check blood pressure at home and send us  the readings via MyChart in 2 weeks.

## 2024-04-14 ENCOUNTER — Ambulatory Visit: Payer: Federal, State, Local not specified - PPO | Admitting: Nurse Practitioner

## 2024-04-28 ENCOUNTER — Ambulatory Visit: Payer: Federal, State, Local not specified - PPO | Admitting: Nurse Practitioner

## 2024-05-18 ENCOUNTER — Encounter: Payer: Self-pay | Admitting: Family Medicine

## 2024-05-18 ENCOUNTER — Ambulatory Visit: Admitting: Family Medicine

## 2024-05-18 VITALS — BP 124/78 | HR 61 | Temp 98.5°F | Ht 62.75 in | Wt 159.4 lb

## 2024-05-18 DIAGNOSIS — E785 Hyperlipidemia, unspecified: Secondary | ICD-10-CM

## 2024-05-18 DIAGNOSIS — Z0000 Encounter for general adult medical examination without abnormal findings: Secondary | ICD-10-CM | POA: Insufficient documentation

## 2024-05-18 DIAGNOSIS — I700 Atherosclerosis of aorta: Secondary | ICD-10-CM

## 2024-05-18 DIAGNOSIS — E663 Overweight: Secondary | ICD-10-CM

## 2024-05-18 DIAGNOSIS — Z1211 Encounter for screening for malignant neoplasm of colon: Secondary | ICD-10-CM

## 2024-05-18 DIAGNOSIS — Z1231 Encounter for screening mammogram for malignant neoplasm of breast: Secondary | ICD-10-CM

## 2024-05-18 DIAGNOSIS — Z0001 Encounter for general adult medical examination with abnormal findings: Secondary | ICD-10-CM

## 2024-05-18 DIAGNOSIS — E1122 Type 2 diabetes mellitus with diabetic chronic kidney disease: Secondary | ICD-10-CM | POA: Diagnosis not present

## 2024-05-18 DIAGNOSIS — I10 Essential (primary) hypertension: Secondary | ICD-10-CM

## 2024-05-18 DIAGNOSIS — Z13820 Encounter for screening for osteoporosis: Secondary | ICD-10-CM

## 2024-05-18 DIAGNOSIS — E559 Vitamin D deficiency, unspecified: Secondary | ICD-10-CM

## 2024-05-18 DIAGNOSIS — N182 Chronic kidney disease, stage 2 (mild): Secondary | ICD-10-CM

## 2024-05-18 DIAGNOSIS — K9189 Other postprocedural complications and disorders of digestive system: Secondary | ICD-10-CM

## 2024-05-18 DIAGNOSIS — Z780 Asymptomatic menopausal state: Secondary | ICD-10-CM

## 2024-05-18 DIAGNOSIS — E1140 Type 2 diabetes mellitus with diabetic neuropathy, unspecified: Secondary | ICD-10-CM

## 2024-05-18 DIAGNOSIS — D508 Other iron deficiency anemias: Secondary | ICD-10-CM

## 2024-05-18 MED ORDER — GABAPENTIN 100 MG PO CAPS: ORAL_CAPSULE | ORAL | 1 refills | Status: DC

## 2024-05-18 NOTE — Assessment & Plan Note (Signed)
 On Atorvastatin  20mg  daily without side effects. Labs today. I recommend consuming a heart healthy diet such as Mediterranean diet or DASH diet with whole grains, fruits, vegetable, fish, lean meats, nuts, and olive oil. Limit sweets and processed foods. I also encourage moderate intensity exercise 150 minutes weekly. This is 3-5 times weekly for 30-50 minutes each session. Goal should be pace of 3 miles/hours, or walking 1.5 miles in 30 minutes. The 10-year ASCVD risk score (Arnett DK, et al., 2019) is: 24.1%

## 2024-05-18 NOTE — Addendum Note (Signed)
 Addended by: KAYLA JEOFFREY RAMAN on: 05/18/2024 08:58 AM   Modules accepted: Level of Service

## 2024-05-18 NOTE — Progress Notes (Signed)
 New Patient Office Visit  Subjective    Patient ID: Tracy Paul, female    DOB: 18-Apr-1951  Age: 73 y.o. MRN: [id number]  CC:  Chief Complaint  Patient presents with   Transitions Of Care    HPI Tracy Paul presents to establish care. Oriented to practice routines and expectations. Has been seeing a PCP regularly. PMH includes HTN, DM2, RLS, aortic atherosclerosis, Vitamin D  Deficiency, IDA, and Hypothyroidism. She had a gastric bypass surgery in 2014. Tracy Paul is also followed by Cardiology.   HTN: well controlled, monitoring occasionally at home, followed by cardiology, on Olmesartan  20mg  daily  HLD: controlled on Atorvastatin  20mg  daily, heart healthy diet  DM2: borderline prior to bypass, not monitored  Aortic atherosclerosis: on CT 2018  Anemia: on ferrous sulfate  daily  Hypothyroidism: euthyroid, well controlled on Synthroid  50mcg daily   Outpatient Encounter Medications as of 05/18/2024  Medication Sig   Ascorbic Acid (VITAMIN C) 500 MG CAPS Take 1 tablet by mouth daily.    aspirin  81 MG tablet Take 81 mg by mouth daily.   atorvastatin  (LIPITOR) 20 MG tablet TAKE 1 TABLET BY MOUTH ONCE DAILY FOR CHOLESTEROL   Calcium  Citrate-Vitamin D  (CALCIUM  CITRATE + D3 PO) Take 600 mg by mouth daily.    diphenhydramine -acetaminophen  (TYLENOL  PM) 25-500 MG TABS tablet Take 1 tablet by mouth at bedtime as needed.   escitalopram  (LEXAPRO ) 5 MG tablet Take 1 tab daily for mood.   esomeprazole  (NEXIUM ) 40 MG capsule Take 1 capsule Daily for Heartburn & Indigestion   Ferrous Sulfate  (SLOW RELEASE IRON PO) Take 1 tablet by mouth daily.    fish oil-omega-3 fatty acids 1000 MG capsule Take 1 g by mouth daily.   fluticasone  (FLONASE ) 50 MCG/ACT nasal spray Place 2 sprays into both nostrils daily as needed for allergies.   levothyroxine  (EUTHYROX ) 50 MCG tablet Take  1 tablet  Daily  on an empty stomach with only water for 30 minutes & no Antacid meds, Calcium  or Magnesium  for 4  hours & avoid Biotin    Magnesium  250 MG TABS Take 1 tablet by mouth daily.    Melatonin 10 MG TABS Take 1 tablet by mouth at bedtime.   Naproxen Sodium (ALEVE PO) Take by mouth.   nitroGLYCERIN  (NITROSTAT ) 0.4 MG SL tablet Place 1 tablet (0.4 mg total) under the tongue every 5 (five) minutes as needed for chest pain.   olmesartan  (BENICAR ) 20 MG tablet Take 1 tablet by mouth once daily for blood pressure   potassium chloride  (K-DUR) 10 MEQ tablet Take 1 tablet 2 x /day   promethazine -dextromethorphan (PROMETHAZINE -DM) 6.25-15 MG/5ML syrup Take 5 mLs by mouth 4 (four) times daily as needed for cough.   rOPINIRole  (REQUIP ) 3 MG tablet Take  1 tablet   3 x /day  as needed  for Restless Legs   vitamin B-12 (CYANOCOBALAMIN ) 1000 MCG tablet Take 1,000 mcg by mouth daily.    Vitamin D , Ergocalciferol , (DRISDOL ) 1.25 MG (50000 UNIT) CAPS capsule Take 1 capsule twice weekly for Severe Vitamin D  Deficiency                                                                               /  take                                         by                                        mouth.   [DISCONTINUED] gabapentin  (NEURONTIN ) 100 MG capsule Take 1 capsule 4 x /day for Neuropathy pain                                                                       /                                                        take                                       by                                         mouth.   [DISCONTINUED] sucralfate  (CARAFATE ) 1 g tablet Take 1 tablet (1 g total) by mouth 4 (four) times daily.   gabapentin  (NEURONTIN ) 100 MG capsule Nightly for neuropathic pain   [DISCONTINUED] olmesartan -hydrochlorothiazide  (BENICAR  HCT) 20-12.5 MG tablet Take 1 tablet Daily for BP   No facility-administered encounter medications on file as of 05/18/2024.    Past Medical History:  Diagnosis Date   Allergic rhinitis    Arthritis    Cholecystitis, chronic     Dysrhythmia    Hx bradycardia - hx normal cardiac cath x2 / ECHO 2009 EF 60% LAE MYOVIEW  3/12 EF 60% NO ISCHEMIA   Gastrointestinal ulcer due to Helicobacter pylori 10/14/2017   Hyperlipidemia    Hypertension    no treatment after 120 lb weight loss   Hypothyroidism    Prediabetes    Thyroid  nodule     Past Surgical History:  Procedure Laterality Date   Bilateral carpal tunnel surgery     BREATH TEK H PYLORI  07/01/2012   Procedure: BREATH TEK H PYLORI;  Surgeon: Morene ONEIDA Olives, MD;  Location: [facility];  Service: General;  Laterality: N/A;   CHOLECYSTECTOMY N/A 11/27/2013   Procedure: LAPAROSCOPIC CHOLECYSTECTOMY WITH INTRAOPERATIVE CHOLANGIOGRAM;  Surgeon: Donnice KATHEE Lunger, MD;  Location: [facility];  Service: General;  Laterality: N/A;   GASTRIC ROUX-EN-Y N/A 12/16/2012   Procedure: LAPAROSCOPIC ROUX-EN-Y GASTRIC BYPASS WITH UPPER ENDOSCOPY;  Surgeon: Donnice KATHEE Lunger, MD;  Location: [facility];  Service: General;  Laterality: N/A;  Gastric Bypass   NASAL SINUS SURGERY     REPLACEMENT TOTAL KNEE BILATERAL     bilat   TUBAL LIGATION      Family History  Problem Relation Age of Onset  Breast cancer Mother    Cancer Mother        breast cancer   Anuerysm Mother        brain anuerysm   Heart attack Father 58   Heart attack Sister 75   Breast cancer Paternal Aunt    Breast cancer Cousin    Colon cancer Neg Hx    Stomach cancer Neg Hx    Rectal cancer Neg Hx    Esophageal cancer Neg Hx     Social History   Socioeconomic History   Marital status: Married    Spouse name: Lamar   Number of children: 2   Years of education: Not on file   Highest education level: Not on file  Occupational History   Occupation: Lobbyist: [employer]  Tobacco Use   Smoking status: Former    Current packs/day: 0.00    Average packs/day: 0.8 packs/day for 10.0 years (8.0 ttl pk-yrs)    Types: Cigarettes    Start date: 10/29/1969    Quit date: 10/30/1979    Years since  quitting: 44.5   Smokeless tobacco: Never  Vaping Use   Vaping status: Never Used  Substance and Sexual Activity   Alcohol use: No   Drug use: No   Sexual activity: Not on file  Other Topics Concern   Not on file  Social History Narrative   Not on file   Social Drivers of Health   Financial Resource Strain: Not on file  Food Insecurity: Not on file  Transportation Needs: Not on file  Physical Activity: Not on file  Stress: Not on file  Social Connections: Not on file  Intimate Partner Violence: Not on file    Review of Systems  Constitutional: Negative.   HENT: Negative.    Eyes:  Positive for blurred vision (upcoming cataract surgery).  Respiratory: Negative.    Cardiovascular: Negative.   Gastrointestinal: Negative.   Genitourinary: Negative.   Musculoskeletal: Negative.   Skin: Negative.   Neurological: Negative.   Endo/Heme/Allergies: Negative.   Psychiatric/Behavioral: Negative.    All other systems reviewed and are negative.       Objective    BP 124/78   Pulse 61   Temp 98.5 F (36.9 C)   Ht 5' 2.75 (1.594 m)   Wt 159 lb 6 oz (72.3 kg)   LMP  (LMP Unknown)   SpO2 99%   BMI 28.46 kg/m   Physical Exam Vitals and nursing note reviewed.  Constitutional:      Appearance: Normal appearance. She is normal weight.  HENT:     Head: Normocephalic and atraumatic.     Right Ear: Tympanic membrane, ear canal and external ear normal.     Left Ear: Tympanic membrane, ear canal and external ear normal.     Nose: Nose normal.     Mouth/Throat:     Mouth: Mucous membranes are moist.     Pharynx: Oropharynx is clear.  Eyes:     Extraocular Movements: Extraocular movements intact.     Conjunctiva/sclera: Conjunctivae normal.     Pupils: Pupils are equal, round, and reactive to light.  Cardiovascular:     Rate and Rhythm: Normal rate and regular rhythm.     Pulses: Normal pulses.     Heart sounds: Normal heart sounds.  Pulmonary:     Effort: Pulmonary  effort is normal.     Breath sounds: Normal breath sounds.  Abdominal:     General: Bowel sounds  are normal.     Palpations: Abdomen is soft.  Musculoskeletal:        General: Normal range of motion.     Cervical back: Normal range of motion and neck supple.  Skin:    General: Skin is warm and dry.     Capillary Refill: Capillary refill takes less than 2 seconds.  Neurological:     General: No focal deficit present.     Mental Status: She is alert and oriented to person, place, and time. Mental status is at baseline.  Psychiatric:        Mood and Affect: Mood normal.        Behavior: Behavior normal.        Thought Content: Thought content normal.        Judgment: Judgment normal.    Diabetic Foot Exam - Simple   Simple Foot Form Visual Inspection No deformities, no ulcerations, no other skin breakdown bilaterally: Yes Sensation Testing Intact to touch and monofilament testing bilaterally: Yes Pulse Check Posterior Tibialis and Dorsalis pulse intact bilaterally: Yes Comments          Assessment & Plan:   Problem List Items Addressed This Visit     Essential hypertension   Well controlled on Olmesartan  20mg  daily. Labs today. Recommend heart healthy diet such as Mediterranean diet with whole grains, fruits, vegetable, fish, lean meats, nuts, and olive oil. Limit salt. Encouraged moderate walking, 3-5 times/week for 30-50 minutes each session. Aim for at least 150 minutes.week. Goal should be pace of 3 miles/hours, or walking 1.5 miles in 30 minutes. Avoid tobacco products. Avoid excess alcohol. Take medications as prescribed and bring medications and blood pressure log with cuff to each office visit. Seek medical care for chest pain, palpitations, shortness of breath with exertion, dizziness/lightheadedness, vision changes, recurrent headaches, or swelling of extremities. Follow up in 3 months or sooner if needed.      Vitamin D  deficiency   Vitamin D  level drawn,  continue supplement as prescribed.       Relevant Orders   Vitamin D , 25-OH,Total,IA(Refl)   Aortic atherosclerosis (HCC) by Abd CT scan on 08/01/2017   On abdominal CT 08/11/2017, HTN, HLD, DM well controlled      Overweight (BMI 25.0-29.9)   Counseled on importance of weight management for overall health. Encouraged low calorie, heart healthy diet and moderate intensity exercise 150 minutes weekly. This is 3-5 times weekly for 30-50 minutes each session. Goal should be pace of 3 miles/hours, or walking 1.5 miles in 30 minutes and include strength training. History of gastric bypass.      Hyperlipidemia LDL goal <70   On Atorvastatin  20mg  daily without side effects. Labs today. I recommend consuming a heart healthy diet such as Mediterranean diet or DASH diet with whole grains, fruits, vegetable, fish, lean meats, nuts, and olive oil. Limit sweets and processed foods. I also encourage moderate intensity exercise 150 minutes weekly. This is 3-5 times weekly for 30-50 minutes each session. Goal should be pace of 3 miles/hours, or walking 1.5 miles in 30 minutes. The 10-year ASCVD risk score (Arnett DK, et al., 2019) is: 24.1%       Relevant Orders   Lipid panel   Type 2 diabetes mellitus with stage 2 chronic kidney disease, without long-term current use of insulin  (HCC)   A1c and uACR today. Foot exam today. Vaccines UTD. Retinal eye exam UTD. Recommend heart healthy diet such as Mediterranean diet with whole grains, fruits, vegetable, fish,  lean meats, nuts, and olive oil. Limit salt. Encouraged moderate walking, 3-5 times/week for 30-50 minutes each session. Aim for at least 150 minutes.week. Goal should be pace of 3 miles/hours, or walking 1.5 miles in 30 minutes. Seek medical care for urinary frequency, extreme thirst, vision changes, lightheadedness, dizziness.  Follow up in 3 months or sooner if needed.      Relevant Orders   Microalbumin / creatinine urine ratio   Physical exam,  annual - Primary   Today your medical history was reviewed and routine physical exam with labs was performed. Recommend 150 minutes of moderate intensity exercise weekly and consuming a well-balanced diet. Advised to stop smoking if a smoker, avoid smoking if a non-smoker, limit alcohol consumption to 1 drink per day for women and 2 drinks per day for men, and avoid illicit drug use. Counseled in mental health awareness and when to seek medical care. Vaccine maintenance discussed. Appropriate health maintenance items reviewed. Return to office in 1 year for annual physical exam.       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 No bleeding.       Relevant Orders   CBC with Differential/Platelet   Other Visit Diagnoses       Painful diabetic neuropathy (HCC)       Relevant Medications   gabapentin  (NEURONTIN ) 100 MG capsule     Encounter for screening mammogram for malignant neoplasm of breast       Relevant Orders   MM DIGITAL SCREENING BILATERAL     Colon cancer screening       Relevant Orders   Ambulatory referral to Gastroenterology     Encounter for osteoporosis screening in asymptomatic postmenopausal patient       Relevant Orders   DG Bone Density       Return for initial AWV and chronic condition management with labs 1 week prior.   Jeoffrey GORMAN Barrio, FNP

## 2024-05-18 NOTE — Assessment & Plan Note (Signed)
 Vitamin D  level drawn, continue supplement as prescribed.

## 2024-05-18 NOTE — Assessment & Plan Note (Addendum)
 Well controlled on Olmesartan  20mg  daily. Labs today. Recommend heart healthy diet such as Mediterranean diet with whole grains, fruits, vegetable, fish, lean meats, nuts, and olive oil. Limit salt. Encouraged moderate walking, 3-5 times/week for 30-50 minutes each session. Aim for at least 150 minutes.week. Goal should be pace of 3 miles/hours, or walking 1.5 miles in 30 minutes. Avoid tobacco products. Avoid excess alcohol. Take medications as prescribed and bring medications and blood pressure log with cuff to each office visit. Seek medical care for chest pain, palpitations, shortness of breath with exertion, dizziness/lightheadedness, vision changes, recurrent headaches, or swelling of extremities. Follow up in 3 months or sooner if needed.

## 2024-05-18 NOTE — Assessment & Plan Note (Signed)
 A1c and uACR today. Foot exam today. Vaccines UTD. Retinal eye exam UTD. Recommend heart healthy diet such as Mediterranean diet with whole grains, fruits, vegetable, fish, lean meats, nuts, and olive oil. Limit salt. Encouraged moderate walking, 3-5 times/week for 30-50 minutes each session. Aim for at least 150 minutes.week. Goal should be pace of 3 miles/hours, or walking 1.5 miles in 30 minutes. Seek medical care for urinary frequency, extreme thirst, vision changes, lightheadedness, dizziness.  Follow up in 3 months or sooner if needed.

## 2024-05-18 NOTE — Assessment & Plan Note (Signed)
 Counseled on importance of weight management for overall health. Encouraged low calorie, heart healthy diet and moderate intensity exercise 150 minutes weekly. This is 3-5 times weekly for 30-50 minutes each session. Goal should be pace of 3 miles/hours, or walking 1.5 miles in 30 minutes and include strength training. History of gastric bypass.

## 2024-05-18 NOTE — Assessment & Plan Note (Signed)
 CBC and B12 today, continue iron and b12 as prescribed. No bleeding.

## 2024-05-18 NOTE — Assessment & Plan Note (Signed)
 On abdominal CT 08/11/2017, HTN, HLD, DM well controlled

## 2024-05-18 NOTE — Patient Instructions (Signed)
 It was great to meet you today and I'm excited to have you join the [facility] practice. I hope you had a positive experience today! If you feel so inclined, please feel free to recommend our practice to friends and family. Kurtis Bushman, FNP-C

## 2024-05-18 NOTE — Assessment & Plan Note (Signed)

## 2024-05-19 ENCOUNTER — Ambulatory Visit: Payer: Self-pay | Admitting: Family Medicine

## 2024-05-19 LAB — COMPREHENSIVE METABOLIC PANEL WITH GFR
AG Ratio: 1.6 (calc) (ref 1.0–2.5)
ALT: 18 U/L (ref 6–29)
AST: 23 U/L (ref 10–35)
Albumin: 3.9 g/dL (ref 3.6–5.1)
Alkaline phosphatase (APISO): 94 U/L (ref 37–153)
BUN: 13 mg/dL (ref 7–25)
CO2: 27 mmol/L (ref 20–32)
Calcium: 8.8 mg/dL (ref 8.6–10.4)
Chloride: 104 mmol/L (ref 98–110)
Creat: 0.98 mg/dL (ref 0.60–1.00)
Globulin: 2.4 g/dL (ref 1.9–3.7)
Glucose, Bld: 96 mg/dL (ref 65–99)
Potassium: 4.6 mmol/L (ref 3.5–5.3)
Sodium: 138 mmol/L (ref 135–146)
Total Bilirubin: 1.3 mg/dL — ABNORMAL HIGH (ref 0.2–1.2)
Total Protein: 6.3 g/dL (ref 6.1–8.1)
eGFR: 61 mL/min/1.73m2 (ref >=60)

## 2024-05-19 LAB — HEMOGLOBIN A1C
Hgb A1c MFr Bld: 5.8 % — ABNORMAL HIGH (ref <5.7)
Mean Plasma Glucose: 120 mg/dL
eAG (mmol/L): 6.6 mmol/L

## 2024-05-19 LAB — TSH: TSH: 1.45 m[IU]/L (ref 0.40–4.50)

## 2024-05-19 LAB — CBC WITH DIFFERENTIAL/PLATELET
Absolute Lymphocytes: 2154 {cells}/uL (ref 850–3900)
Absolute Monocytes: 408 {cells}/uL (ref 200–950)
Basophils Absolute: 78 {cells}/uL (ref 0–200)
Basophils Relative: 1.3 %
Eosinophils Absolute: 306 {cells}/uL (ref 15–500)
Eosinophils Relative: 5.1 %
HCT: 39.3 % (ref 35.0–45.0)
Hemoglobin: 12.5 g/dL (ref 11.7–15.5)
MCH: 30.5 pg (ref 27.0–33.0)
MCHC: 31.8 g/dL — ABNORMAL LOW (ref 32.0–36.0)
MCV: 95.9 fL (ref 80.0–100.0)
MPV: 10.8 fL (ref 7.5–12.5)
Monocytes Relative: 6.8 %
Neutro Abs: 3054 {cells}/uL (ref 1500–7800)
Neutrophils Relative %: 50.9 %
Platelets: 286 Thousand/uL (ref 140–400)
RBC: 4.1 Million/uL (ref 3.80–5.10)
RDW: 12.3 % (ref 11.0–15.0)
Total Lymphocyte: 35.9 %
WBC: 6 Thousand/uL (ref 3.8–10.8)

## 2024-05-19 LAB — LIPID PANEL
Cholesterol: 130 mg/dL (ref <200)
HDL: 74 mg/dL (ref >=50)
LDL Cholesterol (Calc): 43 mg/dL
Non-HDL Cholesterol (Calc): 56 mg/dL (ref <130)
Total CHOL/HDL Ratio: 1.8 (calc) (ref <5.0)
Triglycerides: 53 mg/dL (ref <150)

## 2024-05-19 LAB — VITAMIN B12: Vitamin B-12: 249 pg/mL (ref 200–1100)

## 2024-05-19 LAB — VITAMIN D 25 HYDROXY (VIT D DEFICIENCY, FRACTURES): Vit D, 25-Hydroxy: 64 ng/mL (ref 30–100)

## 2024-05-19 LAB — MICROALBUMIN / CREATININE URINE RATIO
Creatinine, Urine: 106 mg/dL (ref 20–275)
Microalb Creat Ratio: 27 mg/g{creat} (ref <30)
Microalb, Ur: 2.9 mg/dL

## 2024-05-26 ENCOUNTER — Ambulatory Visit
Admission: RE | Admit: 2024-05-26 | Discharge: 2024-05-26 | Disposition: A | Discharge status: Home / Self Care | Source: Ambulatory Visit | Attending: Family Medicine | Admitting: Family Medicine

## 2024-05-26 DIAGNOSIS — Z0000 Encounter for general adult medical examination without abnormal findings: Secondary | ICD-10-CM

## 2024-05-26 DIAGNOSIS — Z1231 Encounter for screening mammogram for malignant neoplasm of breast: Secondary | ICD-10-CM

## 2024-06-01 ENCOUNTER — Other Ambulatory Visit: Payer: Self-pay | Admitting: Family Medicine

## 2024-06-01 DIAGNOSIS — R928 Other abnormal and inconclusive findings on diagnostic imaging of breast: Secondary | ICD-10-CM

## 2024-06-03 ENCOUNTER — Inpatient Hospital Stay
Admission: RE | Admit: 2024-06-03 | Discharge: 2024-06-03 | Discharge status: Home / Self Care | Source: Ambulatory Visit | Attending: Family Medicine

## 2024-06-03 ENCOUNTER — Ambulatory Visit
Admission: RE | Admit: 2024-06-03 | Discharge: 2024-06-03 | Disposition: A | Discharge status: Home / Self Care | Source: Ambulatory Visit | Attending: Family Medicine | Admitting: Family Medicine

## 2024-06-03 ENCOUNTER — Other Ambulatory Visit: Payer: Self-pay | Admitting: Family Medicine

## 2024-06-03 DIAGNOSIS — R928 Other abnormal and inconclusive findings on diagnostic imaging of breast: Secondary | ICD-10-CM

## 2024-06-05 ENCOUNTER — Telehealth: Payer: Self-pay

## 2024-06-05 ENCOUNTER — Other Ambulatory Visit: Payer: Self-pay

## 2024-06-05 DIAGNOSIS — Z860100 Personal history of colon polyps, unspecified: Secondary | ICD-10-CM

## 2024-06-05 DIAGNOSIS — K219 Gastro-esophageal reflux disease without esophagitis: Secondary | ICD-10-CM

## 2024-06-05 DIAGNOSIS — Z1211 Encounter for screening for malignant neoplasm of colon: Secondary | ICD-10-CM

## 2024-06-05 NOTE — Telephone Encounter (Signed)
 Copied from CRM #[id number]. Topic: Appointments - Scheduling Inquiry for Clinic >> Jun 05, 2024 10:34 AM Delon DASEN wrote: Reason for CRM: Need to schedule colonoscopy- Please call [phone]

## 2024-07-01 ENCOUNTER — Telehealth: Payer: Self-pay

## 2024-07-01 ENCOUNTER — Other Ambulatory Visit: Payer: Self-pay | Admitting: Family Medicine

## 2024-07-01 DIAGNOSIS — E559 Vitamin D deficiency, unspecified: Secondary | ICD-10-CM

## 2024-07-01 DIAGNOSIS — E1140 Type 2 diabetes mellitus with diabetic neuropathy, unspecified: Secondary | ICD-10-CM

## 2024-07-01 NOTE — Telephone Encounter (Unsigned)
 Copied from CRM [id number]. Topic: Clinical - Medication Refill >> Jul 01, 2024  2:00 PM Leah W wrote: Medication:  gabapentin  (NEURONTIN ) 100 MG capsule Vitamin D , Ergocalciferol , (DRISDOL ) 1.25 MG (50000 UNIT) CAPS capsule levothyroxine  (EUTHYROX ) 50 MCG tablet  Has the patient contacted their pharmacy? Yes (Agent: If no, request that the patient contact the pharmacy for the refill. If patient does not wish to contact the pharmacy document the reason why and proceed with request.) (Agent: If yes, when and what did the pharmacy advise?)  This is the patient's preferred pharmacy:  [facility] [address] [place] - [address] [place] [address] Phone: [phone] Fax: [phone]  Is this the correct pharmacy for this prescription? Yes If no, delete pharmacy and type the correct one.   Has the prescription been filled recently? No  Is the patient out of the medication? No  Has the patient been seen for an appointment in the last year OR does the patient have an upcoming appointment? Yes  Can we respond through MyChart? Yes  Agent: Please be advised that Rx refills may take up to 3 business days. We ask that you follow-up with your pharmacy.

## 2024-07-01 NOTE — Telephone Encounter (Signed)
 Sent message to pt to let her know that she does not need another colonoscopy for 5 more years.

## 2024-07-01 NOTE — Telephone Encounter (Signed)
 Copied from CRM [id number]. Topic: Referral - Request for Referral >> Jul 01, 2024  2:16 PM Amy B wrote: Did the patient discuss referral with their provider in the last year? No (If No - schedule appointment) (If Yes - send message)  Appointment offered? No  Type of order/referral and detailed reason for visit: Patient requests a referral to have a colonoscopy.  She has not had one in over 2 years.  Preference of office, provider, location:   If referral order, have you been seen by this specialty before? Yes (If Yes, this issue or another issue? When? Where?  Can we respond through MyChart? Yes

## 2024-07-02 NOTE — Telephone Encounter (Signed)
 Requested medication (s) are due for refill today:   Yes except for gabapentin .   It's being requested too soon.  Requested medication (s) are on the active medication list:   Yes for all 3  Future visit scheduled:   Yes 10/21   Last ordered: gabapentin  05/18/2024 #90, 1 refill;   Vitamin D  (Drisdol ) 01/28/2023 #24, 3 refills;   Synthroid  02/28/2024 #90, 0 refills    Unable to refill because of non delegated Drisdol     Requested Prescriptions  Pending Prescriptions Disp Refills   gabapentin  (NEURONTIN ) 100 MG capsule      Sig: Nightly for neuropathic pain     Neurology: Anticonvulsants - gabapentin  Passed - 07/02/2024 11:47 AM      Passed - Cr in normal range and within 360 days    Creat  Date Value Ref Range Status  05/18/2024 0.98 0.60 - 1.00 mg/dL Final   Creatinine, Urine  Date Value Ref Range Status  05/18/2024 106 20 - 275 mg/dL Final         Passed - Completed PHQ-2 or PHQ-9 in the last 360 days      Passed - Valid encounter within last 12 months    Recent Outpatient Visits           1 month ago Physical exam, annual    [facility] Kayla, [facility] doctor S, FNP               Vitamin D , Ergocalciferol , (DRISDOL ) 1.25 MG (50000 UNIT) CAPS capsule 24 capsule 3    Sig: Take 1 capsule twice weekly for Severe Vitamin D  Deficiency                                                                               /                                                          take                                         by                                        mouth.     Endocrinology:  Vitamins - Vitamin D  Supplementation 2 Failed - 07/02/2024 11:47 AM      Failed - Manual Review: Route requests for 50,000 IU strength to the provider      Passed - Ca in normal range and within 360 days    Calcium   Date Value Ref Range Status  05/18/2024 8.8 8.6 - 10.4 mg/dL Final         Passed - Vitamin D  in normal range and within 360 days    Vit D, 25-Hydroxy  Date Value  Ref Range Status  05/18/2024 64  30 - 100 ng/mL Final    Comment:    Vitamin D  Status         25-OH Vitamin D : . Deficiency:                    <20 ng/mL Insufficiency:             20 - 29 ng/mL Optimal:                 > or = 30 ng/mL . For 25-OH Vitamin D  testing on patients on  D2-supplementation and patients for whom quantitation  of D2 and D3 fractions is required, the [employer](TM) 25-OH VIT D, (D2,D3), LC/MS/MS is recommended: order  code 07111 (patients >74yrs). . We received a handwritten test order for Vitamin D  and performed 82693, Vitamin D , 25-OH,Total,IA test. If this is not what you intended to order, please contact your local client services representative immediately so that we may adjust our billing appropriately. You may also inquire about alternative or additional testing. . For additional information, please refer to http://education.[facility].com/faq/FAQ199 (This link is being provided for informational/ educational purposes only.)          Passed - Valid encounter within last 12 months    Recent Outpatient Visits           1 month ago Physical exam, annual   [facility] Kayla Jeoffrey RAMAN, FNP               levothyroxine  (EUTHYROX ) 50 MCG tablet 90 tablet 0    Sig: Take  1 tablet  Daily  on an empty stomach with only water for 30 minutes & no Antacid meds, Calcium  or Magnesium  for 4 hours & avoid Biotin      Endocrinology:  Hypothyroid Agents Passed - 07/02/2024 11:47 AM      Passed - TSH in normal range and within 360 days    TSH  Date Value Ref Range Status  05/18/2024 1.45 0.40 - 4.50 mIU/L Final         Passed - Valid encounter within last 12 months    Recent Outpatient Visits           1 month ago Physical exam, annual   [facility] Kayla Jeoffrey RAMAN, FNP

## 2024-07-13 ENCOUNTER — Other Ambulatory Visit: Payer: Self-pay | Admitting: Family Medicine

## 2024-07-13 DIAGNOSIS — E559 Vitamin D deficiency, unspecified: Secondary | ICD-10-CM

## 2024-07-13 DIAGNOSIS — E1140 Type 2 diabetes mellitus with diabetic neuropathy, unspecified: Secondary | ICD-10-CM

## 2024-07-13 NOTE — Telephone Encounter (Unsigned)
 Copied from CRM [id number]. Topic: Clinical - Medication Refill >> Jul 13, 2024 10:19 AM Tobias L wrote: Medication: gabapentin  (NEURONTIN ) 100 MG capsule - Showing sent on 05/18/2024 Class: No Print, patient spoke to pharmacy and they do not have refill for prescription. Requesting refills.  Vitamin D , Ergocalciferol , (DRISDOL ) 1.25 MG (50000 UNIT) CAPS capsule levothyroxine  (EUTHYROX ) 50 MCG tablet - For Vitamin D  and levothyroxine , they were previously prescribed by Dr. Tonita, requesting for Amber to send in refills and take over prescription.  Has the patient contacted their pharmacy? Yes  This is the patient's preferred pharmacy:  [facility] [address] [place] - [address] [place] [address] Phone: [phone] Fax: [phone]  Is this the correct pharmacy for this prescription? Yes  Has the prescription been filled recently? No  Is the patient out of the medication? No  Has the patient been seen for an appointment in the last year OR does the patient have an upcoming appointment? Yes  Can we respond through MyChart? Yes  Agent: Please be advised that Rx refills may take up to 3 business days. We ask that you follow-up with your pharmacy.

## 2024-07-14 NOTE — Telephone Encounter (Signed)
 Requested medication (s) are due for refill today: yes to the Vitamin D  and levothyroxine ,and ? Gabapentin   Requested medication (s) are on the active medication list: yes to all   Last refill:  Vitamin D : 01/28/23 #24 3 RF Levothyroxine : 02/28/24 #90 Gabapentin  dated 05/18/24 #90 1  but the class is no print   Future visit scheduled: yes  Notes to clinic:  Vitamin D  not delegated to NT to RF//Pt wanting Jeoffrey Barrio to prescribe meds from now on   Requested Prescriptions  Pending Prescriptions Disp Refills   Vitamin D , Ergocalciferol , (DRISDOL ) 1.25 MG (50000 UNIT) CAPS capsule 24 capsule     Sig: Take 1 capsule twice weekly for Severe Vitamin D  Deficiency                                                                               /                                                          take                                         by                                        mouth.     Endocrinology:  Vitamins - Vitamin D  Supplementation 2 Failed - 07/14/2024  3:43 PM      Failed - Manual Review: Route requests for 50,000 IU strength to the provider      Passed - Ca in normal range and within 360 days    Calcium   Date Value Ref Range Status  05/18/2024 8.8 8.6 - 10.4 mg/dL Final         Passed - Vitamin D  in normal range and within 360 days    Vit D, 25-Hydroxy  Date Value Ref Range Status  05/18/2024 64 30 - 100 ng/mL Final    Comment:    Vitamin D  Status         25-OH Vitamin D : . Deficiency:                    <20 ng/mL Insufficiency:             20 - 29 ng/mL Optimal:                 > or = 30 ng/mL . For 25-OH Vitamin D  testing on patients on  D2-supplementation and patients for whom quantitation  of D2 and D3 fractions is required, the [facility](TM) 25-OH VIT D, (D2,D3), LC/MS/MS is recommended: order  code 07111 (patients >32yrs). . We received a handwritten test order for Vitamin D  and performed 82693, Vitamin D , 25-OH,Total,IA test. If this is not what you  intended to order, please contact your  local client services representative immediately so that we may adjust our billing appropriately. You may also inquire about alternative or additional testing. . For additional information, please refer to http://education.[facility].com/faq/FAQ199 (This link is being provided for informational/ educational purposes only.)          Passed - Valid encounter within last 12 months    Recent Outpatient Visits           1 month ago Physical exam, annual   [place] [facility] Kayla Jeoffrey RAMAN, FNP               levothyroxine  (EUTHYROX ) 50 MCG tablet 90 tablet     Sig: Take  1 tablet  Daily  on an empty stomach with only water for 30 minutes & no Antacid meds, Calcium  or Magnesium  for 4 hours & avoid Biotin      Endocrinology:  Hypothyroid Agents Passed - 07/14/2024  3:43 PM      Passed - TSH in normal range and within 360 days    TSH  Date Value Ref Range Status  05/18/2024 1.45 0.40 - 4.50 mIU/L Final         Passed - Valid encounter within last 12 months    Recent Outpatient Visits           1 month ago Physical exam, annual   [place] [facility] Kayla Jeoffrey RAMAN, FNP               gabapentin  (NEURONTIN ) 100 MG capsule      Sig: Nightly for neuropathic pain     Neurology: Anticonvulsants - gabapentin  Passed - 07/14/2024  3:43 PM      Passed - Cr in normal range and within 360 days    Creat  Date Value Ref Range Status  05/18/2024 0.98 0.60 - 1.00 mg/dL Final   Creatinine, Urine  Date Value Ref Range Status  05/18/2024 106 20 - 275 mg/dL Final         Passed - Completed PHQ-2 or PHQ-9 in the last 360 days      Passed - Valid encounter within last 12 months    Recent Outpatient Visits           1 month ago Physical exam, annual   [place] [facility] Kayla Jeoffrey RAMAN, FNP

## 2024-07-17 ENCOUNTER — Telehealth: Payer: Self-pay

## 2024-07-17 ENCOUNTER — Other Ambulatory Visit: Payer: Self-pay

## 2024-07-17 DIAGNOSIS — E559 Vitamin D deficiency, unspecified: Secondary | ICD-10-CM

## 2024-07-17 DIAGNOSIS — E1140 Type 2 diabetes mellitus with diabetic neuropathy, unspecified: Secondary | ICD-10-CM

## 2024-07-17 DIAGNOSIS — E038 Other specified hypothyroidism: Secondary | ICD-10-CM

## 2024-07-17 MED ORDER — VITAMIN D (ERGOCALCIFEROL) 1.25 MG (50000 UNIT) PO CAPS
ORAL_CAPSULE | ORAL | 3 refills | Status: AC
Start: 2024-07-17 — End: ?

## 2024-07-17 MED ORDER — GABAPENTIN 100 MG PO CAPS
ORAL_CAPSULE | ORAL | 1 refills | Status: AC
Start: 2024-07-17 — End: ?

## 2024-07-17 MED ORDER — LEVOTHYROXINE SODIUM 50 MCG PO TABS
ORAL_TABLET | ORAL | 1 refills | Status: AC
Start: 2024-07-17 — End: ?

## 2024-07-17 NOTE — Telephone Encounter (Signed)
 Copied from CRM [id number]. Topic: Clinical - Prescription Issue >> Jul 17, 2024  9:35 AM Delon DASEN wrote: Reason for CRM: Vitamin D , Ergocalciferol , (DRISDOL ) 1.25 MG (50000 UNIT) CAPS capsule gabapentin  (NEURONTIN ) 100 MG capsule levothyroxine  (EUTHYROX ) 50 MCG tablet  Patient is still waiting for refills, does not see the original prescribing doctors for these meds. Please call with any issues- [phone]

## 2024-07-22 ENCOUNTER — Ambulatory Visit: Payer: Federal, State, Local not specified - PPO | Admitting: Nurse Practitioner

## 2024-07-29 ENCOUNTER — Ambulatory Visit: Payer: Federal, State, Local not specified - PPO | Admitting: Nurse Practitioner

## 2024-08-04 ENCOUNTER — Ambulatory Visit (INDEPENDENT_AMBULATORY_CARE_PROVIDER_SITE_OTHER): Admitting: Family Medicine

## 2024-08-04 ENCOUNTER — Encounter: Payer: Self-pay | Admitting: Family Medicine

## 2024-08-04 VITALS — BP 115/62 | HR 52 | Temp 97.5°F | Ht 62.75 in | Wt 160.0 lb

## 2024-08-04 DIAGNOSIS — R197 Diarrhea, unspecified: Secondary | ICD-10-CM | POA: Diagnosis not present

## 2024-08-04 DIAGNOSIS — I2510 Atherosclerotic heart disease of native coronary artery without angina pectoris: Secondary | ICD-10-CM | POA: Diagnosis not present

## 2024-08-04 DIAGNOSIS — R42 Dizziness and giddiness: Secondary | ICD-10-CM | POA: Insufficient documentation

## 2024-08-04 LAB — CBC WITH DIFFERENTIAL/PLATELET
Absolute Lymphocytes: 1833 {cells}/uL (ref 850–3900)
Absolute Monocytes: 466 {cells}/uL (ref 200–950)
Basophils Absolute: 50 {cells}/uL (ref 0–200)
Basophils Relative: 0.8 %
Eosinophils Absolute: 227 {cells}/uL (ref 15–500)
Eosinophils Relative: 3.6 %
HCT: 36.6 % (ref 35.0–45.0)
Hemoglobin: 11.7 g/dL (ref 11.7–15.5)
MCH: 30.5 pg (ref 27.0–33.0)
MCHC: 32 g/dL (ref 32.0–36.0)
MCV: 95.6 fL (ref 80.0–100.0)
MPV: 10.6 fL (ref 7.5–12.5)
Monocytes Relative: 7.4 %
Neutro Abs: 3723 {cells}/uL (ref 1500–7800)
Neutrophils Relative %: 59.1 %
Platelets: 241 Thousand/uL (ref 140–400)
RBC: 3.83 Million/uL (ref 3.80–5.10)
RDW: 12.3 % (ref 11.0–15.0)
Total Lymphocyte: 29.1 %
WBC: 6.3 Thousand/uL (ref 3.8–10.8)

## 2024-08-04 LAB — COMPREHENSIVE METABOLIC PANEL WITH GFR
AG Ratio: 1.5 (calc) (ref 1.0–2.5)
ALT: 12 U/L (ref 6–29)
AST: 15 U/L (ref 10–35)
Albumin: 3.8 g/dL (ref 3.6–5.1)
Alkaline phosphatase (APISO): 105 U/L (ref 37–153)
BUN: 12 mg/dL (ref 7–25)
CO2: 27 mmol/L (ref 20–32)
Calcium: 9.1 mg/dL (ref 8.6–10.4)
Chloride: 104 mmol/L (ref 98–110)
Creat: 0.86 mg/dL (ref 0.60–1.00)
Globulin: 2.5 g/dL (ref 1.9–3.7)
Glucose, Bld: 89 mg/dL (ref 65–99)
Potassium: 4.8 mmol/L (ref 3.5–5.3)
Sodium: 137 mmol/L (ref 135–146)
Total Bilirubin: 1.7 mg/dL — ABNORMAL HIGH (ref 0.2–1.2)
Total Protein: 6.3 g/dL (ref 6.1–8.1)
eGFR: 72 mL/min/1.73m2 (ref >=60)

## 2024-08-04 MED ORDER — NITROGLYCERIN 0.4 MG SL SUBL
0.4000 mg | SUBLINGUAL_TABLET | SUBLINGUAL | 0 refills | Status: AC | PRN
Start: 2024-08-04 — End: ?

## 2024-08-04 NOTE — Progress Notes (Signed)
 Subjective:  HPI: Tracy Paul is a 73 y.o. female presenting on 08/04/2024 for Acute Visit (Not feeling well HR elevated feeling dizzy , coldness, feels as if she is walking sidways, states her face feels tingly. Thomasenia like flu shot today )   HPI Patient is in today for not feeling good at all described as dizziness, lightheadedness, off balance, face tingling all over for past few weeks. This has been happening intermittently just about every day. Is also having diarrhea 2-3 times daily for a couple weeks. Is tolerating PO intake. Her HR has been going up to the 80s, which is above her baseline 40s.  Denies chest pain, palpitations, fever, chills, body aches, hematochezia, melena, vomiting, unilateral weakness, AMS, slurred speech. No new medications, sick exposures, or recent travel. No headache, vision changes or hearing loss. The dizziness is not positional, sounds more like lightheadedness, no triggers identified. She does endorse some abdominal cramping. Has tried nothing for the diarrhea. Bowel movements were previously soft and brown and she would have 0-1 BMs daily. Now her stools are watery, brown, 2-3 times daily.   Review of Systems  All other systems reviewed and are negative.   Relevant past medical history reviewed and updated as indicated.   Past Medical History:  Diagnosis Date   Allergic rhinitis    Arthritis    Cholecystitis, chronic    Dysrhythmia    Hx bradycardia - hx normal cardiac cath x2 / ECHO 2009 EF 60% LAE MYOVIEW  3/12 EF 60% NO ISCHEMIA   Gastrointestinal ulcer due to Helicobacter pylori 10/14/2017   Hyperlipidemia    Hypertension    no treatment after 120 lb weight loss   Hypothyroidism    Prediabetes    Thyroid  nodule      Past Surgical History:  Procedure Laterality Date   Bilateral carpal tunnel surgery     BREATH TEK H PYLORI  07/01/2012   Procedure: BREATH TEK H PYLORI;  Surgeon: Morene ONEIDA Olives, MD;  Location: [facility];   Service: General;  Laterality: N/A;   CHOLECYSTECTOMY N/A 11/27/2013   Procedure: LAPAROSCOPIC CHOLECYSTECTOMY WITH INTRAOPERATIVE CHOLANGIOGRAM;  Surgeon: Donnice KATHEE Lunger, MD;  Location: [facility];  Service: General;  Laterality: N/A;   GASTRIC ROUX-EN-Y N/A 12/16/2012   Procedure: LAPAROSCOPIC ROUX-EN-Y GASTRIC BYPASS WITH UPPER ENDOSCOPY;  Surgeon: Donnice KATHEE Lunger, MD;  Location: [facility];  Service: General;  Laterality: N/A;  Gastric Bypass   NASAL SINUS SURGERY     REPLACEMENT TOTAL KNEE BILATERAL     bilat   TUBAL LIGATION      Allergies and medications reviewed and updated.   Current Outpatient Medications:    Ascorbic Acid (VITAMIN C) 500 MG CAPS, Take 1 tablet by mouth daily. , Disp: , Rfl:    aspirin  81 MG tablet, Take 81 mg by mouth daily., Disp: , Rfl:    atorvastatin  (LIPITOR) 20 MG tablet, TAKE 1 TABLET BY MOUTH ONCE DAILY FOR CHOLESTEROL, Disp: 90 tablet, Rfl: 0   Calcium  Citrate-Vitamin D  (CALCIUM  CITRATE + D3 PO), Take 600 mg by mouth daily. , Disp: , Rfl:    diphenhydramine -acetaminophen  (TYLENOL  PM) 25-500 MG TABS tablet, Take 1 tablet by mouth at bedtime as needed., Disp: , Rfl:    escitalopram  (LEXAPRO ) 5 MG tablet, Take 1 tab daily for mood., Disp: 90 tablet, Rfl: 0   esomeprazole  (NEXIUM ) 40 MG capsule, Take 1 capsule Daily for Heartburn & Indigestion, Disp: 90 capsule, Rfl: 1   Ferrous Sulfate  (SLOW RELEASE IRON  PO), Take 1 tablet by mouth daily. , Disp: , Rfl:    fish oil-omega-3 fatty acids 1000 MG capsule, Take 1 g by mouth daily., Disp: , Rfl:    fluticasone  (FLONASE ) 50 MCG/ACT nasal spray, Place 2 sprays into both nostrils daily as needed for allergies., Disp: , Rfl:    gabapentin  (NEURONTIN ) 100 MG capsule, Nightly for neuropathic pain, Disp: 90 capsule, Rfl: 1   levothyroxine  (EUTHYROX ) 50 MCG tablet, Take  1 tablet  Daily  on an empty stomach with only water for 30 minutes & no Antacid meds, Calcium  or Magnesium  for 4 hours & avoid Biotin , Disp: 90 tablet, Rfl:  1   Magnesium  250 MG TABS, Take 1 tablet by mouth daily. , Disp: , Rfl:    Melatonin 10 MG TABS, Take 1 tablet by mouth at bedtime., Disp: , Rfl:    Naproxen Sodium (ALEVE PO), Take by mouth., Disp: , Rfl:    olmesartan  (BENICAR ) 20 MG tablet, Take 1 tablet by mouth once daily for blood pressure, Disp: 90 tablet, Rfl: 0   potassium chloride  (K-DUR) 10 MEQ tablet, Take 1 tablet 2 x /day, Disp: 180 tablet, Rfl: 1   rOPINIRole  (REQUIP ) 3 MG tablet, Take  1 tablet   3 x /day  as needed  for Restless Legs, Disp: 270 tablet, Rfl: 3   vitamin B-12 (CYANOCOBALAMIN ) 1000 MCG tablet, Take 1,000 mcg by mouth daily. , Disp: , Rfl:    Vitamin D , Ergocalciferol , (DRISDOL ) 1.25 MG (50000 UNIT) CAPS capsule, Take 1 capsule twice weekly for Severe Vitamin D  Deficiency                                                                               /                                                          take                                         by                                        mouth., Disp: 24 capsule, Rfl: 3   nitroGLYCERIN  (NITROSTAT ) 0.4 MG SL tablet, Place 1 tablet (0.4 mg total) under the tongue every 5 (five) minutes as needed for chest pain., Disp: 30 tablet, Rfl: 0  Allergies  Allergen Reactions   Codeine Other (See Comments)    puts me out of it.  Dysphoria.  Patient tolerates Tramadol  without complications.   Flagyl  [Metronidazole ] Hives   Nabumetone Other (See Comments)    GI upset    Objective:   BP 115/62   Pulse (!) 52   Temp (!) 97.5 F (36.4 C)   Ht 5' 2.75 (1.594 m)   Wt 160 lb  0.3 oz (72.6 kg)   LMP  (LMP Unknown)   SpO2 98%   BMI 28.57 kg/m      08/04/2024    8:37 AM 05/18/2024    8:04 AM 02/28/2024    9:01 AM  Vitals with BMI  Height 5' 2.75 5' 2.75 5' 2.75  Weight 160 lbs 159 lbs 6 oz 157 lbs 3 oz  BMI 28.57 28.45 28.06  Systolic 115 124 873  Diastolic 62 78 82  Pulse 52 61 50     Physical Exam Vitals and nursing note reviewed.  Constitutional:       Appearance: Normal appearance. She is normal weight.  HENT:     Head: Normocephalic and atraumatic.     Right Ear: Tympanic membrane, ear canal and external ear normal.     Left Ear: Tympanic membrane, ear canal and external ear normal.  Cardiovascular:     Rate and Rhythm: Normal rate and regular rhythm.     Pulses: Normal pulses.     Heart sounds: Normal heart sounds.  Pulmonary:     Effort: Pulmonary effort is normal.     Breath sounds: Normal breath sounds.  Abdominal:     General: Abdomen is flat. Bowel sounds are normal.     Palpations: Abdomen is soft.     Tenderness: There is no abdominal tenderness.  Skin:    General: Skin is warm and dry.  Neurological:     General: No focal deficit present.     Mental Status: She is alert and oriented to person, place, and time. Mental status is at baseline.  Psychiatric:        Mood and Affect: Mood normal.        Behavior: Behavior normal.        Thought Content: Thought content normal.        Judgment: Judgment normal.     Assessment & Plan:  Intermittent lightheadedness Assessment & Plan: EKG sinus brady. No red flags on exam. I believe this is due to dehydration resulting from her diarrhea. Will check CBC to rule out anemia and CMP. Stool studies as well and encouraged to take imodium and push electrolyte fluids.  Orders: -     CBC with Differential/Platelet -     Comprehensive metabolic panel with GFR -     EKG 12-Lead  Coronary artery disease involving native coronary artery of native heart without angina pectoris Assessment & Plan: Denies chest pain. Does request refill for nitroglycerin  tablets.  Orders: -     Nitroglycerin ; Place 1 tablet (0.4 mg total) under the tongue every 5 (five) minutes as needed for chest pain.  Dispense: 30 tablet; Refill: 0 -     EKG 12-Lead  Diarrhea, unspecified type Assessment & Plan: Unknown etiology. Will check CBC to rule out anemia and CMP due to lightheadedness. Stool studies as  well and encouraged to take imodium and push electrolyte fluids. Follow up if symptoms persist. Scheduled for colonoscopy upcoming.  Orders: -     CBC with Differential/Platelet -     Comprehensive metabolic panel with GFR -     Clostridium difficile culture-fecal -     Fecal Globin By Immunochemistry -     Gastrointestinal Pathogen Pnl RT, PCR -     Ova and parasite examination     Follow up plan: Return if symptoms worsen or fail to improve.  Jeoffrey GORMAN Barrio, FNP

## 2024-08-04 NOTE — Assessment & Plan Note (Signed)
 Unknown etiology. Will check CBC to rule out anemia and CMP due to lightheadedness. Stool studies as well and encouraged to take imodium and push electrolyte fluids. Follow up if symptoms persist. Scheduled for colonoscopy upcoming.

## 2024-08-04 NOTE — Assessment & Plan Note (Signed)
 EKG sinus brady. No red flags on exam. I believe this is due to dehydration resulting from her diarrhea. Will check CBC to rule out anemia and CMP. Stool studies as well and encouraged to take imodium and push electrolyte fluids.

## 2024-08-04 NOTE — Assessment & Plan Note (Signed)
 Denies chest pain. Does request refill for nitroglycerin  tablets.

## 2024-08-11 LAB — FECAL GLOBIN BY IMMUNOCHEMISTRY
FECAL GLOBIN RESULT:: NOT DETECTED
MICRO NUMBER:: 17084051
SPECIMEN QUALITY:: ADEQUATE

## 2024-08-13 ENCOUNTER — Ambulatory Visit: Payer: Self-pay | Admitting: Family Medicine

## 2024-08-13 LAB — GASTROINTESTINAL PATHOGEN PNL
CampyloBacter Group: NOT DETECTED
Norovirus GI/GII: NOT DETECTED
Rotavirus A: NOT DETECTED
Salmonella species: NOT DETECTED
Shiga Toxin 1: NOT DETECTED
Shiga Toxin 2: NOT DETECTED
Shigella Species: NOT DETECTED
Vibrio Group: NOT DETECTED
Yersinia enterocolitica: NOT DETECTED

## 2024-08-13 LAB — OVA AND PARASITE EXAMINATION
CONCENTRATE RESULT:: NONE SEEN
MICRO NUMBER:: 17084314
SPECIMEN QUALITY:: ADEQUATE
TRICHROME RESULT:: NONE SEEN

## 2024-08-13 LAB — CLOSTRIDIUM DIFFICILE CULTURE-FECAL

## 2024-08-18 ENCOUNTER — Encounter: Payer: Self-pay | Admitting: Family Medicine

## 2024-08-18 ENCOUNTER — Ambulatory Visit: Admitting: Family Medicine

## 2024-08-18 ENCOUNTER — Telehealth: Payer: Self-pay | Admitting: Family Medicine

## 2024-08-18 VITALS — BP 140/78 | HR 51 | Temp 98.5°F | Ht 62.75 in | Wt 164.2 lb

## 2024-08-18 DIAGNOSIS — I10 Essential (primary) hypertension: Secondary | ICD-10-CM | POA: Diagnosis not present

## 2024-08-18 DIAGNOSIS — D508 Other iron deficiency anemias: Secondary | ICD-10-CM

## 2024-08-18 DIAGNOSIS — Z0000 Encounter for general adult medical examination without abnormal findings: Secondary | ICD-10-CM

## 2024-08-18 DIAGNOSIS — Z0001 Encounter for general adult medical examination with abnormal findings: Secondary | ICD-10-CM

## 2024-08-18 DIAGNOSIS — N182 Chronic kidney disease, stage 2 (mild): Secondary | ICD-10-CM

## 2024-08-18 DIAGNOSIS — I700 Atherosclerosis of aorta: Secondary | ICD-10-CM

## 2024-08-18 DIAGNOSIS — E1122 Type 2 diabetes mellitus with diabetic chronic kidney disease: Secondary | ICD-10-CM

## 2024-08-18 DIAGNOSIS — K9189 Other postprocedural complications and disorders of digestive system: Secondary | ICD-10-CM

## 2024-08-18 DIAGNOSIS — Z23 Encounter for immunization: Secondary | ICD-10-CM | POA: Diagnosis not present

## 2024-08-18 DIAGNOSIS — E038 Other specified hypothyroidism: Secondary | ICD-10-CM | POA: Diagnosis not present

## 2024-08-18 DIAGNOSIS — E785 Hyperlipidemia, unspecified: Secondary | ICD-10-CM

## 2024-08-18 NOTE — Assessment & Plan Note (Signed)
 On abdominal CT 08/11/2017, HTN, HLD, DM well controlled

## 2024-08-18 NOTE — Assessment & Plan Note (Signed)
 Continue Olmesartan  20mg  daily. Counseled on importance of mediation adherence. Will monitor at home over next week and return to office for BP check in 1 week. Recommend heart healthy diet such as Mediterranean diet with whole grains, fruits, vegetable, fish, lean meats, nuts, and olive oil. Limit salt. Encouraged moderate walking, 3-5 times/week for 30-50 minutes each session. Aim for at least 150 minutes.week. Goal should be pace of 3 miles/hours, or walking 1.5 miles in 30 minutes. Avoid tobacco products. Avoid excess alcohol. Take medications as prescribed and bring medications and blood pressure log with cuff to each office visit. Seek medical care for chest pain, palpitations, shortness of breath with exertion, dizziness/lightheadedness, vision changes, recurrent headaches, or swelling of extremities. Follow up in 3 months or sooner if needed.

## 2024-08-18 NOTE — Telephone Encounter (Signed)
 Patient need lab orders.

## 2024-08-18 NOTE — Assessment & Plan Note (Signed)
 On Atorvastatin  20mg  daily without side effects. I recommend consuming a heart healthy diet such as Mediterranean diet or DASH diet with whole grains, fruits, vegetable, fish, lean meats, nuts, and olive oil. Limit sweets and processed foods. I also encourage moderate intensity exercise 150 minutes weekly. This is 3-5 times weekly for 30-50 minutes each session. Goal should be pace of 3 miles/hours, or walking 1.5 miles in 30 minutes. The 10-year ASCVD risk score (Arnett DK, et al., 2019) is: 29.1%

## 2024-08-18 NOTE — Patient Instructions (Signed)
  [name] , Thank you for taking time to come for your Medicare Wellness Visit. I appreciate your ongoing commitment to your health goals. Please review the following plan we discussed and let me know if I can assist you in the future.   These are the goals we discussed:  Goals      Activity and Exercise Increased     Evidence-based guidance:  Review current exercise levels.  Assess patient perspective on exercise or activity level, barriers to increasing activity, motivation and readiness for change.  Recommend or set healthy exercise goal based on individual tolerance.  Encourage small steps toward making change in amount of exercise or activity.  Urge reduction of sedentary activities or screen time.  Promote group activities within the community or with family or support person.  Consider referral to rehabiliation therapist for assessment and exercise/activity plan.   Notes: pt reports she is going to start going to the gym next week with a partner.      Blood Pressure < 130/80     DIET - REDUCE SUGAR INTAKE     Exercise 150 min/wk Moderate Activity     Aim for at least 15 min of brisk walking daily      LDL CALC < 70     Weight (lb) < 160 lb (72.6 kg)        This is a list of the screening recommended for you and due dates:  Health Maintenance  Topic Date Due   Eye exam for diabetics  01/04/2023   Hemoglobin A1C  11/18/2024   Yearly kidney health urinalysis for diabetes  05/18/2025   Complete foot exam   05/18/2025   Yearly kidney function blood test for diabetes  08/04/2025   Medicare Annual Wellness Visit  08/18/2025   DTaP/Tdap/Td vaccine (3 - Td or Tdap) 05/23/2026   Breast Cancer Screening  05/26/2026   Colon Cancer Screening  06/05/2029   Pneumococcal Vaccine for age over 17  Completed   Flu Shot  Completed   DEXA scan (bone density measurement)  Completed   Hepatitis C Screening  Completed   Zoster (Shingles) Vaccine  Completed   Meningitis B Vaccine  Aged  Out   COVID-19 Vaccine  Discontinued

## 2024-08-18 NOTE — Assessment & Plan Note (Signed)
 Continue Iron as prescribed, CBC stable

## 2024-08-18 NOTE — Progress Notes (Signed)
 Subjective:  HPI: Tracy Paul is a 73 y.o. female presenting on 08/18/2024 for Medical Management of Chronic Issues (Medicare annual wellness initial visit. /Would like flu shot today )   HPI Patient is in today for AWV and chronic condition management. Tracy Paul has a PMH includes HTN, DM2, RLS, aortic atherosclerosis, Vitamin D  Deficiency, IDA, and Hypothyroidism.   HTN: elevated today, has not taken her medication today, admits to forgetting sometimes, monitoring occasionally at home, followed by cardiology, on Olmesartan  20mg  daily Denies chest pain, palpitations, recurrent headaches, vision changes, lightheadedness, dizziness, dyspnea on exertion, or swelling of extremities.   HLD: controlled on Atorvastatin  20mg  daily, heart healthy diet Lipid Panel     Component Value Date/Time   CHOL 130 05/18/2024 0838   TRIG 53 05/18/2024 0838   HDL 74 05/18/2024 0838   CHOLHDL 1.8 05/18/2024 0838   VLDL 7 08/21/2018 0519   LDLCALC 43 05/18/2024 0838     DM2: borderline prior to bypass, not monitored Lab Results  Component Value Date   HGBA1C 5.8 (H) 05/18/2024   HGBA1C 5.7 (H) 10/03/2023   HGBA1C 5.9 (H) 05/07/2023    Aortic atherosclerosis: on CT 2018   Anemia: on ferrous sulfate  daily Hemoglobin & Hematocrit     Component Value Date/Time   HGB 11.7 08/04/2024 0909   HCT 36.6 08/04/2024 0909    Hypothyroidism: euthyroid, well controlled on Synthroid  50mcg daily  Review of Systems  All other systems reviewed and are negative.   Relevant past medical history reviewed and updated as indicated.   Past Medical History:  Diagnosis Date   Allergic rhinitis    Arthritis    Cholecystitis, chronic    Dysrhythmia    Hx bradycardia - hx normal cardiac cath x2 / ECHO 2009 EF 60% LAE MYOVIEW  3/12 EF 60% NO ISCHEMIA   Gastrointestinal ulcer due to Helicobacter pylori 10/14/2017   Hyperlipidemia    Hypertension    no treatment after 120 lb weight loss   Hypothyroidism     Prediabetes    Thyroid  nodule      Past Surgical History:  Procedure Laterality Date   Bilateral carpal tunnel surgery     BREATH TEK H PYLORI  07/01/2012   Procedure: BREATH TEK H PYLORI;  Surgeon: Morene ONEIDA Olives, MD;  Location: [facility];  Service: General;  Laterality: N/A;   CHOLECYSTECTOMY N/A 11/27/2013   Procedure: LAPAROSCOPIC CHOLECYSTECTOMY WITH INTRAOPERATIVE CHOLANGIOGRAM;  Surgeon: Donnice KATHEE Lunger, MD;  Location: [facility];  Service: General;  Laterality: N/A;   GASTRIC ROUX-EN-Y N/A 12/16/2012   Procedure: LAPAROSCOPIC ROUX-EN-Y GASTRIC BYPASS WITH UPPER ENDOSCOPY;  Surgeon: Donnice KATHEE Lunger, MD;  Location: [facility];  Service: General;  Laterality: N/A;  Gastric Bypass   NASAL SINUS SURGERY     REPLACEMENT TOTAL KNEE BILATERAL     bilat   TUBAL LIGATION      Allergies and medications reviewed and updated.   Current Outpatient Medications:    Ascorbic Acid (VITAMIN C) 500 MG CAPS, Take 1 tablet by mouth daily. , Disp: , Rfl:    aspirin  81 MG tablet, Take 81 mg by mouth daily., Disp: , Rfl:    atorvastatin  (LIPITOR) 20 MG tablet, TAKE 1 TABLET BY MOUTH ONCE DAILY FOR CHOLESTEROL, Disp: 90 tablet, Rfl: 0   Calcium  Citrate-Vitamin D  (CALCIUM  CITRATE + D3 PO), Take 600 mg by mouth daily. , Disp: , Rfl:    diphenhydramine -acetaminophen  (TYLENOL  PM) 25-500 MG TABS tablet, Take 1 tablet by mouth  at bedtime as needed., Disp: , Rfl:    escitalopram  (LEXAPRO ) 5 MG tablet, Take 1 tab daily for mood., Disp: 90 tablet, Rfl: 0   esomeprazole  (NEXIUM ) 40 MG capsule, Take 1 capsule Daily for Heartburn & Indigestion, Disp: 90 capsule, Rfl: 1   Ferrous Sulfate  (SLOW RELEASE IRON PO), Take 1 tablet by mouth daily. , Disp: , Rfl:    fish oil-omega-3 fatty acids 1000 MG capsule, Take 1 g by mouth daily., Disp: , Rfl:    fluticasone  (FLONASE ) 50 MCG/ACT nasal spray, Place 2 sprays into both nostrils daily as needed for allergies., Disp: , Rfl:    gabapentin  (NEURONTIN ) 100 MG capsule,  Nightly for neuropathic pain, Disp: 90 capsule, Rfl: 1   levothyroxine  (EUTHYROX ) 50 MCG tablet, Take  1 tablet  Daily  on an empty stomach with only water for 30 minutes & no Antacid meds, Calcium  or Magnesium  for 4 hours & avoid Biotin , Disp: 90 tablet, Rfl: 1   Magnesium  250 MG TABS, Take 1 tablet by mouth daily. , Disp: , Rfl:    Melatonin 10 MG TABS, Take 1 tablet by mouth at bedtime., Disp: , Rfl:    Naproxen Sodium (ALEVE PO), Take by mouth., Disp: , Rfl:    nitroGLYCERIN  (NITROSTAT ) 0.4 MG SL tablet, Place 1 tablet (0.4 mg total) under the tongue every 5 (five) minutes as needed for chest pain., Disp: 30 tablet, Rfl: 0   olmesartan  (BENICAR ) 20 MG tablet, Take 1 tablet by mouth once daily for blood pressure, Disp: 90 tablet, Rfl: 0   potassium chloride  (K-DUR) 10 MEQ tablet, Take 1 tablet 2 x /day, Disp: 180 tablet, Rfl: 1   rOPINIRole  (REQUIP ) 3 MG tablet, Take  1 tablet   3 x /day  as needed  for Restless Legs, Disp: 270 tablet, Rfl: 3   vitamin B-12 (CYANOCOBALAMIN ) 1000 MCG tablet, Take 1,000 mcg by mouth daily. , Disp: , Rfl:    Vitamin D , Ergocalciferol , (DRISDOL ) 1.25 MG (50000 UNIT) CAPS capsule, Take 1 capsule twice weekly for Severe Vitamin D  Deficiency                                                                               /                                                          take                                         by                                        mouth., Disp: 24 capsule, Rfl: 3  Allergies  Allergen Reactions   Codeine Other (See Comments)    puts me out of it.  Dysphoria.  Patient  tolerates Tramadol  without complications.   Flagyl  [Metronidazole ] Hives   Nabumetone Other (See Comments)    GI upset    Objective:   BP (!) 140/78 (BP Location: Right Arm, Patient Position: Sitting, Cuff Size: Normal)   Pulse (!) 51   Temp 98.5 F (36.9 C)   Ht 5' 2.75 (1.594 m)   Wt 164 lb 3.2 oz (74.5 kg)   LMP  (LMP Unknown)   SpO2 99%   BMI 29.32 kg/m       08/18/2024    9:36 AM 08/18/2024    9:23 AM 08/04/2024    8:37 AM  Vitals with BMI  Height  5' 2.75 5' 2.75  Weight  164 lbs 3 oz 160 lbs  BMI  29.31 28.57  Systolic 140 145 884  Diastolic 78 82 62  Pulse  51 52     Physical Exam Vitals and nursing note reviewed.  Constitutional:      Appearance: Normal appearance. She is normal weight.  HENT:     Head: Normocephalic and atraumatic.  Cardiovascular:     Rate and Rhythm: Normal rate and regular rhythm.     Pulses: Normal pulses.     Heart sounds: Normal heart sounds.  Pulmonary:     Effort: Pulmonary effort is normal.     Breath sounds: Normal breath sounds.  Skin:    General: Skin is warm and dry.  Neurological:     General: No focal deficit present.     Mental Status: She is alert and oriented to person, place, and time. Mental status is at baseline.  Psychiatric:        Mood and Affect: Mood normal.        Behavior: Behavior normal.        Thought Content: Thought content normal.        Judgment: Judgment normal.     Assessment & Plan:  Encounter for Medicare annual wellness exam  Need for vaccination -     Flu vaccine HIGH DOSE PF(Fluzone Trivalent)  Aortic atherosclerosis (HCC) by Abd CT scan on 08/01/2017 Assessment & Plan: On abdominal CT 08/11/2017, HTN, HLD, DM well controlled   Essential hypertension Assessment & Plan: Continue Olmesartan  20mg  daily. Counseled on importance of mediation adherence. Will monitor at home over next week and return to office for BP check in 1 week. Recommend heart healthy diet such as Mediterranean diet with whole grains, fruits, vegetable, fish, lean meats, nuts, and olive oil. Limit salt. Encouraged moderate walking, 3-5 times/week for 30-50 minutes each session. Aim for at least 150 minutes.week. Goal should be pace of 3 miles/hours, or walking 1.5 miles in 30 minutes. Avoid tobacco products. Avoid excess alcohol. Take medications as prescribed and bring medications  and blood pressure log with cuff to each office visit. Seek medical care for chest pain, palpitations, shortness of breath with exertion, dizziness/lightheadedness, vision changes, recurrent headaches, or swelling of extremities. Follow up in 3 months or sooner if needed.   Other specified hypothyroidism Assessment & Plan: Chronic on Synthroid  50mg  daily. Euthyroid on exam  The correct intake of thyroid  hormone (Levothyroxine , Synthroid ), is on empty stomach first thing in the morning, with water, separated by at least 30 minutes from breakfast and other medications,  and separated by more than 4 hours from calcium , iron, multivitamins, acid reflux medications (PPIs).   - This medication is a life-long medication and will be needed to correct thyroid  hormone imbalances for the rest of your life.  The  dose may change from time to time, based on thyroid  blood work.   - It is extremely important to be consistent taking this medication, near the same time each morning.   -AVOID TAKING PRODUCTS CONTAINING BIOTIN  (commonly found in Hair, Skin, Nails vitamins) AS IT INTERFERES WITH THE VALIDITY OF THYROID  FUNCTION BLOOD TESTS.    Type 2 diabetes mellitus with stage 2 chronic kidney disease, without long-term current use of insulin  (HCC) Assessment & Plan: A1c and uACR UTD. Foot exam UTD. Vaccines UTD. Retinal eye exam UTD. Recommend heart healthy diet such as Mediterranean diet with whole grains, fruits, vegetable, fish, lean meats, nuts, and olive oil. Limit salt. Encouraged moderate walking, 3-5 times/week for 30-50 minutes each session. Aim for at least 150 minutes.week. Goal should be pace of 3 miles/hours, or walking 1.5 miles in 30 minutes. Seek medical care for urinary frequency, extreme thirst, vision changes, lightheadedness, dizziness.  Follow up in 3 months or sooner if needed.   Hyperlipidemia LDL goal <70 Assessment & Plan: On Atorvastatin  20mg  daily without side effects. I recommend  consuming a heart healthy diet such as Mediterranean diet or DASH diet with whole grains, fruits, vegetable, fish, lean meats, nuts, and olive oil. Limit sweets and processed foods. I also encourage moderate intensity exercise 150 minutes weekly. This is 3-5 times weekly for 30-50 minutes each session. Goal should be pace of 3 miles/hours, or walking 1.5 miles in 30 minutes. 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  Jeoffrey GORMAN Barrio, FNP

## 2024-08-18 NOTE — Assessment & Plan Note (Signed)
 A1c and uACR UTD. Foot exam UTD. Vaccines UTD. Retinal eye exam UTD. Recommend heart healthy diet such as Mediterranean diet with whole grains, fruits, vegetable, fish, lean meats, nuts, and olive oil. Limit salt. Encouraged moderate walking, 3-5 times/week for 30-50 minutes each session. Aim for at least 150 minutes.week. Goal should be pace of 3 miles/hours, or walking 1.5 miles in 30 minutes. Seek medical care for urinary frequency, extreme thirst, vision changes, lightheadedness, dizziness.  Follow up in 3 months or sooner if needed.

## 2024-08-18 NOTE — Telephone Encounter (Signed)
 Tracy Paul

## 2024-08-18 NOTE — Progress Notes (Signed)
 Subjective:   Tracy Paul is a 73 y.o. female who presents for an Initial Medicare Annual Wellness Visit.  Visit Complete: In person  Patient Medicare AWV questionnaire was completed by the patient on 08/18/2024; I have confirmed that all information answered by patient is correct and no changes since this date.  Cardiac Risk Factors include: advanced age (>43men, >84 women)     Objective:    Today's Vitals   08/18/24 0923 08/18/24 0936  BP: (!) 145/82   Pulse: (!) 51   Temp: 98.5 F (36.9 C)   SpO2: 99%   Weight: 164 lb 3.2 oz (74.5 kg)   Height: 5' 2.75 (1.594 m)   PainSc: 0-No pain 0-No pain   Body mass index is 29.32 kg/m.     08/18/2024    9:44 AM 09/07/2021   10:56 AM 02/18/2021    2:08 PM 08/22/2018    9:00 AM 08/21/2018    5:00 AM 03/27/2018    2:30 PM 11/27/2013    3:27 PM  Advanced Directives  Does Patient Have a Medical Advance Directive? No No No No  No  Yes  Patient does not have advance directive   Type of Advance Directive      Living will;Healthcare Power of Attorney   Does patient want to make changes to medical advance directive?      No - Patient declined    Copy of Healthcare Power of Attorney in Chart?      No - copy requested    Would patient like information on creating a medical advance directive? Yes (Inpatient - patient requests chaplain consult to create a medical advance directive) No - Patient declined  No - Patient declined  No - Patient declined     Pre-existing out of facility DNR order (yellow form or pink MOST form)       No      Data saved with a previous flowsheet row definition    Current Medications (verified) Outpatient Encounter Medications as of 08/18/2024  Medication Sig   Ascorbic Acid (VITAMIN C) 500 MG CAPS Take 1 tablet by mouth daily.    aspirin  81 MG tablet Take 81 mg by mouth daily.   atorvastatin  (LIPITOR) 20 MG tablet TAKE 1 TABLET BY MOUTH ONCE DAILY FOR CHOLESTEROL   Calcium  Citrate-Vitamin D  (CALCIUM   CITRATE + D3 PO) Take 600 mg by mouth daily.    diphenhydramine -acetaminophen  (TYLENOL  PM) 25-500 MG TABS tablet Take 1 tablet by mouth at bedtime as needed.   escitalopram  (LEXAPRO ) 5 MG tablet Take 1 tab daily for mood.   esomeprazole  (NEXIUM ) 40 MG capsule Take 1 capsule Daily for Heartburn & Indigestion   Ferrous Sulfate  (SLOW RELEASE IRON PO) Take 1 tablet by mouth daily.    fish oil-omega-3 fatty acids 1000 MG capsule Take 1 g by mouth daily.   fluticasone  (FLONASE ) 50 MCG/ACT nasal spray Place 2 sprays into both nostrils daily as needed for allergies.   gabapentin  (NEURONTIN ) 100 MG capsule Nightly for neuropathic pain   levothyroxine  (EUTHYROX ) 50 MCG tablet Take  1 tablet  Daily  on an empty stomach with only water for 30 minutes & no Antacid meds, Calcium  or Magnesium  for 4 hours & avoid Biotin    Magnesium  250 MG TABS Take 1 tablet by mouth daily.    Melatonin 10 MG TABS Take 1 tablet by mouth at bedtime.   Naproxen Sodium (ALEVE PO) Take by mouth.   nitroGLYCERIN  (NITROSTAT ) 0.4 MG SL tablet Place  1 tablet (0.4 mg total) under the tongue every 5 (five) minutes as needed for chest pain.   olmesartan  (BENICAR ) 20 MG tablet Take 1 tablet by mouth once daily for blood pressure   potassium chloride  (K-DUR) 10 MEQ tablet Take 1 tablet 2 x /day   rOPINIRole  (REQUIP ) 3 MG tablet Take  1 tablet   3 x /day  as needed  for Restless Legs   vitamin B-12 (CYANOCOBALAMIN ) 1000 MCG tablet Take 1,000 mcg by mouth daily.    Vitamin D , Ergocalciferol , (DRISDOL ) 1.25 MG (50000 UNIT) CAPS capsule Take 1 capsule twice weekly for Severe Vitamin D  Deficiency                                                                               /                                                          take                                         by                                        mouth.   [DISCONTINUED] olmesartan -hydrochlorothiazide  (BENICAR  HCT) 20-12.5 MG tablet Take 1 tablet Daily for BP   No  facility-administered encounter medications on file as of 08/18/2024.    Allergies (verified) Codeine, Flagyl  [metronidazole ], and Nabumetone   History: Past Medical History:  Diagnosis Date   Allergic rhinitis    Arthritis    Cholecystitis, chronic    Dysrhythmia    Hx bradycardia - hx normal cardiac cath x2 / ECHO 2009 EF 60% LAE MYOVIEW  3/12 EF 60% NO ISCHEMIA   Gastrointestinal ulcer due to Helicobacter pylori 10/14/2017   Hyperlipidemia    Hypertension    no treatment after 120 lb weight loss   Hypothyroidism    Prediabetes    Thyroid  nodule    Past Surgical History:  Procedure Laterality Date   Bilateral carpal tunnel surgery     BREATH TEK H PYLORI  07/01/2012   Procedure: BREATH TEK H PYLORI;  Surgeon: Morene ONEIDA Olives, MD;  Location: [facility];  Service: General;  Laterality: N/A;   CHOLECYSTECTOMY N/A 11/27/2013   Procedure: LAPAROSCOPIC CHOLECYSTECTOMY WITH INTRAOPERATIVE CHOLANGIOGRAM;  Surgeon: Donnice KATHEE Lunger, MD;  Location: [facility];  Service: General;  Laterality: N/A;   GASTRIC ROUX-EN-Y N/A 12/16/2012   Procedure: LAPAROSCOPIC ROUX-EN-Y GASTRIC BYPASS WITH UPPER ENDOSCOPY;  Surgeon: Donnice KATHEE Lunger, MD;  Location: [facility];  Service: General;  Laterality: N/A;  Gastric Bypass   NASAL SINUS SURGERY     REPLACEMENT TOTAL KNEE BILATERAL     bilat   TUBAL LIGATION     Family History  Problem Relation Age of Onset   Breast  cancer Mother    Cancer Mother        breast cancer   Anuerysm Mother        brain anuerysm   Heart attack Father 25   Heart attack Sister 76   Breast cancer Paternal Aunt    Breast cancer Cousin    Colon cancer Neg Hx    Stomach cancer Neg Hx    Rectal cancer Neg Hx    Esophageal cancer Neg Hx    Social History   Socioeconomic History   Marital status: Married    Spouse name: Lamar   Number of children: 2   Years of education: Not on file   Highest education level: Not on file  Occupational History   Occupation: Software engineer: [employer]  Tobacco Use   Smoking status: Former    Current packs/day: 0.00    Average packs/day: 0.8 packs/day for 10.0 years (8.0 ttl pk-yrs)    Types: Cigarettes    Start date: 10/29/1969    Quit date: 10/30/1979    Years since quitting: 44.8   Smokeless tobacco: Never  Vaping Use   Vaping status: Never Used  Substance and Sexual Activity   Alcohol use: No   Drug use: No   Sexual activity: Not on file  Other Topics Concern   Not on file  Social History Narrative   Not on file   Social Drivers of Health   Financial Resource Strain: Not on file  Food Insecurity: Not on file  Transportation Needs: Not on file  Physical Activity: Not on file  Stress: Not on file  Social Connections: Not on file    Tobacco Counseling Counseling given: Not Answered   Clinical Intake:  Pre-visit preparation completed: Yes  Pain : No/denies pain Pain Score: 0-No pain     Nutritional Risks: None  How often do you need to have someone help you when you read instructions, pamphlets, or other written materials from your doctor or pharmacy?: 1 - Never What is the last grade level you completed in school?: 10 th grade  Interpreter Needed?: No  Information entered by :: Sheena P.   Activities of Daily Living    08/18/2024    9:38 AM  In your present state of health, do you have any difficulty performing the following activities:  Hearing? 0  Vision? 1  Comment wear for vision correction. will have cataract surgery on lft eye soon.  Difficulty concentrating or making decisions? 0  Walking or climbing stairs? 0  Dressing or bathing? 0  Doing errands, shopping? 0  Preparing Food and eating ? N  Using the Toilet? N  In the past six months, have you accidently leaked urine? Y  Do you have problems with loss of bowel control? N  Managing your Medications? N  Managing your Finances? N  Housekeeping or managing your Housekeeping? N    Patient Care Team: Kayla Jeoffrey RAMAN, FNP as PCP - General (Family Medicine) Lavona Agent, MD as PCP - Cardiology (Cardiology) Himmelrich, Camie RAMAN, RD (Inactive) as Dietitian [place])  Indicate any recent Medical Services you may have received from other than [facility] providers in the past year (date may be approximate).     Assessment:   This is a routine wellness examination for Parmele.  Hearing/Vision screen No results found.   Goals Addressed             This Visit's Progress    Activity and Exercise Increased  Evidence-based guidance:  Review current exercise levels.  Assess patient perspective on exercise or activity level, barriers to increasing activity, motivation and readiness for change.  Recommend or set healthy exercise goal based on individual tolerance.  Encourage small steps toward making change in amount of exercise or activity.  Urge reduction of sedentary activities or screen time.  Promote group activities within the community or with family or support person.  Consider referral to rehabiliation therapist for assessment and exercise/activity plan.   Notes: pt reports she is going to start going to the gym next week with a partner.        Depression Screen    08/18/2024    9:35 AM 08/04/2024    8:45 AM 05/18/2024    8:10 AM 02/28/2024    9:24 AM 09/07/2021   10:56 AM 05/09/2020    8:51 PM 10/08/2019   12:35 AM  PHQ 2/9 Scores  PHQ - 2 Score 1 2 0 0 0 0 0  PHQ- 9 Score 8 13 3  0       Fall Risk    08/18/2024    9:41 AM 08/04/2024    8:45 AM 05/18/2024    8:10 AM 02/28/2024    9:24 AM 09/07/2021   10:56 AM  Fall Risk   Falls in the past year? 0 0 0 0 0  Number falls in past yr: 0 0 0 0 0  Injury with Fall? 0 0 0 0 0  Risk for fall due to : No Fall Risks No Fall Risks  No Fall Risks No Fall Risks  Follow up Falls evaluation completed Falls evaluation completed  Falls evaluation completed Falls evaluation completed;Falls prevention discussed      Data saved with a  previous flowsheet row definition    MEDICARE RISK AT HOME: Medicare Risk at Home Any stairs in or around the home?: Yes (4 steps to get to the front door) If so, are there any without handrails?: Yes (no rails to the front door.) Home free of loose throw rugs in walkways, pet beds, electrical cords, etc?: No (she has heavy area rugs no fall risk) Adequate lighting in your home to reduce risk of falls?: Yes Life alert?: No (has a cell phone to call for help) Use of a cane, walker or w/c?: No Grab bars in the bathroom?: No Shower chair or bench in shower?: No Elevated toilet seat or a handicapped toilet?: Yes (elevated toilet seat.)  TIMED UP AND GO:  Was the test performed? Yes  Length of time to ambulate 10 feet: 5 sec Gait steady and fast without use of assistive device    Cognitive Function:        08/18/2024    9:43 AM  6CIT Screen  What Year? 0 points  What month? 0 points  What time? 0 points  Count back from 20 0 points  Months in reverse 0 points  Repeat phrase 0 points  Total Score 0 points    Immunizations Immunization History  Administered Date(s) Administered   INFLUENZA, HIGH DOSE SEASONAL PF 07/31/2017, 08/20/2018, 07/23/2019, 08/23/2020, 09/07/2021, 08/23/2022   Influenza Split 08/25/2013, 09/02/2014, 08/10/2015   Influenza,inj,quad, With Preservative 08/10/2016   Influenza-Unspecified 07/30/2023   Moderna Sars-Covid-2 Vaccination 12/30/2019, 01/27/2020   PPD Test 02/23/2014, 05/23/2016   Pneumococcal Conjugate-13 07/31/2018   Pneumococcal Polysaccharide-23 10/18/2008, 10/08/2019   Td 08/08/2006   Tdap 05/23/2016   Zoster Recombinant(Shingrix) 01/03/2022, 05/14/2022   Zoster, Live 10/15/2012    TDAP status: Up to  date  Flu Vaccine status: Due, Education has been provided regarding the importance of this vaccine. Advised may receive this vaccine at local pharmacy or Health Dept. Aware to provide a copy of the vaccination record if obtained from  local pharmacy or Health Dept. Verbalized acceptance and understanding.  Pneumococcal vaccine status: Up to date  Covid-19 vaccine status: Declined, Education has been provided regarding the importance of this vaccine but patient still declined. Advised may receive this vaccine at local pharmacy or Health Dept.or vaccine clinic. Aware to provide a copy of the vaccination record if obtained from local pharmacy or Health Dept. Verbalized acceptance and understanding.  Qualifies for Shingles Vaccine? Yes   Zostavax completed Yes   Shingrix Completed?: Yes  Screening Tests Health Maintenance  Topic Date Due   OPHTHALMOLOGY EXAM  01/04/2023   Influenza Vaccine  01/26/2025 (Originally 05/29/2024)   HEMOGLOBIN A1C  11/18/2024   Diabetic kidney evaluation - Urine ACR  05/18/2025   FOOT EXAM  05/18/2025   Diabetic kidney evaluation - eGFR measurement  08/04/2025   Medicare Annual Wellness (AWV)  08/18/2025   DTaP/Tdap/Td (3 - Td or Tdap) 05/23/2026   Mammogram  05/26/2026   Colonoscopy  06/05/2029   Pneumococcal Vaccine: 50+ Years  Completed   DEXA SCAN  Completed   Hepatitis C Screening  Completed   Zoster Vaccines- Shingrix  Completed   Meningococcal B Vaccine  Aged Out   COVID-19 Vaccine  Discontinued    Health Maintenance  Health Maintenance Due  Topic Date Due   OPHTHALMOLOGY EXAM  01/04/2023    Colorectal cancer screening: Type of screening: Colonoscopy. Completed 06/05/2024. Repeat every 5 years  Mammogram status: Completed 05/26/2024. Repeat every year  Bone Density status: Completed 05/18/2024. Results reflect: Bone density results: OSTEOPENIA. Repeat every 2 years.  Lung Cancer Screening: (Low Dose CT Chest recommended if Age 63-80 years, 20 pack-year currently smoking OR have quit w/in 15years.) does not qualify.   Lung Cancer Screening Referral: n/a  Additional Screening:  Hepatitis C Screening: does not qualify; Completed 12/19/2015  Vision Screening: Recommended  annual ophthalmology exams for early detection of glaucoma and other disorders of the eye. Is the patient up to date with their annual eye exam?  No  Who is the provider or what is the name of the office in which the patient attends annual eye exams? [facility]   If pt is not established with a provider, would they like to be referred to a provider to establish care? No .   Dental Screening: Recommended annual dental exams for proper oral hygiene  Diabetic Foot Exam: Diabetic Foot Exam: Completed 05/18/2024  Community Resource Referral / Chronic Care Management: CRR required this visit?  No   CCM required this visit?  No     Plan:     I have personally reviewed and noted the following in the patient's chart:   Medical and social history Use of alcohol, tobacco or illicit drugs  Current medications and supplements including opioid prescriptions. Patient is not currently taking opioid prescriptions. Functional ability and status Nutritional status Physical activity Advanced directives List of other physicians Hospitalizations, surgeries, and ER visits in previous 12 months Vitals Screenings to include cognitive, depression, and falls Referrals and appointments  In addition, I have reviewed and discussed with patient certain preventive protocols, quality metrics, and best practice recommendations. A written personalized care plan for preventive services as well as general preventive health recommendations were provided to patient.     Hospital doctor  GORMAN Barrio, FNP   08/18/2024   After Visit Summary: (In Person-Printed) AVS printed and given to the patient  Nurse Notes: n/a

## 2024-08-18 NOTE — Assessment & Plan Note (Signed)
 Chronic on Synthroid  50mg  daily. Euthyroid on exam  The correct intake of thyroid  hormone (Levothyroxine , Synthroid ), is on empty stomach first thing in the morning, with water, separated by at least 30 minutes from breakfast and other medications,  and separated by more than 4 hours from calcium , iron, multivitamins, acid reflux medications (PPIs).   - This medication is a life-long medication and will be needed to correct thyroid  hormone imbalances for the rest of your life.  The dose may change from time to time, based on thyroid  blood work.   - It is extremely important to be consistent taking this medication, near the same time each morning.   -AVOID TAKING PRODUCTS CONTAINING BIOTIN  (commonly found in Hair, Skin, Nails vitamins) AS IT INTERFERES WITH THE VALIDITY OF THYROID  FUNCTION BLOOD TESTS.

## 2024-08-25 ENCOUNTER — Ambulatory Visit

## 2024-08-25 VITALS — BP 140/78

## 2024-08-25 DIAGNOSIS — I10 Essential (primary) hypertension: Secondary | ICD-10-CM

## 2024-08-25 NOTE — Progress Notes (Signed)
 Patient is in office today for a nurse visit for Blood Pressure Check. Patient blood pressure was 140/78, Patient is asymptomatic in office.Pt brought home readings that are as follows:   Thursday, 08/20/24- 135/73 Friday, 08/21/24- 117/61 Sunday, 08/23/24 - 115/62 Monday, 08/24/24- 127/67 Tuesday, 08/25/24- 106/66, 108/67.  Pt was informed to keep her scheduled follow up appts. Home blood pressure readings and office blood pressure reading were discussed with Jeoffrey Barrio, FNP.

## 2024-08-31 ENCOUNTER — Other Ambulatory Visit

## 2024-09-03 ENCOUNTER — Ambulatory Visit: Payer: Self-pay | Admitting: Nurse Practitioner

## 2024-10-26 ENCOUNTER — Ambulatory Visit: Payer: Federal, State, Local not specified - PPO | Admitting: Internal Medicine

## 2024-11-03 ENCOUNTER — Ambulatory Visit: Payer: Federal, State, Local not specified - PPO | Admitting: Internal Medicine

## 2024-11-11 ENCOUNTER — Other Ambulatory Visit

## 2024-11-11 DIAGNOSIS — I10 Essential (primary) hypertension: Secondary | ICD-10-CM

## 2024-11-11 DIAGNOSIS — E038 Other specified hypothyroidism: Secondary | ICD-10-CM

## 2024-11-11 DIAGNOSIS — E559 Vitamin D deficiency, unspecified: Secondary | ICD-10-CM

## 2024-11-11 DIAGNOSIS — E1122 Type 2 diabetes mellitus with diabetic chronic kidney disease: Secondary | ICD-10-CM

## 2024-11-11 DIAGNOSIS — E785 Hyperlipidemia, unspecified: Secondary | ICD-10-CM

## 2024-11-12 ENCOUNTER — Ambulatory Visit: Payer: Self-pay | Admitting: Family Medicine

## 2024-11-13 LAB — CBC WITH DIFFERENTIAL/PLATELET
Absolute Lymphocytes: 2432 {cells}/uL (ref 850–3900)
Absolute Monocytes: 542 {cells}/uL (ref 200–950)
Basophils Absolute: 69 {cells}/uL (ref 0–200)
Basophils Relative: 1.1 %
Eosinophils Absolute: 599 {cells}/uL — ABNORMAL HIGH (ref 15–500)
Eosinophils Relative: 9.5 %
HCT: 35.9 % (ref 35.9–46.0)
Hemoglobin: 11.4 g/dL — ABNORMAL LOW (ref 11.7–15.5)
MCH: 29.9 pg (ref 27.0–33.0)
MCHC: 31.8 g/dL (ref 31.6–35.4)
MCV: 94.2 fL (ref 81.4–101.7)
MPV: 10.6 fL (ref 7.5–12.5)
Monocytes Relative: 8.6 %
Neutro Abs: 2659 {cells}/uL (ref 1500–7800)
Neutrophils Relative %: 42.2 %
Platelets: 277 Thousand/uL (ref 140–400)
RBC: 3.81 Million/uL (ref 3.80–5.10)
RDW: 12.5 % (ref 11.0–15.0)
Total Lymphocyte: 38.6 %
WBC: 6.3 Thousand/uL (ref 3.8–10.8)

## 2024-11-13 LAB — COMPREHENSIVE METABOLIC PANEL WITH GFR
AG Ratio: 1.6 (calc) (ref 1.0–2.5)
ALT: 21 U/L (ref 6–29)
AST: 25 U/L (ref 10–35)
Albumin: 3.9 g/dL (ref 3.6–5.1)
Alkaline phosphatase (APISO): 102 U/L (ref 37–153)
BUN: 15 mg/dL (ref 7–25)
CO2: 26 mmol/L (ref 20–32)
Calcium: 8.9 mg/dL (ref 8.6–10.4)
Chloride: 108 mmol/L (ref 98–110)
Creat: 0.82 mg/dL (ref 0.60–1.00)
Globulin: 2.4 g/dL (ref 1.9–3.7)
Glucose, Bld: 91 mg/dL (ref 65–99)
Potassium: 4.3 mmol/L (ref 3.5–5.3)
Sodium: 139 mmol/L (ref 135–146)
Total Bilirubin: 1.2 mg/dL (ref 0.2–1.2)
Total Protein: 6.3 g/dL (ref 6.1–8.1)
eGFR: 76 mL/min/1.73m2

## 2024-11-13 LAB — LIPID PANEL
Cholesterol: 129 mg/dL
HDL: 70 mg/dL
LDL Cholesterol (Calc): 46 mg/dL
Non-HDL Cholesterol (Calc): 59 mg/dL
Total CHOL/HDL Ratio: 1.8 (calc)
Triglycerides: 51 mg/dL

## 2024-11-13 LAB — HEMOGLOBIN A1C
Hgb A1c MFr Bld: 5.5 %
Mean Plasma Glucose: 111 mg/dL
eAG (mmol/L): 6.2 mmol/L

## 2024-11-13 LAB — TSH: TSH: 2.3 m[IU]/L (ref 0.40–4.50)

## 2024-11-13 LAB — MICROALBUMIN / CREATININE URINE RATIO
Creatinine, Urine: 45 mg/dL (ref 20–275)
Microalb, Ur: <0.2 mg/dL

## 2024-11-13 LAB — VITAMIN D 25 HYDROXY (VIT D DEFICIENCY, FRACTURES): Vit D, 25-Hydroxy: 71 ng/mL (ref 30–100)

## 2024-11-18 ENCOUNTER — Ambulatory Visit: Admitting: Family Medicine

## 2024-11-18 ENCOUNTER — Encounter: Payer: Self-pay | Admitting: Family Medicine

## 2024-11-18 VITALS — BP 130/77 | HR 46 | Temp 97.6°F | Ht 62.75 in | Wt 163.2 lb

## 2024-11-18 DIAGNOSIS — R7303 Prediabetes: Secondary | ICD-10-CM | POA: Diagnosis not present

## 2024-11-18 DIAGNOSIS — I700 Atherosclerosis of aorta: Secondary | ICD-10-CM

## 2024-11-18 DIAGNOSIS — E559 Vitamin D deficiency, unspecified: Secondary | ICD-10-CM

## 2024-11-18 DIAGNOSIS — D508 Other iron deficiency anemias: Secondary | ICD-10-CM

## 2024-11-18 DIAGNOSIS — E785 Hyperlipidemia, unspecified: Secondary | ICD-10-CM

## 2024-11-18 DIAGNOSIS — R001 Bradycardia, unspecified: Secondary | ICD-10-CM | POA: Diagnosis not present

## 2024-11-18 DIAGNOSIS — I10 Essential (primary) hypertension: Secondary | ICD-10-CM

## 2024-11-18 DIAGNOSIS — E038 Other specified hypothyroidism: Secondary | ICD-10-CM

## 2024-11-18 DIAGNOSIS — K9189 Other postprocedural complications and disorders of digestive system: Secondary | ICD-10-CM | POA: Diagnosis not present

## 2024-11-18 NOTE — Progress Notes (Signed)
 "  Acute Office Visit  Patient ID: Tracy Paul, female    DOB: February 26, 1951, 74 y.o.   MRN: [id number]  PCP: Kayla Jeoffrey RAMAN, FNP  Chief Complaint  Patient presents with   Medical Management of Chronic Issues    3 mo f/u     Subjective:     HPI  Tracy Paul is here today for chronic condition management and blood pressure follow-up. PMH includes HTN, DM2, RLS, aortic atherosclerosis, Vitamin D  Deficiency, IDA, and Hypothyroidism.   HTN: on Olmesartan  20mg  daily Denies chest pain, palpitations, recurrent headaches, vision changes, lightheadedness, dizziness, dyspnea on exertion, or swelling of extremities.   HLD: controlled on Atorvastatin  20mg  daily, heart healthy diet  DM2: borderline prior to bypass, not monitored, resolved with weight loss  Aortic atherosclerosis: on CT 2018   Anemia: on ferrous sulfate  daily  Hypothyroidism: euthyroid, well controlled on Synthroid  50mcg daily   Discussed the use of AI scribe software for clinical note transcription with the patient, who gave verbal consent to proceed.  History of Present Illness Tracy Paul is a 74 year old female with hypertension and aortic atherosclerosis who presents for chronic condition management.  She has chronic low heart rates, often in the high thirties, leading to symptoms such as lightheadedness and dizziness in the past. She describes feeling 'like I'm drunk' and has had instances where procedures were postponed due to her low heart rate. She monitors her blood pressure at home and reports it has been stable.  She has a history of anemia since her gastric bypass and takes slow-release iron and regular iron daily, although she will pause this for seven days due to an upcoming colonoscopy on February 4th. She frequently feels cold, which she attributes to her anemia and thyroid  issues. She is on Synthroid  50 micrograms daily for her thyroid  condition.  Her cholesterol is well-managed with atorvastatin ,  and her recent labs showed good results, including a hemoglobin A1c of 5.5. She had a gastric bypass approximately 11-12 years ago, which has helped maintain her A1c below 6.4 since 2015.  She experiences restless legs and occasional numbness and tingling, which she describes as sometimes painful. She also reports frequent muscle cramps in her legs, describing them as feeling like a 'charley horse' that 'almost feels like it's gonna turn to stone.'  She underwent cataract surgery and is currently waiting for new glasses, which have been delayed for over three weeks. This has impacted her ability to read and work.  She was hospitalized in the past due to chest pain, which was attributed to indigestion. She had a coronary artery calcium  score in 2019 and was informed of some blockages, but nothing requiring immediate intervention.   Review of Systems  All other systems reviewed and are negative.   Past Medical History:  Diagnosis Date   Allergic rhinitis    Arthritis    Cholecystitis, chronic    Dysrhythmia    Hx bradycardia - hx normal cardiac cath x2 / ECHO 2009 EF 60% LAE MYOVIEW  3/12 EF 60% NO ISCHEMIA   Gastrointestinal ulcer due to Helicobacter pylori 10/14/2017   Hyperlipidemia    Hypertension    no treatment after 120 lb weight loss   Hypothyroidism    Prediabetes    Thyroid  nodule     Past Surgical History:  Procedure Laterality Date   Bilateral carpal tunnel surgery     BREATH TEK H PYLORI  07/01/2012   Procedure: BREATH TEK H PYLORI;  Surgeon: Morene  ONEIDA Olives, MD;  Location: [facility];  Service: General;  Laterality: N/A;   CHOLECYSTECTOMY N/A 11/27/2013   Procedure: LAPAROSCOPIC CHOLECYSTECTOMY WITH INTRAOPERATIVE CHOLANGIOGRAM;  Surgeon: Donnice KATHEE Lunger, MD;  Location: [facility];  Service: General;  Laterality: N/A;   GASTRIC ROUX-EN-Y N/A 12/16/2012   Procedure: LAPAROSCOPIC ROUX-EN-Y GASTRIC BYPASS WITH UPPER ENDOSCOPY;  Surgeon: Donnice KATHEE Lunger, MD;  Location: [facility];  Service: General;  Laterality: N/A;  Gastric Bypass   NASAL SINUS SURGERY     REPLACEMENT TOTAL KNEE BILATERAL     bilat   TUBAL LIGATION      Outpatient Medications Prior to Visit  Medication Sig Dispense Refill   Ascorbic Acid (VITAMIN C) 500 MG CAPS Take 1 tablet by mouth daily.      aspirin  81 MG tablet Take 81 mg by mouth daily.     atorvastatin  (LIPITOR) 20 MG tablet TAKE 1 TABLET BY MOUTH ONCE DAILY FOR CHOLESTEROL 90 tablet 0   Azelastine-Fluticasone  137-50 MCG/ACT SUSP Place 1 spray into the nose daily as needed.     Calcium  Citrate-Vitamin D  (CALCIUM  CITRATE + D3 PO) Take 600 mg by mouth daily.      diphenhydramine -acetaminophen  (TYLENOL  PM) 25-500 MG TABS tablet Take 1 tablet by mouth at bedtime as needed.     escitalopram  (LEXAPRO ) 5 MG tablet Take 1 tab daily for mood. 90 tablet 0   esomeprazole  (NEXIUM ) 40 MG capsule Take 1 capsule Daily for Heartburn & Indigestion 90 capsule 1   Ferrous Sulfate  (SLOW RELEASE IRON PO) Take 1 tablet by mouth daily.      fish oil-omega-3 fatty acids 1000 MG capsule Take 1 g by mouth daily.     fluticasone  (FLONASE ) 50 MCG/ACT nasal spray Place 2 sprays into both nostrils daily as needed for allergies.     gabapentin  (NEURONTIN ) 100 MG capsule Nightly for neuropathic pain 90 capsule 1   levothyroxine  (EUTHYROX ) 50 MCG tablet Take  1 tablet  Daily  on an empty stomach with only water for 30 minutes & no Antacid meds, Calcium  or Magnesium  for 4 hours & avoid Biotin  90 tablet 1   Magnesium  250 MG TABS Take 1 tablet by mouth daily.      Melatonin 10 MG TABS Take 1 tablet by mouth at bedtime.     meloxicam  (MOBIC ) 15 MG tablet Take 15 mg by mouth daily.     methocarbamol (ROBAXIN) 500 MG tablet Take 500 mg by mouth every 6 (six) hours as needed for muscle spasms.     Na Sulfate-K Sulfate-Mg Sulfate concentrate (SUPREP) 17.5-3.13-1.6 GM/177ML SOLN Take 1 Bottle by mouth once.     Naproxen Sodium (ALEVE PO) Take by mouth.     nitroGLYCERIN   (NITROSTAT ) 0.4 MG SL tablet Place 1 tablet (0.4 mg total) under the tongue every 5 (five) minutes as needed for chest pain. 30 tablet 0   olmesartan  (BENICAR ) 20 MG tablet Take 1 tablet by mouth once daily for blood pressure 90 tablet 0   potassium chloride  (K-DUR) 10 MEQ tablet Take 1 tablet 2 x /day 180 tablet 1   rOPINIRole  (REQUIP ) 3 MG tablet Take  1 tablet   3 x /day  as needed  for Restless Legs 270 tablet 3   vitamin B-12 (CYANOCOBALAMIN ) 1000 MCG tablet Take 1,000 mcg by mouth daily.      Vitamin D , Ergocalciferol , (DRISDOL ) 1.25 MG (50000 UNIT) CAPS capsule Take 1 capsule twice weekly for Severe Vitamin D  Deficiency                                                                               /  take                                         by                                        mouth. 24 capsule 3   No facility-administered medications prior to visit.    Allergies[1]     Objective:    BP 130/77   Pulse (!) 46   Temp 97.6 F (36.4 C)   Ht 5' 2.75 (1.594 m)   Wt 163 lb 3.2 oz (74 kg)   LMP  (LMP Unknown)   SpO2 99%   BMI 29.14 kg/m  BP Readings from Last 3 Encounters:  11/18/24 130/77  08/25/24 (!) 140/78  08/18/24 (!) 140/78   Wt Readings from Last 3 Encounters:  11/18/24 163 lb 3.2 oz (74 kg)  08/18/24 164 lb 3.2 oz (74.5 kg)  08/04/24 160 lb 0.3 oz (72.6 kg)      Physical Exam Vitals and nursing note reviewed.  Constitutional:      Appearance: Normal appearance. She is normal weight.  HENT:     Head: Normocephalic and atraumatic.  Cardiovascular:     Rate and Rhythm: Normal rate and regular rhythm.     Pulses: Normal pulses.     Heart sounds: Normal heart sounds.  Pulmonary:     Effort: Pulmonary effort is normal.     Breath sounds: Normal breath sounds.  Skin:    General: Skin is warm and dry.  Neurological:     General: No focal deficit present.     Mental Status: She is alert and oriented  to person, place, and time. Mental status is at baseline.  Psychiatric:        Mood and Affect: Mood normal.        Behavior: Behavior normal.        Thought Content: Thought content normal.        Judgment: Judgment normal.       No results found for any visits on 11/18/24.     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 Bradycardia with occasional lightheadedness and dizziness likely related. - Continue current antihypertensive regimen. - Monitor blood pressure at home, ensuring it remains below 130/80 mmHg. - Report if blood pressure exceeds 130/80 mmHg.  Hyperlipidemia Cholesterol levels well-controlled with atorvastatin . - Continue atorvastatin  20 mg oral daily.  Other specified hypothyroidism Thyroid  levels normal with current levothyroxine  dosage. - Continue levothyroxine  50 mcg oral daily.  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 Temporarily discontinuing iron supplements for colonoscopy. - Resume iron supplementation after colonoscopy.  Bradycardia Heart rate low with occasional lightheadedness and dizziness. Symptoms concerning when symptomatic. - Continue to monitor heart rate and symptoms.  Vitamin D  deficiency Vitamin D  levels adequate with current supplementation. - Continue current vitamin D  supplementation.  Prediabetes A1c well-controlled  at 5.5%. - Continue monitoring A1c levels.  Aortic atherosclerosis No recent chest pain. Previous coronary artery calcium  score in 2019. No recent cardiology follow-up. - Continue atorvastatin  20 mg oral daily. - Ensure  cholesterol, diabetes, and blood pressure remain well-controlled.    No orders of the defined types were placed in this encounter.   Return for chronic follow-up with labs 1 week prior.  Jeoffrey GORMAN Barrio, FNP [facility]      [1]  Allergies Allergen Reactions   Codeine Other (See Comments)    puts me out of it.  Dysphoria.  Patient tolerates Tramadol  without complications.   Flagyl  [Metronidazole ] Hives   Nabumetone Other (See Comments)    GI upset   "

## 2024-12-02 ENCOUNTER — Other Ambulatory Visit: Payer: Self-pay

## 2024-12-02 ENCOUNTER — Ambulatory Visit: Admission: RE | Admit: 2024-12-02 | Source: Home / Self Care | Admitting: Internal Medicine

## 2024-12-02 ENCOUNTER — Ambulatory Visit

## 2024-12-02 ENCOUNTER — Encounter
Admission: RE | Disposition: A | Discharge status: Home / Self Care | Payer: Self-pay | Source: Home / Self Care | Attending: Internal Medicine

## 2024-12-02 ENCOUNTER — Encounter: Payer: Self-pay | Admitting: Internal Medicine

## 2024-12-02 MED ORDER — LIDOCAINE HCL (PF) 2 % IJ SOLN
INTRAMUSCULAR | Status: AC
  Filled 2024-12-02: qty 5

## 2024-12-02 MED ORDER — SODIUM CHLORIDE 0.9 % IV SOLN: INTRAVENOUS | Status: DC

## 2024-12-02 MED ORDER — PROPOFOL 10 MG/ML IV BOLUS
INTRAVENOUS | Status: AC
  Filled 2024-12-02: qty 40

## 2024-12-02 MED ORDER — GLYCOPYRROLATE 0.2 MG/ML IJ SOLN
INTRAMUSCULAR | Status: DC | PRN
  Administered 2024-12-02: .1 mg via INTRAVENOUS

## 2024-12-02 MED ORDER — PROPOFOL 10 MG/ML IV BOLUS
INTRAVENOUS | Status: DC | PRN
  Administered 2024-12-02: 30 mg via INTRAVENOUS
  Administered 2024-12-02: 70 mg via INTRAVENOUS
  Administered 2024-12-02 (×2): 30 mg via INTRAVENOUS

## 2024-12-02 MED ORDER — LIDOCAINE HCL (CARDIAC) PF 100 MG/5ML IV SOSY
PREFILLED_SYRINGE | INTRAVENOUS | Status: DC | PRN
  Administered 2024-12-02: 50 mg via INTRAVENOUS

## 2024-12-02 NOTE — Anesthesia Preprocedure Evaluation (Signed)
 "                                  Anesthesia Evaluation  Patient identified by MRN, date of birth, ID band Patient awake    Reviewed: Allergy & Precautions, H&P , NPO status , Patient's Chart, lab work & pertinent test results, reviewed documented beta blocker date and time   Airway Mallampati: II   Neck ROM: full    Dental  (+) Poor Dentition   Pulmonary neg pulmonary ROS, former smoker   Pulmonary exam normal        Cardiovascular Exercise Tolerance: Poor hypertension, On Medications + CAD  Normal cardiovascular exam+ dysrhythmias  Rhythm:regular Rate:Normal     Neuro/Psych  PSYCHIATRIC DISORDERS Anxiety Depression    negative neurological ROS     GI/Hepatic Neg liver ROS, PUD,GERD  Medicated,,  Endo/Other  Hypothyroidism    Renal/GU negative Renal ROS  negative genitourinary   Musculoskeletal   Abdominal   Peds  Hematology  (+) Blood dyscrasia, anemia   Anesthesia Other Findings Past Medical History: No date: Allergic rhinitis No date: Arthritis No date: Cholecystitis, chronic No date: Dysrhythmia     Comment:  Hx bradycardia - hx normal cardiac cath x2 / ECHO 2009               EF 60% LAE MYOVIEW  3/12 EF 60% NO ISCHEMIA 10/14/2017: Gastrointestinal ulcer due to Helicobacter pylori No date: Hyperlipidemia No date: Hypertension     Comment:  no treatment after 120 lb weight loss No date: Hypothyroidism No date: Prediabetes No date: Thyroid  nodule Past Surgical History: No date: Bilateral carpal tunnel surgery 07/01/2012: BREATH TEK H PYLORI     Comment:  Procedure: BREATH TEK H PYLORI;  Surgeon: Morene ONEIDA Olives, MD;  Location: [facility];  Service: General;              Laterality: N/A; 11/27/2013: CHOLECYSTECTOMY; N/A     Comment:  Procedure: LAPAROSCOPIC CHOLECYSTECTOMY WITH               INTRAOPERATIVE CHOLANGIOGRAM;  Surgeon: Donnice KATHEE Lunger,              MD;  Location: [facility];  Service: General;  Laterality:                N/A; 12/16/2012: GASTRIC ROUX-EN-Y; N/A     Comment:  Procedure: LAPAROSCOPIC ROUX-EN-Y GASTRIC BYPASS WITH               UPPER ENDOSCOPY;  Surgeon: Donnice KATHEE Lunger, MD;                Location: [facility];  Service: General;  Laterality: N/A;                Gastric Bypass No date: NASAL SINUS SURGERY No date: REPLACEMENT TOTAL KNEE BILATERAL     Comment:  bilat No date: TUBAL LIGATION BMI    Body Mass Index: 27.85 kg/m     Reproductive/Obstetrics negative OB ROS                              Anesthesia Physical Anesthesia Plan  ASA: 3  Anesthesia Plan: General   Post-op Pain Management:    Induction:   PONV Risk Score and Plan:  Airway Management Planned:   Additional Equipment:   Intra-op Plan:   Post-operative Plan:   Informed Consent: I have reviewed the patients History and Physical, chart, labs and discussed the procedure including the risks, benefits and alternatives for the proposed anesthesia with the patient or authorized representative who has indicated his/her understanding and acceptance.     Dental Advisory Given  Plan Discussed with: CRNA  Anesthesia Plan Comments:         Anesthesia Quick Evaluation  "

## 2024-12-02 NOTE — Op Note (Signed)
 [facility] Patient Name: Tracy Paul Procedure Date: 12/02/2024 10:52 AM MRN: [id number] Account #: [id number] Date of Birth: [birth year] Admit Type: Outpatient Age: 74 Room: [facility] ENDO ROOM 3 Gender: Female Note Status: Finalized Instrument Name: Colon Scope [id number] Procedure:             Colonoscopy Indications:           High risk colon cancer surveillance: Personal history                         of non-advanced adenoma, Chronic diarrhea Providers:             Jasher Barkan K. Aundria MD, MD Referring MD:          Jeoffrey RAMAN. Kayla (Referring MD) Medicines:             Propofol  per Anesthesia Complications:         No immediate complications. Estimated blood loss:                         Minimal. Procedure:             Pre-Anesthesia Assessment:                        - The risks and benefits of the procedure and the                         sedation options and risks were discussed with the                         patient. All questions were answered and informed                         consent was obtained.                        - Patient identification and proposed procedure were                         verified prior to the procedure by the nurse. The                         procedure was verified in the procedure room.                        - ASA Grade Assessment: III - A patient with severe                         systemic disease.                        - After reviewing the risks and benefits, the patient                         was deemed in satisfactory condition to undergo the                         procedure.                        After obtaining informed  consent, the colonoscope was                         passed under direct vision. Throughout the procedure,                         the patient's blood pressure, pulse, and oxygen                         saturations were monitored continuously. The                         Colonoscope  was introduced through the anus and                         advanced to the the cecum, identified by appendiceal                         orifice and ileocecal valve. The colonoscopy was                         somewhat difficult due to significant looping.                         Successful completion of the procedure was aided by                         straightening and shortening the scope to obtain bowel                         loop reduction. The patient tolerated the procedure                         well. The quality of the bowel preparation was                         adequate. The ileocecal valve, appendiceal orifice,                         and rectum were photographed. Findings:      The perianal and digital rectal examinations were normal. Pertinent       negatives include normal sphincter tone and no palpable rectal lesions.      Non-bleeding internal hemorrhoids were found during retroflexion. The       hemorrhoids were Grade I (internal hemorrhoids that do not prolapse).      Many large-mouthed and medium-mouthed diverticula were found in the       entire colon. There was no evidence of diverticular bleeding.      Normal mucosa was found in the entire colon. Biopsies for histology were       taken with a cold forceps from the random colon for evaluation of       microscopic colitis. Estimated blood loss was minimal.      The exam was otherwise without abnormality. Impression:            - Non-bleeding internal hemorrhoids.                        - Mild diverticulosis in the entire examined colon.  There was no evidence of diverticular bleeding.                        - Normal mucosa in the entire examined colon. Biopsied.                        - The examination was otherwise normal. Recommendation:        - Patient has a contact number available for                         emergencies. The signs and symptoms of potential                          delayed complications were discussed with the patient.                         Return to normal activities tomorrow. Written                         discharge instructions were provided to the patient.                        - Resume previous diet.                        - Continue present medications.                        - Await pathology results.                        - You do NOT require further colon cancer screening                         measures (Annual stool testing (i.e. hemoccult, FIT,                         cologuard), sigmoidoscopy, colonoscopy or CT                         colonography). You should share this recommendation                         with your Primary Care provider.                        - Follow up with Jonette Primmer, PA-C in the GI office.                         [phone]                        - Telephone GI office to schedule appointment in 3                         months.                        - The findings and recommendations were discussed with  the patient. Procedure Code(s):     --- Professional ---                        [phone], Colonoscopy, flexible; with biopsy, single or                         multiple Diagnosis Code(s):     --- Professional ---                        K57.30, Diverticulosis of large intestine without                         perforation or abscess without bleeding                        K64.0, First degree hemorrhoids                        Z86.010, Personal history of colonic polyps CPT copyright 2022 [employer]. All rights reserved. The codes documented in this report are preliminary and upon coder review may  be revised to meet current compliance requirements. Ladell MARLA Boss MD, MD 12/02/2024 11:26:29 AM This report has been signed electronically. Number of Addenda: 0 Note Initiated On: 12/02/2024 10:52 AM Scope Withdrawal Time: 0 hours 7 minutes 5 seconds  Total  Procedure Duration: 0 hours 15 minutes 58 seconds  Estimated Blood Loss:  Estimated blood loss was minimal.      [facility]

## 2024-12-02 NOTE — H&P (Signed)
 Outpatient short stay form Pre-procedure 12/02/2024 10:28 AM Tracy Paul K. Tracy Paul, M.D.  Primary Physician: Tracy Barrio, FNP  Reason for visit:  Chronic diarrhea, hx of adenomatous colon polyps  History of present illness:  Tracy Paul [facility] to the [facility] at the request of her PCP to discuss repeat colonoscopy for polyp surveillance. Last colonoscopy performed July 2019 by Dr. Shila through [facility] which removed two subcentimeter tubular adenomas. A 5-year repeat was advised. She denies any known family history of colorectal cancer or advanced adenomas. She typically has 2-3 bowel movements daily. She has chronic diarrhea at baseline. Diarrhea worsened since cholecystectomy performed 10 years ago. She typically has to have an urgent stool after lunch or dinner. She denies any episodes of fecal incontinence. She denies any issues with hematochezia or melena. She denies any abdominal pain or abdominal cramping. Appetite and diet are stable without any unintentional weight loss. She uses OTC antacids for heartburn and reflux symptoms. She had gastric bypass procedure performed in 2014. She denies any UGI concerns such as nausea, vomiting, esophageal dysphagia, odynophagia, early satiety, hoarseness, or epigastric abdominal pain. She takes care of her 2 young grandchildren most days out of the week. No other questions or concerns at this time.     Current Medications[1]  Medications Prior to Admission  Medication Sig Dispense Refill Last Dose/Taking   Ascorbic Acid (VITAMIN C) 500 MG CAPS Take 1 tablet by mouth daily.    12/01/2024   aspirin  81 MG tablet Take 81 mg by mouth daily.   12/01/2024   atorvastatin  (LIPITOR) 20 MG tablet TAKE 1 TABLET BY MOUTH ONCE DAILY FOR CHOLESTEROL 90 tablet 0 12/02/2024 at  5:30 AM   Azelastine-Fluticasone  137-50 MCG/ACT SUSP Place 1 spray into the nose daily as needed.   12/01/2024   Calcium  Citrate-Vitamin D  (CALCIUM  CITRATE + D3 PO) Take 600 mg by mouth daily.     12/01/2024   escitalopram  (LEXAPRO ) 5 MG tablet Take 1 tab daily for mood. 90 tablet 0 12/01/2024   esomeprazole  (NEXIUM ) 40 MG capsule Take 1 capsule Daily for Heartburn & Indigestion 90 capsule 1 Past Week   Ferrous Sulfate  (SLOW RELEASE IRON PO) Take 1 tablet by mouth daily.    12/01/2024   fish oil-omega-3 fatty acids 1000 MG capsule Take 1 g by mouth daily.   12/01/2024   fluticasone  (FLONASE ) 50 MCG/ACT nasal spray Place 2 sprays into both nostrils daily as needed for allergies.   12/01/2024   gabapentin  (NEURONTIN ) 100 MG capsule Nightly for neuropathic pain 90 capsule 1 12/01/2024   levothyroxine  (EUTHYROX ) 50 MCG tablet Take  1 tablet  Daily  on an empty stomach with only water for 30 minutes & no Antacid meds, Calcium  or Magnesium  for 4 hours & avoid Biotin  90 tablet 1 12/02/2024 at  5:30 AM   Magnesium  250 MG TABS Take 1 tablet by mouth daily.    12/01/2024   olmesartan  (BENICAR ) 20 MG tablet Take 1 tablet by mouth once daily for blood pressure 90 tablet 0 12/02/2024 Morning   rOPINIRole  (REQUIP ) 3 MG tablet Take  1 tablet   3 x /day  as needed  for Restless Legs 270 tablet 3 12/01/2024   vitamin B-12 (CYANOCOBALAMIN ) 1000 MCG tablet Take 1,000 mcg by mouth daily.    12/01/2024   Vitamin D , Ergocalciferol , (DRISDOL ) 1.25 MG (50000 UNIT) CAPS capsule Take 1 capsule twice weekly for Severe Vitamin D  Deficiency                                                                               /  take                                         by                                        mouth. 24 capsule 3 12/01/2024   diphenhydramine -acetaminophen  (TYLENOL  PM) 25-500 MG TABS tablet Take 1 tablet by mouth at bedtime as needed.      Melatonin 10 MG TABS Take 1 tablet by mouth at bedtime.      meloxicam  (MOBIC ) 15 MG tablet Take 15 mg by mouth daily.      methocarbamol (ROBAXIN) 500 MG tablet Take 500 mg by mouth every 6 (six) hours as needed for muscle spasms.      Na  Sulfate-K Sulfate-Mg Sulfate concentrate (SUPREP) 17.5-3.13-1.6 GM/177ML SOLN Take 1 Bottle by mouth once. (Patient not taking: Reported on 12/02/2024)   Completed Course   Naproxen Sodium (ALEVE PO) Take by mouth.      nitroGLYCERIN  (NITROSTAT ) 0.4 MG SL tablet Place 1 tablet (0.4 mg total) under the tongue every 5 (five) minutes as needed for chest pain. 30 tablet 0    potassium chloride  (K-DUR) 10 MEQ tablet Take 1 tablet 2 x /day 180 tablet 1      Allergies[2]   Past Medical History:  Diagnosis Date   Allergic rhinitis    Arthritis    Cholecystitis, chronic    Dysrhythmia    Hx bradycardia - hx normal cardiac cath x2 / ECHO 2009 EF 60% LAE MYOVIEW  3/12 EF 60% NO ISCHEMIA   Gastrointestinal ulcer due to Helicobacter pylori 10/14/2017   Hyperlipidemia    Hypertension    no treatment after 120 lb weight loss   Hypothyroidism    Prediabetes    Thyroid  nodule     Review of systems:  Otherwise negative.    Physical Exam  Gen: Alert, oriented. Appears stated age.  HEENT: [place]/AT. PERRLA. Lungs: CTA, no wheezes. CV: RR nl S1, S2. Abd: soft, benign, no masses. BS+ Ext: No edema. Pulses 2+    Planned procedures: Proceed with colonoscopy. The patient understands the nature of the planned procedure, indications, risks, alternatives and potential complications including but not limited to bleeding, infection, perforation, damage to internal organs and possible oversedation/side effects from anesthesia. The patient agrees and gives consent to proceed.  Please refer to procedure notes for findings, recommendations and patient disposition/instructions.     Tracy Paul K. Tracy Paul, M.D. Gastroenterology 12/02/2024  10:28 AM          [1]  Current Facility-Administered Medications:    0.9 %  sodium chloride  infusion, , Intravenous, Continuous, Tracy Paul, Tracy Paul K, MD, Last Rate: 20 mL/hr at 12/02/24 1023, New Bag at 12/02/24 1023 [2]  Allergies Allergen Reactions   Codeine Other (See  Comments)    puts me out of it.  Dysphoria.  Patient tolerates Tramadol  without complications.   Flagyl  [Metronidazole ] Hives   Nabumetone Other (See Comments)    GI upset

## 2024-12-02 NOTE — Anesthesia Postprocedure Evaluation (Signed)
"   Anesthesia Post Note  Patient: Tracy Paul  Procedure(s) Performed: COLONOSCOPY  Patient location during evaluation: PACU Anesthesia Type: General Level of consciousness: awake and alert Pain management: pain level controlled Vital Signs Assessment: post-procedure vital signs reviewed and stable Respiratory status: spontaneous breathing, nonlabored ventilation, respiratory function stable and patient connected to nasal cannula oxygen Cardiovascular status: blood pressure returned to baseline and stable Postop Assessment: no apparent nausea or vomiting Anesthetic complications: no   No notable events documented.   Last Vitals:  Vitals:   12/02/24 1136 12/02/24 1145  BP: 110/63 110/70  Pulse: (!) 59 (!) 55  Resp: 17 (!) 22  Temp:    SpO2: 100% 100%    Last Pain:  Vitals:   12/02/24 1136  TempSrc:   PainSc: 0-No pain                 [place] KANDICE Clause      "

## 2024-12-02 NOTE — Transfer of Care (Signed)
 Immediate Anesthesia Transfer of Care Note  Patient: Tracy Paul  Procedure(s) Performed: COLONOSCOPY  Patient Location: PACU and Endoscopy Unit  Anesthesia Type:General  Level of Consciousness: awake, alert , and oriented  Airway & Oxygen Therapy: Patient Spontanous Breathing  Post-op Assessment: Report given to RN and Post -op Vital signs reviewed and stable  Post vital signs: Reviewed and stable  Last Vitals:  Vitals Value Taken Time  BP 93/54 12/02/24 11:26  Temp    Pulse 67 12/02/24 11:27  Resp 17 12/02/24 11:27  SpO2 97 % 12/02/24 11:27  Vitals shown include unfiled device data.  Last Pain:  Vitals:   12/02/24 1019  TempSrc: Temporal  PainSc: 0-No pain         Complications: No notable events documented.

## 2024-12-02 NOTE — Interval H&P Note (Signed)
 History and Physical Interval Note:  12/02/2024 10:30 AM  Tracy Paul  has presented today for surgery, with the diagnosis of Hx of adenomatous colonic polyps (Z86.0101) Chronic diarrhea (K52.9).  The various methods of treatment have been discussed with the patient and family. After consideration of risks, benefits and other options for treatment, the patient has consented to  Procedures: COLONOSCOPY (N/A) as a surgical intervention.  The patient's history has been reviewed, patient examined, no change in status, stable for surgery.  I have reviewed the patient's chart and labs.  Questions were answered to the patient's satisfaction.     [place], Carinna Newhart

## 2024-12-02 NOTE — Interval H&P Note (Signed)
 History and Physical Interval Note:  12/02/2024 10:54 AM  Tracy Paul  has presented today for surgery, with the diagnosis of Hx of adenomatous colonic polyps (Z86.0101) Chronic diarrhea (K52.9).  The various methods of treatment have been discussed with the patient and family. After consideration of risks, benefits and other options for treatment, the patient has consented to  Procedures: COLONOSCOPY (N/A) as a surgical intervention.  The patient's history has been reviewed, patient examined, no change in status, stable for surgery.  I have reviewed the patient's chart and labs.  Questions were answered to the patient's satisfaction.     [name], [name]

## 2024-12-03 LAB — SURGICAL PATHOLOGY

## 2025-02-10 ENCOUNTER — Other Ambulatory Visit

## 2025-02-17 ENCOUNTER — Ambulatory Visit: Admitting: Family Medicine

## 2025-04-29 ENCOUNTER — Encounter

## 2025-08-19 ENCOUNTER — Ambulatory Visit
